# Patient Record
Sex: Male | Born: 1975
Health system: Southern US, Community
[De-identification: ages and names within clinical notes are randomized; demographics above are authoritative.]

## PROBLEM LIST (undated history)

## (undated) DIAGNOSIS — J45909 Unspecified asthma, uncomplicated: Secondary | ICD-10-CM

## (undated) DIAGNOSIS — N183 Chronic kidney disease, stage 3 unspecified: Secondary | ICD-10-CM

## (undated) DIAGNOSIS — D860 Sarcoidosis of lung: Secondary | ICD-10-CM

## (undated) DIAGNOSIS — D571 Sickle-cell disease without crisis: Secondary | ICD-10-CM

## (undated) DIAGNOSIS — I729 Aneurysm of unspecified site: Secondary | ICD-10-CM

## (undated) DIAGNOSIS — D869 Sarcoidosis, unspecified: Secondary | ICD-10-CM

## (undated) DIAGNOSIS — R011 Cardiac murmur, unspecified: Secondary | ICD-10-CM

## (undated) HISTORY — PX: LYMPH NODE BIOPSY: SHX201

## (undated) HISTORY — PX: NASAL SINUS SURGERY: SHX719

## (undated) HISTORY — DX: Sarcoidosis of lung: D86.0

## (undated) HISTORY — DX: Unspecified asthma, uncomplicated: J45.909

## (undated) HISTORY — DX: Chronic kidney disease, stage 3 unspecified: N18.30

## (undated) NOTE — *Deleted (*Deleted)
  Problem: Bowel/Gastric: Goal: Gut motility will be maintained Outcome: Progressing   Problem: Fluid Volume: Goal: Ability to maintain a balanced intake and output will improve Outcome: Progressing   

---

## 2006-07-08 ENCOUNTER — Emergency Department (HOSPITAL_COMMUNITY): Admission: AD | Admit: 2006-07-08 | Discharge: 2006-07-08 | Payer: Self-pay | Admitting: Family Medicine

## 2008-07-21 ENCOUNTER — Emergency Department (HOSPITAL_COMMUNITY): Admission: EM | Admit: 2008-07-21 | Discharge: 2008-07-21 | Payer: Self-pay | Admitting: Family Medicine

## 2010-10-25 ENCOUNTER — Observation Stay (HOSPITAL_COMMUNITY)
Admission: EM | Admit: 2010-10-25 | Discharge: 2010-10-26 | Disposition: A | Discharge status: Home / Self Care | Payer: Medicare Other | Attending: Emergency Medicine | Admitting: Emergency Medicine

## 2010-10-25 DIAGNOSIS — D5700 Hb-SS disease with crisis, unspecified: Principal | ICD-10-CM | POA: Insufficient documentation

## 2010-12-15 LAB — CBC
HCT: 21.1 % — ABNORMAL LOW (ref 39.0–52.0)
Platelets: 427 10*3/uL — ABNORMAL HIGH (ref 150–400)
RBC: 2.58 MIL/uL — ABNORMAL LOW (ref 4.22–5.81)
RDW: 21 % — ABNORMAL HIGH (ref 11.5–15.5)

## 2010-12-15 LAB — RETICULOCYTES
RBC.: 2.58 MIL/uL — ABNORMAL LOW (ref 4.22–5.81)
Retic Count, Absolute: 206.4 10*3/uL — ABNORMAL HIGH (ref 19.0–186.0)

## 2010-12-15 LAB — BASIC METABOLIC PANEL
Calcium: 8.8 mg/dL (ref 8.4–10.5)
Chloride: 105 mEq/L (ref 96–112)
Sodium: 138 mEq/L (ref 135–145)

## 2010-12-15 LAB — DIFFERENTIAL
Basophils Absolute: 0.1 10*3/uL (ref 0.0–0.1)
Lymphocytes Relative: 27 % (ref 12–46)
Lymphs Abs: 2.9 10*3/uL (ref 0.7–4.0)
Neutro Abs: 5.8 10*3/uL (ref 1.7–7.7)

## 2011-09-26 ENCOUNTER — Emergency Department (INDEPENDENT_AMBULATORY_CARE_PROVIDER_SITE_OTHER): Payer: Medicare Other

## 2011-09-26 ENCOUNTER — Emergency Department (INDEPENDENT_AMBULATORY_CARE_PROVIDER_SITE_OTHER)
Admission: EM | Admit: 2011-09-26 | Discharge: 2011-09-26 | Disposition: A | Discharge status: Home / Self Care | Payer: Medicare Other | Source: Home / Self Care | Attending: Family Medicine | Admitting: Family Medicine

## 2011-09-26 ENCOUNTER — Encounter: Payer: Self-pay | Admitting: *Deleted

## 2011-09-26 DIAGNOSIS — M79609 Pain in unspecified limb: Secondary | ICD-10-CM

## 2011-09-26 DIAGNOSIS — M79669 Pain in unspecified lower leg: Secondary | ICD-10-CM

## 2011-09-26 HISTORY — DX: Sickle-cell disease without crisis: D57.1

## 2011-09-26 MED ORDER — DICLOFENAC POTASSIUM 50 MG PO TABS: 50.0000 mg | ORAL_TABLET | Freq: Three times a day (TID) | ORAL | Status: AC

## 2011-09-26 NOTE — ED Provider Notes (Signed)
History     CSN: [id number]  Arrival date & time 09/26/11  1134   First MD Initiated Contact with Patient 09/26/11 1428      Chief Complaint  Patient presents with  . Leg Pain    (Consider location/radiation/quality/duration/timing/severity/associated sxs/prior treatment) Patient is a 35 y.o. male presenting with leg pain. The history is provided by the patient.  Leg Pain  The incident occurred more than 2 days ago. The incident occurred at home. There was no injury mechanism (pt has sickle cell diseae and cerebral aneurysm.). The pain is present in the right leg. The pain is moderate. The pain has been constant since onset. The symptoms are aggravated by nothing.    Past Medical History  Diagnosis Date  . Sickle cell anemia     History reviewed. No pertinent past surgical history.  History reviewed. No pertinent family history.  History  Substance Use Topics  . Smoking status: Not on file  . Smokeless tobacco: Not on file  . Alcohol Use:       Review of Systems  Constitutional: Negative.   Musculoskeletal: Positive for myalgias. Negative for joint swelling.  Skin: Negative for color change, pallor and rash.    Allergies  Review of patient's allergies indicates no known allergies.  Home Medications   Current Outpatient Rx  Name Route Sig Dispense Refill  . FOLIC ACID 1 MG PO TABS Oral Take 1 mg by mouth daily.      Marland Kitchen HYDROXYUREA 500 MG PO CAPS Oral Take 500 mg by mouth daily. May take with food to minimize GI side effects.     . OXYCODONE-ACETAMINOPHEN 5-325 MG PO TABS Oral Take 1 tablet by mouth every 4 (four) hours as needed.      Marland Kitchen DICLOFENAC POTASSIUM 50 MG PO TABS Oral Take 1 tablet (50 mg total) by mouth 3 (three) times daily. 30 tablet 1    BP 123/60  Pulse 59  Temp(Src) 98.1 F (36.7 C) (Oral)  Resp 20  SpO2 98%  Physical Exam  Nursing note and vitals reviewed. Constitutional: He is oriented to person, place, and time. He appears  well-developed and well-nourished.  HENT:  Head: Normocephalic.  Musculoskeletal: Normal range of motion. He exhibits tenderness.  Neurological: He is alert and oriented to person, place, and time.  Skin: Skin is warm and dry.  Psychiatric: He has a normal mood and affect.    ED Course  Procedures (including critical care time)  Labs Reviewed - No data to display Dg Tibia/fibula Right  09/26/2011  *RADIOLOGY REPORT*  Clinical Data: Pain, no known injury  RIGHT TIBIA AND FIBULA - 2 VIEW  Comparison: None.  Findings: Two views of the right tibia-fibula submitted.  No acute fracture or subluxation.  No periosteal reaction or bony erosion. No radiopaque foreign body.  IMPRESSION: No acute fracture or subluxation.  Original Report Authenticated By: Natasha Mead, M.D.     1. Lower leg pain       MDM  X-rays reviewed and report per radiologist.         Barkley Bruns, MD 09/26/11 [phone]

## 2011-09-26 NOTE — ED Notes (Signed)
Pt  Has  Sickle  Cell  He  Takes oxycodone    He  Reports  Pain r  Lower  Leg   X  4  Days  He  Is  Sitting  Comfortably on  Exam table  He  denys  Any  Injury

## 2013-10-11 DIAGNOSIS — R9431 Abnormal electrocardiogram [ECG] [EKG]: Secondary | ICD-10-CM | POA: Diagnosis not present

## 2013-11-01 DIAGNOSIS — D571 Sickle-cell disease without crisis: Secondary | ICD-10-CM | POA: Diagnosis not present

## 2013-11-01 DIAGNOSIS — I729 Aneurysm of unspecified site: Secondary | ICD-10-CM | POA: Diagnosis not present

## 2013-11-01 DIAGNOSIS — R17 Unspecified jaundice: Secondary | ICD-10-CM | POA: Diagnosis not present

## 2013-12-09 DIAGNOSIS — D5700 Hb-SS disease with crisis, unspecified: Secondary | ICD-10-CM | POA: Diagnosis not present

## 2013-12-09 DIAGNOSIS — D571 Sickle-cell disease without crisis: Secondary | ICD-10-CM | POA: Diagnosis not present

## 2014-02-25 DIAGNOSIS — Z79899 Other long term (current) drug therapy: Secondary | ICD-10-CM | POA: Diagnosis not present

## 2014-02-25 DIAGNOSIS — G43719 Chronic migraine without aura, intractable, without status migrainosus: Secondary | ICD-10-CM | POA: Diagnosis not present

## 2014-02-25 DIAGNOSIS — Z049 Encounter for examination and observation for unspecified reason: Secondary | ICD-10-CM | POA: Diagnosis not present

## 2014-03-04 DIAGNOSIS — L97909 Non-pressure chronic ulcer of unspecified part of unspecified lower leg with unspecified severity: Secondary | ICD-10-CM | POA: Diagnosis not present

## 2014-03-04 DIAGNOSIS — L678 Other hair color and hair shaft abnormalities: Secondary | ICD-10-CM | POA: Diagnosis not present

## 2014-03-04 DIAGNOSIS — D571 Sickle-cell disease without crisis: Secondary | ICD-10-CM | POA: Diagnosis not present

## 2014-03-04 DIAGNOSIS — L738 Other specified follicular disorders: Secondary | ICD-10-CM | POA: Diagnosis not present

## 2014-03-04 DIAGNOSIS — D5700 Hb-SS disease with crisis, unspecified: Secondary | ICD-10-CM | POA: Diagnosis not present

## 2014-03-04 DIAGNOSIS — G8929 Other chronic pain: Secondary | ICD-10-CM | POA: Diagnosis not present

## 2014-04-21 DIAGNOSIS — M549 Dorsalgia, unspecified: Secondary | ICD-10-CM | POA: Diagnosis not present

## 2014-04-21 DIAGNOSIS — R599 Enlarged lymph nodes, unspecified: Secondary | ICD-10-CM | POA: Diagnosis not present

## 2014-04-21 DIAGNOSIS — D571 Sickle-cell disease without crisis: Secondary | ICD-10-CM | POA: Diagnosis not present

## 2014-04-21 DIAGNOSIS — R944 Abnormal results of kidney function studies: Secondary | ICD-10-CM | POA: Diagnosis not present

## 2014-04-21 DIAGNOSIS — G8929 Other chronic pain: Secondary | ICD-10-CM | POA: Diagnosis not present

## 2014-04-21 DIAGNOSIS — R350 Frequency of micturition: Secondary | ICD-10-CM | POA: Diagnosis not present

## 2014-04-21 DIAGNOSIS — I517 Cardiomegaly: Secondary | ICD-10-CM | POA: Diagnosis not present

## 2014-04-21 DIAGNOSIS — E559 Vitamin D deficiency, unspecified: Secondary | ICD-10-CM | POA: Diagnosis not present

## 2014-04-21 DIAGNOSIS — J329 Chronic sinusitis, unspecified: Secondary | ICD-10-CM | POA: Diagnosis not present

## 2014-04-21 DIAGNOSIS — R51 Headache: Secondary | ICD-10-CM | POA: Diagnosis not present

## 2014-04-23 DIAGNOSIS — D571 Sickle-cell disease without crisis: Secondary | ICD-10-CM | POA: Diagnosis not present

## 2014-04-23 DIAGNOSIS — G8929 Other chronic pain: Secondary | ICD-10-CM | POA: Diagnosis not present

## 2014-04-23 DIAGNOSIS — L97909 Non-pressure chronic ulcer of unspecified part of unspecified lower leg with unspecified severity: Secondary | ICD-10-CM | POA: Diagnosis not present

## 2014-04-25 DIAGNOSIS — IMO0001 Reserved for inherently not codable concepts without codable children: Secondary | ICD-10-CM | POA: Diagnosis not present

## 2014-04-25 DIAGNOSIS — D571 Sickle-cell disease without crisis: Secondary | ICD-10-CM | POA: Diagnosis not present

## 2014-04-25 DIAGNOSIS — L97909 Non-pressure chronic ulcer of unspecified part of unspecified lower leg with unspecified severity: Secondary | ICD-10-CM | POA: Diagnosis not present

## 2014-04-25 DIAGNOSIS — D649 Anemia, unspecified: Secondary | ICD-10-CM | POA: Diagnosis not present

## 2014-04-25 DIAGNOSIS — G8929 Other chronic pain: Secondary | ICD-10-CM | POA: Diagnosis not present

## 2014-05-30 ENCOUNTER — Encounter (HOSPITAL_COMMUNITY): Payer: Self-pay | Admitting: Emergency Medicine

## 2014-05-30 ENCOUNTER — Emergency Department (HOSPITAL_COMMUNITY): Payer: Medicare Other

## 2014-05-30 ENCOUNTER — Inpatient Hospital Stay (HOSPITAL_COMMUNITY)
Admission: EM | Admit: 2014-05-30 | Discharge: 2014-06-01 | DRG: 812 | Disposition: A | Discharge status: Home / Self Care | Payer: Medicare Other | Attending: Internal Medicine | Admitting: Internal Medicine

## 2014-05-30 DIAGNOSIS — N179 Acute kidney failure, unspecified: Secondary | ICD-10-CM | POA: Diagnosis present

## 2014-05-30 DIAGNOSIS — R0602 Shortness of breath: Secondary | ICD-10-CM | POA: Diagnosis not present

## 2014-05-30 DIAGNOSIS — D5700 Hb-SS disease with crisis, unspecified: Secondary | ICD-10-CM

## 2014-05-30 DIAGNOSIS — D571 Sickle-cell disease without crisis: Secondary | ICD-10-CM | POA: Diagnosis not present

## 2014-05-30 DIAGNOSIS — G8929 Other chronic pain: Secondary | ICD-10-CM | POA: Diagnosis not present

## 2014-05-30 DIAGNOSIS — D589 Hereditary hemolytic anemia, unspecified: Secondary | ICD-10-CM

## 2014-05-30 DIAGNOSIS — R51 Headache: Secondary | ICD-10-CM | POA: Diagnosis not present

## 2014-05-30 DIAGNOSIS — N183 Chronic kidney disease, stage 3 unspecified: Secondary | ICD-10-CM | POA: Diagnosis not present

## 2014-05-30 DIAGNOSIS — D638 Anemia in other chronic diseases classified elsewhere: Secondary | ICD-10-CM

## 2014-05-30 DIAGNOSIS — Z79899 Other long term (current) drug therapy: Secondary | ICD-10-CM

## 2014-05-30 DIAGNOSIS — N189 Chronic kidney disease, unspecified: Secondary | ICD-10-CM | POA: Diagnosis present

## 2014-05-30 DIAGNOSIS — D649 Anemia, unspecified: Secondary | ICD-10-CM | POA: Diagnosis present

## 2014-05-30 LAB — COMPREHENSIVE METABOLIC PANEL WITH GFR
ALT: 40 U/L (ref 0–53)
AST: 100 U/L — ABNORMAL HIGH (ref 0–37)
Albumin: 3.5 g/dL (ref 3.5–5.2)
Alkaline Phosphatase: 146 U/L — ABNORMAL HIGH (ref 39–117)
Anion gap: 14 (ref 5–15)
BUN: 30 mg/dL — ABNORMAL HIGH (ref 6–23)
CO2: 20 meq/L (ref 19–32)
Calcium: 12.1 mg/dL — ABNORMAL HIGH (ref 8.4–10.5)
Chloride: 103 meq/L (ref 96–112)
Creatinine, Ser: 2.33 mg/dL — ABNORMAL HIGH (ref 0.50–1.35)
GFR calc Af Amer: 39 mL/min — ABNORMAL LOW (ref >90)
GFR calc non Af Amer: 34 mL/min — ABNORMAL LOW (ref >90)
Glucose, Bld: 97 mg/dL (ref 70–99)
Potassium: 3.9 meq/L (ref 3.7–5.3)
Sodium: 137 meq/L (ref 137–147)
Total Bilirubin: 3.1 mg/dL — ABNORMAL HIGH (ref 0.3–1.2)
Total Protein: 7.3 g/dL (ref 6.0–8.3)

## 2014-05-30 LAB — CBC
HCT: 14.5 % — ABNORMAL LOW (ref 39.0–52.0)
Hemoglobin: 4.8 g/dL — CL (ref 13.0–17.0)
MCH: 30.8 pg (ref 26.0–34.0)
MCHC: 33.1 g/dL (ref 30.0–36.0)
MCV: 92.9 fL (ref 78.0–100.0)
Platelets: 490 K/uL — ABNORMAL HIGH (ref 150–400)
RBC: 1.56 MIL/uL — ABNORMAL LOW (ref 4.22–5.81)
RDW: 22 % — ABNORMAL HIGH (ref 11.5–15.5)
WBC: 13.1 K/uL — ABNORMAL HIGH (ref 4.0–10.5)

## 2014-05-30 LAB — RETICULOCYTES
RBC.: 1.56 MIL/uL — ABNORMAL LOW (ref 4.22–5.81)
Retic Ct Pct: >23 % — ABNORMAL HIGH (ref 0.4–3.1)

## 2014-05-30 NOTE — ED Provider Notes (Signed)
CSN: [id number]     Arrival date & time 05/30/14  1808 History   First MD Initiated Contact with Patient 05/30/14 2133     Chief Complaint  Patient presents with  . Headache     (Consider location/radiation/quality/duration/timing/severity/associated sxs/prior Treatment) HPI Comments: He reports constant headache for the past year that has gotten worse in the last 3-4 months.  He reports having sickle cell disease which he is treated for at [facility]; No PCP.  He describes the headache as achiness on the top of his head.  The headache waxes and wanes in nature daily, but never fully resolves.  He takes medications daily for his sickle cell pain, as well as headaches: Percocet 6 daily; Excedrin,tylenol, Aleve. Additionaly he report being told he had a benign brain aneurysm in 2011. He denies any vision changes, weakness, facial weakness or speech difficulties. He reports this HA is similar to previous HAs but is getting worse.  Additionally, he endorses shortness of breath with walking which has been present for several months, as well as pain underneath his shoulders, and scapula.  He denies current pain crises.      Patient is a 38 y.o. male presenting with headaches.  Headache Pain location:  Generalized Quality: achy/sore. Radiates to:  Does not radiate Severity currently:  4/10 Severity at highest:  10/10 Timing:  Constant Progression:  Waxing and waning Chronicity:  Chronic Similar to prior headaches: yes   Context: activity, bright light and loud noise   Context: not caffeine   Relieved by:  Acetaminophen and NSAIDs (Percocet - 6 a day) Ineffective treatments:  NSAIDs and acetaminophen (excedrin) Associated symptoms: fatigue and weakness   Associated symptoms: no abdominal pain, no back pain, no blurred vision, no diarrhea, no dizziness, no nausea, no URI and no vomiting     Past Medical History  Diagnosis Date  . Sickle cell anemia    History reviewed. No pertinent past  surgical history. No family history on file. History  Substance Use Topics  . Smoking status: Never Smoker   . Smokeless tobacco: Not on file  . Alcohol Use: No    Review of Systems  Constitutional: Positive for fatigue.  Eyes: Negative for blurred vision.  Gastrointestinal: Negative for nausea, vomiting, abdominal pain and diarrhea.  Musculoskeletal: Negative for back pain.  Neurological: Positive for headaches. Negative for dizziness.    Allergies  Review of patient's allergies indicates no known allergies.  Home Medications   Prior to Admission medications   Medication Sig Start Date End Date Taking? Authorizing Provider  folic acid (FOLVITE) 1 MG tablet Take 1 mg by mouth daily.      Historical Provider, MD  hydroxyurea (HYDREA) 500 MG capsule Take 500 mg by mouth daily. May take with food to minimize GI side effects.     Historical Provider, MD  oxyCODONE-acetaminophen (PERCOCET) 5-325 MG per tablet Take 1 tablet by mouth every 4 (four) hours as needed.      Historical Provider, MD   BP 125/60  Pulse 88  Temp(Src) 98.2 F (36.8 C) (Oral)  Resp 16  Ht 6' (1.829 m)  Wt 136 lb (61.689 kg)  BMI 18.44 kg/m2  SpO2 92% Physical Exam  ED Course  Procedures (including critical care time) Labs Review Labs Reviewed  CBC - Abnormal; Notable for the following:    WBC 13.1 (*)    RBC 1.56 (*)    Hemoglobin 4.8 (*)    HCT 14.5 (*)    RDW 22.0 (*)  Platelets 490 (*)    All other components within normal limits  COMPREHENSIVE METABOLIC PANEL - Abnormal; Notable for the following:    BUN 30 (*)    Creatinine, Ser 2.33 (*)    Calcium 12.1 (*)    AST 100 (*)    Alkaline Phosphatase 146 (*)    Total Bilirubin 3.1 (*)    GFR calc non Af Amer 34 (*)    GFR calc Af Amer 39 (*)    All other components within normal limits  RETICULOCYTES - Abnormal; Notable for the following:    Retic Ct Pct >23.0 (*)    RBC. 1.56 (*)    All other components within normal limits  TYPE  AND SCREEN  PREPARE RBC (CROSSMATCH)    Imaging Review Dg Chest 2 View  05/31/2014   CLINICAL DATA:  Headaches for 3 months. Shortness of breath. Sickle cell anemia.  EXAM: CHEST  2 VIEW  COMPARISON:  09/03/2013  FINDINGS: Right paratracheal and bilateral hilar prominence suggesting mediastinal/hilar lymphadenopathy. Appearance is similar to previous study. Mild cardiac enlargement. No focal consolidation or airspace disease. No blunting of costophrenic angles. No pneumothorax.  IMPRESSION: Bilateral hilar and mediastinal lymphadenopathy is again demonstrated, similar to prior study. Cardiac enlargement. No focal consolidation.   Electronically Signed   By: [name] M.D.   On: 05/31/2014 00:01     EKG Interpretation None      MDM   Final diagnoses:  Hb-SS disease without crisis  Anemia in other chronic diseases classified elsewhere  CKD (chronic kidney disease), stage 3 (moderate)  Chronic intractable headache, unspecified headache type   Presented with chronic daily headache without neurological signs/symptoms as well as Hx of sickle cell anemia with subscapular pain and worsen SOB. CXR not concerning for Acute chest but demonstrating chronic hilar and mediastinal lymphadenopathy. Severe symptomatic anemia with Hgb 4.8 and high retic. Will admit for blood transfusion and further evaluation of anemia.     Jamal Collin, MD 05/31/14 [phone]

## 2014-05-30 NOTE — ED Notes (Signed)
Report received from [place], [place].  Pt in radiology.

## 2014-05-30 NOTE — ED Notes (Signed)
The pt  Is c/o a headache for 3 months.  He has been seen numerus times for the same and he is still having the headaches

## 2014-05-31 ENCOUNTER — Inpatient Hospital Stay (HOSPITAL_COMMUNITY): Payer: Medicare Other

## 2014-05-31 DIAGNOSIS — D571 Sickle-cell disease without crisis: Secondary | ICD-10-CM | POA: Diagnosis not present

## 2014-05-31 DIAGNOSIS — D649 Anemia, unspecified: Secondary | ICD-10-CM | POA: Diagnosis not present

## 2014-05-31 DIAGNOSIS — D599 Acquired hemolytic anemia, unspecified: Secondary | ICD-10-CM | POA: Diagnosis not present

## 2014-05-31 DIAGNOSIS — N189 Chronic kidney disease, unspecified: Secondary | ICD-10-CM | POA: Diagnosis present

## 2014-05-31 DIAGNOSIS — N183 Chronic kidney disease, stage 3 unspecified: Secondary | ICD-10-CM | POA: Diagnosis not present

## 2014-05-31 DIAGNOSIS — N179 Acute kidney failure, unspecified: Secondary | ICD-10-CM | POA: Diagnosis present

## 2014-05-31 DIAGNOSIS — R51 Headache: Secondary | ICD-10-CM | POA: Diagnosis not present

## 2014-05-31 DIAGNOSIS — Z79899 Other long term (current) drug therapy: Secondary | ICD-10-CM | POA: Diagnosis not present

## 2014-05-31 DIAGNOSIS — R0602 Shortness of breath: Secondary | ICD-10-CM | POA: Diagnosis not present

## 2014-05-31 DIAGNOSIS — R079 Chest pain, unspecified: Secondary | ICD-10-CM | POA: Diagnosis not present

## 2014-05-31 LAB — URINALYSIS, ROUTINE W REFLEX MICROSCOPIC
Bilirubin Urine: NEGATIVE
Glucose, UA: NEGATIVE mg/dL
KETONES UR: NEGATIVE mg/dL
NITRITE: NEGATIVE
PH: 6.5 (ref 5.0–8.0)
Protein, ur: NEGATIVE mg/dL
Specific Gravity, Urine: 1.009 (ref 1.005–1.030)
Urobilinogen, UA: 1 mg/dL (ref 0.0–1.0)

## 2014-05-31 LAB — CBC
HCT: 19.4 % — ABNORMAL LOW (ref 39.0–52.0)
HEMOGLOBIN: 6.9 g/dL — AB (ref 13.0–17.0)
MCH: 29.7 pg (ref 26.0–34.0)
MCHC: 35.6 g/dL (ref 30.0–36.0)
MCV: 83.6 fL (ref 78.0–100.0)
Platelets: 468 10*3/uL — ABNORMAL HIGH (ref 150–400)
RBC: 2.32 MIL/uL — AB (ref 4.22–5.81)
RDW: 18.8 % — ABNORMAL HIGH (ref 11.5–15.5)
WBC: 10.8 10*3/uL — AB (ref 4.0–10.5)

## 2014-05-31 LAB — BASIC METABOLIC PANEL
Anion gap: 13 (ref 5–15)
BUN: 25 mg/dL — ABNORMAL HIGH (ref 6–23)
CHLORIDE: 104 meq/L (ref 96–112)
CO2: 21 mEq/L (ref 19–32)
Calcium: 11.9 mg/dL — ABNORMAL HIGH (ref 8.4–10.5)
Creatinine, Ser: 2.25 mg/dL — ABNORMAL HIGH (ref 0.50–1.35)
GFR calc Af Amer: 41 mL/min — ABNORMAL LOW (ref >90)
GFR, EST NON AFRICAN AMERICAN: 35 mL/min — AB (ref >90)
GLUCOSE: 98 mg/dL (ref 70–99)
POTASSIUM: 4.1 meq/L (ref 3.7–5.3)
SODIUM: 138 meq/L (ref 137–147)

## 2014-05-31 LAB — URINE MICROSCOPIC-ADD ON

## 2014-05-31 LAB — PREPARE RBC (CROSSMATCH)

## 2014-05-31 MED ORDER — OXYCODONE-ACETAMINOPHEN 5-325 MG PO TABS
1.0000 | ORAL_TABLET | ORAL | Status: DC | PRN
  Administered 2014-05-31 (×3): 1 via ORAL
  Filled 2014-05-31 (×3): qty 1

## 2014-05-31 MED ORDER — SODIUM CHLORIDE 0.9 % IJ SOLN
3.0000 mL | Freq: Two times a day (BID) | INTRAMUSCULAR | Status: DC
  Administered 2014-05-31 (×2): 3 mL via INTRAVENOUS

## 2014-05-31 MED ORDER — SODIUM CHLORIDE 0.45 % IV SOLN
INTRAVENOUS | Status: AC
  Administered 2014-05-31 – 2014-06-01 (×2): via INTRAVENOUS

## 2014-05-31 MED ORDER — HYDROMORPHONE HCL PF 1 MG/ML IJ SOLN
0.5000 mg | INTRAMUSCULAR | Status: DC | PRN
  Administered 2014-05-31: 0.5 mg via INTRAVENOUS
  Filled 2014-05-31: qty 1

## 2014-05-31 MED ORDER — SODIUM CHLORIDE 0.9 % IV SOLN
Freq: Once | INTRAVENOUS | Status: AC
  Administered 2014-05-31: 01:00:00 via INTRAVENOUS

## 2014-05-31 MED ORDER — CETYLPYRIDINIUM CHLORIDE 0.05 % MT LIQD
7.0000 mL | Freq: Two times a day (BID) | OROMUCOSAL | Status: DC
  Administered 2014-05-31 (×2): 7 mL via OROMUCOSAL

## 2014-05-31 MED ORDER — HEPARIN SODIUM (PORCINE) 5000 UNIT/ML IJ SOLN
5000.0000 [IU] | Freq: Three times a day (TID) | INTRAMUSCULAR | Status: DC
  Administered 2014-05-31 – 2014-06-01 (×4): 5000 [IU] via SUBCUTANEOUS
  Filled 2014-05-31 (×7): qty 1

## 2014-05-31 NOTE — Plan of Care (Signed)
Problem: Phase I Progression Outcomes Goal: OOB as tolerated unless otherwise ordered Outcome: Completed/Met Date Met:  05/31/14 Pt oob to use urinal. Continue to monitor.

## 2014-05-31 NOTE — Progress Notes (Signed)
Pt ordered to transfer to Sickle Cell unit at [facility]. Bed received for 3W (1331). Report called to [place]. [facility] notified for transfer. Hortencia Conradi, RN

## 2014-05-31 NOTE — Care Management Note (Signed)
    Page 1 of 1   05/31/2014     11:48:58 AM CARE MANAGEMENT NOTE 05/31/2014  Patient:  TRUEMAN, WORLDS   Account Number:  [id number]  Date Initiated:  05/31/2014  Documentation initiated by:  [facility]  Subjective/Objective Assessment:   adm: HGB-SS disease     Action/Plan:   transfer to [facility]   Anticipated DC Date:     Anticipated DC Plan:           Choice offered to / List presented to:             Status of service:  Completed, signed off Medicare Important Message given?   (If response is "NO", the following Medicare IM given date fields will be blank) Date Medicare IM given:   Medicare IM given by:   Date Additional Medicare IM given:   Additional Medicare IM given by:    Discharge Disposition:    Per UR Regulation:    If discussed at Long Length of Stay Meetings, dates discussed:    Comments:  05/31/14 11:35 CM received call from RN as pt is traNSFERING TO [facility].  CM advised RN to call report to receiving RN.  and then to call [facility] to arrange transport.  CM gave RN Care demographic info.  No other CM needs were communicated.  Freddy Jaksch, BSN, CM [phone].

## 2014-05-31 NOTE — ED Provider Notes (Signed)
I saw and evaluated the patient, reviewed the resident's note and I agree with the findings and plan.   EKG Interpretation None        CRITICAL CARE Performed by: Pricilla Loveless T   Total critical care time: 30 minutes  Critical care time was exclusive of separately billable procedures and treating other patients.  Critical care was necessary to treat or prevent imminent or life-threatening deterioration.  Critical care was time spent personally by me on the following activities: development of treatment plan with patient and/or surrogate as well as nursing, discussions with consultants, evaluation of patient's response to treatment, examination of patient, obtaining history from patient or surrogate, ordering and performing treatments and interventions, ordering and review of laboratory studies, ordering and review of radiographic studies, pulse oximetry and re-evaluation of patient's condition.    Patient's headache has been daily, intermittent and for months. No acute onset or acute worsening concerning for [facility]. Normal exam, no neuro symptoms. Is fatigued, and hgb 4.8 here, will order transfusion and admit to hospitalist.  Audree Camel, MD 05/31/14 [phone]

## 2014-05-31 NOTE — Plan of Care (Signed)
Problem: Phase I Progression Outcomes Goal: Voiding-avoid urinary catheter unless indicated Outcome: Completed/Met Date Met:  05/31/14 Pt voided 925 cc clear yellow urine. No reported problems.

## 2014-05-31 NOTE — H&P (Addendum)
[facility] History and Physical  Fran Mcree RUE:[id number] DOB: [birth year] DOA: 05/30/2014  Referring physician: EDP PCP: No primary provider on file.   Chief Complaint: Headache   HPI: Shawn Adams is a 38 y.o. male with h/o HGB-SS disease, follows with hematology at [facility].  He presents to ED with chronic headache for past year that has worsened in last 3-4 months.  Headache is daily and does not improve with NSAIDS.  Over the last couple of weeks he has also had generalized weakness, malaise, fatigue.  No chest pain, no acute pain crisis he says with his sickle cell disease, just severe fatigue.  Work up in the ED is remarkable for HGB of 4.8 today and AKI with creatinine of 2.3.  Of note he just had a similar presentation with HGB of 5.5 and creatinine of 2.1 on 7/27 requiring transfusion.  He had been on monthly-transfusion protocol with heme at [facility] but this was stopped for unclear reasons.  Review of Systems: No BRBPR, no melena, no vomiting, no other source of bleeding.  Systems reviewed.  As above, otherwise negative  Past Medical History  Diagnosis Date  . Sickle cell anemia    History reviewed. No pertinent past surgical history. Social History:  reports that he has never smoked. He does not have any smokeless tobacco history on file. He reports that he does not drink alcohol. His drug history is not on file.  No Known Allergies  No family history on file.   Prior to Admission medications   Medication Sig Start Date End Date Taking? Authorizing Provider  Multiple Vitamins-Minerals (MULTIVITAMIN WITH MINERALS) tablet Take 1 tablet by mouth daily.   Yes Historical Provider, MD  oxyCODONE-acetaminophen (PERCOCET) 5-325 MG per tablet Take 1 tablet by mouth every 4 (four) hours as needed for moderate pain.    Yes Historical Provider, MD   Physical Exam: Filed Vitals:   05/31/14 0115  BP: 121/64  Pulse: 89  Temp:   Resp:     BP 121/64  Pulse 89  Temp(Src)  98.2 F (36.8 C) (Oral)  Resp 16  Ht 6' (1.829 m)  Wt 61.689 kg (136 lb)  BMI 18.44 kg/m2  SpO2 92%  General Appearance:    Alert, oriented, no distress, appears stated age  Head:    Normocephalic, atraumatic  Eyes:    PERRL, EOMI, sclera non-icteric        Nose:   Nares without drainage or epistaxis. Mucosa, turbinates normal  Throat:   Moist mucous membranes. Oropharynx without erythema or exudate.  Neck:   Supple. No carotid bruits.  No thyromegaly.  No lymphadenopathy.   Back:     No CVA tenderness, no spinal tenderness  Lungs:     Clear to auscultation bilaterally, without wheezes, rhonchi or rales  Chest wall:    No tenderness to palpitation  Heart:    Regular rate and rhythm without murmurs, gallops, rubs  Abdomen:     Soft, non-tender, nondistended, normal bowel sounds, no organomegaly  Genitalia:    deferred  Rectal:    deferred  Extremities:   No clubbing, cyanosis or edema.  Pulses:   2+ and symmetric all extremities  Skin:   Skin color, texture, turgor normal, no rashes or lesions  Lymph nodes:   Cervical, supraclavicular, and axillary nodes normal  Neurologic:   CNII-XII intact. Normal strength, sensation and reflexes      throughout    Labs on Admission:  Basic Metabolic Panel:  Recent Labs  Lab 05/30/14 2312  NA 137  K 3.9  CL 103  CO2 20  GLUCOSE 97  BUN 30*  CREATININE 2.33*  CALCIUM 12.1*   Liver Function Tests:  Recent Labs Lab 05/30/14 2312  AST 100*  ALT 40  ALKPHOS 146*  BILITOT 3.1*  PROT 7.3  ALBUMIN 3.5   No results found for this basename: LIPASE, AMYLASE,  in the last 168 hours No results found for this basename: AMMONIA,  in the last 168 hours CBC:  Recent Labs Lab 05/30/14 2312  WBC 13.1*  HGB 4.8*  HCT 14.5*  MCV 92.9  PLT 490*   Cardiac Enzymes: No results found for this basename: CKTOTAL, CKMB, CKMBINDEX, TROPONINI,  in the last 168 hours  BNP (last 3 results) No results found for this basename: PROBNP,  in the  last 8760 hours CBG: No results found for this basename: GLUCAP,  in the last 168 hours  Radiological Exams on Admission: Dg Chest 2 View  05/31/2014   CLINICAL DATA:  Headaches for 3 months. Shortness of breath. Sickle cell anemia.  EXAM: CHEST  2 VIEW  COMPARISON:  09/03/2013  FINDINGS: Right paratracheal and bilateral hilar prominence suggesting mediastinal/hilar lymphadenopathy. Appearance is similar to previous study. Mild cardiac enlargement. No focal consolidation or airspace disease. No blunting of costophrenic angles. No pneumothorax.  IMPRESSION: Bilateral hilar and mediastinal lymphadenopathy is again demonstrated, similar to prior study. Cardiac enlargement. No focal consolidation.   Electronically Signed   By: [name] M.D.   On: 05/31/2014 00:01    EKG: Independently reviewed.  Assessment/Plan Principal Problem:   Symptomatic anemia Active Problems:   Sickle cell anemia without crisis   AKI (acute kidney injury)   1. Symptomatic anemia from HGB-SS disease without acute pain crisis - Despite what appears to be hemolytic anemia from HGB-SS disease, patient is actually not in pain crisis and denies pain. 1. Transfuse 2 units PRBC 2. Recheck CBC in AM. 3. Tele monitor 4. Needs to follow up with Heme, suspect he may need to be back on monthly transfusion protocol. 5. Appears to be hyperhemolytic crisis as erythrocytes are elevated making aplastic crisis unlikely.  Splenic sequestration crisis also very unlikely at his age. 1. Will measure G6PD to make sure there isnt also a deficiency in this. 2. Could alternatively represent aplastic crisis during period of resolving reticulocytosis. 3. Could alternatively reflect a delayed hemolysis of transfused blood received at the end of July. 2. AKI - secondary to #1, recheck BMP in AM.    Code Status: Full Code  Family Communication: Family at bedside Disposition Plan: Admit to obs   Time spent: 70 min  GARDNER, JARED  M. [facility] Pager [phone]  If 7AM-7PM, please contact the day team taking care of the patient [email or web address] Password [email or web address] 05/31/2014, 1:37 AM

## 2014-05-31 NOTE — Plan of Care (Signed)
Problem: Phase I Progression Outcomes Goal: Hemodynamically stable Outcome: Completed/Met Date Met:  05/31/14 VSS. o2 sat 92% RA. Dyspnea controlled at rest.

## 2014-05-31 NOTE — Plan of Care (Signed)
Problem: Consults Goal: General Medical Patient Education See Patient Education Module for specific education.  Outcome: Completed/Met Date Met:  05/31/14 Educated about blood transfusion, close monitoring for potential transfusion reaction, and frequent vital signs.

## 2014-05-31 NOTE — Progress Notes (Signed)
Patient Demographics  Shawn Adams, is a 38 y.o. male, DOB - 11-18-1975, ZOX:[id number]  Admit date - 05/30/2014   Admitting Physician Hillary Bow, DO  Outpatient Primary MD for the patient is OSEI-BONSU,GEORGE, MD  LOS - 1   Chief Complaint  Patient presents with  . Headache       Shawn Adams is a 38 y.o. male with h/o HGB-SS disease, follows with hematology at [facility]. He presents to ED with chronic headache for past year that has worsened in last 3-4 months. Headache is daily and does not improve with NSAIDS. Over the last couple of weeks he has also had generalized weakness, malaise, fatigue. No chest pain, no acute pain crisis he says with his sickle cell disease, just severe fatigue. Was noticed to have hemoglobin dropped to 4.8 from 7.3 baseline, has worsening renal failure usually normal creatinine, but over the last few readings once compared even at [facility] since January seems to be elevated from baseline of 1 to 2.1.  Subjective:   Shawn Adams today has,  No abdominal pain - No Nausea,, No Cough - SOB. Complains of new onset chest pain and right-sided this morning, headache improved.  Assessment & Plan    Principal Problem:   Symptomatic anemia Active Problems:   Sickle cell anemia without crisis   AKI (acute kidney injury)  1-Symptomatic anemia from HGB-SS disease without acute pain crisis - Despite what appears to be hemolytic anemia from HGB-SS disease, patient is actually not in pain crisis and denies pain. -Being transfused 2 units PRBC, -Given he started to complain of chest pain will start him on oxygen to keep O2 sat 100%, will keep on incentive spirometry, denies any fever any chills, no indication for antibiotics yet, but will repeat his x-ray. -Patient will be transferred to [facility] long sickle cell service, discussed with Dr. Marthann Schiller 2- acute renal failure:  Will continue with aggressive IV fluid resuscitation, will keep on 0.45 NS, will recheck BMP in a.m..  Code Status: Full  Family Communication: patient and family at bedside  Disposition Plan: Home   Procedures  2 units of packed red blood cell transfusion September 5   Consults   None   Medications  Scheduled Meds: . antiseptic oral rinse  7 mL Mouth Rinse BID  . heparin  5,000 Units Subcutaneous 3 times per day  . sodium chloride  3 mL Intravenous Q12H   Continuous Infusions: . sodium chloride     PRN Meds:.HYDROmorphone (DILAUDID) injection, oxyCODONE-acetaminophen  DVT Prophylaxis   Heparin  Lab Results  Component Value Date   PLT 490* 05/30/2014    Antibiotics   None  Anti-infectives   None          Objective:   Filed Vitals:   05/31/14 0839 05/31/14 0904 05/31/14 0939 05/31/14 0941  BP: 111/57 118/57 117/56   Pulse: 72 78 76   Temp: 97.7 F (36.5 C) 97.8 F (36.6 C) 98 F (36.7 C)   TempSrc: Oral Oral Oral   Resp: Height:      Weight:      SpO2: 99% 95% 92% 98%    Wt Readings from Last 3 Encounters:  05/31/14 60.374 kg (133  lb 1.6 oz)     Intake/Output Summary (Last 24 hours) at 05/31/14 1023 Last data filed at 05/31/14 0939  Gross per 24 hour  Intake  462.5 ml  Output    925 ml  Net -462.5 ml     Physical Exam  Awake Alert, Oriented X 3, No new F.N deficits, Normal affect [place].AT,PERRAL, pale conjunctiva. Supple Neck,No JVD, No cervical lymphadenopathy appriciated.  Symmetrical Chest wall movement, Good air movement bilaterally, CTAB RRR,No Gallops,Rubs or new Murmurs, No Parasternal Heave +ve B.Sounds, Abd Soft, No tenderness, No rebound - guarding or rigidity. Mild hepatomegaly appreciated. No Cyanosis, Clubbing or edema, No new Rash or bruise     Data Review   Micro Results No results found for this or any previous visit (from the past 240 hour(s)).  Radiology Reports Dg  Chest 2 View  05/31/2014   CLINICAL DATA:  Headaches for 3 months. Shortness of breath. Sickle cell anemia.  EXAM: CHEST  2 VIEW  COMPARISON:  09/03/2013  FINDINGS: Right paratracheal and bilateral hilar prominence suggesting mediastinal/hilar lymphadenopathy. Appearance is similar to previous study. Mild cardiac enlargement. No focal consolidation or airspace disease. No blunting of costophrenic angles. No pneumothorax.  IMPRESSION: Bilateral hilar and mediastinal lymphadenopathy is again demonstrated, similar to prior study. Cardiac enlargement. No focal consolidation.   Electronically Signed   By: [name] M.D.   On: 05/31/2014 00:01    CBC  Recent Labs Lab 05/30/14 2312  WBC 13.1*  HGB 4.8*  HCT 14.5*  PLT 490*  MCV 92.9  MCH 30.8  MCHC 33.1  RDW 22.0*    Chemistries   Recent Labs Lab 05/30/14 2312  NA 137  K 3.9  CL 103  CO2 20  GLUCOSE 97  BUN 30*  CREATININE 2.33*  CALCIUM 12.1*  AST 100*  ALT 40  ALKPHOS 146*  BILITOT 3.1*   ------------------------------------------------------------------------------------------------------------------ estimated creatinine clearance is 37.1 ml/min (by C-G formula based on Cr of 2.33). ------------------------------------------------------------------------------------------------------------------ No results found for this basename: HGBA1C,  in the last 72 hours ------------------------------------------------------------------------------------------------------------------ No results found for this basename: CHOL, HDL, LDLCALC, TRIG, CHOLHDL, LDLDIRECT,  in the last 72 hours ------------------------------------------------------------------------------------------------------------------ No results found for this basename: TSH, T4TOTAL, FREET3, T3FREE, THYROIDAB,  in the last 72 hours ------------------------------------------------------------------------------------------------------------------  Recent Labs   05/30/14 2312  RETICCTPCT >23.0*    Coagulation profile No results found for this basename: INR, PROTIME,  in the last 168 hours  No results found for this basename: DDIMER,  in the last 72 hours  Cardiac Enzymes No results found for this basename: CK, CKMB, TROPONINI, MYOGLOBIN,  in the last 168 hours ------------------------------------------------------------------------------------------------------------------ No components found with this basename: POCBNP,      Time Spent in minutes   25 minutes   Rosabel Sermeno M.D on 05/31/2014 at 10:23 AM  Between 7am to 7pm - Pager - [phone]  After 7pm go to [email or web address] - password [email or web address]  And look for the night coverage person covering for me after hours  [facility] Office  [phone]   **Disclaimer: This note may have been dictated with voice recognition software. Similar sounding words can inadvertently be transcribed and this note may contain transcription errors which may not have been corrected upon publication of note.**

## 2014-05-31 NOTE — Plan of Care (Signed)
Problem: Phase I Progression Outcomes Goal: Pain controlled with appropriate interventions Outcome: Completed/Met Date Met:  05/31/14 Back pain reduced to 2/10 with 1 percocet during the night. Will continue to monitor.

## 2014-05-31 NOTE — Plan of Care (Signed)
Problem: Phase I Progression Outcomes Goal: Initial discharge plan identified Outcome: Completed/Met Date Met:  05/31/14 Pt to return home to community.

## 2014-06-01 DIAGNOSIS — N183 Chronic kidney disease, stage 3 unspecified: Secondary | ICD-10-CM

## 2014-06-01 DIAGNOSIS — D599 Acquired hemolytic anemia, unspecified: Secondary | ICD-10-CM

## 2014-06-01 DIAGNOSIS — N179 Acute kidney failure, unspecified: Secondary | ICD-10-CM

## 2014-06-01 DIAGNOSIS — D571 Sickle-cell disease without crisis: Principal | ICD-10-CM

## 2014-06-01 LAB — LACTATE DEHYDROGENASE: LDH: 463 U/L — ABNORMAL HIGH (ref 94–250)

## 2014-06-01 LAB — CBC
HEMATOCRIT: 18.9 % — AB (ref 39.0–52.0)
Hemoglobin: 6.8 g/dL — CL (ref 13.0–17.0)
MCH: 30 pg (ref 26.0–34.0)
MCHC: 36 g/dL (ref 30.0–36.0)
MCV: 83.3 fL (ref 78.0–100.0)
Platelets: 453 10*3/uL — ABNORMAL HIGH (ref 150–400)
RBC: 2.27 MIL/uL — ABNORMAL LOW (ref 4.22–5.81)
RDW: 19.2 % — ABNORMAL HIGH (ref 11.5–15.5)
WBC: 12.9 10*3/uL — ABNORMAL HIGH (ref 4.0–10.5)

## 2014-06-01 LAB — BASIC METABOLIC PANEL
Anion gap: 13 (ref 5–15)
BUN: 26 mg/dL — AB (ref 6–23)
CO2: 21 mEq/L (ref 19–32)
CREATININE: 2.34 mg/dL — AB (ref 0.50–1.35)
Calcium: 12.3 mg/dL — ABNORMAL HIGH (ref 8.4–10.5)
Chloride: 105 mEq/L (ref 96–112)
GFR calc Af Amer: 39 mL/min — ABNORMAL LOW (ref >90)
GFR calc non Af Amer: 34 mL/min — ABNORMAL LOW (ref >90)
Glucose, Bld: 93 mg/dL (ref 70–99)
Potassium: 4 mEq/L (ref 3.7–5.3)
Sodium: 139 mEq/L (ref 137–147)

## 2014-06-01 NOTE — Progress Notes (Signed)
Desaturation screen performed, standing RA O2 sat 100%, ambulatory room air sat 97%.

## 2014-06-01 NOTE — Discharge Summary (Signed)
Shawn Adams MRN: [id number] DOB/AGE: 1975/10/23 38 y.o.  Admit date: 05/30/2014 Discharge date: 06/01/2014  Primary Care Physician:  Jackie Plum, MD   Discharge Diagnoses:   Patient Active Problem List   Diagnosis Date Noted  . Sickle cell anemia without crisis 05/31/2014  . Symptomatic anemia 05/31/2014  . AKI (acute kidney injury) 05/31/2014    DISCHARGE MEDICATION:   Medication List         folic acid 1 MG tablet  Commonly known as:  FOLVITE  Take 1 mg by mouth daily. Added to Med Rec 9/6 per patient update, MD aware     multivitamin with minerals tablet  Take 1 tablet by mouth daily.     oxyCODONE-acetaminophen 5-325 MG per tablet  Commonly known as:  PERCOCET/ROXICET  Take 1 tablet by mouth every 4 (four) hours as needed for moderate pain.          Consults:     SIGNIFICANT DIAGNOSTIC STUDIES:  Dg Chest 2 View  05/31/2014   CLINICAL DATA:  Headaches for 3 months. Shortness of breath. Sickle cell anemia.  EXAM: CHEST  2 VIEW  COMPARISON:  09/03/2013  FINDINGS: Right paratracheal and bilateral hilar prominence suggesting mediastinal/hilar lymphadenopathy. Appearance is similar to previous study. Mild cardiac enlargement. No focal consolidation or airspace disease. No blunting of costophrenic angles. No pneumothorax.  IMPRESSION: Bilateral hilar and mediastinal lymphadenopathy is again demonstrated, similar to prior study. Cardiac enlargement. No focal consolidation.   Electronically Signed   By: [name] M.D.   On: 05/31/2014 00:01   Dg Chest Port 1 View  05/31/2014   CLINICAL DATA:  Right chest pain, shortness of breath, sickle cell  EXAM: PORTABLE CHEST - 1 VIEW  COMPARISON:  Chest radiograph dated 05/30/2014. CT chest dated 09/04/2013.  FINDINGS: Lungs are essentially clear. No focal consolidation. No pleural effusion or pneumothorax.  Cardiomegaly.  Mediastinal/hilar lymphadenopathy, unchanged.  IMPRESSION: Mediastinal/ hilar lymphadenopathy, unchanged.    Electronically Signed   By: Charline Bills M.D.   On: 05/31/2014 10:54     No results found for this or any previous visit (from the past 240 hour(s)).  BRIEF ADMITTING H & P: Pt with SCD ( unsure about SS vs [place]) who presented to the ED wit  H/S and was found to have a Hb of 4.8. He was recently discharged from [facility] where he sees his Hematologist on 04/25/2014. At that time his Hb was 6.8. Pt reports that he has been receiving transfusions at a rate of about 1 x month (not red cell pheresis) but has not been on Hydroxyurea. He states that he is compliant with Folic Acid.   He states that he noticed a jaundiced appearance about 4 days ago then started feeling fatigued and subsequently a headache occurred. The headache was persistent which prompted a visit to the ED.    Hospital Course: .  Present on Admission:  . Symptomatic anemia: Hemolytic Anemia Associated with Sickle Cell Disease: Pt was transfused 2 units PRBC and at the time of discharge his Hb was at baseline of 6.8. Pt;s headache resolved after transfusion and he had no further pain.   . Sickle cell anemia without crisis: Pt is not currently not on Hydrea and I had a long conversation with patient about the complications associated with hemolytic anemia in SCD and the importance of Hydrea use.   Marland Kitchen AKI (acute kidney injury) on CKD Stage III: A review of his records shows that 0n 03/04/2014,  he had normal renal function. However, since 04/21/2014 he has had decreased renal function with Cr at 2.1. Pt states that he has been taking aleve and some OTC vitamin. I have advised him to discontinue Aleve or any NSAIDS and the OTC vitamin. I have also asked him to request a referral from either his  PMD:Dr. Osei-Bonsu or his Hematologist: Dr. Jolyne Loa.  His renal function is stable but elevated from 2 months ago.His renal function needs to be closely followed and he should have his renal function evaluated in the next  week.  . 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 He reports that he has been noticing over the last 6 months that he has had problems with remembering things. He demonstrated normal 3 item recall. However, I have recommended that he follow up with his PMD to address this issue.  Disposition and Follow-up:  Pt stable at time of discharge. Pt to follow up with PMD in 3-5 days.     Discharge Instructions   Activity as tolerated - No restrictions    Complete by:  As directed      Diet general    Complete by:  As directed            DISCHARGE EXAM:  General: Alert, awake, oriented x3, in no acute distress.  Vital Signs: BP 113/59, HR 78, T 98.1 F (36.7 C), temperature source Oral, RR 16, height 6' (1.829 m), weight 130 lb 4.7 oz (59.1 kg), SpO2 97.00%. HEENT: [place]/AT PEERL, EOMI, mild icterus and muddy sclera.  Neck: Trachea midline, no masses, no thyromegal,y no JVD, no carotid bruit  OROPHARYNX: Moist, No exudate/ erythema/lesions.  Heart: Regular rate and rhythm, II/VI SEM murmur at base. No  rubs, gallops, PMI non-displaced, no heaves or thrills on palpation.  Lungs: Clear to auscultation, no wheezing or rhonchi noted. No increased vocal fremitus resonant to percussion  Abdomen: Soft, nontender, nondistended, positive bowel sounds, no masses no hepatosplenomegaly noted.  Neuro: No focal neurological deficits noted cranial nerves II through XII grossly intact. DTRs 2+ bilaterally upper and lower extremities. Strength normal in bilateral upper and lower extremities.  Musculoskeletal: No warm swelling or erythema around joints, no spinal tenderness noted.  Psychiatric: Patient alert and oriented x3, good insight and cognition, good recent to remote recall.  Lymph node survey: No cervical axillary or inguinal lymphadenopathy noted.    Recent Labs  05/31/14 1225 06/01/14 0434  NA 138 139  K 4.1 4.0  CL 104 105  CO2 21 21  GLUCOSE 98 93   BUN 25* 26*  CREATININE 2.25* 2.34*  CALCIUM 11.9* 12.3*    Recent Labs  05/30/14 2312  AST 100*  ALT 40  ALKPHOS 146*  BILITOT 3.1*  PROT 7.3  ALBUMIN 3.5   No results found for this basename: LIPASE, AMYLASE,  in the last 72 hours  Recent Labs  05/31/14 1225 06/01/14 0434  WBC 10.8* 12.9*  HGB 6.9* 6.8*  HCT 19.4* 18.9*  MCV 83.6 83.3  PLT 468* 453*   Total time for discharge including decision making and face to face time was greater than 30 minutes.  Signed: MATTHEWS,MICHELLE A. 06/01/2014, 11:33 AM

## 2014-06-01 NOTE — Progress Notes (Signed)
Patient discharged home, all discharge medications and instructions reviewed and questions answered.  Patient encouraged to call for wheelchair assistance when ride arrives.  

## 2014-06-02 LAB — TYPE AND SCREEN
ABO/RH(D): B POS
Antibody Screen: NEGATIVE
DAT, IGG: NEGATIVE
DONOR AG TYPE: NEGATIVE
Donor AG Type: NEGATIVE
UNIT DIVISION: 0
UNIT DIVISION: 0

## 2014-06-02 LAB — GLUCOSE 6 PHOSPHATE DEHYDROGENASE: G6PDH: 21 U/g{Hb} — AB (ref 7.0–20.5)

## 2014-06-20 ENCOUNTER — Encounter (HOSPITAL_COMMUNITY): Payer: Self-pay | Admitting: Emergency Medicine

## 2014-06-20 ENCOUNTER — Inpatient Hospital Stay (HOSPITAL_COMMUNITY)
Admission: EM | Admit: 2014-06-20 | Discharge: 2014-06-23 | DRG: 812 | Disposition: A | Discharge status: Home / Self Care | Payer: Medicare Other | Attending: Internal Medicine | Admitting: Internal Medicine

## 2014-06-20 ENCOUNTER — Emergency Department (HOSPITAL_COMMUNITY): Payer: Medicare Other

## 2014-06-20 DIAGNOSIS — R112 Nausea with vomiting, unspecified: Secondary | ICD-10-CM | POA: Diagnosis not present

## 2014-06-20 DIAGNOSIS — E860 Dehydration: Secondary | ICD-10-CM | POA: Diagnosis present

## 2014-06-20 DIAGNOSIS — D649 Anemia, unspecified: Secondary | ICD-10-CM | POA: Diagnosis not present

## 2014-06-20 DIAGNOSIS — Z23 Encounter for immunization: Secondary | ICD-10-CM | POA: Diagnosis not present

## 2014-06-20 DIAGNOSIS — D571 Sickle-cell disease without crisis: Secondary | ICD-10-CM | POA: Diagnosis present

## 2014-06-20 DIAGNOSIS — D5700 Hb-SS disease with crisis, unspecified: Principal | ICD-10-CM | POA: Diagnosis present

## 2014-06-20 DIAGNOSIS — Z79899 Other long term (current) drug therapy: Secondary | ICD-10-CM

## 2014-06-20 DIAGNOSIS — M545 Low back pain, unspecified: Secondary | ICD-10-CM | POA: Diagnosis not present

## 2014-06-20 DIAGNOSIS — R079 Chest pain, unspecified: Secondary | ICD-10-CM | POA: Diagnosis not present

## 2014-06-20 DIAGNOSIS — N183 Chronic kidney disease, stage 3 unspecified: Secondary | ICD-10-CM | POA: Diagnosis present

## 2014-06-20 DIAGNOSIS — R0602 Shortness of breath: Secondary | ICD-10-CM | POA: Diagnosis not present

## 2014-06-20 DIAGNOSIS — D72829 Elevated white blood cell count, unspecified: Secondary | ICD-10-CM | POA: Diagnosis present

## 2014-06-20 DIAGNOSIS — N182 Chronic kidney disease, stage 2 (mild): Secondary | ICD-10-CM | POA: Diagnosis present

## 2014-06-20 DIAGNOSIS — R599 Enlarged lymph nodes, unspecified: Secondary | ICD-10-CM | POA: Diagnosis not present

## 2014-06-20 DIAGNOSIS — R110 Nausea: Secondary | ICD-10-CM | POA: Diagnosis not present

## 2014-06-20 DIAGNOSIS — N179 Acute kidney failure, unspecified: Secondary | ICD-10-CM | POA: Diagnosis present

## 2014-06-20 HISTORY — DX: Aneurysm of unspecified site: I72.9

## 2014-06-20 LAB — ABO/RH: ABO/RH(D): B POS

## 2014-06-20 LAB — COMPREHENSIVE METABOLIC PANEL
ALT: 49 U/L (ref 0–53)
AST: 76 U/L — ABNORMAL HIGH (ref 0–37)
Albumin: 3.8 g/dL (ref 3.5–5.2)
Alkaline Phosphatase: 159 U/L — ABNORMAL HIGH (ref 39–117)
Anion gap: 14 (ref 5–15)
BUN: 27 mg/dL — ABNORMAL HIGH (ref 6–23)
CO2: 21 mEq/L (ref 19–32)
Calcium: 13.2 mg/dL (ref 8.4–10.5)
Chloride: 100 mEq/L (ref 96–112)
Creatinine, Ser: 2.71 mg/dL — ABNORMAL HIGH (ref 0.50–1.35)
GFR calc Af Amer: 33 mL/min — ABNORMAL LOW (ref >90)
GFR calc non Af Amer: 28 mL/min — ABNORMAL LOW (ref >90)
Glucose, Bld: 94 mg/dL (ref 70–99)
Potassium: 4 mEq/L (ref 3.7–5.3)
Sodium: 135 mEq/L — ABNORMAL LOW (ref 137–147)
Total Bilirubin: 3.3 mg/dL — ABNORMAL HIGH (ref 0.3–1.2)
Total Protein: 8.2 g/dL (ref 6.0–8.3)

## 2014-06-20 LAB — CBC WITH DIFFERENTIAL/PLATELET
Basophils Absolute: 0 10*3/uL (ref 0.0–0.1)
Basophils Relative: 0 % (ref 0–1)
Eosinophils Absolute: 0.5 10*3/uL (ref 0.0–0.7)
Eosinophils Relative: 3 % (ref 0–5)
HCT: 15.2 % — ABNORMAL LOW (ref 39.0–52.0)
Hemoglobin: 5.2 g/dL — CL (ref 13.0–17.0)
Lymphocytes Relative: 27 % (ref 12–46)
Lymphs Abs: 4.5 10*3/uL — ABNORMAL HIGH (ref 0.7–4.0)
MCH: 28.7 pg (ref 26.0–34.0)
MCHC: 34.2 g/dL (ref 30.0–36.0)
MCV: 84 fL (ref 78.0–100.0)
Monocytes Absolute: 1.5 10*3/uL — ABNORMAL HIGH (ref 0.1–1.0)
Monocytes Relative: 9 % (ref 3–12)
Neutro Abs: 10.1 10*3/uL — ABNORMAL HIGH (ref 1.7–7.7)
Neutrophils Relative %: 61 % (ref 43–77)
Platelets: 766 10*3/uL — ABNORMAL HIGH (ref 150–400)
RBC: 1.81 MIL/uL — ABNORMAL LOW (ref 4.22–5.81)
RDW: 19.5 % — ABNORMAL HIGH (ref 11.5–15.5)
WBC: 16.6 10*3/uL — ABNORMAL HIGH (ref 4.0–10.5)

## 2014-06-20 LAB — URINALYSIS, ROUTINE W REFLEX MICROSCOPIC
Bilirubin Urine: NEGATIVE
GLUCOSE, UA: NEGATIVE mg/dL
KETONES UR: NEGATIVE mg/dL
Nitrite: NEGATIVE
Protein, ur: 30 mg/dL — AB
Specific Gravity, Urine: 1.009 (ref 1.005–1.030)
Urobilinogen, UA: 1 mg/dL (ref 0.0–1.0)
pH: 6 (ref 5.0–8.0)

## 2014-06-20 LAB — URINE MICROSCOPIC-ADD ON

## 2014-06-20 LAB — PREPARE RBC (CROSSMATCH)

## 2014-06-20 LAB — RAPID URINE DRUG SCREEN, HOSP PERFORMED
AMPHETAMINES: NOT DETECTED
BARBITURATES: NOT DETECTED
Benzodiazepines: NOT DETECTED
COCAINE: NOT DETECTED
OPIATES: NOT DETECTED
TETRAHYDROCANNABINOL: NOT DETECTED

## 2014-06-20 LAB — LACTATE DEHYDROGENASE: LDH: 387 U/L — AB (ref 94–250)

## 2014-06-20 LAB — RETICULOCYTES
RBC.: 1.81 MIL/uL — ABNORMAL LOW (ref 4.22–5.81)
Retic Count, Absolute: 257 10*3/uL — ABNORMAL HIGH (ref 19.0–186.0)
Retic Ct Pct: 14.2 % — ABNORMAL HIGH (ref 0.4–3.1)

## 2014-06-20 LAB — FERRITIN: Ferritin: 1951 ng/mL — ABNORMAL HIGH (ref 22–322)

## 2014-06-20 LAB — ETHANOL: Alcohol, Ethyl (B): <11 mg/dL (ref 0–11)

## 2014-06-20 MED ORDER — DIPHENHYDRAMINE HCL 50 MG/ML IJ SOLN: 12.5000 mg | Freq: Four times a day (QID) | INTRAMUSCULAR | Status: DC | PRN

## 2014-06-20 MED ORDER — DIPHENHYDRAMINE HCL 12.5 MG/5ML PO ELIX
12.5000 mg | ORAL_SOLUTION | Freq: Four times a day (QID) | ORAL | Status: DC | PRN
Start: 2014-06-20 — End: 2014-06-21

## 2014-06-20 MED ORDER — INFLUENZA VAC SPLIT QUAD 0.5 ML IM SUSY
0.5000 mL | PREFILLED_SYRINGE | INTRAMUSCULAR | Status: AC
  Administered 2014-06-21: 0.5 mL via INTRAMUSCULAR
  Filled 2014-06-20 (×2): qty 0.5

## 2014-06-20 MED ORDER — SODIUM CHLORIDE 0.9 % IV SOLN
INTRAVENOUS | Status: DC
  Administered 2014-06-20 – 2014-06-21 (×4): via INTRAVENOUS

## 2014-06-20 MED ORDER — SODIUM CHLORIDE 0.9 % IJ SOLN: 9.0000 mL | INTRAMUSCULAR | Status: DC | PRN

## 2014-06-20 MED ORDER — HYDROMORPHONE HCL 1 MG/ML IJ SOLN
1.0000 mg | Freq: Once | INTRAMUSCULAR | Status: AC
  Administered 2014-06-20: 1 mg via INTRAVENOUS
  Filled 2014-06-20: qty 1

## 2014-06-20 MED ORDER — NALOXONE HCL 0.4 MG/ML IJ SOLN: 0.4000 mg | INTRAMUSCULAR | Status: DC | PRN

## 2014-06-20 MED ORDER — ONDANSETRON HCL 4 MG/2ML IJ SOLN
4.0000 mg | Freq: Once | INTRAMUSCULAR | Status: AC
  Administered 2014-06-20: 4 mg via INTRAVENOUS
  Filled 2014-06-20: qty 2

## 2014-06-20 MED ORDER — HYDROMORPHONE 0.3 MG/ML IV SOLN
INTRAVENOUS | Status: DC
  Administered 2014-06-20: 0.01 mg via INTRAVENOUS
  Administered 2014-06-20: 0.3 mg via INTRAVENOUS
  Administered 2014-06-21: 0.6 mg via INTRAVENOUS
  Administered 2014-06-21: 0.3 mg via INTRAVENOUS
  Administered 2014-06-21: 3 mg via INTRAVENOUS
  Administered 2014-06-21: 0.3 mg via INTRAVENOUS
  Administered 2014-06-22: 0.01 mg via INTRAVENOUS
  Administered 2014-06-22: 0.3 mg via INTRAVENOUS
  Administered 2014-06-22: 1.5 mg via INTRAVENOUS
  Filled 2014-06-20: qty 25

## 2014-06-20 MED ORDER — ONDANSETRON HCL 4 MG/2ML IJ SOLN
4.0000 mg | Freq: Four times a day (QID) | INTRAMUSCULAR | Status: DC | PRN
Start: 2014-06-20 — End: 2014-06-23

## 2014-06-20 MED ORDER — SODIUM CHLORIDE 0.9 % IV SOLN
10.0000 mL/h | Freq: Once | INTRAVENOUS | Status: AC
  Administered 2014-06-20: 10 mL/h via INTRAVENOUS

## 2014-06-20 NOTE — ED Notes (Signed)
Per patient symptoms started yesterday approximately between 5 and 6 pm. Patient states he was discharged earlier this month from [facility] for a sickle cell crisis. Pt states fever and pain and vomiting started yesterday, and he has been vomiting since approximately 9 pm last night. Pt states pain is 7/10.

## 2014-06-20 NOTE — Progress Notes (Signed)
  CARE MANAGEMENT ED NOTE 06/20/2014  Patient:  Shawn Adams, Shawn Adams   Account Number:  [id number]  Date Initiated:  06/20/2014  Documentation initiated by:  Radford Pax  Subjective/Objective Assessment:   Patient presents to Ed with sickle cell crisis     Subjective/Objective Assessment Detail:   Patient admitted 09/04 to 09/06 for sickle cell anemia/AKI     Action/Plan:   Action/Plan Detail:   Anticipated DC Date:       Status Recommendation to Physician:   Result of Recommendation:    Other ED Services  Consult Working Plan    DC Planning Services  Other  PCP issues    Choice offered to / List presented to:            Status of service:  Completed, signed off  ED Comments:   ED Comments Detail:  EDCM spoke to patient at bedside.  Patient confirms his pcp is Dr. Julio Sicks.  Patient reports he has not seen his pcp in between admissions.  Patient reports he has received a follow up discharge phone call.  Patient reports he has an appointment on October 5th to see a practioner Dr. Pamalee Leyden at [facility].  Patient also reports his Hematologist is Dr. Jolyne Loa at [facility] as well.  This information was placed in readmission focus note.  No further EDCM needs at this time.

## 2014-06-20 NOTE — ED Notes (Signed)
Per EMS crisis started yesterday approximately 5pm, N/V/D, patient ambulatory.

## 2014-06-20 NOTE — ED Notes (Signed)
Bed: WU98 Expected date: 06/20/14 Expected time:  Means of arrival: Ambulance Comments: EMS-Sickle Cell

## 2014-06-20 NOTE — H&P (Signed)
[facility] History and Physical  Shawn Adams RUE:[id number] DOB: 02-23-1976 DOA: 06/20/2014  Referring physician: ER physician PCP: Jackie Plum, MD   Chief Complaint: sickle cell pain, nausea, vomiting   HPI:  39 year old male with past medical history of sickle cell disease, chronic kidney disease, sickle cell anemia who presented to [facility] ED 06/20/2014 with ongoing nausea, vomiting and diarrhea started one day prior to this admission. Patient reports that area completely resolved and at this time he feels nausea but no vomiting. Patient also reports worsening generalized pain typical for sickle cell crisis. His pain is 7/10 in intensity and was not relieved with analgesia at home. Patient is taking Percocet at home for pain relief. No chest pain, shortness of breath or palpitations. No fevers. No cough. No lightheadedness or loss of consciousness. In ED, vitals are stable. Blood work revealed leukocytosis of 16.6, hemoglobin 5.2 and creatinine of 2.71. One unit of PRBC transfusion was ordered in ED. Patient was admitted for further management of sickle cell pain crisis and related anemia.  Assessment & Plan    Principal Problem:   Sickle cell anemia / Symptomatic anemia  Hemoglobin on admission 5.2. Transfusion with 1 U PRBC ordered by ED physician.  Check LDH, ferritin, haptoglobin. Retic count on this admission 14.2, manual 257.  CXR on this admission showed mild chronic interstitial markings likely sequela of sickle cell disease  Resume folic acid and multivitamin. Active Problems:   Sickle cell pain crisis  Resume home pain meds. Started dilaudid 0.5 mg IV every 4 hours PRN   CKD (chronic kidney disease) stage 3, GFR 30-59 ml/min  Creatinine on 05/30/2014 2.33 and on this admission 2.7  Acute elevation in creatinine probably reflective of dehydration secondary to nausea, vomiting and diarrhea.  We will continue IV fluids and recheck kidney function in a.m.  Nausea, vomiting, diarrhea  Possibly viral gastroenteritis. Patient reports diarrhea resolved. He still has nausea but no vomiting.  Continue IV fluids, antiemetics as needed.   Leukocytosis  No evidence of infection. Check UA and urine culture; CXR did not reveal acute cardiopulmonary findings.   Hypercalcemia  Possible from dehydration  Continue IV fluids  Recheck BMP in am  DVT prophylaxis:   SCD's bilaterally   Radiological Exams on Admission:  Dg Chest 2 View 06/20/2014  1. There is no evidence of CHF. 2. Bulky mediastinal and hilar lymphadenopathy is present and stable. 3. Mild chronically increased interstitial markings may be the sequelae of the patient's sickle cell disease. One cannot exclude underlying COPD.       Code Status: Full Family Communication: Plan of care discussed with the patient  Disposition Plan: Admit for further evaluation  Manson Passey, MD  [facility] Pager [phone]  Review of Systems:  Constitutional: Negative for fever, chills and malaise/fatigue. Negative for diaphoresis.  HENT: Negative for hearing loss, ear pain, nosebleeds, congestion, sore throat, neck pain, tinnitus and ear discharge.   Eyes: Negative for blurred vision, double vision, photophobia, pain, discharge and redness.  Respiratory: Negative for cough, hemoptysis, sputum production, shortness of breath, wheezing and stridor.   Cardiovascular: Negative for chest pain, palpitations, orthopnea, claudication and leg swelling.  Gastrointestinal: per HPI Genitourinary: Negative for dysuria, urgency, frequency, hematuria and flank pain.  Musculoskeletal: Negative for myalgias, back pain, joint pain and falls.  Skin: Negative for itching and rash.  Neurological: Negative for dizziness and weakness. Negative for tingling, tremors, sensory change, speech change, focal weakness, loss of consciousness and headaches.  Endo/Heme/Allergies: Negative for environmental  allergies and  polydipsia. Does not bruise/bleed easily.  Psychiatric/Behavioral: Negative for suicidal ideas. The patient is not nervous/anxious.      Past Medical History  Diagnosis Date  . Sickle cell anemia   . Aneurysm     Pt states happened in 2011, Brain    Past Surgical History  Procedure Laterality Date  . Lymph node biopsy     Social History:  reports that he has never smoked. He has never used smokeless tobacco. He reports that he drinks about .6 ounces of alcohol per week. He reports that he uses illicit drugs (Marijuana).  No Known Allergies  Family History: sickle cell in family    Prior to Admission medications   Medication Sig Start Date End Date Taking? Authorizing Provider  cholecalciferol (VITAMIN D) 1000 UNITS tablet Take 1,000 Units by mouth daily.   Yes Historical Provider, MD  folic acid (FOLVITE) 1 MG tablet Take 1 mg by mouth daily. Added to Med Rec 9/6 per patient update, MD aware   Yes Historical Provider, MD  Multiple Vitamins-Minerals (MULTIVITAMIN WITH MINERALS) tablet Take 1 tablet by mouth daily.   Yes Historical Provider, MD  oxyCODONE-acetaminophen (PERCOCET) 5-325 MG per tablet Take 1 tablet by mouth every 4 (four) hours as needed for moderate pain.    Yes Historical Provider, MD   Physical Exam: Filed Vitals:   06/20/14 1534 06/20/14 1706 06/20/14 1707 06/20/14 1810  BP: 122/70 137/72 137/72 134/71  Pulse: 83 86 81 81  Temp: 98 F (36.7 C)     TempSrc: Oral     Resp: Height: 6' (1.829 m)     Weight: 58.06 kg (128 lb)     SpO2: 100% 96% 96% 99%    Physical Exam  Constitutional: Appears well-developed and well-nourished. No distress.  HENT: Normocephalic. No tonsillar erythema or exudates Eyes: Conjunctivae and EOM are normal. PERRLA, no scleral icterus.  Neck: Normal ROM. Neck supple. No JVD. No tracheal deviation. No thyromegaly.  CVS: RRR, S1/S2 +, no murmurs, no gallops, no carotid bruit.  Pulmonary: Effort and breath sounds normal,  no stridor, rhonchi, wheezes, rales.  Abdominal: Soft. BS +,  no distension, tenderness, rebound or guarding.  Musculoskeletal: Normal range of motion. No edema and no tenderness.  Lymphadenopathy: No lymphadenopathy noted, cervical, inguinal. Neuro: Alert. Normal reflexes, muscle tone coordination. No focal neurologic deficits. Skin: Skin is warm and dry. No rash noted. Not diaphoretic. No erythema. No pallor.  Psychiatric: Normal mood and affect. Behavior, judgment, thought content normal.   Labs on Admission:  Basic Metabolic Panel:  Recent Labs Lab 06/20/14 1543  NA 135*  K 4.0  CL 100  CO2 21  GLUCOSE 94  BUN 27*  CREATININE 2.71*  CALCIUM 13.2*   Liver Function Tests:  Recent Labs Lab 06/20/14 1543  AST 76*  ALT 49  ALKPHOS 159*  BILITOT 3.3*  PROT 8.2  ALBUMIN 3.8   No results found for this basename: LIPASE, AMYLASE,  in the last 168 hours No results found for this basename: AMMONIA,  in the last 168 hours CBC:  Recent Labs Lab 06/20/14 1543  WBC 16.6*  NEUTROABS 10.1*  HGB 5.2*  HCT 15.2*  MCV 84.0  PLT 766*   Cardiac Enzymes: No results found for this basename: CKTOTAL, CKMB, CKMBINDEX, TROPONINI,  in the last 168 hours BNP: No components found with this basename: POCBNP,  CBG: No results found for this basename: GLUCAP,  in the last 168 hours  If 7PM-7AM, please contact night-coverage [email or web address] Password [facility] 06/20/2014, 6:40 PM

## 2014-06-21 DIAGNOSIS — D5700 Hb-SS disease with crisis, unspecified: Secondary | ICD-10-CM | POA: Diagnosis not present

## 2014-06-21 LAB — MAGNESIUM: Magnesium: 1.6 mg/dL (ref 1.5–2.5)

## 2014-06-21 LAB — COMPREHENSIVE METABOLIC PANEL
ALK PHOS: 139 U/L — AB (ref 39–117)
ALT: 43 U/L (ref 0–53)
ANION GAP: 12 (ref 5–15)
AST: 68 U/L — ABNORMAL HIGH (ref 0–37)
Albumin: 3.3 g/dL — ABNORMAL LOW (ref 3.5–5.2)
BUN: 24 mg/dL — ABNORMAL HIGH (ref 6–23)
CHLORIDE: 103 meq/L (ref 96–112)
CO2: 21 mEq/L (ref 19–32)
Calcium: 12.8 mg/dL — ABNORMAL HIGH (ref 8.4–10.5)
Creatinine, Ser: 2.61 mg/dL — ABNORMAL HIGH (ref 0.50–1.35)
GFR calc non Af Amer: 30 mL/min — ABNORMAL LOW (ref >90)
GFR, EST AFRICAN AMERICAN: 34 mL/min — AB (ref >90)
GLUCOSE: 89 mg/dL (ref 70–99)
POTASSIUM: 4.1 meq/L (ref 3.7–5.3)
Sodium: 136 mEq/L — ABNORMAL LOW (ref 137–147)
Total Bilirubin: 3.7 mg/dL — ABNORMAL HIGH (ref 0.3–1.2)
Total Protein: 7.2 g/dL (ref 6.0–8.3)

## 2014-06-21 LAB — CBC WITH DIFFERENTIAL/PLATELET
BASOS PCT: 0 % (ref 0–1)
Basophils Absolute: 0.1 10*3/uL (ref 0.0–0.1)
EOS ABS: 0.9 10*3/uL — AB (ref 0.0–0.7)
EOS PCT: 6 % — AB (ref 0–5)
HCT: 13 % — ABNORMAL LOW (ref 39.0–52.0)
Hemoglobin: 4.5 g/dL — CL (ref 13.0–17.0)
Lymphocytes Relative: 25 % (ref 12–46)
Lymphs Abs: 3.7 10*3/uL (ref 0.7–4.0)
MCH: 29 pg (ref 26.0–34.0)
MCHC: 34.6 g/dL (ref 30.0–36.0)
MCV: 83.9 fL (ref 78.0–100.0)
Monocytes Absolute: 1.4 10*3/uL — ABNORMAL HIGH (ref 0.1–1.0)
Monocytes Relative: 10 % (ref 3–12)
Neutro Abs: 8.5 10*3/uL — ABNORMAL HIGH (ref 1.7–7.7)
Neutrophils Relative %: 58 % (ref 43–77)
PLATELETS: 663 10*3/uL — AB (ref 150–400)
RBC: 1.55 MIL/uL — ABNORMAL LOW (ref 4.22–5.81)
RDW: 20.3 % — ABNORMAL HIGH (ref 11.5–15.5)
WBC: 14.6 10*3/uL — ABNORMAL HIGH (ref 4.0–10.5)

## 2014-06-21 LAB — RETICULOCYTES
RBC.: 1.55 MIL/uL — AB (ref 4.22–5.81)
RETIC CT PCT: 14.2 % — AB (ref 0.4–3.1)
Retic Count, Absolute: 220.1 10*3/uL — ABNORMAL HIGH (ref 19.0–186.0)

## 2014-06-21 LAB — HAPTOGLOBIN: Haptoglobin: <25 mg/dL — ABNORMAL LOW (ref 45–215)

## 2014-06-21 MED ORDER — SODIUM CHLORIDE 0.9 % IV SOLN
Freq: Once | INTRAVENOUS | Status: AC
  Administered 2014-06-21: 21:00:00 via INTRAVENOUS

## 2014-06-21 MED ORDER — OXYCODONE-ACETAMINOPHEN 5-325 MG PO TABS: 1.0000 | ORAL_TABLET | ORAL | Status: DC | PRN

## 2014-06-21 MED ORDER — DEXTROSE-NACL 5-0.45 % IV SOLN
INTRAVENOUS | Status: DC
  Administered 2014-06-21: 17:00:00 via INTRAVENOUS
  Administered 2014-06-22: 1000 mL via INTRAVENOUS

## 2014-06-21 MED ORDER — FOLIC ACID 1 MG PO TABS
1.0000 mg | ORAL_TABLET | Freq: Every day | ORAL | Status: DC
  Filled 2014-06-21: qty 1

## 2014-06-21 MED ORDER — SODIUM CHLORIDE 0.9 % IV SOLN: 250.0000 mL | INTRAVENOUS | Status: DC | PRN

## 2014-06-21 MED ORDER — SENNOSIDES-DOCUSATE SODIUM 8.6-50 MG PO TABS
1.0000 | ORAL_TABLET | Freq: Every day | ORAL | Status: DC
  Administered 2014-06-22 – 2014-06-23 (×2): 1 via ORAL
  Filled 2014-06-21 (×2): qty 1

## 2014-06-21 MED ORDER — SODIUM CHLORIDE 0.9 % IJ SOLN
3.0000 mL | Freq: Two times a day (BID) | INTRAMUSCULAR | Status: DC
  Administered 2014-06-22: 3 mL via INTRAVENOUS

## 2014-06-21 MED ORDER — VITAMIN D3 25 MCG (1000 UNIT) PO TABS
1000.0000 [IU] | ORAL_TABLET | Freq: Every day | ORAL | Status: DC
  Administered 2014-06-21 – 2014-06-23 (×3): 1000 [IU] via ORAL
  Filled 2014-06-21 (×3): qty 1

## 2014-06-21 MED ORDER — MULTI-VITAMIN/MINERALS PO TABS: 1.0000 | ORAL_TABLET | Freq: Every day | ORAL | Status: DC

## 2014-06-21 MED ORDER — ADULT MULTIVITAMIN W/MINERALS CH
1.0000 | ORAL_TABLET | Freq: Every day | ORAL | Status: DC
  Administered 2014-06-21 – 2014-06-23 (×3): 1 via ORAL
  Filled 2014-06-21 (×3): qty 1

## 2014-06-21 MED ORDER — DIPHENHYDRAMINE HCL 50 MG/ML IJ SOLN: 12.5000 mg | INTRAMUSCULAR | Status: DC | PRN

## 2014-06-21 MED ORDER — SODIUM CHLORIDE 0.9 % IJ SOLN: 3.0000 mL | INTRAMUSCULAR | Status: DC | PRN

## 2014-06-21 MED ORDER — FOLIC ACID 1 MG PO TABS
1.0000 mg | ORAL_TABLET | Freq: Every day | ORAL | Status: DC
  Administered 2014-06-21 – 2014-06-23 (×3): 1 mg via ORAL
  Filled 2014-06-21 (×3): qty 1

## 2014-06-21 MED ORDER — KETOROLAC TROMETHAMINE 15 MG/ML IJ SOLN
15.0000 mg | Freq: Four times a day (QID) | INTRAMUSCULAR | Status: DC
  Administered 2014-06-22 – 2014-06-23 (×4): 15 mg via INTRAVENOUS
  Filled 2014-06-21 (×10): qty 1

## 2014-06-21 MED ORDER — ALUM & MAG HYDROXIDE-SIMETH 200-200-20 MG/5ML PO SUSP
30.0000 mL | ORAL | Status: DC | PRN
  Administered 2014-06-21 – 2014-06-23 (×2): 30 mL via ORAL
  Filled 2014-06-21 (×2): qty 30

## 2014-06-21 MED ORDER — PROMETHAZINE HCL 25 MG PO TABS: 12.5000 mg | ORAL_TABLET | ORAL | Status: DC | PRN

## 2014-06-21 MED ORDER — PROMETHAZINE HCL 25 MG RE SUPP: 12.5000 mg | RECTAL | Status: DC | PRN

## 2014-06-21 MED ORDER — DIPHENHYDRAMINE HCL 25 MG PO CAPS: 25.0000 mg | ORAL_CAPSULE | ORAL | Status: DC | PRN

## 2014-06-21 NOTE — Progress Notes (Signed)
Patient ID: Shawn Adams, male   DOB: 08-Nov-1975, 38 y.o.   MRN: [id number] SICKLE CELL SERVICE PROGRESS NOTE  Shawn Adams JYN:[id number] DOB: 06-20-1976 DOA: 06/20/2014 PCP: Jackie Plum, MD   Presenting HPI: 38 year old male with past medical history of sickle cell disease, chronic kidney disease, sickle cell anemia who presented to [facility] ED 06/20/2014 with ongoing nausea, vomiting and diarrhea started one day prior to this admission. Patient reports that area completely resolved and at this time he feels nausea but no vomiting. Patient also reports worsening generalized pain typical for sickle cell crisis. His pain is 7/10 in intensity and was not relieved with analgesia at home. Patient is taking Percocet at home for pain relief. No chest pain, shortness of breath or palpitations. No fevers. No cough. No lightheadedness or loss of consciousness.  In ED, vitals are stable. Blood work revealed leukocytosis of 16.6, hemoglobin 5.2 and creatinine of 2.71. One unit of PRBC transfusion was ordered in ED. Patient was admitted for further management of sickle cell pain crisis and related anemia.   Consultants:  None  Procedures:  None  Antibiotics:  None  HPI/Subjective: Pt still states that he is in pain but it is better than when he first came to the hospital. He rates his pain as an 8/10 in his legs and chest. Yesterday he complained of abdominal pain but that has improved along with the nausea, no emesis. He reports being slightly short of breath. Denies cough, fever, or sputum production. His hgb was 4.5 this morning. 1 unit of blood has been ordered but not transfused as of yet. Pt states that he has received transfusions before and he is difficult to find blood for.  Objective: Filed Vitals:   06/21/14 0432 06/21/14 0522 06/21/14 0955 06/21/14 1730  BP:  117/65 119/68 116/65  Pulse:  88 85 102  Temp:  98.3 F (36.8 C) 98.2 F (36.8 C) 97.4 F (36.3 C)  TempSrc:  Oral Oral Oral   Resp: Height:      Weight:  132 lb (59.875 kg)    SpO2: 95% 96% 95% 99%   Weight change:   Intake/Output Summary (Last 24 hours) at 06/21/14 1859 Last data filed at 06/21/14 1850  Gross per 24 hour  Intake 2967.5 ml  Output   3000 ml  Net  -32.5 ml    General: Alert, awake, oriented x3, in no acute distress.  HEENT: [place]/AT PEERL, EOMI Neck: Trachea midline,  no masses, no thyromegal,y no JVD, no carotid bruit OROPHARYNX:  Moist, No exudate/ erythema/lesions.  Heart: Regular rate and rhythm, without murmurs, rubs, gallops, PMI non-displaced, no heaves or thrills on palpation.  Lungs: Clear to auscultation, no wheezing or rhonchi noted. No increased vocal fremitus resonant to percussion  Abdomen: Soft, nontender, nondistended, positive bowel sounds, no masses no hepatosplenomegaly noted..  Neuro: No focal neurological deficits noted cranial nerves II through XII grossly intact.  Strength 5 out of 5 in bilateral upper and lower extremities. Musculoskeletal: No warmth, swelling or erythema around joints, no spinal tenderness noted. Psychiatric: Patient alert and oriented x3, good insight and cognition, good recent to remote recall. Lymph node survey: No cervical axillary or inguinal lymphadenopathy noted.   Data Reviewed: Basic Metabolic Panel:  Recent Labs Lab 06/20/14 1543 06/21/14 0732  NA 135* 136*  K 4.0 4.1  CL 100 103  CO2 21 21  GLUCOSE 94 89  BUN 27* 24*  CREATININE 2.71* 2.61*  CALCIUM 13.2* 12.8*  MG  --  1.6   Liver Function Tests:  Recent Labs Lab 06/20/14 1543 06/21/14 0732  AST 76* 68*  ALT 49 43  ALKPHOS 159* 139*  BILITOT 3.3* 3.7*  PROT 8.2 7.2  ALBUMIN 3.8 3.3*   No results found for this basename: LIPASE, AMYLASE,  in the last 168 hours No results found for this basename: AMMONIA,  in the last 168 hours CBC:  Recent Labs Lab 06/20/14 1543 06/21/14 0732  WBC 16.6* 14.6*  NEUTROABS 10.1* 8.5*  HGB 5.2* 4.5*  HCT 15.2*  13.0*  MCV 84.0 83.9  PLT 766* 663*    Studies: Dg Chest 2 View  06/20/2014   CLINICAL DATA:  Sickle cell crisis with central chest pain shortness of breath and coughing as well as nausea and vomiting and diarrhea with onset since yesterday history of enlarged mediastinal lymph nodes and subsequent biopsy.  EXAM: CHEST  2 VIEW  COMPARISON:  Portable chest x-ray of May 31, 2014  FINDINGS: Bulky mediastinal and hilar lymph nodes persists. The cardiopericardial silhouette is normal in size. The pulmonary interstitial markings are mildly prominent though stable. There is no pleural effusion or pneumothorax. The observed bony thorax is unremarkable.  IMPRESSION: 1. There is no evidence of CHF. 2. Bulky mediastinal and hilar lymphadenopathy is present and stable. 3. Mild chronically increased interstitial markings may be the sequelae of the patient's sickle cell disease. One cannot exclude underlying COPD.   Electronically Signed   By: David  [place]   On: 06/20/2014 16:07   Dg Chest 2 View  05/31/2014   CLINICAL DATA:  Headaches for 3 months. Shortness of breath. Sickle cell anemia.  EXAM: CHEST  2 VIEW  COMPARISON:  09/03/2013  FINDINGS: Right paratracheal and bilateral hilar prominence suggesting mediastinal/hilar lymphadenopathy. Appearance is similar to previous study. Mild cardiac enlargement. No focal consolidation or airspace disease. No blunting of costophrenic angles. No pneumothorax.  IMPRESSION: Bilateral hilar and mediastinal lymphadenopathy is again demonstrated, similar to prior study. Cardiac enlargement. No focal consolidation.   Electronically Signed   By: [name] M.D.   On: 05/31/2014 00:01   Dg Chest Port 1 View  05/31/2014   CLINICAL DATA:  Right chest pain, shortness of breath, sickle cell  EXAM: PORTABLE CHEST - 1 VIEW  COMPARISON:  Chest radiograph dated 05/30/2014. CT chest dated 09/04/2013.  FINDINGS: Lungs are essentially clear. No focal consolidation. No pleural effusion  or pneumothorax.  Cardiomegaly.  Mediastinal/hilar lymphadenopathy, unchanged.  IMPRESSION: Mediastinal/ hilar lymphadenopathy, unchanged.   Electronically Signed   By: Charline Bills M.D.   On: 05/31/2014 10:54    Scheduled Meds: . sodium chloride   Intravenous Once  . cholecalciferol  1,000 Units Oral Daily  . folic acid  1 mg Oral Daily  . HYDROmorphone PCA 0.3 mg/mL   Intravenous 6 times per day  . ketorolac  15 mg Intravenous 4 times per day  . multivitamin with minerals  1 tablet Oral Daily  . sodium chloride  3 mL Intravenous Q12H   Continuous Infusions: . dextrose 5 % and 0.45% NaCl 75 mL/hr at 06/21/14 1650    Principal Problem:   Sickle cell anemia Active Problems:   Symptomatic anemia   CKD (chronic kidney disease) stage 3, GFR 30-59 ml/min   Leukocytosis   Sickle cell pain crisis   Hypercalcemia   Assessment/Plan: Principal Problem:   Sickle cell anemia Active Problems:   Symptomatic anemia   CKD (chronic kidney disease) stage 3, GFR 30-59 ml/min  Leukocytosis   Sickle cell pain crisis   Hypercalcemia  1)Sickle Cell Crisis: Pt's pain is slightly improved, currently at an 8/10. Will make no changes to Dilaudid PCA today. Start Toradol. Discontinue Percocet. Pt encouraged to push PCA.  2) Anemia: Hgb down to 4.5 this morning. 1 unit of PRBCs ordered and to be given. Will order another unit for a total of 2.  Will continue to monitor. Transfuse another unit again if Hgb less than when checked in the morning.   3)Sickle Cell Care: Continue Folic acid and hydroxyurea.   4)SOB: Likely due to anemia. His CXR is without acute changes, he is not hypoxic or febrile. Likelihood of PNA or ACS is low. Will continue to monitor. Repeat CXR if worsening.   5) FEN/GI : Regular Diet  IV fluids per protocol-D5/0.45%, continue at 75cc/hr.  Bowel regimen in place  Code Status: Full  DVT Prophylaxis: SCD while severely anemic Family Communication: none  Disposition Plan:  none   Serina Cowper  Pager [phone]. If 7PM-7AM, please contact night-coverage.  06/21/2014, 6:59 PM  LOS: 1 day   Serina Cowper

## 2014-06-22 LAB — BASIC METABOLIC PANEL
Anion gap: 11 (ref 5–15)
BUN: 22 mg/dL (ref 6–23)
CHLORIDE: 102 meq/L (ref 96–112)
CO2: 22 mEq/L (ref 19–32)
Calcium: 12.9 mg/dL — ABNORMAL HIGH (ref 8.4–10.5)
Creatinine, Ser: 2.52 mg/dL — ABNORMAL HIGH (ref 0.50–1.35)
GFR calc Af Amer: 36 mL/min — ABNORMAL LOW (ref >90)
GFR calc non Af Amer: 31 mL/min — ABNORMAL LOW (ref >90)
GLUCOSE: 92 mg/dL (ref 70–99)
Potassium: 3.9 mEq/L (ref 3.7–5.3)
SODIUM: 135 meq/L — AB (ref 137–147)

## 2014-06-22 LAB — RETICULOCYTES
RBC.: 1.91 MIL/uL — ABNORMAL LOW (ref 4.22–5.81)
RETIC CT PCT: 12.9 % — AB (ref 0.4–3.1)
Retic Count, Absolute: 246.4 10*3/uL — ABNORMAL HIGH (ref 19.0–186.0)

## 2014-06-22 LAB — CBC WITH DIFFERENTIAL/PLATELET
Basophils Absolute: 0 10*3/uL (ref 0.0–0.1)
Basophils Relative: 0 % (ref 0–1)
EOS ABS: 1.1 10*3/uL — AB (ref 0.0–0.7)
Eosinophils Relative: 8 % — ABNORMAL HIGH (ref 0–5)
HCT: 16.1 % — ABNORMAL LOW (ref 39.0–52.0)
HEMOGLOBIN: 5.5 g/dL — AB (ref 13.0–17.0)
Lymphocytes Relative: 34 % (ref 12–46)
Lymphs Abs: 4.5 10*3/uL — ABNORMAL HIGH (ref 0.7–4.0)
MCH: 28.8 pg (ref 26.0–34.0)
MCHC: 34.2 g/dL (ref 30.0–36.0)
MCV: 84.3 fL (ref 78.0–100.0)
MONO ABS: 1.5 10*3/uL — AB (ref 0.1–1.0)
Monocytes Relative: 11 % (ref 3–12)
NEUTROS PCT: 47 % (ref 43–77)
Neutro Abs: 6.2 10*3/uL (ref 1.7–7.7)
Platelets: 631 10*3/uL — ABNORMAL HIGH (ref 150–400)
RBC: 1.91 MIL/uL — ABNORMAL LOW (ref 4.22–5.81)
RDW: 18.5 % — ABNORMAL HIGH (ref 11.5–15.5)
WBC: 13.3 10*3/uL — ABNORMAL HIGH (ref 4.0–10.5)

## 2014-06-22 LAB — URINE CULTURE
Colony Count: NO GROWTH
Culture: NO GROWTH

## 2014-06-22 LAB — LACTATE DEHYDROGENASE: LDH: 354 U/L — AB (ref 94–250)

## 2014-06-22 LAB — PREPARE RBC (CROSSMATCH)

## 2014-06-22 LAB — HEMOGLOBIN AND HEMATOCRIT, BLOOD
HCT: 18.6 % — ABNORMAL LOW (ref 39.0–52.0)
Hemoglobin: 6.5 g/dL — CL (ref 13.0–17.0)

## 2014-06-22 MED ORDER — OXYCODONE-ACETAMINOPHEN 5-325 MG PO TABS
1.0000 | ORAL_TABLET | ORAL | Status: DC
  Administered 2014-06-22 – 2014-06-23 (×3): 1 via ORAL
  Filled 2014-06-22 (×3): qty 1

## 2014-06-22 MED ORDER — SODIUM CHLORIDE 0.9 % IV SOLN: Freq: Once | INTRAVENOUS | Status: DC

## 2014-06-22 MED ORDER — NALOXONE HCL 0.4 MG/ML IJ SOLN: 0.4000 mg | INTRAMUSCULAR | Status: DC | PRN

## 2014-06-22 MED ORDER — SODIUM CHLORIDE 0.9 % IJ SOLN
9.0000 mL | INTRAMUSCULAR | Status: DC | PRN
Start: 2014-06-22 — End: 2014-06-23

## 2014-06-22 MED ORDER — HYDROMORPHONE 2 MG/ML HIGH CONCENTRATION IV PCA SOLN
INTRAVENOUS | Status: DC
  Administered 2014-06-22: 0.9 mg via INTRAVENOUS

## 2014-06-22 MED ORDER — ENSURE COMPLETE PO LIQD
237.0000 mL | Freq: Two times a day (BID) | ORAL | Status: DC
  Administered 2014-06-22: 237 mL via ORAL

## 2014-06-22 MED ORDER — HYDROMORPHONE 0.3 MG/ML IV SOLN
INTRAVENOUS | Status: DC
  Administered 2014-06-22: 0.9 mg via INTRAVENOUS
  Administered 2014-06-22: 0.6 mg via INTRAVENOUS
  Administered 2014-06-23: 0.3 mg via INTRAVENOUS
  Administered 2014-06-23: 1.5 mg via INTRAVENOUS
  Filled 2014-06-22: qty 25

## 2014-06-22 NOTE — Progress Notes (Signed)
Patient ID: Shawn Adams, male   DOB: [birth year], 38 y.o.   MRN: [id number] SICKLE CELL SERVICE PROGRESS NOTE  Shawn Adams XBJ:[id number] DOB: 1975-11-20 DOA: 06/20/2014 PCP: Jackie Plum, MD   Presenting HPI: 38 year old male with past medical history of sickle cell disease, chronic kidney disease, sickle cell anemia who presented to [facility] ED 06/20/2014 with ongoing nausea, vomiting and diarrhea started one day prior to this admission. Patient reports that area completely resolved and at this time he feels nausea but no vomiting. Patient also reports worsening generalized pain typical for sickle cell crisis. His pain is 7/10 in intensity and was not relieved with analgesia at home. Patient is taking Percocet at home for pain relief. No chest pain, shortness of breath or palpitations. No fevers. No cough. No lightheadedness or loss of consciousness.  In ED, vitals are stable. Blood work revealed leukocytosis of 16.6, hemoglobin 5.2 and creatinine of 2.71. One unit of PRBC transfusion was ordered in ED. Patient was admitted for further management of sickle cell pain crisis and related anemia.   Consultants:  None  Procedures:  None  Antibiotics:  None  HPI/Subjective: Pt states that his pain is better and rates it at a 3/10, mostly in his legs. He received both PRBC transfusions but CBC was drawn in between.  Objective: Filed Vitals:   06/22/14 0730 06/22/14 0831 06/22/14 0950 06/22/14 1345  BP: 120/73  125/72   Pulse: 66  75   Temp: 97.9 F (36.6 C)  97.6 F (36.4 C)   TempSrc: Oral  Oral   Resp: Height:      Weight:      SpO2: 95% 96% 95% 92%   Weight change: 4 lb (1.814 kg)  Intake/Output Summary (Last 24 hours) at 06/22/14 1355 Last data filed at 06/22/14 0900  Gross per 24 hour  Intake 2137.5 ml  Output   1300 ml  Net  837.5 ml    General: Alert, awake, oriented x3, in no acute distress.  HEENT: [place]/AT PEERL, EOMI Neck: Trachea midline,  no masses,  no thyromegal,y no JVD, no carotid bruit OROPHARYNX:  Moist, No exudate/ erythema/lesions.  Heart: Regular rate and rhythm, without murmurs, rubs, gallops, PMI non-displaced, no heaves or thrills on palpation.  Lungs: Clear to auscultation, no wheezing or rhonchi noted. No increased vocal fremitus resonant to percussion  Abdomen: Soft, nontender, nondistended, positive bowel sounds, no masses no hepatosplenomegaly noted..  Neuro: No focal neurological deficits noted cranial nerves II through XII grossly intact.  Strength 5 out of 5 in bilateral upper and lower extremities. Musculoskeletal: No warmth, swelling or erythema around joints, no spinal tenderness noted. Psychiatric: Patient alert and oriented x3, good insight and cognition, good recent to remote recall.   Data Reviewed: Basic Metabolic Panel:  Recent Labs Lab 06/20/14 1543 06/21/14 0732 06/22/14 0547  NA 135* 136* 135*  K 4.0 4.1 3.9  CL 100 103 102  CO2 GLUCOSE 94 89 92  BUN 27* 24* 22  CREATININE 2.71* 2.61* 2.52*  CALCIUM 13.2* 12.8* 12.9*  MG  --  1.6  --    Liver Function Tests:  Recent Labs Lab 06/20/14 1543 06/21/14 0732  AST 76* 68*  ALT 49 43  ALKPHOS 159* 139*  BILITOT 3.3* 3.7*  PROT 8.2 7.2  ALBUMIN 3.8 3.3*   No results found for this basename: LIPASE, AMYLASE,  in the last 168 hours No results found for this basename: AMMONIA,  in the  last 168 hours CBC:  Recent Labs Lab 06/20/14 1543 06/21/14 0732 06/22/14 0547  WBC 16.6* 14.6* 13.3*  NEUTROABS 10.1* 8.5* 6.2  HGB 5.2* 4.5* 5.5*  HCT 15.2* 13.0* 16.1*  MCV 84.0 83.9 84.3  PLT 766* 663* 631*    Studies: Dg Chest 2 View  06/20/2014   CLINICAL DATA:  Sickle cell crisis with central chest pain shortness of breath and coughing as well as nausea and vomiting and diarrhea with onset since yesterday history of enlarged mediastinal lymph nodes and subsequent biopsy.  EXAM: CHEST  2 VIEW  COMPARISON:  Portable chest x-ray of  May 31, 2014  FINDINGS: Bulky mediastinal and hilar lymph nodes persists. The cardiopericardial silhouette is normal in size. The pulmonary interstitial markings are mildly prominent though stable. There is no pleural effusion or pneumothorax. The observed bony thorax is unremarkable.  IMPRESSION: 1. There is no evidence of CHF. 2. Bulky mediastinal and hilar lymphadenopathy is present and stable. 3. Mild chronically increased interstitial markings may be the sequelae of the patient's sickle cell disease. One cannot exclude underlying COPD.   Electronically Signed   By: David  [place]   On: 06/20/2014 16:07   Dg Chest 2 View  05/31/2014   CLINICAL DATA:  Headaches for 3 months. Shortness of breath. Sickle cell anemia.  EXAM: CHEST  2 VIEW  COMPARISON:  09/03/2013  FINDINGS: Right paratracheal and bilateral hilar prominence suggesting mediastinal/hilar lymphadenopathy. Appearance is similar to previous study. Mild cardiac enlargement. No focal consolidation or airspace disease. No blunting of costophrenic angles. No pneumothorax.  IMPRESSION: Bilateral hilar and mediastinal lymphadenopathy is again demonstrated, similar to prior study. Cardiac enlargement. No focal consolidation.   Electronically Signed   By: [name] M.D.   On: 05/31/2014 00:01   Dg Chest Port 1 View  05/31/2014   CLINICAL DATA:  Right chest pain, shortness of breath, sickle cell  EXAM: PORTABLE CHEST - 1 VIEW  COMPARISON:  Chest radiograph dated 05/30/2014. CT chest dated 09/04/2013.  FINDINGS: Lungs are essentially clear. No focal consolidation. No pleural effusion or pneumothorax.  Cardiomegaly.  Mediastinal/hilar lymphadenopathy, unchanged.  IMPRESSION: Mediastinal/ hilar lymphadenopathy, unchanged.   Electronically Signed   By: Charline Bills M.D.   On: 05/31/2014 10:54    Scheduled Meds: . sodium chloride   Intravenous Once  . cholecalciferol  1,000 Units Oral Daily  . feeding supplement (ENSURE COMPLETE)  237 mL Oral  BID BM  . folic acid  1 mg Oral Daily  . HYDROmorphone PCA 0.3 mg/mL   Intravenous 6 times per day  . ketorolac  15 mg Intravenous 4 times per day  . multivitamin with minerals  1 tablet Oral Daily  . senna-docusate  1 tablet Oral Daily  . sodium chloride  3 mL Intravenous Q12H   Continuous Infusions: . dextrose 5 % and 0.45% NaCl 1,000 mL (06/22/14 1033)    Principal Problem:   Sickle cell anemia Active Problems:   Symptomatic anemia   CKD (chronic kidney disease) stage 3, GFR 30-59 ml/min   Leukocytosis   Sickle cell pain crisis   Hypercalcemia   Assessment/Plan: Principal Problem:   Sickle cell anemia Active Problems:   Symptomatic anemia   CKD (chronic kidney disease) stage 3, GFR 30-59 ml/min   Leukocytosis   Sickle cell pain crisis   Hypercalcemia  1)Sickle Cell Crisis: Pt's pain has improved, currently at a 3/10. Minimal use of PCA. Will decrease Dilaudid PCA to 0.4 q . Continue Toradol. Pt encouraged to  push PCA, if in pain.  2) Anemia: Hgb up to 5.5 this morning after1 unit of PRBCs. 2nd unit of PRBC given late after CBC was drawn. Will order H/H now, and give another unit if Hgb <6.0.   Will continue to monitor.  3)Sickle Cell Care: Continue Folic acid and hydroxyurea.   4)SOB: Improved, likely due to anemia. His CXR is without acute changes, he is not hypoxic or febrile. Likelihood of PNA or ACS is low. Will continue to monitor. Repeat CXR if worsening.   5) FEN/GI : Regular Diet  IV fluids -discontinued maintenance, now KVO Bowel regimen in place  Code Status: Full  DVT Prophylaxis: SCD while severely anemic Family Communication: none  Disposition Plan: Once Hgb remains stable.   Serina Cowper  Pager [phone]. If 7PM-7AM, please contact night-coverage.  06/22/2014, 1:55 PM  LOS: 2 days   Serina Cowper

## 2014-06-22 NOTE — Progress Notes (Signed)
Family at bedside. 

## 2014-06-22 NOTE — Progress Notes (Signed)
INITIAL NUTRITION ASSESSMENT  DOCUMENTATION CODES Per approved criteria  -Underweight   INTERVENTION: 1.  General healthful diet; encourage intake of foods and beverages as able.  RD to follow and assess for nutritional adequacy.  2.  Supplements; Ensure Complete po BID, each supplement provides 350 kcal and 13 grams of protein   NUTRITION DIAGNOSIS: Unintended wt change related to increased metabolic demand as evidenced by pt with sickle cell crisis.   Monitor:  1.  Food/Beverage; pt meeting >/=90% estimated needs with tolerance. 2.  Wt/wt change; monitor trends  Reason for Assessment: MST  38 y.o. male  Admitting Dx: Sickle cell anemia  ASSESSMENT: Pt admitted with sickle cell anemia; in pain crisis.  Pt states that he recently gained up to 140 lbs which is "the most he has weighed in his life."  He believes he may have lost some of this weight due to pain crisis and his associated symptoms of nausea and vomiting.  Pt with questions about weight regain and maintenance with are answered.   Although pt's BMI does classify his as underweight, pt comfortable with nutrition status.  He does endorse taking his vitamins.  He was taking a protein supplement to help with weight gain which he hasn't taken in a while.  Discussed caloric value of protein shake and encouraged pt to resume.   Nutrition Focused Physical Exam:  Subcutaneous Fat:  Orbital Region: WNL Upper Arm Region: WNL Thoracic and Lumbar Region: WNL  Muscle:  Temple Region: mild wasting Clavicle Bone Region: WNL Clavicle and Acromion Bone Region: mild wasting Scapular Bone Region: WNL Dorsal Hand: WNL Patellar Region: not assessed, pt wearing jeans Anterior Thigh Region: not assessed; pt wearing jeans Posterior Calf Region: not assessed; pt wearing tight jeans  Edema: none present  Height: Ht Readings from Last 1 Encounters:  06/20/14 6' (1.829 m)    Weight: Wt Readings from Last 1 Encounters:  06/22/14  132 lb (59.875 kg)    Ideal Body Weight: 80.9 kg  % Ideal Body Weight: 73%  Wt Readings from Last 10 Encounters:  06/22/14 132 lb (59.875 kg)  06/01/14 130 lb 4.7 oz (59.1 kg)    Usual Body Weight: 130 lbs  % Usual Body Weight: 101%  BMI:  Body mass index is 17.9 kg/(m^2).  Estimated Nutritional Needs: Kcal: 2200-2400 Protein: 90-105g Fluid: ~2.0 L/day  Skin: intact  Diet Order: General  EDUCATION NEEDS: -Education needs addressed   Intake/Output Summary (Last 24 hours) at 06/22/14 1348 Last data filed at 06/22/14 0900  Gross per 24 hour  Intake 2137.5 ml  Output   1300 ml  Net  837.5 ml    Last BM: 9/24   Labs:   Recent Labs Lab 06/20/14 1543 06/21/14 0732 06/22/14 0547  NA 135* 136* 135*  K 4.0 4.1 3.9  CL 100 103 102  CO2 BUN 27* 24* 22  CREATININE 2.71* 2.61* 2.52*  CALCIUM 13.2* 12.8* 12.9*  MG  --  1.6  --   GLUCOSE 94 89 92    CBG (last 3)  No results found for this basename: GLUCAP,  in the last 72 hours  Scheduled Meds: . sodium chloride   Intravenous Once  . cholecalciferol  1,000 Units Oral Daily  . folic acid  1 mg Oral Daily  . HYDROmorphone PCA 0.3 mg/mL   Intravenous 6 times per day  . ketorolac  15 mg Intravenous 4 times per day  . multivitamin with minerals  1 tablet  Oral Daily  . senna-docusate  1 tablet Oral Daily  . sodium chloride  3 mL Intravenous Q12H    Continuous Infusions: . dextrose 5 % and 0.45% NaCl 1,000 mL (06/22/14 1033)    Past Medical History  Diagnosis Date  . Sickle cell anemia   . Aneurysm     Pt states happened in 2011, Brain     Past Surgical History  Procedure Laterality Date  . Lymph node biopsy      Loyce Dys, MS RD LDN Clinical Inpatient Dietitian Weekend/After hours pager: [phone]

## 2014-06-23 LAB — HEMOGLOBIN AND HEMATOCRIT, BLOOD
HCT: 18.2 % — ABNORMAL LOW (ref 39.0–52.0)
Hemoglobin: 6.3 g/dL — CL (ref 13.0–17.0)

## 2014-06-23 MED ORDER — SODIUM CHLORIDE 0.9 % IJ SOLN: 10.0000 mL | INTRAMUSCULAR | Status: DC | PRN

## 2014-06-23 MED ORDER — HEPARIN SOD (PORK) LOCK FLUSH 100 UNIT/ML IV SOLN
500.0000 [IU] | INTRAVENOUS | Status: DC | PRN
  Filled 2014-06-23: qty 5

## 2014-06-23 NOTE — Discharge Summary (Signed)
Physician Discharge Summary  Shawn Adams WUJ:[id number] DOB: 1975-10-06 DOA: 06/20/2014  PCP: Jackie Plum, MD  Admit date: 06/20/2014 Discharge date: 06/23/2014  Discharge Diagnoses:  Principal Problem:   Sickle cell anemia Active Problems:   Symptomatic anemia   CKD (chronic kidney disease) stage 3, GFR 30-59 ml/min   Leukocytosis   Sickle cell pain crisis   Hypercalcemia   Discharge Condition: stable  Disposition: Home Follow-up Information   Follow up with OSEI-BONSU,GEORGE, MD In 2 days. (Labs)    Specialty:  Internal Medicine   Contact information:   [phone] [place] [address] [phone]       Diet:Regular  Wt Readings from Last 3 Encounters:  06/23/14 132 lb (59.875 kg)  06/01/14 130 lb 4.7 oz (59.1 kg)    History of present illness:  38 year old male with past medical history of sickle cell disease, chronic kidney disease, sickle cell anemia who presented to [facility] ED 06/20/2014 with ongoing nausea, vomiting and diarrhea started one day prior to this admission. Patient reports that area completely resolved and at this time he feels nausea but no vomiting. Patient also reports worsening generalized pain typical for sickle cell crisis. His pain is 7/10 in intensity and was not relieved with analgesia at home. Patient is taking Percocet at home for pain relief. No chest pain, shortness of breath or palpitations. No fevers. No cough. No lightheadedness or loss of consciousness.  In ED, vitals are stable. Blood work revealed leukocytosis of 16.6, hemoglobin 5.2 and creatinine of 2.71. One unit of PRBC transfusion was ordered in ED. Patient was admitted for further management of sickle cell pain crisis and related anemia.   Hospital Course:  Sickle Cell Crisis: Patient presented with pain characteristic of acute vaso-occlusive crisis. Pt's pain was treated with bolus IV analgesics initially and was later transitioned with a Dilaudid PCA and  ketorolac. As his pain improved, Dilaudid PCA was titrated down and he was started on his home Percocet.  His pain was well controlled with his oral home regimen without much need for PCA. He had overall improvement of his pain and was physically functional upon discharge.  Anemia: Pt had a Hgb of 4.5 on admission. He required 2U of PRBC which brought his Hgb up to 6.5. He only complained of mild SOB, but vitals were stable without signs of hypoxia or ACS. His Hgb remained stable at 6.3 the morning of discharge. Pt was advised to have his labs checked in 2 days by PCP. Pt advised to go to ED if feeling dizzy, worsening SOB, Heart racing/palpitations, and/or bleeding  Discharge Exam:  Filed Vitals:   06/23/14 1001  BP: 125/71  Pulse: 64  Temp: 97.6 F (36.4 C)  Resp: 16   Filed Vitals:   06/23/14 0146 06/23/14 0424 06/23/14 0530 06/23/14 1001  BP: 142/58  128/68 125/71  Pulse: 60  65 64  Temp: 97.9 F (36.6 C)  98.4 F (36.9 C) 97.6 F (36.4 C)  TempSrc: Oral  Oral Oral  Resp: Height:      Weight:   132 lb (59.875 kg)   SpO2: 97% 100% 100% 98%   General: Alert, awake, oriented x3, in no acute distress.  HEENT: [place]/AT PEERL, EOMI Neck: Trachea midline,  no masses, no thyromegal,y no JVD, no carotid bruit OROPHARYNX:  Moist, No exudate/ erythema/lesions.  Heart: Regular rate and rhythm, without murmurs, rubs, gallops, PMI non-displaced, no heaves or thrills on palpation.  Lungs: Clear  to auscultation, no wheezing or rhonchi noted. No increased vocal fremitus resonant to percussion  Abdomen: Soft, nontender, nondistended, positive bowel sounds, no masses no hepatosplenomegaly noted..  Neuro: No focal neurological deficits noted cranial nerves II through XII grossly intact. Strength 5 out of 5 in bilateral upper and lower extremities. Nl gait. Musculoskeletal: No warm swelling or erythema around joints, no spinal tenderness noted. Psychiatric: Patient alert and oriented x3,  good insight and cognition, good recent to remote recall. Lymph node survey: No cervical axillary or inguinal lymphadenopathy noted.   Discharge Instructions   Pt advised to go to ED if feeling dizzy, worsening SOB, Heart racing/palpitations, and/or bleeding    Medication List         cholecalciferol 1000 UNITS tablet  Commonly known as:  VITAMIN D  Take 1,000 Units by mouth daily.     folic acid 1 MG tablet  Commonly known as:  FOLVITE  Take 1 mg by mouth daily. Added to Med Rec 9/6 per patient update, MD aware     multivitamin with minerals tablet  Take 1 tablet by mouth daily.     oxyCODONE-acetaminophen 5-325 MG per tablet  Commonly known as:  PERCOCET/ROXICET  Take 1 tablet by mouth every 4 (four) hours as needed for moderate pain.         The results of significant diagnostics from this hospitalization (including imaging, microbiology, ancillary and laboratory) are listed below for reference.    Significant Diagnostic Studies: Dg Chest 2 View  06/20/2014   CLINICAL DATA:  Sickle cell crisis with central chest pain shortness of breath and coughing as well as nausea and vomiting and diarrhea with onset since yesterday history of enlarged mediastinal lymph nodes and subsequent biopsy.  EXAM: CHEST  2 VIEW  COMPARISON:  Portable chest x-ray of May 31, 2014  FINDINGS: Bulky mediastinal and hilar lymph nodes persists. The cardiopericardial silhouette is normal in size. The pulmonary interstitial markings are mildly prominent though stable. There is no pleural effusion or pneumothorax. The observed bony thorax is unremarkable.  IMPRESSION: 1. There is no evidence of CHF. 2. Bulky mediastinal and hilar lymphadenopathy is present and stable. 3. Mild chronically increased interstitial markings may be the sequelae of the patient's sickle cell disease. One cannot exclude underlying COPD.   Electronically Signed   By: David  [place]   On: 06/20/2014 16:07   Dg Chest 2  View  05/31/2014   CLINICAL DATA:  Headaches for 3 months. Shortness of breath. Sickle cell anemia.  EXAM: CHEST  2 VIEW  COMPARISON:  09/03/2013  FINDINGS: Right paratracheal and bilateral hilar prominence suggesting mediastinal/hilar lymphadenopathy. Appearance is similar to previous study. Mild cardiac enlargement. No focal consolidation or airspace disease. No blunting of costophrenic angles. No pneumothorax.  IMPRESSION: Bilateral hilar and mediastinal lymphadenopathy is again demonstrated, similar to prior study. Cardiac enlargement. No focal consolidation.   Electronically Signed   By: [name] M.D.   On: 05/31/2014 00:01   Dg Chest Port 1 View  05/31/2014   CLINICAL DATA:  Right chest pain, shortness of breath, sickle cell  EXAM: PORTABLE CHEST - 1 VIEW  COMPARISON:  Chest radiograph dated 05/30/2014. CT chest dated 09/04/2013.  FINDINGS: Lungs are essentially clear. No focal consolidation. No pleural effusion or pneumothorax.  Cardiomegaly.  Mediastinal/hilar lymphadenopathy, unchanged.  IMPRESSION: Mediastinal/ hilar lymphadenopathy, unchanged.   Electronically Signed   By: Charline Bills M.D.   On: 05/31/2014 10:54    Microbiology: Recent Results (from the past  240 hour(s))  URINE CULTURE     Status: None   Collection Time    06/20/14  7:14 PM      Result Value Ref Range Status   Specimen Description URINE, CLEAN CATCH   Final   Special Requests NONE   Final   Culture  Setup Time     Final   Value: 06/21/2014 00:28     Performed at [facility] Count     Final   Value: NO GROWTH     Performed at [facility]   Culture     Final   Value: NO GROWTH     Performed at [facility]   Report Status 06/22/2014 FINAL   Final     Labs: Basic Metabolic Panel:  Recent Labs Lab 06/20/14 1543 06/21/14 0732 06/22/14 0547  NA 135* 136* 135*  K 4.0 4.1 3.9  CL 100 103 102  CO2 GLUCOSE 94 89 92  BUN 27* 24* 22  CREATININE 2.71*  2.61* 2.52*  CALCIUM 13.2* 12.8* 12.9*  MG  --  1.6  --    Liver Function Tests:  Recent Labs Lab 06/20/14 1543 06/21/14 0732  AST 76* 68*  ALT 49 43  ALKPHOS 159* 139*  BILITOT 3.3* 3.7*  PROT 8.2 7.2  ALBUMIN 3.8 3.3*   No results found for this basename: LIPASE, AMYLASE,  in the last 168 hours No results found for this basename: AMMONIA,  in the last 168 hours CBC:  Recent Labs Lab 06/20/14 1543 06/21/14 0732 06/22/14 0547 06/22/14 1438 06/23/14 0430  WBC 16.6* 14.6* 13.3*  --   --   NEUTROABS 10.1* 8.5* 6.2  --   --   HGB 5.2* 4.5* 5.5* 6.5* 6.3*  HCT 15.2* 13.0* 16.1* 18.6* 18.2*  MCV 84.0 83.9 84.3  --   --   PLT 766* 663* 631*  --   --      Recent Labs Lab 06/20/14 1842  FERRITIN 1951*    Time coordinating discharge: 30 min.  SignedSerina Cowper  06/23/2014, 12:01 PM

## 2014-06-24 LAB — TYPE AND SCREEN
ABO/RH(D): B POS
Antibody Screen: NEGATIVE
Donor AG Type: NEGATIVE
Donor AG Type: NEGATIVE
Donor AG Type: NEGATIVE
Donor AG Type: NEGATIVE
Unit division: 0
Unit division: 0
Unit division: 0
Unit division: 0

## 2014-06-26 NOTE — ED Provider Notes (Addendum)
CSN: [id number]     Arrival date & time 06/20/14  1521 History   First MD Initiated Contact with Patient 06/20/14 1525     Chief Complaint  Patient presents with  . Sickle Cell Pain Crisis  . Emesis  . Diarrhea     (Consider location/radiation/quality/duration/timing/severity/associated sxs/prior Treatment) HPI  38 year old male with past medical history of sickle cell disease, chronic kidney disease, sickle cell anemia who presented to EDwith ongoing nausea, vomiting and diarrhea started one day prior to this admission. Patient reports that area completely resolved and at this time he feels nausea but no vomiting. Patient also reports worsening generalized pain typical for sickle cell crisis. His pain is 7/10 in intensity and was not relieved with analgesia at home. No fevers or chills. Some shortness of breath which is exacerbated with activity. No dizziness or lightheadedness. Reports compliance with his medications.   Past Medical History  Diagnosis Date  . Sickle cell anemia   . Aneurysm     Pt states happened in 2011, Brain    Past Surgical History  Procedure Laterality Date  . Lymph node biopsy     History reviewed. No pertinent family history. History  Substance Use Topics  . Smoking status: Never Smoker   . Smokeless tobacco: Never Used  . Alcohol Use: 0.6 oz/week    1 Cans of beer per week     Comment: occasionally    Review of Systems  All systems reviewed and negative, other than as noted in HPI.   Allergies  Review of patient's allergies indicates no known allergies.  Home Medications   Prior to Admission medications   Medication Sig Start Date End Date Taking? Authorizing Provider  cholecalciferol (VITAMIN D) 1000 UNITS tablet Take 1,000 Units by mouth daily.   Yes Historical Provider, MD  folic acid (FOLVITE) 1 MG tablet Take 1 mg by mouth daily. Added to Med Rec 9/6 per patient update, MD aware   Yes Historical Provider, MD  Multiple  Vitamins-Minerals (MULTIVITAMIN WITH MINERALS) tablet Take 1 tablet by mouth daily.   Yes Historical Provider, MD  oxyCODONE-acetaminophen (PERCOCET) 5-325 MG per tablet Take 1 tablet by mouth every 4 (four) hours as needed for moderate pain.    Yes Historical Provider, MD   BP 125/71  Pulse 64  Temp(Src) 97.6 F (36.4 C) (Oral)  Resp 16  Ht 6' (1.829 m)  Wt 132 lb (59.875 kg)  BMI 17.90 kg/m2  SpO2 98% Physical Exam  Nursing note and vitals reviewed. Constitutional: He appears well-developed and well-nourished. No distress.  HENT:  Head: Normocephalic and atraumatic.  Eyes: Conjunctivae are normal. Right eye exhibits no discharge. Left eye exhibits no discharge.  Neck: Neck supple.  Cardiovascular: Normal rate, regular rhythm and normal heart sounds.  Exam reveals no gallop and no friction rub.   No murmur heard. Pulmonary/Chest: Effort normal and breath sounds normal. No respiratory distress.  Abdominal: Soft. He exhibits no distension. There is no tenderness.  Musculoskeletal: He exhibits no edema and no tenderness.  Lower extremities symmetric as compared to each other. No calf tenderness. Negative Homan's. No palpable cords.   Neurological: He is alert.  Skin: Skin is warm and dry.  Psychiatric: He has a normal mood and affect. His behavior is normal. Thought content normal.    ED Course  Procedures (including critical care time)  CRITICAL CARE Performed by: Raeford Razor  Total critical care time: 30 minutes  Critical care time was exclusive of separately billable procedures  and treating other patients. Critical care was necessary to treat or prevent imminent or life-threatening deterioration. Critical care was time spent personally by me on the following activities: development of treatment plan with patient and/or surrogate as well as nursing, discussions with consultants, evaluation of patient's response to treatment, examination of patient, obtaining history from  patient or surrogate, ordering and performing treatments and interventions, ordering and review of laboratory studies, ordering and review of radiographic studies, pulse oximetry and re-evaluation of patient's condition. car Labs Review Labs Reviewed  COMPREHENSIVE METABOLIC PANEL - Abnormal; Notable for the following:    Sodium 135 (*)    BUN 27 (*)    Creatinine, Ser 2.71 (*)    Calcium 13.2 (*)    AST 76 (*)    Alkaline Phosphatase 159 (*)    Total Bilirubin 3.3 (*)    GFR calc non Af Amer 28 (*)    GFR calc Af Amer 33 (*)    All other components within normal limits  CBC WITH DIFFERENTIAL - Abnormal; Notable for the following:    WBC 16.6 (*)    RBC 1.81 (*)    Hemoglobin 5.2 (*)    HCT 15.2 (*)    RDW 19.5 (*)    Platelets 766 (*)    Neutro Abs 10.1 (*)    Lymphs Abs 4.5 (*)    Monocytes Absolute 1.5 (*)    All other components within normal limits  RETICULOCYTES - Abnormal; Notable for the following:    Retic Ct Pct 14.2 (*)    RBC. 1.81 (*)    Retic Count, Manual 257.0 (*)    All other components within normal limits  LACTATE DEHYDROGENASE - Abnormal; Notable for the following:    LDH 387 (*)    All other components within normal limits  FERRITIN - Abnormal; Notable for the following:    Ferritin 1951 (*)    All other components within normal limits  HAPTOGLOBIN - Abnormal; Notable for the following:    Haptoglobin <25 (*)    All other components within normal limits  URINALYSIS, ROUTINE W REFLEX MICROSCOPIC - Abnormal; Notable for the following:    Hgb urine dipstick TRACE (*)    Protein, ur 30 (*)    Leukocytes, UA MODERATE (*)    All other components within normal limits  URINE MICROSCOPIC-ADD ON - Abnormal; Notable for the following:    Bacteria, UA FEW (*)    Crystals CA OXALATE CRYSTALS (*)    All other components within normal limits  COMPREHENSIVE METABOLIC PANEL - Abnormal; Notable for the following:    Sodium 136 (*)    BUN 24 (*)    Creatinine, Ser  2.61 (*)    Calcium 12.8 (*)    Albumin 3.3 (*)    AST 68 (*)    Alkaline Phosphatase 139 (*)    Total Bilirubin 3.7 (*)    GFR calc non Af Amer 30 (*)    GFR calc Af Amer 34 (*)    All other components within normal limits  CBC WITH DIFFERENTIAL - Abnormal; Notable for the following:    WBC 14.6 (*)    RBC 1.55 (*)    Hemoglobin 4.5 (*)    HCT 13.0 (*)    RDW 20.3 (*)    Platelets 663 (*)    Neutro Abs 8.5 (*)    Monocytes Absolute 1.4 (*)    Eosinophils Relative 6 (*)    Eosinophils Absolute 0.9 (*)  All other components within normal limits  RETICULOCYTES - Abnormal; Notable for the following:    Retic Ct Pct 14.2 (*)    RBC. 1.55 (*)    Retic Count, Manual 220.1 (*)    All other components within normal limits  CBC WITH DIFFERENTIAL - Abnormal; Notable for the following:    WBC 13.3 (*)    RBC 1.91 (*)    Hemoglobin 5.5 (*)    HCT 16.1 (*)    RDW 18.5 (*)    Platelets 631 (*)    Eosinophils Relative 8 (*)    Lymphs Abs 4.5 (*)    Monocytes Absolute 1.5 (*)    Eosinophils Absolute 1.1 (*)    All other components within normal limits  RETICULOCYTES - Abnormal; Notable for the following:    Retic Ct Pct 12.9 (*)    RBC. 1.91 (*)    Retic Count, Manual 246.4 (*)    All other components within normal limits  BASIC METABOLIC PANEL - Abnormal; Notable for the following:    Sodium 135 (*)    Creatinine, Ser 2.52 (*)    Calcium 12.9 (*)    GFR calc non Af Amer 31 (*)    GFR calc Af Amer 36 (*)    All other components within normal limits  LACTATE DEHYDROGENASE - Abnormal; Notable for the following:    LDH 354 (*)    All other components within normal limits  HEMOGLOBIN AND HEMATOCRIT, BLOOD - Abnormal; Notable for the following:    Hemoglobin 6.5 (*)    HCT 18.6 (*)    All other components within normal limits  HEMOGLOBIN AND HEMATOCRIT, BLOOD - Abnormal; Notable for the following:    Hemoglobin 6.3 (*)    HCT 18.2 (*)    All other components within normal  limits  URINE CULTURE  ETHANOL  URINE RAPID DRUG SCREEN (HOSP PERFORMED)  MAGNESIUM  TYPE AND SCREEN  PREPARE RBC (CROSSMATCH)  ABO/RH  PREPARE RBC (CROSSMATCH)    Imaging Review No results found.   EKG Interpretation   Date/Time:  Friday June 20 2014 15:31:54 EDT Ventricular Rate:  83 PR Interval:  185 QRS Duration: 96 QT Interval:  338 QTC Calculation: 397 R Axis:   32 Text Interpretation:  Sinus rhythm Lateral infarct, acute (LAD) ST  elevation, consider inferior injury ED PHYSICIAN INTERPRETATION AVAILABLE  IN [facility] Confirmed by TEST, Record ([id number]) on 06/22/2014 8:42:48  AM      MDM   Final diagnoses:  Symptomatic anemia  Sickle cell anemia with pain  Hypercalcemia    38 year old male symptomatic anemia. Will transfuse. Admission.    Raeford Razor, MD 06/26/14 1342  Raeford Razor, MD 07/10/14 [phone]

## 2014-06-30 DIAGNOSIS — D5780 Other sickle-cell disorders without crisis: Secondary | ICD-10-CM | POA: Diagnosis not present

## 2014-06-30 DIAGNOSIS — D571 Sickle-cell disease without crisis: Secondary | ICD-10-CM | POA: Diagnosis not present

## 2014-07-03 DIAGNOSIS — D571 Sickle-cell disease without crisis: Secondary | ICD-10-CM | POA: Diagnosis not present

## 2014-07-07 DIAGNOSIS — D571 Sickle-cell disease without crisis: Secondary | ICD-10-CM | POA: Diagnosis not present

## 2014-07-07 DIAGNOSIS — G8929 Other chronic pain: Secondary | ICD-10-CM | POA: Diagnosis not present

## 2014-07-07 DIAGNOSIS — D5780 Other sickle-cell disorders without crisis: Secondary | ICD-10-CM | POA: Diagnosis not present

## 2014-07-31 DIAGNOSIS — J4540 Moderate persistent asthma, uncomplicated: Secondary | ICD-10-CM | POA: Diagnosis not present

## 2014-07-31 DIAGNOSIS — D71 Functional disorders of polymorphonuclear neutrophils: Secondary | ICD-10-CM | POA: Diagnosis not present

## 2014-07-31 DIAGNOSIS — J988 Other specified respiratory disorders: Secondary | ICD-10-CM | POA: Diagnosis not present

## 2014-07-31 DIAGNOSIS — Z862 Personal history of diseases of the blood and blood-forming organs and certain disorders involving the immune mechanism: Secondary | ICD-10-CM | POA: Diagnosis not present

## 2014-07-31 DIAGNOSIS — Z87891 Personal history of nicotine dependence: Secondary | ICD-10-CM | POA: Diagnosis not present

## 2014-08-27 DIAGNOSIS — K219 Gastro-esophageal reflux disease without esophagitis: Secondary | ICD-10-CM | POA: Diagnosis not present

## 2014-08-27 DIAGNOSIS — R918 Other nonspecific abnormal finding of lung field: Secondary | ICD-10-CM | POA: Diagnosis not present

## 2014-08-27 DIAGNOSIS — N289 Disorder of kidney and ureter, unspecified: Secondary | ICD-10-CM | POA: Diagnosis not present

## 2014-08-27 DIAGNOSIS — Z79899 Other long term (current) drug therapy: Secondary | ICD-10-CM | POA: Diagnosis not present

## 2014-08-27 DIAGNOSIS — R599 Enlarged lymph nodes, unspecified: Secondary | ICD-10-CM | POA: Diagnosis not present

## 2014-08-27 DIAGNOSIS — Z87891 Personal history of nicotine dependence: Secondary | ICD-10-CM | POA: Diagnosis not present

## 2014-08-27 DIAGNOSIS — N183 Chronic kidney disease, stage 3 (moderate): Secondary | ICD-10-CM | POA: Diagnosis not present

## 2014-08-27 DIAGNOSIS — D571 Sickle-cell disease without crisis: Secondary | ICD-10-CM | POA: Diagnosis not present

## 2014-09-16 ENCOUNTER — Institutional Professional Consult (permissible substitution): Payer: PRIVATE HEALTH INSURANCE | Admitting: Pulmonary Disease

## 2014-10-09 DIAGNOSIS — D571 Sickle-cell disease without crisis: Secondary | ICD-10-CM | POA: Diagnosis not present

## 2014-10-09 DIAGNOSIS — F329 Major depressive disorder, single episode, unspecified: Secondary | ICD-10-CM | POA: Diagnosis not present

## 2014-10-09 DIAGNOSIS — E559 Vitamin D deficiency, unspecified: Secondary | ICD-10-CM | POA: Diagnosis not present

## 2014-10-09 DIAGNOSIS — G8929 Other chronic pain: Secondary | ICD-10-CM | POA: Diagnosis not present

## 2014-11-20 DIAGNOSIS — D869 Sarcoidosis, unspecified: Secondary | ICD-10-CM | POA: Diagnosis not present

## 2014-11-20 DIAGNOSIS — D571 Sickle-cell disease without crisis: Secondary | ICD-10-CM | POA: Diagnosis not present

## 2014-11-20 DIAGNOSIS — D631 Anemia in chronic kidney disease: Secondary | ICD-10-CM | POA: Diagnosis not present

## 2014-11-20 DIAGNOSIS — R809 Proteinuria, unspecified: Secondary | ICD-10-CM | POA: Diagnosis not present

## 2014-11-20 DIAGNOSIS — N183 Chronic kidney disease, stage 3 (moderate): Secondary | ICD-10-CM | POA: Diagnosis not present

## 2014-11-20 DIAGNOSIS — Z7952 Long term (current) use of systemic steroids: Secondary | ICD-10-CM | POA: Diagnosis not present

## 2014-12-01 ENCOUNTER — Emergency Department (HOSPITAL_COMMUNITY): Payer: Medicare Other

## 2014-12-01 ENCOUNTER — Emergency Department (HOSPITAL_COMMUNITY)
Admission: EM | Admit: 2014-12-01 | Discharge: 2014-12-01 | Disposition: A | Discharge status: Home / Self Care | Payer: Medicare Other | Attending: Emergency Medicine | Admitting: Emergency Medicine

## 2014-12-01 ENCOUNTER — Encounter (HOSPITAL_COMMUNITY): Payer: Self-pay | Admitting: Emergency Medicine

## 2014-12-01 DIAGNOSIS — Z8679 Personal history of other diseases of the circulatory system: Secondary | ICD-10-CM | POA: Insufficient documentation

## 2014-12-01 DIAGNOSIS — J42 Unspecified chronic bronchitis: Secondary | ICD-10-CM | POA: Insufficient documentation

## 2014-12-01 DIAGNOSIS — Z862 Personal history of diseases of the blood and blood-forming organs and certain disorders involving the immune mechanism: Secondary | ICD-10-CM | POA: Diagnosis not present

## 2014-12-01 DIAGNOSIS — R05 Cough: Secondary | ICD-10-CM | POA: Diagnosis not present

## 2014-12-01 DIAGNOSIS — Z79899 Other long term (current) drug therapy: Secondary | ICD-10-CM | POA: Insufficient documentation

## 2014-12-01 MED ORDER — PREDNISONE 20 MG PO TABS: ORAL_TABLET | ORAL | Status: DC

## 2014-12-01 MED ORDER — BENZONATATE 100 MG PO CAPS: 100.0000 mg | ORAL_CAPSULE | Freq: Three times a day (TID) | ORAL | Status: DC | PRN

## 2014-12-01 MED ORDER — GUAIFENESIN 100 MG/5ML PO LIQD: 100.0000 mg | ORAL | Status: DC | PRN

## 2014-12-01 NOTE — ED Provider Notes (Signed)
CSN: [id number]     Arrival date & time 12/01/14  0957 History   First MD Initiated Contact with Patient 12/01/14 1036     Chief Complaint  Patient presents with  . URI     (Consider location/radiation/quality/duration/timing/severity/associated sxs/prior Treatment) HPI   39 year old male presents for evaluation of cough and congestion. Patient admits he has history of asthma, using his inhaler every 4 hours on a regular basis. For the past 2 months he has had persistent cough productive with yellow phlegm, also endorsed increased shortness of breath sometime with an elevation in some time with laying down. Shortness of breath improves with using inhaler. He also endorsed wheezing, and sneezing. Complaining of nasal congestion, and swollen lymph nodes. Report tactile fever which has resolved. States that he has been seen by his doctor for this complaint but no specific treatment was given. He felt symptoms can worsen within this past week. He denies having sore throat, severe headache, neck stiffness, chest pain, hemoptysis, abdominal cramping, rash. No prior complication with asthma requiring intubation and ICU stay.  Past Medical History  Diagnosis Date  . Sickle cell anemia   . Aneurysm     Pt states happened in 2011, Brain    Past Surgical History  Procedure Laterality Date  . Lymph node biopsy     No family history on file. History  Substance Use Topics  . Smoking status: Never Smoker   . Smokeless tobacco: Never Used  . Alcohol Use: 0.6 oz/week    1 Cans of beer per week     Comment: occasionally    Review of Systems  All other systems reviewed and are negative.     Allergies  Review of patient's allergies indicates no known allergies.  Home Medications   Prior to Admission medications   Medication Sig Start Date End Date Taking? Authorizing Provider  cholecalciferol (VITAMIN D) 1000 UNITS tablet Take 1,000 Units by mouth daily.    Historical Provider, MD  folic  acid (FOLVITE) 1 MG tablet Take 1 mg by mouth daily. Added to Med Rec 9/6 per patient update, MD aware    Historical Provider, MD  Multiple Vitamins-Minerals (MULTIVITAMIN WITH MINERALS) tablet Take 1 tablet by mouth daily.    Historical Provider, MD  oxyCODONE-acetaminophen (PERCOCET) 5-325 MG per tablet Take 1 tablet by mouth every 4 (four) hours as needed for moderate pain.     Historical Provider, MD   BP 131/70 mmHg  Pulse 78  Temp(Src) 97.7 F (36.5 C) (Oral)  Resp 18  SpO2 95% Physical Exam  Constitutional: He is oriented to person, place, and time. He appears well-developed and well-nourished. No distress.  HENT:  Head: Atraumatic.  Right Ear: External ear normal.  Left Ear: External ear normal.  Mouth/Throat: Oropharynx is clear and moist.  Eyes: Conjunctivae are normal.  Neck: Normal range of motion. Neck supple.  No nuchal rigidity  Cardiovascular: Normal rate, regular rhythm and intact distal pulses.   Pulmonary/Chest: Effort normal and breath sounds normal. No respiratory distress. He has no wheezes. He has no rales. He exhibits no tenderness.  Abdominal: Soft. There is no tenderness.  Lymphadenopathy:    He has no cervical adenopathy.  Neurological: He is alert and oriented to person, place, and time.  Skin: No rash noted.  Psychiatric: He has a normal mood and affect.    ED Course  Procedures (including critical care time)  Patient with URI symptoms, with an underlying asthma history. He is afebrile, vital signs  stable, no hypoxia, in no acute respiratory distress and no wheezing on exam. No obvious signs of infection noted.  11:01 AM Chest x-ray shows evidence of chronic bronchitis in the setting of sickle cell disease. Will prescribe steroid, cough medication, and decongestant. Patient has inhaler at home to use as needed. Return precautions discussed. Patient to follow-up with PCP for further care.  Labs Review Labs Reviewed - No data to display  Imaging  Review Dg Chest 2 View  12/01/2014   CLINICAL DATA:  Sickle cell disease. Cough and congestion. Short of breath.  EXAM: CHEST  2 VIEW  COMPARISON:  Chest radiograph 06/20/2014, CT thorax 09/04/2013  FINDINGS: There is bulky bilateral hilar and paratracheal lymphadenopathy not changed from comparison radiograph and well demonstrated on CT of 09/04/2013.  There is mild bronchitic markings centrally. No focal infiltrate. No pleural fluid.  IMPRESSION: 1. No evidence of acute pneumonia. 2. Chronic bronchitic in pulmonary markings of sickle cell disease. 3. Bulky bilateral hilar and mediastinal lymphadenopathy not changed over multiple comparison exams.   Electronically Signed   By: Genevive BiStewart  Edmunds M.D.   On: 12/01/2014 10:52     EKG Interpretation None      MDM   Final diagnoses:  Chronic bronchitis, unspecified chronic bronchitis type    BP 131/70 mmHg  Pulse 78  Temp(Src) 97.7 F (36.5 C) (Oral)  Resp 18  SpO2 95%  I have reviewed nursing notes and vital signs. I personally reviewed the imaging tests through PACS system  I reviewed available ER/hospitalization records thought the EMR     Fayrene HelperBowie Maresha Anastos, PA-C 12/01/14 1102  Doug SouSam Jacubowitz, MD 12/01/14 1743

## 2014-12-01 NOTE — Discharge Instructions (Signed)
Chronic Asthmatic Bronchitis °Chronic asthmatic bronchitis is a complication of persistent asthma. After a period of time with asthma, some people develop airflow obstruction that is present all the time, even when not having an asthma attack. There is also persistent inflammation of the airways, and the bronchial tubes produce more mucus. Chronic asthmatic bronchitis usually is a permanent problem with the lungs. °CAUSES  °Chronic asthmatic bronchitis happens most often in people who have asthma and also smoke cigarettes. Occasionally, it can happen to a person with long-standing or severe asthma even if the person is not a smoker. °SIGNS AND SYMPTOMS  °Chronic asthmatic bronchitis usually causes symptoms of both asthma and chronic bronchitis, including:  °· Coughing. °· Increased sputum production. °· Wheezing and shortness of breath. °· Chest discomfort. °· Recurring infections. °DIAGNOSIS  °Your health care provider will take a medical history and perform a physical exam. Chronic asthmatic bronchitis is suspected when a person with asthma has abnormal results on breathing tests (pulmonary function tests) even when breathing symptoms are at their best. Other tests, such as a chest X-ray, may be performed to rule out other conditions.  °TREATMENT  °Treatment involves controlling symptoms with medicine and lifestyle changes. °· Your health care provider may prescribe asthma medicines, including inhaler and nebulizer medicines. °· Infection can be treated with medicine to kill germs (antibiotics). Serious infections may require hospitalization. These can include: °¨ Pneumonia. °¨ Sinus infections. °¨ Acute bronchitis.   °· Preventing infection and hospitalization is very important. Get an influenza vaccination every year as directed by your health care provider. Ask your health care provider whether you need a pneumonia vaccine. °· Ask your health care provider whether you would benefit from a pulmonary  rehabilitation program. °HOME CARE INSTRUCTIONS °· Take medicines only as directed by your health care provider. °· If you are a cigarette smoker, the most important thing that you can do is quit. Talk to your health care provider for help with quitting smoking. °· Avoid pollen, dust, animal dander, molds, smoke, and other things that cause attacks. °· Regular exercise is very important to help you feel better. Discuss possible exercise routines with your health care provider. °· If animal dander is the cause of asthma, you may not be able to keep pets. °· It is important that you: °¨ Become educated about your medical condition. °¨ Participate in maintaining wellness. °¨ Seek medical care as directed. Delay in seeking medical care could cause permanent injury and may be a risk to your life. °SEEK MEDICAL CARE IF: °· You have wheezing and shortness of breath even if taking medicine to prevent attacks. °· You have muscle aches, chest pain, or thickening of sputum. °· Your sputum changes from clear or white to yellow, green, gray, or bloody. °SEEK IMMEDIATE MEDICAL CARE IF: °· Your usual medicines do not stop your wheezing. °· You have increased coughing or shortness of breath or both. °· You have increased difficulty breathing. °· You have any problems from the medicine you are taking, such as a rash, itching, swelling, or trouble breathing. °MAKE SURE YOU:  °· Understand these instructions. °· Will watch your condition. °· Will get help right away if you are not doing well or get worse. °Document Released: 06/30/2006 Document Revised: 01/27/2014 Document Reviewed: 10/21/2013 °ExitCare® Patient Information ©2015 [employer]. This information is not intended to replace advice given to you by your health care provider. Make sure you discuss any questions you have with your health care provider. ° °Emergency Department Resource   Guide °1) Find a Doctor and Pay Out of Pocket °Although you won't have to find out who is  covered by your insurance plan, it is a good idea to ask around and get recommendations. You will then need to call the office and see if the doctor you have chosen will accept you as a new patient and what types of options they offer for patients who are self-pay. Some doctors offer discounts or will set up payment plans for their patients who do not have insurance, but you will need to ask so you aren't surprised when you get to your appointment. ° °2) Contact Your Local Health Department °Not all health departments have doctors that can see patients for sick visits, but many do, so it is worth a call to see if yours does. If you don't know where your local health department is, you can check in your phone book. The CDC also has a tool to help you locate your state's health department, and many state websites also have listings of all of their local health departments. ° °3) Find a Walk-in Clinic °If your illness is not likely to be very severe or complicated, you may want to try a walk in clinic. These are popping up all over the country in pharmacies, drugstores, and shopping centers. They're usually staffed by nurse practitioners or physician assistants that have been trained to treat common illnesses and complaints. They're usually fairly quick and inexpensive. However, if you have serious medical issues or chronic medical problems, these are probably not your best option. ° °No Primary Care Doctor: °- Call [facility] at  832-8000 - they can help you locate a primary care doctor that  accepts your insurance, provides certain services, etc. °- Physician Referral Service- 1-800-533-3463 ° °Chronic Pain Problems: °Organization         Address  Phone   Notes  °[facility]  (336) 297-2271 Patients need to be referred by their primary care doctor.  ° °Medication Assistance: °Organization         Address  Phone   Notes  °[place] [facility] [address] °[place], [place] [address] (336) 641-8030 --Must be a resident of [place] °-- Must have NO insurance coverage whatsoever (no Medicaid/ Medicare, etc.) °-- The pt. MUST have a primary care doctor that directs their care regularly and follows them in the community °  °[facility]  (866) 331-1348   °[employer]  (888) 892-1162   ° °Agencies that provide inexpensive medical care: °Organization         Address  Phone   Notes  °[facility]  (336) 832-8035   °[facility]    (336) 832-7272   °[facility] [address] °[place], [place] [address] (336) 832-4777   °[facility] [address], °[place] (336) 271-4999   °[facility]    (336) 373-0678   °[facility]    (336) 272-1050   °[facility] ° [address], [place] Phone:  (336) 832-4444, Fax:  (336) 832-4440 Hours of Operation:  9 am - 6 pm, M-F.  Also accepts Medicaid/Medicare and self-pay.  °[facility] ° [address],  Phone: (336) 832-3150, Fax: (336) 832-3151. Hours of Operation:  8:30 am - 5:30 pm, M-F.  Also accepts Medicaid and self-pay.  °[facility] [place] [address],   [place] Phone: (336) 878-6027   °[facility] [address], [place], [place] (336)723-1848, Ext. 123 Mondays & Thursdays: 7-9 AM.  First 15 patients are seen on a first come, first serve basis. °  ° °Medicaid-accepting [place] Providers: ° °Organization         Address  Phone   Notes  °[facility] [address], [place] (336) 641-2100 Also accepts self-pay patients.  °[facility] [address], [place] ° (336) 856-9996   °[facility] [address], [place] (336) 288-8857   °[facility] [address] [place] [address], [place] (336) 299-7000   °Veita Bland [address], [place]  ° (336) 373-1557 Only accepts [place] Access Medicaid patients after they have their name applied to their card.  ° °Self-Pay (no insurance) in [place]: ° °Organization         Address  Phone   Notes  °Sickle Cell Patients, [facility] [address], [place] (336) 832-1970   °[facility] [address], [place] (336) 832-4400   °[facility] ° [address], [place] (336) 992-4800   °[facility]/Dr. Osei-Bonsu ° [address] [place] [address], [place] or [address], [place] (336) 841-8500 Phone number for both [place] and [place] locations is the same.  °[facility] [address], [place] (336) 299-0000   °[facility] [address] [place] [address], [place] or [address] (336) 852-7530 °(336) 878-2260   °[facility] [address], [place] (336) 350-1642, phone; (336) 294-5005, fax Sees patients 1st and 3rd Saturday of every month.  Must not qualify for public or private insurance (i.e. Medicaid, Medicare, [place] Health Choice, Veterans' Benefits) • Household income should be no more than 200% of the poverty level •The clinic cannot treat you if you are pregnant or think you are pregnant • Sexually transmitted diseases are not treated at the clinic.  ° ° °Dental Care: °Organization         Address  Phone  Notes  °[facility] [facility] [address], [place] (336) 641-6152 Accepts children up to age 21 who are enrolled in Medicaid or [place] Health Choice; pregnant women with a Medicaid card; and children who have applied for Medicaid or [place] Health Choice, but were declined, whose parents can pay a reduced fee at time of service.  °[facility] [place]  [address], [place] (336) 641-7733 Accepts children up to age 21 who are enrolled  in Medicaid or [place] Health Choice; pregnant women with a Medicaid card; and children who have applied for Medicaid or [place] Health Choice, but were declined, whose parents can pay a reduced fee at time of service.  °[facility] ° [address], [place] (336) 641-4533 Patients are seen by appointment only. Walk-ins are not accepted. [facility] will see patients 18 years of age and older. °Monday - Tuesday (8am-5pm) °Most Wednesdays (8:30-5pm) °$30 per visit, cash only  °[facility] ° [address], [place] (336) 641-4533 Patients are seen by appointment only. Walk-ins are not accepted. [facility] will see patients 18 years of age and older. °One Wednesday Evening (  Monthly: Volunteer Based).  $30 per visit, cash only  °[facility]  (919) 537-3737 for adults; Children under age 4, call Graduate Pediatric Dentistry at ([phone]) [phone]. Children aged 4-14, please call ([phone]) [phone] to request a pediatric application. ° Dental services are provided in all areas of dental care including fillings, crowns and bridges, complete and partial dentures, implants, gum treatment, root canals, and extractions. Preventive care is also provided. Treatment is provided to both adults and children. °Patients are selected via a lottery and there is often a waiting list. °  °[facility] [address], °[place] ° (336) 763-8833 [email or web address] °  °[facility] [address], [place], [place] (336)723-1848, Ext. 123 Second and Fourth Thursday of each month, opens at 6:30 AM; Clinic ends at 9 AM.  Patients are seen on a first-come first-served basis, and a limited number are seen during each clinic.  ° °[facility] ° [address], [place], [place] (336) 723-7904   Eligibility Requirements °You must have lived in [place], [place], or [place] counties for at least the last three months. °  You cannot be  eligible for state or federal sponsored healthcare insurance, including Veterans Administration, Medicaid, or Medicare. °  You generally cannot be eligible for healthcare insurance through your employer.  °  How to apply: °Eligibility screenings are held every Tuesday and Wednesday afternoon from 1:00 pm until 4:00 pm. You do not need an appointment for the interview!  °[facility] [address], [place], [place] 336-631-2330   °[facility]  336-342-8273   °[facility]  336-703-3100   °[facility]  336-570-6415   ° °Behavioral Health Resources in the Community: °Intensive Outpatient Programs °Organization         Address  Phone  Notes  °[facility] [address], [place], [place] 336-878-6098   °[facility] [address], [place], [place] 336-832-9800   °[facility] [address], [place], [place] ° 336-882-2125   °[facility] [address],  °[place], [place] 1-800-853-5163 or 336-641-4981   °Substance Abuse Resources °Organization         Address  Phone  Notes  °[facility]  336-882-2125   °[facility]  336-784-9470   °[facility]  336-285-9073   °[facility]  336-845-3988   °Residential & Outpatient Substance Abuse Program  1-800-659-3381   °Psychological Services °Organization         Address  Phone  Notes  ° Health  336- 832-9600   °[facility]  336- 378-7881   °[facility] [address], [place] 1-800-853-5163 or 336-641-4981   ° °Mobile Crisis Teams °Organization         Address  Phone  Notes  °[facility], Mobile Crisis Care Unit  1-877-626-1772   °Assertive °[facility] ° [address] [place], [place] 336-834-9664   °Sharon DeEsch [address] °[place] 336-554-5454   ° °Self-Help/Support Groups °Organization          Address  Phone             Notes  °[facility] - variety of support groups  336- 373-1402 Call for more information  °Narcotics Anonymous (NA), [facility] [address], °[place]  2 meetings at this location  ° °  Residential Treatment Programs °Organization         Address  Phone  Notes  °[facility] [address],    °[place]  1-866-801-8205   °[facility] ° [address], [place], [place] 704-293-8524   °[facility] [address], [place] 336-845-3988 Admissions: 8am-3pm M-F  °[facility] [address]    °[place], [place] 336-841-1104   °[facility] [address], [place], [place] 336-379-7146   °[facility] [address]  °[place], [place] 336-285-9073   °[facility] Intensive Outpatient [address], [place], [place] 336-852-3033   °[facility] [address]  °[place], [place] 1-877-615-2722 or 336-784-9470   °[facility] [address] [place], [place] 336-227-7417 Accepts Medicaid  °[facility] [address]  °[place] 1-800-659-3381 Substance Abuse/Addiction Treatment  ° °[place] Behavioral Health Resources °Organization         Address  Phone  Notes  °[facility]  (888) 581-9988   °Julie Brannon, PhD [address] [place], [place]   (336) 349-5553 or (336) 951-0000   °[facility]   [address] °[place], [place] (336) 349-4454   °[facility] [address], [place], [place] (336) 342-8316 Insurance/Medicaid/sponsorship through [facility]  °[facility] [address]                                    [place], [place] (336) 342-8316 Therapy/tele-psych/case  °[facility] [address]  ° [place], [place] (336) 349-2233    °Dr. Arfeen  (336) 349-4544   °[facility] [place]  [employer]  [place] [facility] 1) [address], [place] °2) [address], [place] °3)  [address], [place] (336) 349-3220 °(336) 342-7768 ° °(336) 342-8140   °[place] [facility] (336) 342-1394 or (336) 342-3537 (After Hours)    ° ° °

## 2014-12-01 NOTE — ED Notes (Addendum)
Per pt, congestion, cough on and off for 2 weeks-chest congestion

## 2014-12-07 ENCOUNTER — Encounter (HOSPITAL_COMMUNITY): Payer: Self-pay | Admitting: Emergency Medicine

## 2014-12-07 ENCOUNTER — Emergency Department (HOSPITAL_COMMUNITY): Payer: Medicare Other

## 2014-12-07 ENCOUNTER — Inpatient Hospital Stay (HOSPITAL_COMMUNITY)
Admission: EM | Admit: 2014-12-07 | Discharge: 2014-12-09 | DRG: 812 | Disposition: A | Discharge status: Home / Self Care | Payer: Medicare Other | Attending: Internal Medicine | Admitting: Internal Medicine

## 2014-12-07 ENCOUNTER — Emergency Department (HOSPITAL_COMMUNITY)
Admission: EM | Admit: 2014-12-07 | Discharge: 2014-12-07 | Disposition: A | Discharge status: Home / Self Care | Payer: Medicare Other | Source: Home / Self Care

## 2014-12-07 DIAGNOSIS — D638 Anemia in other chronic diseases classified elsewhere: Secondary | ICD-10-CM | POA: Diagnosis present

## 2014-12-07 DIAGNOSIS — D571 Sickle-cell disease without crisis: Secondary | ICD-10-CM | POA: Diagnosis not present

## 2014-12-07 DIAGNOSIS — N183 Chronic kidney disease, stage 3 (moderate): Secondary | ICD-10-CM

## 2014-12-07 DIAGNOSIS — M545 Low back pain, unspecified: Secondary | ICD-10-CM

## 2014-12-07 DIAGNOSIS — R0602 Shortness of breath: Secondary | ICD-10-CM | POA: Diagnosis not present

## 2014-12-07 DIAGNOSIS — Z79899 Other long term (current) drug therapy: Secondary | ICD-10-CM | POA: Diagnosis not present

## 2014-12-07 DIAGNOSIS — D5700 Hb-SS disease with crisis, unspecified: Principal | ICD-10-CM

## 2014-12-07 DIAGNOSIS — J208 Acute bronchitis due to other specified organisms: Secondary | ICD-10-CM | POA: Diagnosis not present

## 2014-12-07 DIAGNOSIS — D869 Sarcoidosis, unspecified: Secondary | ICD-10-CM | POA: Diagnosis present

## 2014-12-07 DIAGNOSIS — N182 Chronic kidney disease, stage 2 (mild): Secondary | ICD-10-CM | POA: Diagnosis present

## 2014-12-07 DIAGNOSIS — Z7952 Long term (current) use of systemic steroids: Secondary | ICD-10-CM | POA: Diagnosis not present

## 2014-12-07 DIAGNOSIS — N179 Acute kidney failure, unspecified: Secondary | ICD-10-CM | POA: Diagnosis present

## 2014-12-07 LAB — CBC WITH DIFFERENTIAL/PLATELET
BASOS ABS: 0 10*3/uL (ref 0.0–0.1)
Band Neutrophils: 0 % (ref 0–10)
Basophils Relative: 0 % (ref 0–1)
Blasts: 0 %
EOS ABS: 0 10*3/uL (ref 0.0–0.7)
Eosinophils Relative: 0 % (ref 0–5)
HEMATOCRIT: 15.7 % — AB (ref 39.0–52.0)
HEMOGLOBIN: 5.8 g/dL — AB (ref 13.0–17.0)
LYMPHS ABS: 2.1 10*3/uL (ref 0.7–4.0)
Lymphocytes Relative: 17 % (ref 12–46)
MCH: 38.4 pg — ABNORMAL HIGH (ref 26.0–34.0)
MCHC: 36.9 g/dL — ABNORMAL HIGH (ref 30.0–36.0)
MCV: 104 fL — AB (ref 78.0–100.0)
MONOS PCT: 4 % (ref 3–12)
Metamyelocytes Relative: 0 %
Monocytes Absolute: 0.5 10*3/uL (ref 0.1–1.0)
Myelocytes: 0 %
Neutro Abs: 9.7 10*3/uL — ABNORMAL HIGH (ref 1.7–7.7)
Neutrophils Relative %: 79 % — ABNORMAL HIGH (ref 43–77)
PROMYELOCYTES ABS: 0 %
Platelets: 278 10*3/uL (ref 150–400)
RBC: 1.51 MIL/uL — ABNORMAL LOW (ref 4.22–5.81)
RDW: 22.6 % — ABNORMAL HIGH (ref 11.5–15.5)
WBC: 12.3 10*3/uL — ABNORMAL HIGH (ref 4.0–10.5)
nRBC: 0 /100 WBC

## 2014-12-07 LAB — RETICULOCYTES
RBC.: 1.51 MIL/uL — AB (ref 4.22–5.81)
RETIC COUNT ABSOLUTE: 279.4 10*3/uL — AB (ref 19.0–186.0)
Retic Ct Pct: 18.5 % — ABNORMAL HIGH (ref 0.4–3.1)

## 2014-12-07 LAB — BASIC METABOLIC PANEL
ANION GAP: 9 (ref 5–15)
BUN: 27 mg/dL — ABNORMAL HIGH (ref 6–23)
CALCIUM: 8.8 mg/dL (ref 8.4–10.5)
CO2: 17 mmol/L — AB (ref 19–32)
Chloride: 110 mmol/L (ref 96–112)
Creatinine, Ser: 1.68 mg/dL — ABNORMAL HIGH (ref 0.50–1.35)
GFR calc Af Amer: 58 mL/min — ABNORMAL LOW (ref >90)
GFR calc non Af Amer: 50 mL/min — ABNORMAL LOW (ref >90)
GLUCOSE: 99 mg/dL (ref 70–99)
Potassium: 3.8 mmol/L (ref 3.5–5.1)
SODIUM: 136 mmol/L (ref 135–145)

## 2014-12-07 LAB — PREPARE RBC (CROSSMATCH)

## 2014-12-07 MED ORDER — HYDROMORPHONE HCL 1 MG/ML IJ SOLN
1.0000 mg | Freq: Once | INTRAMUSCULAR | Status: AC
Start: 2014-12-07 — End: 2014-12-07
  Administered 2014-12-07: 1 mg via INTRAVENOUS
  Filled 2014-12-07: qty 1

## 2014-12-07 MED ORDER — HYDROMORPHONE HCL 1 MG/ML IJ SOLN
1.0000 mg | Freq: Once | INTRAMUSCULAR | Status: AC
  Administered 2014-12-07: 1 mg via INTRAVENOUS
  Filled 2014-12-07: qty 1

## 2014-12-07 MED ORDER — SODIUM CHLORIDE 0.9 % IJ SOLN
3.0000 mL | Freq: Two times a day (BID) | INTRAMUSCULAR | Status: DC
  Administered 2014-12-08: 3 mL via INTRAVENOUS

## 2014-12-07 MED ORDER — SODIUM CHLORIDE 0.9 % IJ SOLN: 9.0000 mL | INTRAMUSCULAR | Status: DC | PRN

## 2014-12-07 MED ORDER — HYDROMORPHONE 0.3 MG/ML IV SOLN
INTRAVENOUS | Status: DC
  Administered 2014-12-07: 17:00:00 via INTRAVENOUS
  Administered 2014-12-08: 0.9 mg via INTRAVENOUS
  Administered 2014-12-09: 0.3 mg via INTRAVENOUS
  Administered 2014-12-09: 0 mg via INTRAVENOUS
  Administered 2014-12-09: 0.6 mg via INTRAVENOUS
  Filled 2014-12-07: qty 25

## 2014-12-07 MED ORDER — PREDNISONE 20 MG PO TABS
40.0000 mg | ORAL_TABLET | Freq: Every day | ORAL | Status: DC
  Administered 2014-12-07 – 2014-12-09 (×3): 40 mg via ORAL
  Filled 2014-12-07 (×4): qty 2

## 2014-12-07 MED ORDER — SODIUM CHLORIDE 0.9 % IV SOLN
INTRAVENOUS | Status: DC
  Administered 2014-12-07: 18:00:00 via INTRAVENOUS

## 2014-12-07 MED ORDER — OXYCODONE-ACETAMINOPHEN 5-325 MG PO TABS
2.0000 | ORAL_TABLET | Freq: Once | ORAL | Status: AC
  Administered 2014-12-07: 2 via ORAL
  Filled 2014-12-07: qty 2

## 2014-12-07 MED ORDER — SODIUM CHLORIDE 0.9 % IV SOLN: Freq: Once | INTRAVENOUS | Status: DC

## 2014-12-07 MED ORDER — VITAMIN D 1000 UNITS PO TABS
1000.0000 [IU] | ORAL_TABLET | Freq: Every day | ORAL | Status: DC
  Administered 2014-12-07 – 2014-12-09 (×3): 1000 [IU] via ORAL
  Filled 2014-12-07 (×3): qty 1

## 2014-12-07 MED ORDER — ONDANSETRON HCL 4 MG PO TABS: 4.0000 mg | ORAL_TABLET | Freq: Four times a day (QID) | ORAL | Status: DC | PRN

## 2014-12-07 MED ORDER — DIPHENHYDRAMINE HCL 50 MG/ML IJ SOLN: 12.5000 mg | Freq: Four times a day (QID) | INTRAMUSCULAR | Status: DC | PRN

## 2014-12-07 MED ORDER — ACETAMINOPHEN 325 MG PO TABS: 650.0000 mg | ORAL_TABLET | Freq: Four times a day (QID) | ORAL | Status: DC | PRN

## 2014-12-07 MED ORDER — ONDANSETRON HCL 4 MG/2ML IJ SOLN: 4.0000 mg | Freq: Four times a day (QID) | INTRAMUSCULAR | Status: DC | PRN

## 2014-12-07 MED ORDER — DIPHENHYDRAMINE HCL 12.5 MG/5ML PO ELIX
12.5000 mg | ORAL_SOLUTION | Freq: Four times a day (QID) | ORAL | Status: DC | PRN
  Filled 2014-12-07: qty 5

## 2014-12-07 MED ORDER — BENZONATATE 100 MG PO CAPS: 100.0000 mg | ORAL_CAPSULE | Freq: Three times a day (TID) | ORAL | Status: DC | PRN

## 2014-12-07 MED ORDER — ADULT MULTIVITAMIN W/MINERALS CH
1.0000 | ORAL_TABLET | Freq: Every day | ORAL | Status: DC
  Administered 2014-12-08 – 2014-12-09 (×2): 1 via ORAL
  Filled 2014-12-07 (×2): qty 1

## 2014-12-07 MED ORDER — SODIUM CHLORIDE 0.9 % IV BOLUS (SEPSIS)
1000.0000 mL | Freq: Once | INTRAVENOUS | Status: DC
Start: 2014-12-07 — End: 2014-12-09

## 2014-12-07 MED ORDER — OXYCODONE-ACETAMINOPHEN 5-325 MG PO TABS
2.0000 | ORAL_TABLET | Freq: Once | ORAL | Status: AC
  Administered 2014-12-07: 2 via ORAL

## 2014-12-07 MED ORDER — GUAIFENESIN 100 MG/5ML PO SOLN: 100.0000 mg | ORAL | Status: DC | PRN

## 2014-12-07 MED ORDER — HYDROMORPHONE HCL 1 MG/ML IJ SOLN: 1.0000 mg | Freq: Once | INTRAMUSCULAR | Status: DC

## 2014-12-07 MED ORDER — ACETAMINOPHEN 650 MG RE SUPP: 650.0000 mg | Freq: Four times a day (QID) | RECTAL | Status: DC | PRN

## 2014-12-07 MED ORDER — NALOXONE HCL 0.4 MG/ML IJ SOLN: 0.4000 mg | INTRAMUSCULAR | Status: DC | PRN

## 2014-12-07 MED ORDER — PNEUMOCOCCAL VAC POLYVALENT 25 MCG/0.5ML IJ INJ
0.5000 mL | INJECTION | INTRAMUSCULAR | Status: DC
  Filled 2014-12-07: qty 0.5

## 2014-12-07 MED ORDER — FOLIC ACID 1 MG PO TABS
1.0000 mg | ORAL_TABLET | Freq: Every day | ORAL | Status: DC
  Administered 2014-12-07 – 2014-12-09 (×3): 1 mg via ORAL
  Filled 2014-12-07 (×3): qty 1

## 2014-12-07 MED ORDER — INFLUENZA VAC SPLIT QUAD 0.5 ML IM SUSY
0.5000 mL | PREFILLED_SYRINGE | INTRAMUSCULAR | Status: DC
  Filled 2014-12-07: qty 0.5

## 2014-12-07 NOTE — ED Notes (Addendum)
Pt states he does not want to wait and will go to another hospital. Pt advised by staff to stay but still decided to leave.

## 2014-12-07 NOTE — Progress Notes (Signed)
Admission note:  Arrival Method:Stretcher from ED Mental Orientation:A&OX4 Telemetry: 6E01 CCMD notified Assessment: See doc flowsheet Skin: Dry, intact IV: R forearm; PCA connected & IV fluids running Pain: Denies Tubes: N/A Safety Measures: Bed in lowest position; call light within reach Admission Screening: To be completed 6700 Orientation: Patient has been oriented to the unit, staff and to the room.

## 2014-12-07 NOTE — ED Notes (Signed)
Admitting physician at bedside

## 2014-12-07 NOTE — ED Notes (Signed)
Awaiting blood from blood bank- not ready yet.

## 2014-12-07 NOTE — ED Notes (Signed)
Pt off unit with xray 

## 2014-12-07 NOTE — ED Notes (Signed)
Patient states he was woken up from sleep with a new Sickle Cell pain crisis in his back and the pain travels throughout his body, primarily down to his legs and feet. He took 4 tablets of  200mg  advil at 0030 and then took another 800 mg Prescription strength advil 0130, then took another 800 mg advil 30 minutes later, and one more 0400. Then went to [facility] and came here instead of waiting 4 to 5 hours. 3200 Mg Advil total in the past 6 hours.

## 2014-12-07 NOTE — H&P (Addendum)
[facility] History and Physical  Van Seymore NFA:[id number] DOB: [birth year] DOA: 12/07/2014  Referring physician: ED PCP:  Follows with Dr Harriette Bouillon at [facility]  Chief Complaint:  Sickle cell pain crisis   HPI:  39 year old male with history of CK D stage III-IV, sickle cell anemia with pain crisis about 3-4 episodes per year, last hospitalization in September 2015 presented to the ED with acute onset of low back pain and bilateral leg pains since last night. Patient reports taking occasional ibuprofen on normal days and some narcotics on as-needed basis.he took pain medications without much relief. Reports that symptoms are similar to his sickle cell pain crisis. He was seen in the ED showed his back for URI symptoms and given a course of prednisone taper. He reports the symptoms to have slightly improved but still has some chest congestion and cough with whitish phlegm. Patient reports some headache but denies dizziness, fever, chills, nausea , vomiting, chest pain, palpitations, SOB, abdominal pain, bowel or urinary symptoms. Denies change in weight or appetite.  Course in the ED Patient's vitals were stable. O2 sat dropped to 89% on room air. Blood will done showed a septal 0.3, RBC 1.51, and given a 5.8, hematocrit of 15.7 and platelets of 278. Chemistry showed sodium 136, K of 3.8, chloride 110, CO2 17, BUN of 27 creatinine 1.68. Patient was given IV Dilaudid 1 mg X3 followed by Percocet 4 tablets, 1 L IV normal saline, IV Benadryl following which his pain improved to only 9/10. hospitalist  admission requested for adequate pain control. Patient hemoglobin was 5.8 with baseline of around 7. On call sickle cell attending was called by ED physician who recommended transfusion with 1 unit PRBC.  Review of Systems:  Constitutional: Denies fever, chills, diaphoresis, appetite change and fatigue.  HEENT: congestion, sore throat, rhinorrhea,, Denies  eye pain, redness, hearing loss, ear  pain,  sneezing, mouth sores, trouble swallowing, neck pain, neck stiffness   Respiratory: cough,  Denies SOB, DOE,, chest tightness,  and wheezing.   Cardiovascular: Denies chest pain, palpitations and leg swelling.  Gastrointestinal: Denies nausea, vomiting, abdominal pain, diarrhea, constipation, blood in stool and abdominal distention.  Genitourinary: Denies dysuria,  hematuria, flank pain and difficulty urinating.  Endocrine: Denies: hot or cold intolerance, Musculoskeletal: Back pain, bilateral leg pains Denies myalgias, joint pain or swelling  Skin: Denies pallor, rash and wound.  Neurological: Reports headache, Denies dizziness, seizures, syncope, weakness, light-headedness, numbness  Hematological: Denies adenopathy.  Psychiatric/Behavioral: Denies confusion    Past Medical History  Diagnosis Date  . Sickle cell anemia   . Aneurysm     Pt states happened in 2011, Brain    Past Surgical History  Procedure Laterality Date  . Lymph node biopsy     Social History:  reports that he has never smoked. He has never used smokeless tobacco. He reports that he does not drink alcohol or use illicit drugs.  No Known Allergies  No family history on file.  Prior to Admission medications   Medication Sig Start Date End Date Taking? Authorizing Provider  benzonatate (TESSALON) 100 MG capsule Take 1 capsule (100 mg total) by mouth 3 (three) times daily as needed for cough. 12/01/14  Yes Fayrene Helper, PA-C  cholecalciferol (VITAMIN D) 1000 UNITS tablet Take 1,000 Units by mouth daily.   Yes Historical Provider, MD  folic acid (FOLVITE) 1 MG tablet Take 1 mg by mouth daily. Added to Med Rec 9/6 per patient update, MD aware   Yes Historical  Provider, MD  guaiFENesin (ROBITUSSIN) 100 MG/5ML liquid Take 5-10 mLs (100-200 mg total) by mouth every 4 (four) hours as needed for cough. 12/01/14  Yes Fayrene Helper, PA-C  Multiple Vitamins-Minerals (MULTIVITAMIN WITH MINERALS) tablet Take 1 tablet by mouth  daily.   Yes Historical Provider, MD  oxyCODONE-acetaminophen (PERCOCET) 5-325 MG per tablet Take 1 tablet by mouth every 4 (four) hours as needed for moderate pain.    Yes Historical Provider, MD  predniSONE (DELTASONE) 20 MG tablet 3 tabs po day one, then 2 tabs daily x 4 days 12/01/14  Yes Fayrene Helper, PA-C     Physical Exam:  Filed Vitals:   12/07/14 1400 12/07/14 1430 12/07/14 1432 12/07/14 1520  BP: 108/57 118/71 118/71 106/50  Pulse: 50 52 67 57  Temp:      TempSrc:      Resp: Height:      Weight:      SpO2: 91% 96% 97% 97%    Constitutional: Vital signs reviewed.  Middle aged male lying in bed in no acute distress HEENT: Pallor present, icteric, moist oral mucosa, no cervical lymphadenopathy Cardiovascular: RRR, S1 normal, S2 normal, no MRG Chest: CTAB, no wheezes, rales, or rhonchi Abdominal: Soft. Non-tender, non-distended, bowel sounds are normal,  Ext: Low back tenderness to pressure, tenderness on pressure to bilateral leg  Neurological: Alert and oriented, nonfocal  Labs on Admission:  Basic Metabolic Panel:  Recent Labs Lab 12/07/14 0728  NA 136  K 3.8  CL 110  CO2 17*  GLUCOSE 99  BUN 27*  CREATININE 1.68*  CALCIUM 8.8   Liver Function Tests: No results for input(s): AST, ALT, ALKPHOS, BILITOT, PROT, ALBUMIN in the last 168 hours. No results for input(s): LIPASE, AMYLASE in the last 168 hours. No results for input(s): AMMONIA in the last 168 hours. CBC:  Recent Labs Lab 12/07/14 0728  WBC 12.3*  NEUTROABS 9.7*  HGB 5.8*  HCT 15.7*  MCV 104.0*  PLT 278   Cardiac Enzymes: No results for input(s): CKTOTAL, CKMB, CKMBINDEX, TROPONINI in the last 168 hours. BNP: Invalid input(s): POCBNP CBG: No results for input(s): GLUCAP in the last 168 hours.  Radiological Exams on Admission: Dg Chest 2 View  12/07/2014   CLINICAL DATA:  Sickle cell pain.  Shortness of breath  EXAM: CHEST  2 VIEW  COMPARISON:  12/01/2014  FINDINGS: Moderate  cardiac enlargement noted. Increase prominence of pulmonary arteries suggesting PA hypertension. Chronic interstitial coarsening is noted bilaterally. No superimposed interstitial edema, airspace thick opacification or plural effusion. Osseous changes of sickle cell disease noted.  IMPRESSION: 1. No acute findings. 2. Cardiac enlargement and chronic interstitial coarsening.   Electronically Signed   By: Signa Kell M.D.   On: 12/07/2014 08:06   Dg Lumbar Spine Complete  12/07/2014   CLINICAL DATA:  Woke up from sleep with sickle cell pain.  EXAM: LUMBAR SPINE - COMPLETE 4+ VIEW  COMPARISON:  None.  FINDINGS: There is no evidence of lumbar spine fracture. Alignment is normal. Intervertebral disc spaces are maintained.  IMPRESSION: Negative.   Electronically Signed   By: Signa Kell M.D.   On: 12/07/2014 08:07      Assessment/Plan Principal Problem:   Sickle cell anemia with crisis Admit to telemetry. Likely triggered by recent viral bronchitis. Pain not controlled with IV Dilaudid and Percocet given in the ED. Will place him on Dilaudid PCA with titration as pain improves. -Continue folic acid and multivitamin. -1 unit PRBC  ordered as per sickle cell team recommendation. -Sickle cell team will resume care in the morning.  Active Problems:   CKD (chronic kidney disease) stage 3, GFR 30-59 ml/min Renal function appears improved than his baseline. Continue multivitamin and cholecalciferol. Follow up as outpt   Viral bronchitis Will continue oral prednisone 40 mg daily. Continue antitussives.    Diet: Regular  DVT prophylaxis: SCD   Code Status: Full code Family Communication: None at bedside Disposition Plan: Admit to inpatient telemetry  Eddie NorthDHUNGEL, Karin Pinedo [facility] Pager [phone]  Total time spent on admission :50 minutes  If 7PM-7AM, please contact night-coverage [email or web address] Password [email or web address] 12/07/2014, 3:37 PM

## 2014-12-07 NOTE — ED Notes (Signed)
Pt reports sickle cell pain starting around midnight. C/o pain in lower back.

## 2014-12-07 NOTE — ED Provider Notes (Signed)
CSN: [id number]     Arrival date & time 12/07/14  0612 History   First MD Initiated Contact with Patient 12/07/14 [phone]     Chief Complaint  Patient presents with  . Sickle Cell Pain Crisis     (Consider location/radiation/quality/duration/timing/severity/associated sxs/prior Treatment) HPI  Shawn Adams is a 39 y.o. male with PMH of sickle cell presenting with sickle cell pain crisis. Patient stated he woke up this morning and had pain in his back at times travels throughout his body including his legs and feet. He states the back pain is typical for him in the leg pain is new. He denies any new injury. He is taken ibuprofen without significant relief. He normally takes Percocet for his sickle cell pain but he ran out. He denies any chest pain, shortness of breath, fevers. He denies any numbness, tingling or weakness. Pt states he gets his percocet refill next week.  Hematologist: Trinna Balloon  Past Medical History  Diagnosis Date  . Sickle cell anemia   . Aneurysm     Pt states happened in 2011, Brain    Past Surgical History  Procedure Laterality Date  . Lymph node biopsy     No family history on file. History  Substance Use Topics  . Smoking status: Never Smoker   . Smokeless tobacco: Never Used  . Alcohol Use: No     Comment: occasionally    Review of Systems 10 Systems reviewed and are negative for acute change except as noted in the HPI.    Allergies  Review of patient's allergies indicates no known allergies.  Home Medications   Prior to Admission medications   Medication Sig Start Date End Date Taking? Authorizing Provider  benzonatate (TESSALON) 100 MG capsule Take 1 capsule (100 mg total) by mouth 3 (three) times daily as needed for cough. 12/01/14  Yes Fayrene Helper, PA-C  cholecalciferol (VITAMIN D) 1000 UNITS tablet Take 1,000 Units by mouth daily.   Yes Historical Provider, MD  folic acid (FOLVITE) 1 MG tablet Take 1 mg by mouth daily. Added to Med Rec 9/6  per patient update, MD aware   Yes Historical Provider, MD  guaiFENesin (ROBITUSSIN) 100 MG/5ML liquid Take 5-10 mLs (100-200 mg total) by mouth every 4 (four) hours as needed for cough. 12/01/14  Yes Fayrene Helper, PA-C  Multiple Vitamins-Minerals (MULTIVITAMIN WITH MINERALS) tablet Take 1 tablet by mouth daily.   Yes Historical Provider, MD  oxyCODONE-acetaminophen (PERCOCET) 5-325 MG per tablet Take 1 tablet by mouth every 4 (four) hours as needed for moderate pain.    Yes Historical Provider, MD  predniSONE (DELTASONE) 20 MG tablet 3 tabs po day one, then 2 tabs daily x 4 days 12/01/14  Yes Fayrene Helper, PA-C   BP 116/55 mmHg  Pulse 59  Temp(Src) 98 F (36.7 C) (Oral)  Resp 14  Ht 6' (1.829 m)  Wt 132 lb 11.5 oz (60.2 kg)  BMI 18.00 kg/m2  SpO2 98% Physical Exam  Constitutional: He appears well-developed and well-nourished. No distress.  HENT:  Head: Normocephalic and atraumatic.  Eyes: Conjunctivae are normal. Right eye exhibits no discharge. Left eye exhibits no discharge.  Cardiovascular: Normal rate, regular rhythm and normal heart sounds.   Pulmonary/Chest: Effort normal and breath sounds normal. No respiratory distress. He has no wheezes.  Abdominal: Soft. Bowel sounds are normal. He exhibits no distension. There is no tenderness.  Musculoskeletal:  Mild midline back tenderness. No step off or crepitus. Right and Left sided lower back  tenderness. No CVA tenderness. No TTP of upper and lower extremities, FROM.  Neurological: He is alert. Coordination normal.  Equal muscle tone. 5/5 strength in lower extremities. DTR equal and intact. Negative straight leg test. Normal gait.  Skin: Skin is warm and dry. He is not diaphoretic.  Nursing note and vitals reviewed.   ED Course  Procedures (including critical care time) Labs Review Labs Reviewed  CBC WITH DIFFERENTIAL/PLATELET - Abnormal; Notable for the following:    WBC 12.3 (*)    RBC 1.51 (*)    Hemoglobin 5.8 (*)    HCT 15.7 (*)     MCV 104.0 (*)    MCH 38.4 (*)    MCHC 36.9 (*)    RDW 22.6 (*)    Neutrophils Relative % 79 (*)    Neutro Abs 9.7 (*)    All other components within normal limits  BASIC METABOLIC PANEL - Abnormal; Notable for the following:    CO2 17 (*)    BUN 27 (*)    Creatinine, Ser 1.68 (*)    GFR calc non Af Amer 50 (*)    GFR calc Af Amer 58 (*)    All other components within normal limits  RETICULOCYTES - Abnormal; Notable for the following:    Retic Ct Pct 18.5 (*)    RBC. 1.51 (*)    Retic Count, Manual 279.4 (*)    All other components within normal limits  CBC WITH DIFFERENTIAL/PLATELET - Abnormal; Notable for the following:    RBC 1.65 (*)    Hemoglobin 6.0 (*)    HCT 16.2 (*)    MCH 36.4 (*)    MCHC 37.0 (*)    RDW 21.3 (*)    Neutrophils Relative % 93 (*)    Neutro Abs 9.8 (*)    Lymphocytes Relative 6 (*)    Lymphs Abs 0.6 (*)    Monocytes Relative 1 (*)    All other components within normal limits  TYPE AND SCREEN  PREPARE RBC (CROSSMATCH)    Imaging Review Dg Chest 2 View  12/07/2014   CLINICAL DATA:  Sickle cell pain.  Shortness of breath  EXAM: CHEST  2 VIEW  COMPARISON:  12/01/2014  FINDINGS: Moderate cardiac enlargement noted. Increase prominence of pulmonary arteries suggesting PA hypertension. Chronic interstitial coarsening is noted bilaterally. No superimposed interstitial edema, airspace thick opacification or plural effusion. Osseous changes of sickle cell disease noted.  IMPRESSION: 1. No acute findings. 2. Cardiac enlargement and chronic interstitial coarsening.   Electronically Signed   By: Signa Kell M.D.   On: 12/07/2014 08:06   Dg Lumbar Spine Complete  12/07/2014   CLINICAL DATA:  Woke up from sleep with sickle cell pain.  EXAM: LUMBAR SPINE - COMPLETE 4+ VIEW  COMPARISON:  None.  FINDINGS: There is no evidence of lumbar spine fracture. Alignment is normal. Intervertebral disc spaces are maintained.  IMPRESSION: Negative.   Electronically Signed    By: Signa Kell M.D.   On: 12/07/2014 08:07     EKG Interpretation None      MDM   Final diagnoses:  Low back pain  Sickle cell pain crisis   Pt with sickle cell presenting with typical sickle cell pain and has run out of home pain medications. No chest pain, SOB or fever. CXR and lumbar spine without acute findings. Pt given home percocet and 1L NS bolus without significant improvement of his pain. Dilaudid ordered.  Pt's HGB 5.8. Pt baseline 5-6 however  it is concerningly low. Reticulocyte count 18. Pt pain resolves with dilaudid but continues to return. Consult to hemotology then sickle cell medicine for recommendations for possible blood transfusion. Spoke with Dr. Nathanial MillmanAgboola who recommended transfusing one unit and pain control with home dose of pain medications and reassess. Type and screen pending.  Pt still without significant improvement of his pain. Transfusion ordered. Consult to hospitalist for admission. Spoke with Dr. Gonzella Lexhungel who agrees to evaluate the patient with plan for admission.  Discussed all results and patient verbalizes understanding and agrees with plan.    Oswaldo ConroyVictoria Joyell Emami, PA-C 12/08/14 1335  Derwood KaplanAnkit Nanavati, MD 12/11/14 1244

## 2014-12-08 DIAGNOSIS — D5700 Hb-SS disease with crisis, unspecified: Principal | ICD-10-CM

## 2014-12-08 LAB — CBC WITH DIFFERENTIAL/PLATELET
Basophils Absolute: 0 10*3/uL (ref 0.0–0.1)
Basophils Relative: 0 % (ref 0–1)
Eosinophils Absolute: 0 10*3/uL (ref 0.0–0.7)
Eosinophils Relative: 0 % (ref 0–5)
HEMATOCRIT: 16.2 % — AB (ref 39.0–52.0)
Hemoglobin: 6 g/dL — CL (ref 13.0–17.0)
Lymphocytes Relative: 6 % — ABNORMAL LOW (ref 12–46)
Lymphs Abs: 0.6 10*3/uL — ABNORMAL LOW (ref 0.7–4.0)
MCH: 36.4 pg — ABNORMAL HIGH (ref 26.0–34.0)
MCHC: 37 g/dL — ABNORMAL HIGH (ref 30.0–36.0)
MCV: 98.2 fL (ref 78.0–100.0)
Monocytes Absolute: 0.1 10*3/uL (ref 0.1–1.0)
Monocytes Relative: 1 % — ABNORMAL LOW (ref 3–12)
NEUTROS PCT: 93 % — AB (ref 43–77)
Neutro Abs: 9.8 10*3/uL — ABNORMAL HIGH (ref 1.7–7.7)
PLATELETS: 262 10*3/uL (ref 150–400)
RBC: 1.65 MIL/uL — ABNORMAL LOW (ref 4.22–5.81)
RDW: 21.3 % — ABNORMAL HIGH (ref 11.5–15.5)
WBC: 10.5 10*3/uL (ref 4.0–10.5)

## 2014-12-08 LAB — RETICULOCYTES
RBC.: 1.77 MIL/uL — ABNORMAL LOW (ref 4.22–5.81)
RETIC COUNT ABSOLUTE: 346.9 10*3/uL — AB (ref 19.0–186.0)
Retic Ct Pct: 19.6 % — ABNORMAL HIGH (ref 0.4–3.1)

## 2014-12-08 LAB — TYPE AND SCREEN
ABO/RH(D): B POS
Antibody Screen: NEGATIVE
DONOR AG TYPE: NEGATIVE
Unit division: 0

## 2014-12-08 LAB — COMPREHENSIVE METABOLIC PANEL
ALBUMIN: 3.6 g/dL (ref 3.5–5.2)
ALT: 23 U/L (ref 0–53)
ANION GAP: 4 — AB (ref 5–15)
AST: 32 U/L (ref 0–37)
Alkaline Phosphatase: 77 U/L (ref 39–117)
BUN: 22 mg/dL (ref 6–23)
CALCIUM: 8.5 mg/dL (ref 8.4–10.5)
CO2: 18 mmol/L — ABNORMAL LOW (ref 19–32)
CREATININE: 1.2 mg/dL (ref 0.50–1.35)
Chloride: 116 mmol/L — ABNORMAL HIGH (ref 96–112)
GFR calc Af Amer: 87 mL/min — ABNORMAL LOW (ref >90)
GFR calc non Af Amer: 75 mL/min — ABNORMAL LOW (ref >90)
Glucose, Bld: 123 mg/dL — ABNORMAL HIGH (ref 70–99)
Potassium: 4.3 mmol/L (ref 3.5–5.1)
SODIUM: 138 mmol/L (ref 135–145)
Total Bilirubin: 2.3 mg/dL — ABNORMAL HIGH (ref 0.3–1.2)
Total Protein: 6.5 g/dL (ref 6.0–8.3)

## 2014-12-08 LAB — LACTATE DEHYDROGENASE: LDH: 444 U/L — ABNORMAL HIGH (ref 94–250)

## 2014-12-08 MED ORDER — OXYCODONE-ACETAMINOPHEN 5-325 MG PO TABS: 1.0000 | ORAL_TABLET | ORAL | Status: DC | PRN

## 2014-12-08 MED ORDER — DEXTROSE-NACL 5-0.45 % IV SOLN
INTRAVENOUS | Status: DC
  Administered 2014-12-08 – 2014-12-09 (×2): via INTRAVENOUS

## 2014-12-08 MED ORDER — HYDROXYUREA 500 MG PO CAPS
1000.0000 mg | ORAL_CAPSULE | Freq: Every day | ORAL | Status: DC
  Administered 2014-12-08 – 2014-12-09 (×2): 1000 mg via ORAL
  Filled 2014-12-08 (×2): qty 2

## 2014-12-08 NOTE — Progress Notes (Signed)
Paged Dr. Gonzella Lexhungel, pt states he would like to go home.

## 2014-12-08 NOTE — Progress Notes (Signed)
[facility] along with dilaudid PCA in use.

## 2014-12-08 NOTE — Progress Notes (Signed)
Patient ID: Shawn Adams, male   DOB: [birth year], 39 y.o.   MRN: [id number] SICKLE CELL SERVICE Accept Note/PROGRESS NOTE  Shawn Adams LKG:[id number] DOB: [birth year] DOA: 12/07/2014 PCP: Jackie Plum, MD   Presenting HPI: 39 year old male with history of CK D stage III-IV, sickle cell anemia with pain crisis about 3-4 episodes per year, last hospitalization in September 2015 presented to the ED with acute onset of low back pain and bilateral leg pains since last night. Patient reports taking occasional ibuprofen on normal days and some narcotics on as-needed basis.he took pain medications without much relief. Reports that symptoms are similar to his sickle cell pain crisis. He was seen in the ED showed his back for URI symptoms and given a course of prednisone taper. He reports the symptoms to have slightly improved but still has some chest congestion and cough with whitish phlegm. Patient reports some headache but denies dizziness, fever, chills, nausea , vomiting, chest pain, palpitations, SOB, abdominal pain, bowel or urinary symptoms. Denies change in weight or appetite. Consultants:  None  Procedures:  None  Antibiotics:  None  HPI/Subjective: Assumed Care of pt at 5pm on 12/08/14. Transferring physician made aware of delayed transfer at 2pm.  Pt reports feeling better than when he presented. He stated his pain was initially everywhere but now mostly in back. He is using very little of his Dilaudid PCA, which he finds helpful. Pt requested to be sent home earlier today.  He had a PCP, Dr. Julio Sicks but has not followed up with him. He gets most of his care from Dr. Trinna Balloon. Pt states that he ran out of his Percocet but has called for new ones to be sent to his home.   He is eating and drinking. Had a bowel movement yesterday. He states his baseline Hgb is usually slightly under 6.   Objective: Filed Vitals:   12/08/14 0405 12/08/14 0500 12/08/14 0914 12/08/14 1717   BP:  116/65 116/55 125/62  Pulse:  59 59 58  Temp:  97.8 F (36.6 C) 98 F (36.7 C) 98.4 F (36.9 C)  TempSrc:  Oral Oral Oral  Resp: Height:      Weight:    133 lb 2.5 oz (60.4 kg)  SpO2: 95% 93% 98% 96%   Weight change: -7 lb 4.5 oz (-3.304 kg)  Intake/Output Summary (Last 24 hours) at 12/08/14 1727 Last data filed at 12/08/14 1343  Gross per 24 hour  Intake   2200 ml  Output   2050 ml  Net    150 ml    General: Alert, awake, oriented x3, in no acute distress.  HEENT: /AT PEERL, EOMI Neck: Trachea midline,  no masses, no thyromegaly no JVD, no carotid bruit OROPHARYNX:  Moist, No exudate/ erythema/lesions.  Heart: Regular rate and rhythm, without murmurs, rubs, gallops Lungs: Clear to auscultation, no wheezing or rhonchi noted.  Abdomen: Soft, nontender, concave but nondistended, positive bowel sounds, no masses no hepatosplenomegaly noted..  Neuro: No focal neurological deficits noted cranial nerves II through XII grossly intact.  Strength 5 out of 5 in bilateral upper and lower extremities. Musculoskeletal: No warmth, swelling or erythema around joints, no spinal tenderness noted. Psychiatric: Patient alert and oriented x3, good insight and cognition, good recent to remote recall.   Data Reviewed: Basic Metabolic Panel:  Recent Labs Lab 12/07/14 0728  NA 136  K 3.8  CL 110  CO2 17*  GLUCOSE 99  BUN 27*  CREATININE 1.68*  CALCIUM 8.8   Liver Function Tests: No results for input(s): AST, ALT, ALKPHOS, BILITOT, PROT, ALBUMIN in the last 168 hours. No results for input(s): LIPASE, AMYLASE in the last 168 hours. No results for input(s): AMMONIA in the last 168 hours. CBC:  Recent Labs Lab 12/07/14 0728 12/08/14 0601  WBC 12.3* 10.5  NEUTROABS 9.7* 9.8*  HGB 5.8* 6.0*  HCT 15.7* 16.2*  MCV 104.0* 98.2  PLT 278 262    Studies: Dg Chest 2 View  12/07/2014   CLINICAL DATA:  Sickle cell pain.  Shortness of breath  EXAM: CHEST  2 VIEW   COMPARISON:  12/01/2014  FINDINGS: Moderate cardiac enlargement noted. Increase prominence of pulmonary arteries suggesting PA hypertension. Chronic interstitial coarsening is noted bilaterally. No superimposed interstitial edema, airspace thick opacification or plural effusion. Osseous changes of sickle cell disease noted.  IMPRESSION: 1. No acute findings. 2. Cardiac enlargement and chronic interstitial coarsening.   Electronically Signed   By: Signa Kell M.D.   On: 12/07/2014 08:06   Dg Chest 2 View  12/01/2014   CLINICAL DATA:  Sickle cell disease. Cough and congestion. Short of breath.  EXAM: CHEST  2 VIEW  COMPARISON:  Chest radiograph 06/20/2014, CT thorax 09/04/2013  FINDINGS: There is bulky bilateral hilar and paratracheal lymphadenopathy not changed from comparison radiograph and well demonstrated on CT of 09/04/2013.  There is mild bronchitic markings centrally. No focal infiltrate. No pleural fluid.  IMPRESSION: 1. No evidence of acute pneumonia. 2. Chronic bronchitic in pulmonary markings of sickle cell disease. 3. Bulky bilateral hilar and mediastinal lymphadenopathy not changed over multiple comparison exams.   Electronically Signed   By: Genevive Bi M.D.   On: 12/01/2014 10:52   Dg Lumbar Spine Complete  12/07/2014   CLINICAL DATA:  Woke up from sleep with sickle cell pain.  EXAM: LUMBAR SPINE - COMPLETE 4+ VIEW  COMPARISON:  None.  FINDINGS: There is no evidence of lumbar spine fracture. Alignment is normal. Intervertebral disc spaces are maintained.  IMPRESSION: Negative.   Electronically Signed   By: Signa Kell M.D.   On: 12/07/2014 08:07      Scheduled Meds: . sodium chloride   Intravenous Once  . cholecalciferol  1,000 Units Oral Daily  . folic acid  1 mg Oral Daily  . HYDROmorphone PCA 0.3 mg/mL   Intravenous 6 times per day  . Influenza vac split quadrivalent PF  0.5 mL Intramuscular Tomorrow-1000  . multivitamin with minerals  1 tablet Oral Daily  . pneumococcal  23 valent vaccine  0.5 mL Intramuscular Tomorrow-1000  . predniSONE  40 mg Oral Q breakfast  . sodium chloride  1,000 mL Intravenous Once  . sodium chloride  3 mL Intravenous Q12H   Continuous Infusions: . sodium chloride 100 mL/hr at 12/07/14 1739    Principal Problem:   Sickle cell anemia with crisis Active Problems:   Sickle cell anemia   CKD (chronic kidney disease) stage 3, GFR 30-59 ml/min   Sickle cell pain crisis   Assessment/Plan: Principal Problem:   Sickle cell anemia with crisis Active Problems:   Sickle cell anemia   CKD (chronic kidney disease) stage 3, GFR 30-59 ml/min   Sickle cell pain crisis  1)Sickle Cell Crisis: Hgb SS with VOC. Pt's pain has improved, currently at a 3/10. Used 1.4mg  since transfer. Overall minimal use of PCA. Will continue PCA at current settings and start his home dose of Percocet  q4h PRN.  Pt encouraged to push PCA  only for breakthrough pain.  2) Anemia: He has anemia of chronic disease. Hgb up to 6.0 this morning after 1 unit of PRBC. Pt's last baseline Hgb was 6.1 on 11/20/14. Repeat this evening.   3)Sickle Cell Care: Continue Folic acid and restart hydroxyurea 1000mg  daily.   4)CKD stage III: Multifactorial. [facility] Has increased Creatinine up to 1.68 from his baseline of 1.5. Will continue hydration and repeat this evening. Hold NSAIDs for now. On prednisone due to sarcoidosis effect on kidney-will continue.  5) FEN/GI : Regular Diet  Switch IV fluids-to D5/0.45%  Bowel regimen in place  Code Status: Full  DVT Prophylaxis: SCD  Family Communication: none  Disposition Plan: If Hgb remains stable and creatinine is at baseline, he should be discharged in the morning.   Serina CowperAGBOOLA, Verlia Kaney  Pager [phone]. If 7PM-7AM, please contact night-coverage.  12/08/2014, 5:27 PM  LOS: 1 day   Serina CowperAGBOOLA, Juvenal Umar

## 2014-12-08 NOTE — Progress Notes (Signed)
UR Completed.  [phone]  

## 2014-12-08 NOTE — Progress Notes (Signed)
Dr. Gonz[facility] Lexhungel [facility]

## 2014-12-08 NOTE — Progress Notes (Signed)
Called report to [facility] [place] [facility], bed [phone]

## 2014-12-09 DIAGNOSIS — D869 Sarcoidosis, unspecified: Secondary | ICD-10-CM

## 2014-12-09 LAB — CBC WITH DIFFERENTIAL/PLATELET
BASOS ABS: 0 10*3/uL (ref 0.0–0.1)
BASOS PCT: 0 % (ref 0–1)
EOS ABS: 0 10*3/uL (ref 0.0–0.7)
Eosinophils Relative: 0 % (ref 0–5)
HCT: 17.8 % — ABNORMAL LOW (ref 39.0–52.0)
Hemoglobin: 6.6 g/dL — CL (ref 13.0–17.0)
LYMPHS PCT: 20 % (ref 12–46)
Lymphs Abs: 1.3 10*3/uL (ref 0.7–4.0)
MCH: 37.7 pg — ABNORMAL HIGH (ref 26.0–34.0)
MCHC: 37.1 g/dL — AB (ref 30.0–36.0)
MCV: 100.6 fL — AB (ref 78.0–100.0)
Monocytes Absolute: 0.6 10*3/uL (ref 0.1–1.0)
Monocytes Relative: 9 % (ref 3–12)
NEUTROS PCT: 71 % (ref 43–77)
Neutro Abs: 4.8 10*3/uL (ref 1.7–7.7)
PLATELETS: 269 10*3/uL (ref 150–400)
RBC: 1.77 MIL/uL — ABNORMAL LOW (ref 4.22–5.81)
RDW: 22.8 % — AB (ref 11.5–15.5)
WBC: 6.7 10*3/uL (ref 4.0–10.5)

## 2014-12-09 NOTE — Discharge Summary (Signed)
Lauri Till MRN: [id number] DOB/AGE: [birth year] 39 y.o.  Admit date: 12/07/2014 Discharge date: 12/09/2014  Primary Care Physician:  Jackie Plum, MD   Discharge Diagnoses:   Patient Active Problem List   Diagnosis Date Noted  . Sickle cell anemia with crisis 12/07/2014  . Sickle cell anemia 06/20/2014  . CKD (chronic kidney disease) stage 3, GFR 30-59 ml/min 06/20/2014  . Leukocytosis 06/20/2014  . Sickle cell pain crisis 06/20/2014  . Hypercalcemia 06/20/2014  . Symptomatic anemia 05/31/2014    DISCHARGE MEDICATION:   Medication List    STOP taking these medications        cholecalciferol 1000 UNITS tablet  Commonly known as:  VITAMIN D      TAKE these medications        benzonatate 100 MG capsule  Commonly known as:  TESSALON  Take 1 capsule (100 mg total) by mouth 3 (three) times daily as needed for cough.     famotidine 20 MG tablet  Commonly known as:  PEPCID  Take 20 mg by mouth 2 (two) times daily.     folic acid 1 MG tablet  Commonly known as:  FOLVITE  Take 1 mg by mouth daily. Added to Med Rec 9/6 per patient update, MD aware     guaiFENesin 100 MG/5ML liquid  Commonly known as:  ROBITUSSIN  Take 5-10 mLs (100-200 mg total) by mouth every 4 (four) hours as needed for cough.     hydroxyurea 500 MG capsule  Commonly known as:  HYDREA  Take 1,000 mg by mouth daily. May take with food to minimize GI side effects.     multivitamin with minerals tablet  Take 1 tablet by mouth daily.     oxyCODONE-acetaminophen 5-325 MG per tablet  Commonly known as:  PERCOCET/ROXICET  Take 1 tablet by mouth every 4 (four) hours as needed for moderate pain.     predniSONE 10 MG tablet  Commonly known as:  DELTASONE  Take 10 mg by mouth daily with breakfast.          Consults:     SIGNIFICANT DIAGNOSTIC STUDIES:  Dg Chest 2 View  12/07/2014   CLINICAL DATA:  Sickle cell pain.  Shortness of breath  EXAM: CHEST  2 VIEW  COMPARISON:  12/01/2014  FINDINGS:  Moderate cardiac enlargement noted. Increase prominence of pulmonary arteries suggesting PA hypertension. Chronic interstitial coarsening is noted bilaterally. No superimposed interstitial edema, airspace thick opacification or plural effusion. Osseous changes of sickle cell disease noted.  IMPRESSION: 1. No acute findings. 2. Cardiac enlargement and chronic interstitial coarsening.   Electronically Signed   By: Signa Kell M.D.   On: 12/07/2014 08:06   Dg Chest 2 View  12/01/2014   CLINICAL DATA:  Sickle cell disease. Cough and congestion. Short of breath.  EXAM: CHEST  2 VIEW  COMPARISON:  Chest radiograph 06/20/2014, CT thorax 09/04/2013  FINDINGS: There is bulky bilateral hilar and paratracheal lymphadenopathy not changed from comparison radiograph and well demonstrated on CT of 09/04/2013.  There is mild bronchitic markings centrally. No focal infiltrate. No pleural fluid.  IMPRESSION: 1. No evidence of acute pneumonia. 2. Chronic bronchitic in pulmonary markings of sickle cell disease. 3. Bulky bilateral hilar and mediastinal lymphadenopathy not changed over multiple comparison exams.   Electronically Signed   By: Genevive Bi M.D.   On: 12/01/2014 10:52   Dg Lumbar Spine Complete  12/07/2014   CLINICAL DATA:  Woke up from sleep with sickle cell pain.  EXAM: LUMBAR  SPINE - COMPLETE 4+ VIEW  COMPARISON:  None.  FINDINGS: There is no evidence of lumbar spine fracture. Alignment is normal. Intervertebral disc spaces are maintained.  IMPRESSION: Negative.   Electronically Signed   By: Signa Kell M.D.   On: 12/07/2014 08:07     No results found for this or any previous visit (from the past 240 hour(s)).  BRIEF ADMITTING H & P: 39 year old male with history of CK D stage III-IV, sickle cell anemia with pain crisis about 3-4 episodes per year, last hospitalization in September 2015 presented to the ED with acute onset of low back pain and bilateral leg pains since last night. Patient reports  taking occasional ibuprofen on normal days and some narcotics on as-needed basis.he took pain medications without much relief. Reports that symptoms are similar to his sickle cell pain crisis. He was seen in the ED showed his back for URI symptoms and given a course of prednisone taper. He reports the symptoms to have slightly improved but still has some chest congestion and cough with whitish phlegm. Patient reports some headache but denies dizziness, fever, chills, nausea , vomiting, chest pain, palpitations, SOB, abdominal pain, bowel or urinary symptoms. Denies change in weight or appetite.   Hospital Course:  Present on Admission:  . Sickle cell anemia with crisis: Kyley is a very pleasant gentleman who is followed at [facility] for his SCD as well as sarcoid disease. He was treated with IV Toradol and IV Dilaudid via PCA  As well as IVF. He was transitioned to oral analgesics as pain improved and at the time of discharged used < 5 mg of dilaudid in 12 hours. He rates his pain as 2/10. He is being discharged home on the usual percocet. He is to follow up with his Hematologist at [facility] in 1 week. He is currently on Hydrea and is to continue on 1000 mg daily as well as Folic Acid 1 mg. At the tim eof discharge his Hb was 6.6 which is at his baseline.  . CKD (chronic kidney disease) stage 3, GFR 30-59 ml/min: renal function was at baseline throughout hospital course. . Sarcoid Disease: HE is chronically on Prednisone 10 mg daily. He apparently had an increase in prednisone stemming from an ED visit. However there is no indication for increased doses at this time and I have resumed his chronic dose of 10 mg.    Disposition and Follow-up:  Pt is discharged home in good condition. He is to follow up with his Hematologist, Nephrologist and Pulmonologist as scheduled. He no longer follows with Dr. Julio Sicks and is in the process of trying to find a new PMD. He has the Ph# to our office as can follow  up with [place] if he chooses.    DISCHARGE EXAM:  General: Alert, awake, oriented x3, in no acute distress.  Vital Signs: BP 98/56, HR 67, T 98.2 F (36.8 C), temperature source Oral, RR 14, height 6' (1.829 m), weight 133 lb 2.5 oz (60.4 kg), SpO2 98 %. HEENT: [place]/AT PEERL, EOMI, mild icterus. Neck: Trachea midline, no masses, no thyromegal,y no JVD, no carotid bruit OROPHARYNX: Moist, No exudate/ erythema/lesions.  Heart: Regular rate and rhythm, without murmurs, rubs, gallops, PMI non-displaced.  Lungs: Clear to auscultation, no wheezing or rhonchi noted.  Abdomen: Soft, nontender, nondistended, positive bowel sounds, no masses no hepatosplenomegaly noted.  Neuro: No focal neurological deficits noted cranial nerves II through XII grossly intact. Strength at baseline in bilateral upper and lower  extremities. Musculoskeletal: No warm swelling or erythema around joints, no spinal tenderness noted. Psychiatric: Patient alert and oriented x3, good insight and cognition, good recent to remote recall. Lymph node survey: No cervical axillary or inguinal lymphadenopathy noted.    Recent Labs  12/07/14 0728 12/08/14 1900  NA 136 138  K 3.8 4.3  CL 110 116*  CO2 17* 18*  GLUCOSE 99 123*  BUN 27* 22  CREATININE 1.68* 1.20  CALCIUM 8.8 8.5    Recent Labs  12/08/14 1900  AST 32  ALT 23  ALKPHOS 77  BILITOT 2.3*  PROT 6.5  ALBUMIN 3.6   No results for input(s): LIPASE, AMYLASE in the last 72 hours.  Recent Labs  12/08/14 0601 12/08/14 1900  WBC 10.5 6.7  NEUTROABS 9.8* 4.8  HGB 6.0* 6.6*  HCT 16.2* 17.8*  MCV 98.2 100.6*  PLT 262 269   Total time spent including face to face and decision making was greater than 30 minutes.  Signed: MATTHEWS,MICHELLE A. 12/09/2014, 11:23 AM

## 2014-12-09 NOTE — Progress Notes (Signed)
Wasted 7 mg of hydromorphone PCA in the sink.  Witnessed by Debroah LoopKatie Dunkelberger, RN.

## 2014-12-23 ENCOUNTER — Telehealth: Payer: Self-pay | Admitting: Internal Medicine

## 2014-12-23 NOTE — Telephone Encounter (Signed)
Left message for patient to call to schedule appointment to establish care.  °

## 2015-01-29 ENCOUNTER — Ambulatory Visit (INDEPENDENT_AMBULATORY_CARE_PROVIDER_SITE_OTHER): Payer: Medicare Other | Admitting: Internal Medicine

## 2015-01-29 VITALS — BP 127/65 | HR 74 | Temp 98.3°F | Resp 16 | Ht 72.0 in | Wt 137.0 lb

## 2015-01-29 DIAGNOSIS — Z87898 Personal history of other specified conditions: Secondary | ICD-10-CM | POA: Diagnosis not present

## 2015-01-29 DIAGNOSIS — Z9189 Other specified personal risk factors, not elsewhere classified: Principal | ICD-10-CM

## 2015-01-29 DIAGNOSIS — Z0000 Encounter for general adult medical examination without abnormal findings: Secondary | ICD-10-CM | POA: Diagnosis not present

## 2015-01-29 DIAGNOSIS — D571 Sickle-cell disease without crisis: Secondary | ICD-10-CM

## 2015-01-29 DIAGNOSIS — E559 Vitamin D deficiency, unspecified: Secondary | ICD-10-CM | POA: Diagnosis not present

## 2015-01-29 DIAGNOSIS — M810 Age-related osteoporosis without current pathological fracture: Secondary | ICD-10-CM

## 2015-01-29 MED ORDER — VITAMIN D 50 MCG (2000 UT) PO CAPS: 2000.0000 [IU] | ORAL_CAPSULE | Freq: Every day | ORAL | Status: DC

## 2015-03-08 ENCOUNTER — Encounter (HOSPITAL_COMMUNITY): Payer: Self-pay

## 2015-03-08 ENCOUNTER — Inpatient Hospital Stay (HOSPITAL_COMMUNITY)
Admission: EM | Admit: 2015-03-08 | Discharge: 2015-03-09 | DRG: 812 | Disposition: A | Discharge status: Home / Self Care | Payer: Medicare Other | Attending: Internal Medicine | Admitting: Internal Medicine

## 2015-03-08 ENCOUNTER — Emergency Department (HOSPITAL_COMMUNITY): Payer: Medicare Other

## 2015-03-08 DIAGNOSIS — Z79899 Other long term (current) drug therapy: Secondary | ICD-10-CM | POA: Diagnosis not present

## 2015-03-08 DIAGNOSIS — D869 Sarcoidosis, unspecified: Secondary | ICD-10-CM | POA: Diagnosis present

## 2015-03-08 DIAGNOSIS — N183 Chronic kidney disease, stage 3 (moderate): Secondary | ICD-10-CM | POA: Diagnosis present

## 2015-03-08 DIAGNOSIS — Z7952 Long term (current) use of systemic steroids: Secondary | ICD-10-CM

## 2015-03-08 DIAGNOSIS — N179 Acute kidney failure, unspecified: Secondary | ICD-10-CM | POA: Diagnosis present

## 2015-03-08 DIAGNOSIS — M546 Pain in thoracic spine: Secondary | ICD-10-CM | POA: Diagnosis not present

## 2015-03-08 DIAGNOSIS — N182 Chronic kidney disease, stage 2 (mild): Secondary | ICD-10-CM | POA: Diagnosis present

## 2015-03-08 DIAGNOSIS — M545 Low back pain: Secondary | ICD-10-CM | POA: Diagnosis not present

## 2015-03-08 DIAGNOSIS — M549 Dorsalgia, unspecified: Secondary | ICD-10-CM

## 2015-03-08 DIAGNOSIS — Z87891 Personal history of nicotine dependence: Secondary | ICD-10-CM | POA: Diagnosis not present

## 2015-03-08 DIAGNOSIS — D5700 Hb-SS disease with crisis, unspecified: Secondary | ICD-10-CM | POA: Diagnosis not present

## 2015-03-08 DIAGNOSIS — D649 Anemia, unspecified: Secondary | ICD-10-CM

## 2015-03-08 DIAGNOSIS — R0602 Shortness of breath: Secondary | ICD-10-CM | POA: Diagnosis not present

## 2015-03-08 HISTORY — DX: Cardiac murmur, unspecified: R01.1

## 2015-03-08 LAB — CBC WITH DIFFERENTIAL/PLATELET
BASOS PCT: 1 % (ref 0–1)
Basophils Absolute: 0.1 10*3/uL (ref 0.0–0.1)
EOS ABS: 0.5 10*3/uL (ref 0.0–0.7)
Eosinophils Relative: 4 % (ref 0–5)
HCT: 13.6 % — ABNORMAL LOW (ref 39.0–52.0)
Hemoglobin: 5.4 g/dL — CL (ref 13.0–17.0)
Lymphocytes Relative: 28 % (ref 12–46)
Lymphs Abs: 3.2 10*3/uL (ref 0.7–4.0)
MCH: 33.8 pg (ref 26.0–34.0)
MCHC: 36.9 g/dL — AB (ref 30.0–36.0)
MCV: 85 fL (ref 78.0–100.0)
Monocytes Absolute: 0.9 10*3/uL (ref 0.1–1.0)
Monocytes Relative: 8 % (ref 3–12)
Neutro Abs: 6.7 10*3/uL (ref 1.7–7.7)
Neutrophils Relative %: 59 % (ref 43–77)
PLATELETS: 352 10*3/uL (ref 150–400)
RBC: 1.6 MIL/uL — AB (ref 4.22–5.81)
RDW: 22.6 % — AB (ref 11.5–15.5)
WBC: 11.4 10*3/uL — ABNORMAL HIGH (ref 4.0–10.5)

## 2015-03-08 LAB — COMPREHENSIVE METABOLIC PANEL
ALK PHOS: 117 U/L (ref 38–126)
ALT: 35 U/L (ref 17–63)
ANION GAP: 8 (ref 5–15)
AST: 80 U/L — ABNORMAL HIGH (ref 15–41)
Albumin: 3.9 g/dL (ref 3.5–5.0)
BUN: 22 mg/dL — ABNORMAL HIGH (ref 6–20)
CALCIUM: 8.6 mg/dL — AB (ref 8.9–10.3)
CO2: 18 mmol/L — ABNORMAL LOW (ref 22–32)
Chloride: 109 mmol/L (ref 101–111)
Creatinine, Ser: 1.52 mg/dL — ABNORMAL HIGH (ref 0.61–1.24)
GFR calc Af Amer: >60 mL/min (ref >60)
GFR, EST NON AFRICAN AMERICAN: 57 mL/min — AB (ref >60)
GLUCOSE: 97 mg/dL (ref 65–99)
Potassium: 3.6 mmol/L (ref 3.5–5.1)
SODIUM: 135 mmol/L (ref 135–145)
Total Bilirubin: 3.1 mg/dL — ABNORMAL HIGH (ref 0.3–1.2)
Total Protein: 7.1 g/dL (ref 6.5–8.1)

## 2015-03-08 LAB — RETICULOCYTES
RBC.: 1.6 MIL/uL — AB (ref 4.22–5.81)
Retic Ct Pct: >23 % — ABNORMAL HIGH (ref 0.4–3.1)

## 2015-03-08 MED ORDER — SODIUM CHLORIDE 0.9 % IV SOLN: Freq: Once | INTRAVENOUS | Status: AC

## 2015-03-08 MED ORDER — HYDROMORPHONE HCL 2 MG/ML IJ SOLN: 2.0000 mg | Freq: Once | INTRAMUSCULAR | Status: DC

## 2015-03-08 MED ORDER — HYDROMORPHONE HCL 1 MG/ML IJ SOLN
1.0000 mg | Freq: Once | INTRAMUSCULAR | Status: AC
  Administered 2015-03-08: 1 mg via INTRAVENOUS
  Filled 2015-03-08: qty 1

## 2015-03-08 MED ORDER — DIPHENHYDRAMINE HCL 25 MG PO CAPS
25.0000 mg | ORAL_CAPSULE | Freq: Once | ORAL | Status: AC
  Administered 2015-03-08: 25 mg via ORAL
  Filled 2015-03-08: qty 1

## 2015-03-08 MED ORDER — SODIUM CHLORIDE 0.9 % IV BOLUS (SEPSIS)
1000.0000 mL | Freq: Once | INTRAVENOUS | Status: AC
  Administered 2015-03-08: 1000 mL via INTRAVENOUS

## 2015-03-08 NOTE — ED Notes (Signed)
Pt complains of middle and lower back pain since yesterday

## 2015-03-08 NOTE — ED Provider Notes (Signed)
CSN: [id number]     Arrival date & time 03/08/15  2008 History   First MD Initiated Contact with Patient 03/08/15 2037     Chief Complaint  Patient presents with  . Sickle Cell Pain Crisis     (Consider location/radiation/quality/duration/timing/severity/associated sxs/prior Treatment) HPI Comments: The pt is a SS patient - he gets his care at [facility] - he has also recently been dx with sarcoid and has been taking chronic prednisone - he has had some lower back pain which has been intermittent today is sometimes in the lower, sometimes the middle and occasionally the upper back - had tylenol today with some improvement but at work it came back several times - continued with pain meds but it has not gone away - he is chronically SOB but has no fever or cough and no CP.  No swelling in the legs and no pain in the 4 extremities or the face.  No dyrusia, diarrhea, swellign, rashes, coughing, ha, sore throat or other c/o.  Patient is a 39 y.o. male presenting with sickle cell pain. The history is provided by the patient.  Sickle Cell Pain Crisis   Past Medical History  Diagnosis Date  . Sickle cell anemia   . Aneurysm     Pt states happened in 2011, Brain    Past Surgical History  Procedure Laterality Date  . Lymph node biopsy     History reviewed. No pertinent family history. History  Substance Use Topics  . Smoking status: Never Smoker   . Smokeless tobacco: Never Used  . Alcohol Use: No     Comment: occasionally    Review of Systems  All other systems reviewed and are negative.     Allergies  Review of patient's allergies indicates no known allergies.  Home Medications   Prior to Admission medications   Medication Sig Start Date End Date Taking? Authorizing Provider  famotidine (PEPCID) 20 MG tablet Take 20 mg by mouth daily.    Yes Historical Provider, MD  folic acid (FOLVITE) 1 MG tablet Take 1 mg by mouth daily.    Yes Historical Provider, MD  hydroxyurea (HYDREA) 500  MG capsule Take 1,000 mg by mouth daily. May take with food to minimize GI side effects.   Yes Historical Provider, MD  oxyCODONE-acetaminophen (PERCOCET) 5-325 MG per tablet Take 1 tablet by mouth every 4 (four) hours as needed for moderate pain.    Yes Historical Provider, MD  predniSONE (DELTASONE) 10 MG tablet Take 10 mg by mouth daily with breakfast.   Yes Historical Provider, MD  benzonatate (TESSALON) 100 MG capsule Take 1 capsule (100 mg total) by mouth 3 (three) times daily as needed for cough. Patient not taking: Reported on 01/29/2015 12/01/14   Fayrene Helper, PA-C  Cholecalciferol (VITAMIN D) 2000 UNITS CAPS Take 1 capsule (2,000 Units total) by mouth daily. Patient not taking: Reported on 03/08/2015 01/29/15   Altha Harm, MD  guaiFENesin (ROBITUSSIN) 100 MG/5ML liquid Take 5-10 mLs (100-200 mg total) by mouth every 4 (four) hours as needed for cough. Patient not taking: Reported on 01/29/2015 12/01/14   Fayrene Helper, PA-C   BP 133/78 mmHg  Pulse 86  Temp(Src) 97.9 F (36.6 C) (Oral)  Resp 18  SpO2 96% Physical Exam  Constitutional: He appears well-developed and well-nourished. No distress.  HENT:  Head: Normocephalic and atraumatic.  Mouth/Throat: Oropharynx is clear and moist. No oropharyngeal exudate.  Eyes: Conjunctivae and EOM are normal. Pupils are equal, round, and reactive  to light. Right eye exhibits no discharge. Left eye exhibits no discharge. No scleral icterus.  Neck: Normal range of motion. Neck supple. No JVD present. No thyromegaly present.  Cardiovascular: Normal rate, regular rhythm, normal heart sounds and intact distal pulses.  Exam reveals no gallop and no friction rub.   No murmur heard. Pulmonary/Chest: Effort normal and breath sounds normal. No respiratory distress. He has no wheezes. He has no rales.  Abdominal: Soft. Bowel sounds are normal. He exhibits no distension and no mass. There is no tenderness.  Musculoskeletal: Normal range of motion. He exhibits  tenderness ( mild ttp in the lower and mid back - no oupper back pain. ). He exhibits no edema.  Lymphadenopathy:    He has no cervical adenopathy.  Neurological: He is alert. Coordination normal.  Normal speech, coordination, and gait.  Skin: Skin is warm and dry. No rash noted. No erythema.  Psychiatric: He has a normal mood and affect. His behavior is normal.  Nursing note and vitals reviewed.   ED Course  Procedures (including critical care time) Labs Review Labs Reviewed  CBC WITH DIFFERENTIAL/PLATELET - Abnormal; Notable for the following:    RBC 1.60 (*)    Hemoglobin 5.4 (*)    HCT 13.6 (*)    RDW 22.6 (*)    All other components within normal limits  COMPREHENSIVE METABOLIC PANEL - Abnormal; Notable for the following:    CO2 18 (*)    BUN 22 (*)    Creatinine, Ser 1.52 (*)    Calcium 8.6 (*)    AST 80 (*)    Total Bilirubin 3.1 (*)    GFR calc non Af Amer 57 (*)    All other components within normal limits  RETICULOCYTES - Abnormal; Notable for the following:    Retic Ct Pct >23.0 (*)    RBC. 1.60 (*)    All other components within normal limits  PREPARE RBC (CROSSMATCH)    Imaging Review Dg Chest 2 View  03/08/2015   CLINICAL DATA:  Acute onset upper back pain. Sickle cell crisis. Shortness of breath. Initial encounter.  EXAM: CHEST  2 VIEW  COMPARISON:  Chest radiograph performed 12/07/2014, and CT of the chest performed 09/04/2013  FINDINGS: The lungs are well-aerated. Mild vascular congestion is seen. There is no evidence of focal opacification, pleural effusion or pneumothorax.  Known extensive bilateral hilar lymphadenopathy is again characterized. The mediastinum is otherwise grossly unremarkable. No acute osseous abnormalities are seen.  IMPRESSION: 1. Mild vascular congestion noted; lungs remain grossly clear. 2. Known extensive bilateral hilar lymphadenopathy again characterized. Would correlate as to whether this has previously been biopsied for diagnosis. As  noted on CT of the chest from 2014, this can be seen with sarcoidosis or other inflammatory condition. Malignancy is considered unlikely, given relative stability since 2014.   Electronically Signed   By: Roanna Raider M.D.   On: 03/08/2015 21:30     MDM   Final diagnoses:  Back pain  Severe anemia  Sickle cell crisis    Overall the patient has normal vital signs except for oxygenation, he states that he is feeling more short of breath recently, he has a reproducible back pain, it is unclear whether there is a distinct coordination between the low oxygen in the back pain so x-ray has been ordered, labs ordered, supple metal oxygen given, other vital signs are normal.  Pt with 6/10 pain after dialaudid, VS have normalized - he has severe anemia, apparently  his baseline is around 6.5, today is is 5.4.  Retic count is very high.   He has had fluids, pain meds, likely needs admission for transfusion, will consult with hospitalist.  D/w Dr. Joneen Roach of the hospitalist service for admission, she will come to see the pt in the ED.  I have personally seen, viewed and interpreted the images of the x-ray, I find her to be no focal infiltrates, he does have mild cardiomegaly and some perihilar infiltrates which is likely the lymph nodes from his sarcoidosis.  Meds given in ED:  Medications  0.9 %  sodium chloride infusion (not administered)  sodium chloride 0.9 % bolus 1,000 mL (0 mLs Intravenous Stopped 03/08/15 2256)  HYDROmorphone (DILAUDID) injection 1 mg (1 mg Intravenous Given 03/08/15 2122)  diphenhydrAMINE (BENADRYL) capsule 25 mg (25 mg Oral Given 03/08/15 2130)  HYDROmorphone (DILAUDID) injection 1 mg (1 mg Intravenous Given 03/08/15 2252)    New Prescriptions   No medications on file      Eber Hong, MD 03/08/15 2306

## 2015-03-08 NOTE — H&P (Signed)
PCP:   Odis Hollingshead, MD   Chief Complaint:  Pain   HPI: This is a 39 y/o male with h/o sickle cell who last night developed pain in his back. The pain moves occasionally to the legs. This is pain typical for his crisis. He states the pain at its worse was 8/10. His pain has been increasing he came to the ER. The patient uses percocet at home for pain control. He states he take 2 tablets three times daily. In the ER his Hgb was 5.4. He states this is typical he used to be on a protocol where he was transfused monthly, this has been discontinued for a time. There is no evidence of infection currently. The ER has requested admission. History provided by patient.   Review of Systems:  The patient denies anorexia, fever, weight loss,, vision loss, decreased hearing, hoarseness, chest pain, syncope, dyspnea on exertion, peripheral edema, balance deficits, hemoptysis, abdominal pain, melena, hematochezia, severe indigestion/heartburn, hematuria, incontinence, genital sores, muscle weakness, suspicious skin lesions, transient blindness, difficulty walking, depression, unusual weight change, abnormal bleeding, enlarged lymph nodes, angioedema, and breast masses.  Past Medical History: Past Medical History  Diagnosis Date  . Sickle cell anemia   . Aneurysm     Pt states happened in 2011, Brain   . Heart murmur    Past Surgical History  Procedure Laterality Date  . Lymph node biopsy      Medications: Prior to Admission medications   Medication Sig Start Date End Date Taking? Authorizing Provider  famotidine (PEPCID) 20 MG tablet Take 20 mg by mouth daily.    Yes Historical Provider, MD  folic acid (FOLVITE) 1 MG tablet Take 1 mg by mouth daily.    Yes Historical Provider, MD  hydroxyurea (HYDREA) 500 MG capsule Take 1,000 mg by mouth daily. May take with food to minimize GI side effects.   Yes Historical Provider, MD  oxyCODONE-acetaminophen (PERCOCET) 5-325 MG per tablet Take 1 tablet by  mouth every 4 (four) hours as needed for moderate pain.    Yes Historical Provider, MD  predniSONE (DELTASONE) 10 MG tablet Take 10 mg by mouth daily with breakfast.   Yes Historical Provider, MD  benzonatate (TESSALON) 100 MG capsule Take 1 capsule (100 mg total) by mouth 3 (three) times daily as needed for cough. Patient not taking: Reported on 01/29/2015 12/01/14   Fayrene Helper, PA-C  Cholecalciferol (VITAMIN D) 2000 UNITS CAPS Take 1 capsule (2,000 Units total) by mouth daily. Patient not taking: Reported on 03/08/2015 01/29/15   Altha Harm, MD  guaiFENesin (ROBITUSSIN) 100 MG/5ML liquid Take 5-10 mLs (100-200 mg total) by mouth every 4 (four) hours as needed for cough. Patient not taking: Reported on 01/29/2015 12/01/14   Fayrene Helper, PA-C    Allergies:  No Known Allergies  Social History:  reports that he has quit smoking. He has never used smokeless tobacco. He reports that he does not drink alcohol or use illicit drugs.  Family History: Family History  Problem Relation Age of Onset  . Diabetes Mother     Physical Exam: Filed Vitals:   03/08/15 2027 03/08/15 2126 03/08/15 2251 03/08/15 2318  BP: 133/75 128/76 133/78   Pulse: 85 84 86   Temp: 97.9 F (36.6 C)     TempSrc: Oral     Resp: 20 18 18    Height:    6\' 1"  (1.854 m)  Weight:    61.236 kg (135 lb)  SpO2: 88% 96% 96%  General:  Alert and oriented times three, well developed and nourished,patient in pain Eyes: PERRLA, pink conjunctiva, no scleral icterus ENT: Moist oral mucosa, neck supple, no thyromegaly Lungs: clear to ascultation, no wheeze, no crackles, no use of accessory muscles Cardiovascular: regular rate and rhythm, no regurgitation, no gallops, no murmurs. No carotid bruits, no JVD Abdomen: soft, positive BS, non-tender, non-distended, no organomegaly, not an acute abdomen GU: not examined Neuro: CN II - XII grossly intact, sensation intact Musculoskeletal: strength 5/5 all extremities, no clubbing,  cyanosis or edema Skin: no rash, no subcutaneous crepitation, no decubitus Psych: appropriate patient   Labs on Admission:   Recent Labs  03/08/15 2107  NA 135  K 3.6  CL 109  CO2 18*  GLUCOSE 97  BUN 22*  CREATININE 1.52*  CALCIUM 8.6*    Recent Labs  03/08/15 2107  AST 80*  ALT 35  ALKPHOS 117  BILITOT 3.1*  PROT 7.1  ALBUMIN 3.9   No results for input(s): LIPASE, AMYLASE in the last 72 hours.  Recent Labs  03/08/15 2107  WBC 11.4*  NEUTROABS 6.7  HGB 5.4*  HCT 13.6*  MCV 85.0  PLT 352   No results for input(s): CKTOTAL, CKMB, CKMBINDEX, TROPONINI in the last 72 hours. Invalid input(s): POCBNP No results for input(s): DDIMER in the last 72 hours. No results for input(s): HGBA1C in the last 72 hours. No results for input(s): CHOL, HDL, LDLCALC, TRIG, CHOLHDL, LDLDIRECT in the last 72 hours. No results for input(s): TSH, T4TOTAL, T3FREE, THYROIDAB in the last 72 hours.  Invalid input(s): FREET3  Recent Labs  03/08/15 2107  RETICCTPCT >23.0*    Micro Results: No results found for this or any previous visit (from the past 240 hour(s)).   Radiological Exams on Admission: Dg Chest 2 View  03/08/2015   CLINICAL DATA:  Acute onset upper back pain. Sickle cell crisis. Shortness of breath. Initial encounter.  EXAM: CHEST  2 VIEW  COMPARISON:  Chest radiograph performed 12/07/2014, and CT of the chest performed 09/04/2013  FINDINGS: The lungs are well-aerated. Mild vascular congestion is seen. There is no evidence of focal opacification, pleural effusion or pneumothorax.  Known extensive bilateral hilar lymphadenopathy is again characterized. The mediastinum is otherwise grossly unremarkable. No acute osseous abnormalities are seen.  IMPRESSION: 1. Mild vascular congestion noted; lungs remain grossly clear. 2. Known extensive bilateral hilar lymphadenopathy again characterized. Would correlate as to whether this has previously been biopsied for diagnosis. As  noted on CT of the chest from 2014, this can be seen with sarcoidosis or other inflammatory condition. Malignancy is considered unlikely, given relative stability since 2014.   Electronically Signed   By: Roanna Raider M.D.   On: 03/08/2015 21:30    Assessment/Plan Present on Admission:  . Sickle cell crisis Sickle cell anemia -admit to medsurg -IVF hydration -IV dilaudid 05,g IV every 3hrs as needed -transfuse 1 unit PRBC already ordered by ER physician -check LDH and ferritin. Retic count >23 -oxygen as needed -hydroxyurea continued . CKD (chronic kidney disease) stage 3, GFR 30-59 ml/min -  Angelique Chevalier 03/08/2015, 11:31 PM

## 2015-03-09 DIAGNOSIS — D5700 Hb-SS disease with crisis, unspecified: Principal | ICD-10-CM

## 2015-03-09 LAB — CBC
HCT: 17.6 % — ABNORMAL LOW (ref 39.0–52.0)
Hemoglobin: 6.5 g/dL — CL (ref 13.0–17.0)
MCH: 31.6 pg (ref 26.0–34.0)
MCHC: 36.9 g/dL — AB (ref 30.0–36.0)
MCV: 85.4 fL (ref 78.0–100.0)
Platelets: 293 10*3/uL (ref 150–400)
RBC: 2.06 MIL/uL — ABNORMAL LOW (ref 4.22–5.81)
RDW: 18.9 % — AB (ref 11.5–15.5)
WBC: 9.1 10*3/uL (ref 4.0–10.5)

## 2015-03-09 LAB — COMPREHENSIVE METABOLIC PANEL
ALT: 40 U/L (ref 17–63)
ANION GAP: 4 — AB (ref 5–15)
AST: 90 U/L — ABNORMAL HIGH (ref 15–41)
Albumin: 3.5 g/dL (ref 3.5–5.0)
Alkaline Phosphatase: 104 U/L (ref 38–126)
BUN: 18 mg/dL (ref 6–20)
CO2: 17 mmol/L — ABNORMAL LOW (ref 22–32)
Calcium: 8.3 mg/dL — ABNORMAL LOW (ref 8.9–10.3)
Chloride: 113 mmol/L — ABNORMAL HIGH (ref 101–111)
Creatinine, Ser: 1.29 mg/dL — ABNORMAL HIGH (ref 0.61–1.24)
GFR calc non Af Amer: >60 mL/min (ref >60)
GLUCOSE: 90 mg/dL (ref 65–99)
Potassium: 4.2 mmol/L (ref 3.5–5.1)
Sodium: 134 mmol/L — ABNORMAL LOW (ref 135–145)
TOTAL PROTEIN: 6.3 g/dL — AB (ref 6.5–8.1)
Total Bilirubin: 2.9 mg/dL — ABNORMAL HIGH (ref 0.3–1.2)

## 2015-03-09 LAB — PREPARE RBC (CROSSMATCH)

## 2015-03-09 MED ORDER — FOLIC ACID 1 MG PO TABS
1.0000 mg | ORAL_TABLET | Freq: Every day | ORAL | Status: DC
  Administered 2015-03-09: 1 mg via ORAL
  Filled 2015-03-09: qty 1

## 2015-03-09 MED ORDER — PREDNISONE 10 MG PO TABS
10.0000 mg | ORAL_TABLET | Freq: Every day | ORAL | Status: DC
  Administered 2015-03-09: 10 mg via ORAL
  Filled 2015-03-09 (×2): qty 1

## 2015-03-09 MED ORDER — HYDROMORPHONE HCL 1 MG/ML IJ SOLN
0.5000 mg | INTRAMUSCULAR | Status: DC | PRN
  Administered 2015-03-09 (×5): 0.5 mg via INTRAVENOUS
  Filled 2015-03-09 (×5): qty 1

## 2015-03-09 MED ORDER — ACETAMINOPHEN 650 MG RE SUPP: 650.0000 mg | Freq: Four times a day (QID) | RECTAL | Status: DC | PRN

## 2015-03-09 MED ORDER — FAMOTIDINE 20 MG PO TABS
20.0000 mg | ORAL_TABLET | Freq: Every day | ORAL | Status: DC
  Administered 2015-03-09: 20 mg via ORAL
  Filled 2015-03-09: qty 1

## 2015-03-09 MED ORDER — HEPARIN SODIUM (PORCINE) 5000 UNIT/ML IJ SOLN
5000.0000 [IU] | Freq: Three times a day (TID) | INTRAMUSCULAR | Status: DC
  Administered 2015-03-09: 5000 [IU] via SUBCUTANEOUS
  Filled 2015-03-09 (×5): qty 1

## 2015-03-09 MED ORDER — ACETAMINOPHEN 325 MG PO TABS: 650.0000 mg | ORAL_TABLET | Freq: Four times a day (QID) | ORAL | Status: DC | PRN

## 2015-03-09 MED ORDER — DIPHENHYDRAMINE HCL 25 MG PO CAPS: 25.0000 mg | ORAL_CAPSULE | Freq: Four times a day (QID) | ORAL | Status: DC | PRN

## 2015-03-09 MED ORDER — HYDROXYUREA 500 MG PO CAPS: 1000.0000 mg | ORAL_CAPSULE | Freq: Every day | ORAL | Status: DC

## 2015-03-09 MED ORDER — OXYCODONE-ACETAMINOPHEN 5-325 MG PO TABS
1.0000 | ORAL_TABLET | ORAL | Status: DC | PRN
Start: 2015-03-09 — End: 2015-03-09
  Administered 2015-03-09: 1 via ORAL
  Filled 2015-03-09: qty 1

## 2015-03-09 NOTE — Discharge Summary (Signed)
Physician Discharge Summary  Patient ID: Shawn Adams MRN: [id number] DOB/AGE: Jul 27, 1976 38 y.o.  Admit date: 03/08/2015 Discharge date: 03/09/2015  Admission Diagnoses:  Discharge Diagnoses:  Active Problems:   CKD (chronic kidney disease) stage 3, GFR 30-59 ml/min   Sickle cell crisis   Discharged Condition: good  Hospital Course: A 39 year old gentleman with known sickle cell disease who was admitted with sickle cell crisis. Patient was initially [facility] with pain at 7 out of 10. His hemoglobin was 5.4 so he was transfuse 1 unit of packed red blood cells and transferred over to [place] [facility]. At this point his pain level is down to 2 out of 10 and is feeling better after transfusion. He denied any shortness of breath or cough. We will therefore discharge him home to follow-up at the clinic sometime this week for blood work. If he still remains stable then we will not transfuse any further. Otherwise he has chronic kidney disease stage III which present as his blood transfusion for the most part rather than sickle cell disease.  Consults: None  Significant Diagnostic Studies: labs: CBC and CMP checked. Hemoglobin was 5.4 and was transfused 1 unit PRBC. Hb 6.5 at DC.  Treatments: IV hydration and analgesia: Dilaudid  Discharge Exam: Blood pressure 123/67, pulse 69, temperature 98.3 F (36.8 C), temperature source Oral, resp. rate 18, height 6' (1.829 m), weight 61 kg (134 lb 7.7 oz), SpO2 99 %. General appearance: alert, cooperative and no distress Head: Normocephalic, without obvious abnormality, atraumatic Eyes: conjunctivae/corneas clear. PERRL, EOM's intact. Fundi benign. Neck: no adenopathy, no carotid bruit, no JVD, supple, symmetrical, trachea midline and thyroid not enlarged, symmetric, no tenderness/mass/nodules Back: symmetric, no curvature. ROM normal. No CVA tenderness. Resp: clear to auscultation bilaterally Chest wall: no tenderness Cardio: regular rate and rhythm,  S1, S2 normal, no murmur, click, rub or gallop GI: soft, non-tender; bowel sounds normal; no masses,  no organomegaly Extremities: extremities normal, atraumatic, no cyanosis or edema Pulses: 2+ and symmetric  Disposition: 01-Home or Self Care     Medication List    TAKE these medications        benzonatate 100 MG capsule  Commonly known as:  TESSALON  Take 1 capsule (100 mg total) by mouth 3 (three) times daily as needed for cough.     famotidine 20 MG tablet  Commonly known as:  PEPCID  Take 20 mg by mouth daily.     folic acid 1 MG tablet  Commonly known as:  FOLVITE  Take 1 mg by mouth daily.     guaiFENesin 100 MG/5ML liquid  Commonly known as:  ROBITUSSIN  Take 5-10 mLs (100-200 mg total) by mouth every 4 (four) hours as needed for cough.     hydroxyurea 500 MG capsule  Commonly known as:  HYDREA  Take 1,000 mg by mouth daily. May take with food to minimize GI side effects.     oxyCODONE-acetaminophen 5-325 MG per tablet  Commonly known as:  PERCOCET/ROXICET  Take 1 tablet by mouth every 4 (four) hours as needed for moderate pain.     predniSONE 10 MG tablet  Commonly known as:  DELTASONE  Take 10 mg by mouth daily with breakfast.     Vitamin D 2000 UNITS Caps  Take 1 capsule (2,000 Units total) by mouth daily.         SignedLonia Blood 03/09/2015, 4:37 PM  Time spent 33 minutes

## 2015-03-09 NOTE — Progress Notes (Addendum)
Discharge instructions explained to pt. Discharged to home. Refused wheelchair at discharged.

## 2015-03-09 NOTE — Plan of Care (Signed)
Problem: Discharge Progression Outcomes Goal: Discharge plan in place and appropriate Outcome: Completed/Met Date Met:  03/09/15 Discharge to home

## 2015-03-09 NOTE — Progress Notes (Signed)
MEDICATION RELATED CONSULT NOTE - INITIAL   Pharmacy Consult for hydroxyurea Indication: Sickle cell anemia  No Known Allergies  Patient Measurements: Height: 6' (182.9 cm) (pt told me his hight was 6 even) Weight: 134 lb 7.7 oz (61 kg) IBW/kg (Calculated) : 77.6 Adjusted Body Weight:   Vital Signs: Temp: 97.8 F (36.6 C) (06/13 0017) Temp Source: Oral (06/13 0017) BP: 134/79 mmHg (06/13 0017) Pulse Rate: 74 (06/13 0017) Intake/Output from previous day: 06/12 0701 - 06/13 0700 In: 1000 [I.V.:1000] Out: -  Intake/Output from this shift: Total I/O In: 1000 [I.V.:1000] Out: -   Labs:  Recent Labs  03/08/15 2107  WBC 11.4*  HGB 5.4*  HCT 13.6*  PLT 352  CREATININE 1.52*  ALBUMIN 3.9  PROT 7.1  AST 80*  ALT 35  ALKPHOS 117  BILITOT 3.1*   Estimated Creatinine Clearance: 56.9 mL/min (by C-G formula based on Cr of 1.52).   Microbiology: No results found for this or any previous visit (from the past 720 hour(s)).  Medical History: Past Medical History  Diagnosis Date  . Sickle cell anemia   . Aneurysm     Pt states happened in 2011, Brain   . Heart murmur     Medications:  Prescriptions prior to admission  Medication Sig Dispense Refill Last Dose  . famotidine (PEPCID) 20 MG tablet Take 20 mg by mouth daily.    03/07/2015 at Unknown time  . folic acid (FOLVITE) 1 MG tablet Take 1 mg by mouth daily.    03/07/2015 at Unknown time  . hydroxyurea (HYDREA) 500 MG capsule Take 1,000 mg by mouth daily. May take with food to minimize GI side effects.   03/07/2015 at Unknown time  . oxyCODONE-acetaminophen (PERCOCET) 5-325 MG per tablet Take 1 tablet by mouth every 4 (four) hours as needed for moderate pain.    03/07/2015 at Unknown time  . predniSONE (DELTASONE) 10 MG tablet Take 10 mg by mouth daily with breakfast.   03/07/2015 at Unknown time  . benzonatate (TESSALON) 100 MG capsule Take 1 capsule (100 mg total) by mouth 3 (three) times daily as needed for cough.  (Patient not taking: Reported on 01/29/2015) 21 capsule 0 Not Taking  . Cholecalciferol (VITAMIN D) 2000 UNITS CAPS Take 1 capsule (2,000 Units total) by mouth daily. (Patient not taking: Reported on 03/08/2015) 30 capsule 11 Not Taking at Unknown time  . guaiFENesin (ROBITUSSIN) 100 MG/5ML liquid Take 5-10 mLs (100-200 mg total) by mouth every 4 (four) hours as needed for cough. (Patient not taking: Reported on 01/29/2015) 60 mL 0 Not Taking   Scheduled:  . famotidine  20 mg Oral Daily  . folic acid  1 mg Oral Daily  . heparin  5,000 Units Subcutaneous 3 times per day  . hydroxyurea  1,000 mg Oral Daily  . predniSONE  10 mg Oral Q breakfast   Hydroxyurea (Hydrea) hold criteria:  ANC < 2  Pltc < 80K in sickle-cell patients; < 100K in other patients  Hgb <= 6 in sickle-cell patients; < 8 in other patients  Reticulocytes < 80K when Hgb < 9    Assessment: Patient's Hgb is too low at this time for hydroxyurea.  Goal of Therapy:  Safe usage of hydroxyurea  Plan:  Will d/c order for hydroxyurea at this time  Follow up with labs  Darlina Guys, Jacquenette Shone Crowford 03/09/2015,1:11 AM

## 2015-03-10 LAB — TYPE AND SCREEN
ABO/RH(D): B POS
ANTIBODY SCREEN: NEGATIVE
DONOR AG TYPE: NEGATIVE
Donor AG Type: NEGATIVE
UNIT DIVISION: 0
Unit division: 0

## 2015-03-23 NOTE — Progress Notes (Signed)
Patient ID: Shawn Adams Speece, male   DOB: 08-20-1976, 39 y.o.   MRN: [id number]   Shawn Adams Shawn Adams, is a 39 y.o. male  [id number]  [id number]  DOB - 08-20-1976  CC:  Chief Complaint  Patient presents with  . Establish Care    pain in neck times 2 weeks        HPI: Shawn Adams Shawn Adams is a 39 y.o. male here today to establish medical care. 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 He also has a h/o pulmonary Sarcoid and is under [facility]. Currently he is on Prednisone 10 mg daily. He also has a h/o CKD which per review of records is felt to be multifactorial owing to Hb SS and Sarcoid. [facility] by Dr. Jean RosenthalPirkle.  A previous ECHO from 04/21/2014 showed mild L-V dilation with normal LVF, moderate LA dilation and mild RA dilation. He has normal RVF and size.   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 He has had headaches which per patient have been attributed to sinusitis. However he does not have a headache.  Pt does not know his baseline Hb/Hct, WBC or reticulocyte count. His Hydrea [facility]. He has a pending appointment wit them.   Today he has no c/o and he denies SOB or DOE.  Patient has No headache, No chest pain, No abdominal pain - No Nausea, No new weakness tingling or numbness, No Cough - SOB.  No Known Allergies Past Medical History  Diagnosis Date  . Sickle cell anemia   . Aneurysm     Pt states happened in 2011, Brain   . Heart murmur    Current Outpatient Prescriptions on File Prior to Visit  Medication Sig Dispense Refill  . famotidine (PEPCID) 20 MG tablet Take 20 mg by mouth daily.     . folic acid (FOLVITE) 1 MG tablet Take 1 mg by mouth daily.     . hydroxyurea (HYDREA) 500 MG capsule Take 1,000 mg by mouth daily. May take with food to minimize GI side effects.    Marland Kitchen. oxyCODONE-acetaminophen  (PERCOCET) 5-325 MG per tablet Take 1 tablet by mouth every 4 (four) hours as needed for moderate pain.     . predniSONE (DELTASONE) 10 MG tablet Take 10 mg by mouth daily with breakfast.    . benzonatate (TESSALON) 100 MG capsule Take 1 capsule (100 mg total) by mouth 3 (three) times daily as needed for cough. (Patient not taking: Reported on 01/29/2015) 21 capsule 0  . guaiFENesin (ROBITUSSIN) 100 MG/5ML liquid Take 5-10 mLs (100-200 mg total) by mouth every 4 (four) hours as needed for cough. (Patient not taking: Reported on 01/29/2015) 60 mL 0   No current facility-administered medications on file prior to visit.   Family History  Problem Relation Age of Onset  . Diabetes Mother    History   Social History  . Marital Status: Single    Spouse Name: N/A  . Number of Children: N/A  . Years of Education: N/A   Occupational History  . Not on file.   Social History Main Topics  . Smoking status: Former Games developermoker  . Smokeless tobacco: Never Used  . Alcohol Use: No     Comment: occasionally  . Drug Use: No     Comment: Pt states uses 2 times a  day  . Sexual Activity: Yes    Birth Control/ Protection: None   Other Topics Concern  . Not on file   Social History Narrative    Review of Systems: Constitutional: Negative for fever, chills, diaphoresis, activity change, appetite change and fatigue. HENT: Negative for ear pain, nosebleeds, congestion, facial swelling, rhinorrhea, neck pain, neck stiffness and ear discharge.  Eyes: Negative for pain, discharge, redness, itching and visual disturbance. Respiratory: Negative for cough, choking, chest tightness, shortness of breath, wheezing and stridor.  Cardiovascular: Negative for chest pain, palpitations and leg swelling. Gastrointestinal: Negative for abdominal distention. Genitourinary: Negative for dysuria, urgency, frequency, hematuria, flank pain, decreased urine volume, difficulty urinating and dyspareunia.  Musculoskeletal: Negative  for back pain, joint swelling, arthralgia and gait problem. Neurological: Negative for dizziness, tremors, seizures, syncope, facial asymmetry, speech difficulty, weakness, light-headedness, numbness and headaches.  Hematological: Negative for adenopathy. Does not bruise/bleed easily. Psychiatric/Behavioral: Negative for hallucinations, behavioral problems, confusion, dysphoric mood, decreased concentration and agitation.     Objective:   Filed Vitals:   01/29/15 1154  BP: 127/65  Pulse: 74  Temp: 98.3 F (36.8 C)  Resp: 16    Physical Exam: Constitutional: Patient appears well-developed and well-nourished. No distress. HENT: Normocephalic, atraumatic, External right and left ear normal. Oropharynx is clear and moist.  Eyes: Conjunctivae and EOM are normal. PERRLA, no scleral icterus. Neck: Normal ROM. Neck supple. No JVD. No tracheal deviation. No thyromegaly. CVS: RRR, S1/S2 +, no murmurs, no gallops, no carotid bruit.  Pulmonary: Effort and breath sounds normal, no stridor, rhonchi, wheezes, rales.  Abdominal: Soft. BS +, no distension, tenderness, rebound or guarding.  Musculoskeletal: Normal range of motion. No edema and no tenderness.  Lymphadenopathy: No lymphadenopathy noted, cervical, inguinal or axillary Neuro: Alert. Normal reflexes, muscle tone coordination. No cranial nerve deficit. Skin: Skin is warm and dry. No rash noted. Not diaphoretic. No erythema. No pallor. Psychiatric: Normal mood and affect. Behavior, judgment, thought content normal.   Lab Results  Component Value Date   WBC 9.1 03/09/2015   HGB 6.5* 03/09/2015   HCT 17.6* 03/09/2015   MCV 85.4 03/09/2015   PLT 293 03/09/2015   Lab Results  Component Value Date   CREATININE 1.29* 03/09/2015   BUN 18 03/09/2015   NA 134* 03/09/2015   K 4.2 03/09/2015   CL 113* 03/09/2015   CO2 17* 03/09/2015    No results found for: HGBA1C Lipid Panel  No results found for: CHOL, TRIG, HDL, CHOLHDL, VLDL,  LDLCALC     Assessment and plan:   1. At risk of fracture due to osteoporosis - Pt has been prescribed Vitamin D but is currently not taking it per his preference. I have explained risk and benefit particularly in the context of CKD and HB SS. H will consider it.  2. Preventative health care Pt UTD on immunizations including TDaP.  3. Hb-SS disease without crisis - Followed at [facility]  4. Vitamin D deficiency - Cholecalciferol (VITAMIN D) 2000 UNITS CAPS; Take 1 capsule (2,000 Units total) by mouth daily. (Patient not taking: Reported on 03/08/2015)  Dispense: 30 capsule; Refill: 11   Return in about 6 months (around 08/01/2015).  The patient was given clear instructions to go to ER or return to medical center if symptoms don't improve, worsen or new problems develop. The patient verbalized understanding. The patient was told to call to get lab results if they haven't heard anything in the next week.     This note has  been created with Education officer, environmental. Any transcriptional errors are unintentional.    Rylynne Schicker A., MD [facility] [place], [place] [phone]   03/23/2015, 2:41 PM

## 2015-04-06 ENCOUNTER — Telehealth: Payer: Self-pay | Admitting: Family Medicine

## 2015-04-06 DIAGNOSIS — D571 Sickle-cell disease without crisis: Secondary | ICD-10-CM | POA: Diagnosis not present

## 2015-04-06 NOTE — Telephone Encounter (Signed)
Received medical records request from [employer]. Forwarded request to [employer]. ° °

## 2015-07-06 ENCOUNTER — Emergency Department (HOSPITAL_COMMUNITY)
Admission: EM | Admit: 2015-07-06 | Discharge: 2015-07-06 | Disposition: A | Discharge status: Home / Self Care | Payer: Medicare Other | Attending: Emergency Medicine | Admitting: Emergency Medicine

## 2015-07-06 ENCOUNTER — Telehealth (HOSPITAL_COMMUNITY): Payer: Self-pay

## 2015-07-06 ENCOUNTER — Encounter (HOSPITAL_COMMUNITY): Payer: Self-pay | Admitting: Emergency Medicine

## 2015-07-06 ENCOUNTER — Emergency Department (HOSPITAL_COMMUNITY): Payer: Medicare Other

## 2015-07-06 DIAGNOSIS — M25571 Pain in right ankle and joints of right foot: Secondary | ICD-10-CM | POA: Diagnosis not present

## 2015-07-06 DIAGNOSIS — M25572 Pain in left ankle and joints of left foot: Secondary | ICD-10-CM | POA: Diagnosis not present

## 2015-07-06 DIAGNOSIS — Z87891 Personal history of nicotine dependence: Secondary | ICD-10-CM | POA: Diagnosis not present

## 2015-07-06 DIAGNOSIS — R011 Cardiac murmur, unspecified: Secondary | ICD-10-CM | POA: Insufficient documentation

## 2015-07-06 DIAGNOSIS — M79605 Pain in left leg: Secondary | ICD-10-CM | POA: Insufficient documentation

## 2015-07-06 DIAGNOSIS — Z79899 Other long term (current) drug therapy: Secondary | ICD-10-CM | POA: Diagnosis not present

## 2015-07-06 DIAGNOSIS — R0602 Shortness of breath: Secondary | ICD-10-CM | POA: Diagnosis not present

## 2015-07-06 DIAGNOSIS — Z7952 Long term (current) use of systemic steroids: Secondary | ICD-10-CM | POA: Insufficient documentation

## 2015-07-06 DIAGNOSIS — Z8679 Personal history of other diseases of the circulatory system: Secondary | ICD-10-CM | POA: Diagnosis not present

## 2015-07-06 MED ORDER — IBUPROFEN 800 MG PO TABS: 800.0000 mg | ORAL_TABLET | Freq: Three times a day (TID) | ORAL | Status: DC

## 2015-07-06 MED ORDER — ACETAMINOPHEN 325 MG PO TABS: 650.0000 mg | ORAL_TABLET | Freq: Once | ORAL | Status: DC

## 2015-07-06 MED ORDER — IBUPROFEN 800 MG PO TABS
800.0000 mg | ORAL_TABLET | Freq: Once | ORAL | Status: AC
  Administered 2015-07-06: 800 mg via ORAL
  Filled 2015-07-06: qty 1

## 2015-07-06 NOTE — Telephone Encounter (Signed)
After speaking with NP Hart Rochester, returned patient phone call. Patient instructed to report to the ED. Patient verbalizes understanding.

## 2015-07-06 NOTE — Discharge Instructions (Signed)
1. Medications: ibuprofen, usual home medications 2. Treatment: rest, drink plenty of fluids  3. Follow Up: please followup with your primary doctor within the next week for discussion of your diagnoses and further evaluation after today's visit; please return to the ER for severe pain or swelling, numbness, color change, new or worsening symptoms   Musculoskeletal Pain Musculoskeletal pain is muscle and boney aches and pains. These pains can occur in any part of the body. Your caregiver may treat you without knowing the cause of the pain. They may treat you if blood or urine tests, X-rays, and other tests were normal.  CAUSES There is often not a definite cause or reason for these pains. These pains may be caused by a type of germ (virus). The discomfort may also come from overuse. Overuse includes working out too hard when your body is not fit. Boney aches also come from weather changes. Bone is sensitive to atmospheric pressure changes. HOME CARE INSTRUCTIONS   Ask when your test results will be ready. Make sure you get your test results.  Only take over-the-counter or prescription medicines for pain, discomfort, or fever as directed by your caregiver. If you were given medications for your condition, do not drive, operate machinery or power tools, or sign legal documents for 24 hours. Do not drink alcohol. Do not take sleeping pills or other medications that may interfere with treatment.  Continue all activities unless the activities cause more pain. When the pain lessens, slowly resume normal activities. Gradually increase the intensity and duration of the activities or exercise.  During periods of severe pain, bed rest may be helpful. Lay or sit in any position that is comfortable.  Putting ice on the injured area.  Put ice in a bag.  Place a towel between your skin and the bag.  Leave the ice on for 15 to 20 minutes, 3 to 4 times a day.  Follow up with your caregiver for continued  problems and no reason can be found for the pain. If the pain becomes worse or does not go away, it may be necessary to repeat tests or do additional testing. Your caregiver may need to look further for a possible cause. SEEK IMMEDIATE MEDICAL CARE IF:  You have pain that is getting worse and is not relieved by medications.  You develop chest pain that is associated with shortness or breath, sweating, feeling sick to your stomach (nauseous), or throw up (vomit).  Your pain becomes localized to the abdomen.  You develop any new symptoms that seem different or that concern you. MAKE SURE YOU:   Understand these instructions.  Will watch your condition.  Will get help right away if you are not doing well or get worse.   This information is not intended to replace advice given to you by your health care provider. Make sure you discuss any questions you have with your health care provider.   Document Released: 09/12/2005 Document Revised: 12/05/2011 Document Reviewed: 05/17/2013 Elsevier Interactive Patient Education 2016 [employer].   Emergency Department Resource Guide 1) Find a Doctor and Pay Out of Pocket Although you won't have to find out who is covered by your insurance plan, it is a good idea to ask around and get recommendations. You will then need to call the office and see if the doctor you have chosen will accept you as a new patient and what types of options they offer for patients who are self-pay. Some doctors offer discounts or will  set up payment plans for their patients who do not have insurance, but you will need to ask so you aren't surprised when you get to your appointment.  2) Contact Your Local Health Department Not all health departments have doctors that can see patients for sick visits, but many do, so it is worth a call to see if yours does. If you don't know where your local health department is, you can check in your phone book. The CDC also has a tool to help  you locate your state's health department, and many state websites also have listings of all of their local health departments.  3) Find a Walk-in Clinic If your illness is not likely to be very severe or complicated, you may want to try a walk in clinic. These are popping up all over the country in pharmacies, drugstores, and shopping centers. They're usually staffed by nurse practitioners or physician assistants that have been trained to treat common illnesses and complaints. They're usually fairly quick and inexpensive. However, if you have serious medical issues or chronic medical problems, these are probably not your best option.  No Primary Care Doctor: - Call Health Connect at  [phone] - they can help you locate a primary care doctor that  accepts your insurance, provides certain services, etc. - Physician Referral Service- [phone]  Chronic Pain Problems: Organization         Address  Phone   Notes  [facility]  [phone] Patients need to be referred by their primary care doctor.   Medication Assistance: Organization         Address  Phone   Notes  [facility] Medication [facility] [address] [place], [place] [address] [phone] --Must be a resident of [facility] -- Must have NO insurance coverage whatsoever (no Medicaid/ Medicare, etc.) -- The pt. MUST have a primary care doctor that directs their care regularly and follows them in the community   MedAssist  [phone]   [employer]  [phone]    Agencies that provide inexpensive medical care: Organization         Address  Phone   Notes  [facility]  [phone]   [facility]    [phone]   [facility] [address] [place], [place] [address] [phone]   [facility] [address] [place], [place] [phone]   Planned Parenthood    [phone]   [facility]    [phone]   [facility]  [address], [place] Phone:  [phone], Fax:  [phone] Hours of Operation:  9 am - 6 pm, M-F.  Also accepts Medicaid/Medicare and self-pay.  [facility]  [address], [place] Phone: [phone], Fax: [phone]. Hours of Operation:  8:30 am - 5:30 pm, M-F.  Also accepts Medicaid and self-pay.  [facility] [place] [address], [place] Phone: [phone]   [facility] [address] Natasha Bence [place], [place] [phone], Ext. [phone] Mondays & Thursdays: 7-9 AM.  First 15 patients are seen on a first come, first serve basis.    Medicaid-accepting [facility] Providers:  Organization         Address  Phone   Notes  [facility] [address], [place] [phone]  Also accepts self-pay patients.  [facility] [address] Laurell Josephs [place], [place]  [phone]   [facility] [address], [place] [phone]   [facility] [address] [place] [phone]   Renaye Rakers [address], [place]   [phone] Only accepts [place] patients after they have their name applied to their card.   Self-Pay (no insurance) in [facility]:  Organization         Address  Phone   Notes  Sickle Cell Patients, [facility] [address] [place], [place] [phone]   [facility] [address] [place], [place] [phone]   [facility] [place]  [address], [place] [phone]   [facility]/Dr. Osei-Bonsu  [address] [place] or [address], [place] [phone] Phone number for both [place] and [employer] locations is the same.  [facility] [address]  [place] [phone]   [facility] [address] [place] or [address] [phone]   [facility] [address] [place] [phone], phone; [phone], fax Sees patients 1st and 3rd Saturday of every month.  Must not qualify for public or private insurance (i.e. Medicaid, Medicare, [place] Health Choice, Veterans' Benefits)  Household income should be no more than 200% of the poverty level The clinic cannot treat you if you are pregnant or think you are pregnant  Sexually transmitted diseases are not treated at the clinic.    Dental Care: Organization         Address  Phone  Notes  [facility] Department of [facility] [address] [place], [place] [phone] Accepts children up to age 20 who are enrolled in [place] or [place] Health Choice; pregnant women with a Medicaid card; and children who have applied for Medicaid or [place] Health Choice, but were declined, whose parents can pay a reduced fee at time of service.  [facility]  [address], [place] [phone] Accepts children up to age 68 who are enrolled in [place] or [place] Health Choice; pregnant women with a Medicaid card; and children who have applied for Medicaid or [place] Health Choice, but were declined, whose parents can pay a reduced fee at time of service.  [facility]  [address] [place], [place] [phone] Patients are seen by appointment only. Walk-ins are not accepted. [facility] will see patients 78 years of age and older. Monday - Tuesday (8am-5pm) Most Wednesdays (8:30-5pm) $30 per visit, cash only  [facility] Adult Dental Access PRO[facility]  [address], [facility] [phone] Patients are seen by appointment only. Walk-ins are not accepted. [facility] will see patients 78 years of age and older. One Wednesday Evening  (Monthly: Volunteer Based).  $30 per visit, cash only  [employer]  [phone] for adults; Children under age 69, call Graduate Pediatric Dentistry at [phone]. Children aged 31-14, please call [phone] to request a pediatric application.  Dental services are provided in all areas of dental care including fillings, crowns and bridges, complete and partial dentures, implants, gum treatment, root canals, and extractions. Preventive care is also provided. Treatment  is provided to both adults and children. Patients are selected via a lottery and there is often a waiting list.   [facility] [address], [place]  [phone] [email or web address]   [facility] [address], [place], [place] [phone], Ext. [phone] Second and Fourth Thursday of each month, opens at 6:30 AM; Clinic ends at 9 AM.  Patients are seen on a first-come first-served basis, and a limited number are seen during each clinic.   [facility]  [address], [place], [place] [phone]   Eligibility Requirements You must have lived in [place], [place], or [place] counties for at least the last three months.   You cannot be eligible for state or federal sponsored [place] Cityhealthcare insurance, including CIGNAVeterans Administration, [place]icaid, or [employer]icare.   You generally cannot be eligible for healthcare insurance through your employer.    How to apply: Eligibility screenings are held every Tuesday and Wednesday afternoon from 1:00 pm until 4:00 pm. You do not need an appointment for the interview!  [facility] [address], [place], [place] [phone]   [facility]  [phone]   [facility]  [phone]   [facility] [place] [facility]  [phone]    Behavioral Health Resources in the Community: Intensive Outpatient Programs Organization         Address  Phone  Notes  [facility]  [facility] Services [address], [place], [place] [phone]   [facility] [address], [place], [place] [phone]   [facility] [address], [place], [place]  [phone]   [facility] [address],  [place], [place] [phone] or [phone]   Substance Abuse Resources Organization         Address  Phone  Notes  [facility]  [phone]   [facility]  [phone]   [facility]  [phone]   [facility]  [phone]   Residential & Outpatient Substance Abuse Program  [phone]   Psychological Services Organization         Address  Phone  Notes  [facility]  [phone]- [phone]   [facility]  [phone]- [phone]   [facility] [address], [place] [phone] or [phone]    Mobile Crisis Teams Organization         Address  Phone  Notes  [facility], Mobile Crisis Care Unit  [phone]   [facility]  [address] [place], [place] [phone]   Doristine LocksSharon DeEsch [address] [place] [phone]    Self-Help/Support Groups Organization         Address  Phone             Notes  [facility] [place] - variety of support groups  336- [phone] Call for more information  Narcotics Anonymous (NA), [facility] [address], [place]  2 meetings at this location   Statisticianesidential Treatment Programs Organization         Address  Phone  Notes  [facility] [address],    [place]  [phone]   [facility]  [address], [place] [phone], [place], [place] [phone]   [facility] [address], [place] [phone] Admissions: 8am-3pm M-F  [facility] [address]    [place], [place]  [phone]   [facility] [address] Star[place] Manns  [place], [place] [phone]   [facility] [address],  [place], [place] [phone]   [facility] - Intensive Outpatient [address] Laurell Josephs [address], [place], [place] [phone]   [facility] ([facility] [address] [place].,  [place], [place] [phone] or [phone]   [facility] ([facility]) [address] [place], [place] [phone] Accepts Medicaid  [facility] [address]  [place] [phone] Substance Abuse/Addiction Treatment   [facility] Organization         Address  Phone  Notes  [facility]  [phone]   Angie Fava, PhD [address] Ervin Knack [place], [place]   [phone] or [phone]   [facility] Behavioral   [address] [place], [place] [phone]   [facility] [address] [place], [place] [phone] Insurance/Medicaid/sponsorship through [facility] and Families [address]                                    [place], [place] [phone] Therapy/tele-psych/case  [facility] [address], [place] [phone]    Dr. Lolly Mustache  [phone]   [facility] [facility]  [employer] [facility] 1) [address], [place] 2) [address] [place] 3)  [address], [place] [phone]  [phone]   [facility] Child Abuse Hotline [phone] or [phone] (After Hours)

## 2015-07-06 NOTE — ED Notes (Addendum)
Per pt, states left leg pain without injury-no swelling-does not think this is a SCC

## 2015-07-06 NOTE — ED Provider Notes (Signed)
CSN: [id number]     Arrival date & time 07/06/15  1251 History  By signing my name below, I, Shawn Adams, attest that this documentation has been prepared under the direction and in the presence of [name]. Electronically Signed: Sonum Adams, Neurosurgeon. 07/06/2015. 3:11 PM.    Chief Complaint  Patient presents with  . Leg Pain    The history is provided by the patient. No language interpreter was used.     HPI Comments: Shawn Adams is a 39 y.o. male with a PMH of sickle cell anemia who presents to the Emergency Department complaining of 2 days of intermittent anterior left lower leg and ankle pain. He denies recent injury or trauma to the affected area. He denies exacerbating factors. He has tried tylenol with symptom relief. He has a history of sickle cell anemia, but states this is not similar to his sickle cell crisis pain. He denies recent immobilization or travel, recent surgery, history of malignancy, tobacco use. He denies history of PE/DVT. He denies fever, chills, CP, HA, lightheadedness, dizziness, abdominal pain, nausea, diarrhea, constipation, numbness, paresthesia lower extremity edema. He reports intermittent shortness of breath, which he attributes to asthma.  Past Medical History  Diagnosis Date  . Sickle cell anemia (HCC)   . Aneurysm (HCC)     Pt states happened in 2011, Brain   . Heart murmur    Past Surgical History  Procedure Laterality Date  . Lymph node biopsy     Family History  Problem Relation Age of Onset  . Diabetes Mother    Social History  Substance Use Topics  . Smoking status: Former Games developer  . Smokeless tobacco: Never Used  . Alcohol Use: No     Comment: occasionally    Review of Systems  Constitutional: Negative for fever and chills.  Respiratory: Positive for shortness of breath.   Cardiovascular: Negative for chest pain and leg swelling.  Gastrointestinal: Negative for nausea, vomiting, abdominal pain, diarrhea and  constipation.  Musculoskeletal: Positive for myalgias and arthralgias (left leg and ankle). Negative for joint swelling.  Skin: Negative for color change.  Neurological: Negative for dizziness, light-headedness and headaches.      Allergies  Review of patient's allergies indicates no known allergies.  Home Medications   Prior to Admission medications   Medication Sig Start Date End Date Taking? Authorizing Provider  benzonatate (TESSALON) 100 MG capsule Take 1 capsule (100 mg total) by mouth 3 (three) times daily as needed for cough. Patient not taking: Reported on 01/29/2015 12/01/14   Fayrene Helper, PA-C  Cholecalciferol (VITAMIN D) 2000 UNITS CAPS Take 1 capsule (2,000 Units total) by mouth daily. Patient not taking: Reported on 03/08/2015 01/29/15   Altha Harm, MD  famotidine (PEPCID) 20 MG tablet Take 20 mg by mouth daily.     Historical Provider, MD  folic acid (FOLVITE) 1 MG tablet Take 1 mg by mouth daily.     Historical Provider, MD  guaiFENesin (ROBITUSSIN) 100 MG/5ML liquid Take 5-10 mLs (100-200 mg total) by mouth every 4 (four) hours as needed for cough. Patient not taking: Reported on 01/29/2015 12/01/14   Fayrene Helper, PA-C  hydroxyurea (HYDREA) 500 MG capsule Take 1,000 mg by mouth daily. May take with food to minimize GI side effects.    Historical Provider, MD  oxyCODONE-acetaminophen (PERCOCET) 5-325 MG per tablet Take 1 tablet by mouth every 4 (four) hours as needed for moderate pain.     Historical Provider, MD  predniSONE (DELTASONE)  10 MG tablet Take 10 mg by mouth daily with breakfast.    Historical Provider, MD    BP 139/78 mmHg  Pulse 87  Temp(Src) 98.1 F (36.7 C) (Oral)  Resp 16  SpO2 100% Physical Exam  Constitutional: He is oriented to person, place, and time. He appears well-developed and well-nourished. No distress.  HENT:  Head: Normocephalic and atraumatic.  Right Ear: External ear normal.  Left Ear: External ear normal.  Nose: Nose normal.   Mouth/Throat: Uvula is midline, oropharynx is clear and moist and mucous membranes are normal.  Eyes: Conjunctivae, EOM and lids are normal. Pupils are equal, round, and reactive to light. Right eye exhibits no discharge. Left eye exhibits no discharge. No scleral icterus.  Neck: Normal range of motion. Neck supple.  Cardiovascular: Normal rate, regular rhythm, normal heart sounds, intact distal pulses and normal pulses.   Pulmonary/Chest: Effort normal and breath sounds normal. No respiratory distress. He has no wheezes. He has no rales.  Abdominal: Soft. Normal appearance. He exhibits no distension and no mass. There is no tenderness. There is no rigidity, no rebound and no guarding.  Musculoskeletal: Normal range of motion. He exhibits tenderness. He exhibits no edema.  Mild tenderness to palpation over anterior aspect of left lateral shin. Mild tenderness to palpation over medial and lateral aspects of ankle bilaterally. No edema or erythema. No palpable cords, varicosities, or masses. Full range of motion of left lower extremity. Strength and sensation intact. Distal pulses intact.  Neurological: He is alert and oriented to person, place, and time. He has normal strength. No sensory deficit.  Skin: Skin is warm, dry and intact. No rash noted. He is not diaphoretic. No erythema. No pallor.  Psychiatric: He has a normal mood and affect. His speech is normal and behavior is normal. Judgment and thought content normal.  Nursing note and vitals reviewed.   ED Course  Procedures (including critical care time)  DIAGNOSTIC STUDIES: Oxygen Saturation is 100% on RA, normal by my interpretation.    COORDINATION OF CARE: 3:20 PM Discussed treatment plan with pt at bedside and pt agreed to plan.   Labs Review Labs Reviewed - No data to display  Imaging Review Dg Ankle Complete Left  07/06/2015   CLINICAL DATA:  Pain.  Sickle cell disease.  No recent trauma  EXAM: LEFT ANKLE COMPLETE - 3+ VIEW   COMPARISON:  None.  FINDINGS: Frontal, oblique, and lateral views were obtained. There is no demonstrable fracture or effusion. The ankle mortise appears intact. No erosive change. No bony destruction. No appreciable joint space narrowing.  IMPRESSION: No fracture or bony destruction. The ankle mortise appears intact. No joint effusion.   Electronically Signed   By: Bretta Bang III M.D.   On: 07/06/2015 15:56   I have personally reviewed and evaluated these images as part of my medical decision-making.   EKG Interpretation None      MDM   Final diagnoses:  Left leg pain    63 male presents with anterior left lower extremity and ankle pain, which he states started 2 ago. He denies recent immobilization or travel, recent surgery, history of malignancy, tobacco use, history of PE/DVT, fever, chills, CP, HA, lightheadedness, dizziness, abdominal pain, nausea, diarrhea, constipation, numbness, paresthesia lower extremity edema, recent injury. He reports intermittent shortness of breath, which he attributes to asthma.  Patient is afebrile. Vitals signs stable. O2 sat 99-100% on RA. Heart RRR. Lungs clear to auscultation bilaterally. Abdomen soft, non-tender, non-distended. No  lower extremity edema. Mild TTP of anterior aspect of left lateral shin. Mild tenderness to palpation over medial and lateral aspects of ankle bilaterally. No palpable cords, varicosities, or masses. Full range of motion of left lower extremity. Strength and sensation intact. Distal pulses intact.  Will obtain imaging of left ankle given bony tenderness.  Imaging negative for fracture, bony destruction, effusion.  Doubt DVT, wells criteria negative. Symptoms most likely muscular. Will treat with ibuprofen. Patient to follow-up with PCP. Return precautions discussed.  I personally performed the services described in this documentation, which was scribed in my presence. The recorded information has been reviewed and is  accurate.  BP 139/78 mmHg  Pulse 87  Temp(Src) 98.1 F (36.7 C) (Oral)  Resp 16  SpO2 100%   Mady Gemmaall, PA-C 07/06/15 2319  Arby Barrette, MD 07/09/15 1408

## 2015-07-06 NOTE — Telephone Encounter (Signed)
Patient called complaining of pain all over, rates 7/10. Last pain medication tylenol 500 mg taken at 0900. Also complaining of SOB while lying down. Denies fever, chest pain, nausea vomiting and abdominal pain. Would like to come to the day hospital for treatment. Told patient I would page provider and call back. Patient verbalizes understanding.

## 2015-07-08 ENCOUNTER — Encounter: Payer: Self-pay | Admitting: Family Medicine

## 2015-07-08 ENCOUNTER — Ambulatory Visit (INDEPENDENT_AMBULATORY_CARE_PROVIDER_SITE_OTHER): Payer: Medicare Other | Admitting: Family Medicine

## 2015-07-08 ENCOUNTER — Telehealth (HOSPITAL_COMMUNITY): Payer: Self-pay

## 2015-07-08 ENCOUNTER — Non-Acute Institutional Stay (HOSPITAL_COMMUNITY)
Admission: AD | Admit: 2015-07-08 | Discharge: 2015-07-08 | Disposition: A | Discharge status: Home / Self Care | Payer: Medicare Other | Attending: Internal Medicine | Admitting: Internal Medicine

## 2015-07-08 VITALS — BP 131/76 | HR 68 | Temp 98.3°F | Resp 16 | Ht 72.0 in | Wt 137.0 lb

## 2015-07-08 DIAGNOSIS — Z79891 Long term (current) use of opiate analgesic: Secondary | ICD-10-CM | POA: Insufficient documentation

## 2015-07-08 DIAGNOSIS — D5700 Hb-SS disease with crisis, unspecified: Secondary | ICD-10-CM | POA: Diagnosis not present

## 2015-07-08 DIAGNOSIS — Z79899 Other long term (current) drug therapy: Secondary | ICD-10-CM | POA: Insufficient documentation

## 2015-07-08 DIAGNOSIS — Z87891 Personal history of nicotine dependence: Secondary | ICD-10-CM | POA: Insufficient documentation

## 2015-07-08 DIAGNOSIS — M25572 Pain in left ankle and joints of left foot: Secondary | ICD-10-CM | POA: Insufficient documentation

## 2015-07-08 DIAGNOSIS — Z7952 Long term (current) use of systemic steroids: Secondary | ICD-10-CM | POA: Diagnosis not present

## 2015-07-08 DIAGNOSIS — Z791 Long term (current) use of non-steroidal anti-inflammatories (NSAID): Secondary | ICD-10-CM | POA: Diagnosis not present

## 2015-07-08 DIAGNOSIS — E559 Vitamin D deficiency, unspecified: Secondary | ICD-10-CM

## 2015-07-08 DIAGNOSIS — Z23 Encounter for immunization: Secondary | ICD-10-CM

## 2015-07-08 LAB — COMPREHENSIVE METABOLIC PANEL
ALBUMIN: 4 g/dL (ref 3.5–5.0)
ALT: 25 U/L (ref 17–63)
ANION GAP: 5 (ref 5–15)
AST: 39 U/L (ref 15–41)
Alkaline Phosphatase: 77 U/L (ref 38–126)
BUN: 18 mg/dL (ref 6–20)
CO2: 23 mmol/L (ref 22–32)
Calcium: 8.8 mg/dL — ABNORMAL LOW (ref 8.9–10.3)
Chloride: 110 mmol/L (ref 101–111)
Creatinine, Ser: 1.24 mg/dL (ref 0.61–1.24)
GFR calc Af Amer: >60 mL/min (ref >60)
GFR calc non Af Amer: >60 mL/min (ref >60)
Glucose, Bld: 105 mg/dL — ABNORMAL HIGH (ref 65–99)
POTASSIUM: 3.6 mmol/L (ref 3.5–5.1)
Sodium: 138 mmol/L (ref 135–145)
Total Bilirubin: 1.8 mg/dL — ABNORMAL HIGH (ref 0.3–1.2)
Total Protein: 7.3 g/dL (ref 6.5–8.1)

## 2015-07-08 LAB — POCT URINALYSIS DIP (DEVICE)
BILIRUBIN URINE: NEGATIVE
GLUCOSE, UA: NEGATIVE mg/dL
Hgb urine dipstick: NEGATIVE
Ketones, ur: NEGATIVE mg/dL
Leukocytes, UA: NEGATIVE
NITRITE: NEGATIVE
PH: 6 (ref 5.0–8.0)
Protein, ur: NEGATIVE mg/dL
Specific Gravity, Urine: 1.015 (ref 1.005–1.030)
Urobilinogen, UA: 0.2 mg/dL (ref 0.0–1.0)

## 2015-07-08 LAB — CBC WITH DIFFERENTIAL/PLATELET
Basophils Absolute: 0.1 10*3/uL (ref 0.0–0.1)
Basophils Relative: 1 %
EOS PCT: 2 %
Eosinophils Absolute: 0.1 10*3/uL (ref 0.0–0.7)
HEMATOCRIT: 18.2 % — AB (ref 39.0–52.0)
HEMOGLOBIN: 6.7 g/dL — AB (ref 13.0–17.0)
LYMPHS PCT: 43 %
Lymphs Abs: 2.4 10*3/uL (ref 0.7–4.0)
MCH: 38.5 pg — ABNORMAL HIGH (ref 26.0–34.0)
MCHC: 36.8 g/dL — ABNORMAL HIGH (ref 30.0–36.0)
MCV: 104.6 fL — ABNORMAL HIGH (ref 78.0–100.0)
MONOS PCT: 8 %
Monocytes Absolute: 0.4 10*3/uL (ref 0.1–1.0)
NEUTROS ABS: 2.6 10*3/uL (ref 1.7–7.7)
Neutrophils Relative %: 46 %
Platelets: 324 10*3/uL (ref 150–400)
RBC: 1.74 MIL/uL — ABNORMAL LOW (ref 4.22–5.81)
RDW: 21.3 % — AB (ref 11.5–15.5)
WBC: 5.6 10*3/uL (ref 4.0–10.5)
nRBC: 6 /100 WBC — ABNORMAL HIGH

## 2015-07-08 LAB — LACTATE DEHYDROGENASE: LDH: 378 U/L — ABNORMAL HIGH (ref 98–192)

## 2015-07-08 LAB — RETICULOCYTES
RBC.: 1.74 MIL/uL — AB (ref 4.22–5.81)
RETIC COUNT ABSOLUTE: 241.9 10*3/uL — AB (ref 19.0–186.0)
Retic Ct Pct: 13.9 % — ABNORMAL HIGH (ref 0.4–3.1)

## 2015-07-08 MED ORDER — ONDANSETRON HCL 4 MG/2ML IJ SOLN
INTRAMUSCULAR | Status: AC
  Filled 2015-07-08: qty 2

## 2015-07-08 MED ORDER — DEXTROSE-NACL 5-0.45 % IV SOLN
INTRAVENOUS | Status: DC
  Administered 2015-07-08: 15:00:00 via INTRAVENOUS

## 2015-07-08 MED ORDER — HYDROMORPHONE HCL 2 MG/ML IJ SOLN
2.0000 mg | Freq: Once | INTRAMUSCULAR | Status: AC
  Administered 2015-07-08: 2 mg via INTRAVENOUS
  Filled 2015-07-08: qty 1

## 2015-07-08 MED ORDER — OXYCODONE-ACETAMINOPHEN 5-325 MG PO TABS
1.0000 | ORAL_TABLET | Freq: Once | ORAL | Status: AC
  Administered 2015-07-08: 1 via ORAL
  Filled 2015-07-08: qty 1

## 2015-07-08 MED ORDER — HYDROMORPHONE HCL 2 MG/ML IJ SOLN
1.0000 mg | Freq: Once | INTRAMUSCULAR | Status: AC
  Administered 2015-07-08: 1 mg via INTRAVENOUS
  Filled 2015-07-08: qty 1

## 2015-07-08 MED ORDER — ONDANSETRON HCL 4 MG/2ML IJ SOLN
4.0000 mg | Freq: Once | INTRAMUSCULAR | Status: AC
  Administered 2015-07-08: 4 mg via INTRAVENOUS

## 2015-07-08 MED ORDER — ONDANSETRON 4 MG PO TBDP
4.0000 mg | ORAL_TABLET | Freq: Once | ORAL | Status: DC
  Filled 2015-07-08: qty 1

## 2015-07-08 NOTE — Discharge Summary (Signed)
[facility] Discharge Summary   Patient ID: Shawn Adams MRN: [id number] DOB/AGE: Nov 12, 1975 39 y.o.  Admit date: 07/08/2015 Discharge date: 07/08/2015  Primary Care Physician:  Shawn Plum, MD  Admission Diagnoses:  Active Problems:   Sickle cell anemia with crisis [facility])   Discharge Diagnoses:   Sickle cell anemia  Discharge Medications:    Medication List    ASK your doctor about these medications        famotidine 20 MG tablet  Commonly known as:  PEPCID  Take 20 mg by mouth daily.     folic acid 1 MG tablet  Commonly known as:  FOLVITE  Take 1 mg by mouth daily.     guaiFENesin 100 MG/5ML liquid  Commonly known as:  ROBITUSSIN  Take 5-10 mLs (100-200 mg total) by mouth every 4 (four) hours as needed for cough.     hydroxyurea 500 MG capsule  Commonly known as:  HYDREA  Take 1,000 mg by mouth daily. May take with food to minimize GI side effects.     ibuprofen 800 MG tablet  Commonly known as:  ADVIL,MOTRIN  Take 1 tablet (800 mg total) by mouth 3 (three) times daily.     oxyCODONE-acetaminophen 5-325 MG tablet  Commonly known as:  PERCOCET/ROXICET  Take 1 tablet by mouth every 4 (four) hours as needed for moderate pain.     predniSONE 10 MG tablet  Commonly known as:  DELTASONE  Take 10 mg by mouth daily with breakfast.     Vitamin D 2000 UNITS Caps  Take 1 capsule (2,000 Units total) by mouth daily.         Consults:  None  Significant Diagnostic Studies:  Dg Ankle Complete Left  07/06/2015  CLINICAL DATA:  Pain.  Sickle cell disease.  No recent trauma EXAM: LEFT ANKLE COMPLETE - 3+ VIEW COMPARISON:  None. FINDINGS: Frontal, oblique, and lateral views were obtained. There is no demonstrable fracture or effusion. The ankle mortise appears intact. No erosive change. No bony destruction. No appreciable joint space narrowing. IMPRESSION: No fracture or bony destruction. The ankle mortise appears intact. No joint effusion.  Electronically Signed   By: Shawn Adams Adams.D.   On: 07/06/2015 15:56     [facility] Course:  [name] was transitioned from clinic sick[facility] cell day hospital for extended observation. Patient was started on IV fluids for cellular re-hydration. Reviewed labs, consistent with baseline. Gave 3 mg of Dilaudid per IV push. Patient maintains that pain intensity decreased from 8/10 to 4/10. He states that he can manage at home on current medication regimen. Patient encouraged to hydrate and take medications consistently.  Patient is to follow up in office as discussed.  Encouraged patient to reschedule appointment with hematology.    Physical Exam at Discharge:  BP 125/58 mmHg  Pulse 72  Temp(Src) 97.8 F (36.6 C) (Oral)  Resp 16  SpO2 96%  General Appearance:    Alert, cooperative, no distress, appears stated age  Head:    Normocephalic, without obvious abnormality, atraumatic  Eyes:    PERRL, conjunctiva/corneas clear, EOM's intact, fundi    benign, both eyes       Neck:   Supple, symmetrical, trachea midline, no adenopathy;       thyroid:  No enlargement/tenderness/nodules; no carotid   bruit or JVD  Lungs:     Clear to auscultation bilaterally, respirations unlabored  Chest wall:    No tenderness or deformity  Heart:  Regular rate and rhythm, S1 and S2 normal, no murmur, rub   or gallop  Extremities:   Mild tenderness to left ankle, Extremities normal, atraumatic, no cyanosis or edema  Pulses:   2+ and symmetric all extremities  Skin:   Skin color, texture, turgor normal, no rashes or lesions  Lymph nodes:   Cervical, supraclavicular, and axillary nodes normal  Neurologic:   CNII-XII intact. Normal strength, sensation and reflexes      throughout       Disposition at Discharge: 01-Home or Self Care  Discharge Orders:   Condition at Discharge:   Stable  Time spent on Discharge:  15 minutes  Signed: Gordan Grell Adams 07/08/2015, 4:58 PM

## 2015-07-08 NOTE — Discharge Instructions (Signed)
Sickle Cell Anemia, Adult Sickle cell anemia is a condition in which red blood cells have an abnormal "sickle" shape. This abnormal shape shortens the cells' life span, which results in a lower than normal concentration of red blood cells in the blood. The sickle shape also causes the cells to clump together and block free blood flow through the blood vessels. As a result, the tissues and organs of the body do not receive enough oxygen. Sickle cell anemia causes organ damage and pain and increases the risk of infection. CAUSES  Sickle cell anemia is a genetic disorder. Those who receive two copies of the gene have the condition, and those who receive one copy have the trait. RISK FACTORS The sickle cell gene is most common in people whose families originated in [place]. Other areas of the globe where sickle cell trait occurs include the Mediterranean, [place] and [place], the [place], and the [place].  SIGNS AND SYMPTOMS  Pain, especially in the extremities, back, chest, or abdomen (common). The pain may start suddenly or may develop following an illness, especially if there is dehydration. Pain can also occur due to overexertion or exposure to extreme temperature changes.  Frequent severe bacterial infections, especially certain types of pneumonia and meningitis.  Pain and swelling in the hands and feet.  Decreased activity.   Loss of appetite.   Change in behavior.  Headaches.  Seizures.  Shortness of breath or difficulty breathing.  Vision changes.  Skin ulcers. Those with the trait may not have symptoms or they may have mild symptoms.  DIAGNOSIS  Sickle cell anemia is diagnosed with blood tests that demonstrate the genetic trait. It is often diagnosed during the newborn period, due to mandatory testing nationwide. A variety of blood tests, X-rays, CT scans, MRI scans, ultrasounds, and lung function tests may also be done to monitor the condition. TREATMENT  Sickle  cell anemia may be treated with:  Medicines. You may be given pain medicines, antibiotic medicines (to treat and prevent infections) or medicines to increase the production of certain types of hemoglobin.  Fluids.  Oxygen.  Blood transfusions. HOME CARE INSTRUCTIONS   Drink enough fluid to keep your urine clear or pale yellow. Increase your fluid intake in hot weather and during exercise.  Do not smoke. Smoking lowers oxygen levels in the blood.   Only take over-the-counter or prescription medicines for pain, fever, or discomfort as directed by your health care provider.  Take antibiotics as directed by your health care provider. Make sure you finish them it even if you start to feel better.   Take supplements as directed by your health care provider.   Consider wearing a medical alert bracelet. This tells anyone caring for you in an emergency of your condition.   When traveling, keep your medical information, health care provider's names, and the medicines you take with you at all times.   If you develop a fever, do not take medicines to reduce the fever right away. This could cover up a problem that is developing. Notify your health care provider.  Keep all follow-up appointments with your health care provider. Sickle cell anemia requires regular medical care. SEEK MEDICAL CARE IF: You have a fever. SEEK IMMEDIATE MEDICAL CARE IF:   You feel dizzy or faint.   You have new abdominal pain, especially on the left side near the stomach area.   You develop a persistent, often uncomfortable and painful penile erection (priapism). If this is not treated immediately it   will lead to impotence.   You have numbness your arms or legs or you have a hard time moving them.   You have a hard time with speech.   You have a fever or persistent symptoms for more than 2-3 days.   You have a fever and your symptoms suddenly get worse.   You have signs or symptoms of infection.  These include:   Chills.   Abnormal tiredness (lethargy).   Irritability.   Poor eating.   Vomiting.   You develop pain that is not helped with medicine.   You develop shortness of breath.  You have pain in your chest.   You are coughing up pus-like or bloody sputum.   You develop a stiff neck.  Your feet or hands swell or have pain.  Your abdomen appears bloated.  You develop joint pain. MAKE SURE YOU:  Understand these instructions.   This information is not intended to replace advice given to you by your health care provider. Make sure you discuss any questions you have with your health care provider.   Document Released: 12/21/2005 Document Revised: 10/03/2014 Document Reviewed: 04/24/2013 Elsevier Interactive Patient Education 2016 [employer] Inc[employer]  

## 2015-07-08 NOTE — Telephone Encounter (Signed)
After speaking further with the patient it was decided that he was actually trying to make an appointment at the clinic rather than at the day hospital. Call transferred to secretary to make appropriate appointment.

## 2015-07-08 NOTE — Progress Notes (Signed)
Subjective:    Patient ID: Shawn DollarLuke Adams, male    DOB: [birth year], 39 y.o.   MRN: [id number]   HPI [name], a 39 year old male with a history of sickle cell anemia, HbSS presents for increased pain to left ankle. He states that he awakened on Saturday, October 8th, with left ankle pain. He was evaluated in the emergency department on 07/06/2015. A left ankle xray was done, which was unremarkable. He states that left ankle pain is different from typical pain related to sickle cell anemia He states that he last had Percocet 5-325 mg this am with minimal relief. He states that current pain intensity is 8/10 described as constant and aching. Patient denies fatigue, chest pains, nausea, vomiting, and diarrhea.  Immunization History  Administered Date(s) Administered  . Influenza,inj,Quad PF,36+ Mos 06/21/2014   Past Medical History  Diagnosis Date  . Sickle cell anemia (HCC)   . Aneurysm (HCC)     Pt states happened in 2011, Brain   . Heart murmur    Social History   Social History  . Marital Status: Single    Spouse Name: N/A  . Number of Children: N/A  . Years of Education: N/A   Occupational History  . Not on file.   Social History Main Topics  . Smoking status: Former Games developermoker  . Smokeless tobacco: Never Used  . Alcohol Use: No     Comment: occasionally  . Drug Use: No     Comment: Pt states uses 2 times a day  . Sexual Activity: Yes    Birth Control/ Protection: None   Other Topics Concern  . Not on file   Social History Narrative   Review of Systems  Constitutional: Negative.  Negative for fatigue.  HENT: Negative.   Eyes: Negative.   Respiratory: Negative.   Cardiovascular: Negative.   Gastrointestinal: Negative.   Endocrine: Negative.   Genitourinary: Negative.   Musculoskeletal: Positive for myalgias (left ankle pain).  Skin: Negative.   Allergic/Immunologic: Negative.   Neurological: Negative.  Negative for dizziness, light-headedness and  headaches.  Hematological: Negative.   Psychiatric/Behavioral: Negative.       Objective:   Physical Exam  Constitutional: He is oriented to person, place, and time.  HENT:  Head: Normocephalic and atraumatic.  Right Ear: External ear normal.  Nose: Nose normal.  Mouth/Throat: Oropharynx is clear and moist.  Eyes: Conjunctivae and EOM are normal. Pupils are equal, round, and reactive to light.  Neck: Normal range of motion. Neck supple.  Cardiovascular: Normal rate, regular rhythm, normal heart sounds and intact distal pulses.   Pulmonary/Chest: Effort normal and breath sounds normal.  Abdominal: Soft. Bowel sounds are normal.  Musculoskeletal: Normal range of motion.       Left ankle: He exhibits normal range of motion, no swelling and normal pulse. Tenderness.       Left foot: There is normal range of motion, no tenderness, no swelling and normal capillary refill.  Neurological: He is alert and oriented to person, place, and time. He has normal reflexes.  Skin: Skin is warm and dry.  Psychiatric: He has a normal mood and affect. His behavior is normal. Judgment and thought content normal.      BP 131/76 mmHg  Pulse 68  Temp(Src) 98.3 F (36.8 C) (Oral)  Resp 16  Ht 6' (1.829 m)  Wt 137 lb (62.143 kg)  BMI 18.58 kg/m2 Assessment & Plan:  1. Sickle cell pain crisis [facility](HCC) Patient will transition  to the day infusion center.   Patient will be admitted to the day infusion center for extended observation  Start IV D5.45 for cellular rehydration at 125/hr  Will not start anti-inflammatory, patient is on daily prednisone and has take it today.  Will give dilaudid IV   Patient will be re-evaluated for pain intensity in the context of function and relationship to baseline as care progresses.  If no significant pain relief, will transfer patient to inpatient services for a higher level of care.   Will check CMP, LDH and CBC w/differential  Sickle cell disease - Continue  Hydrea 1000 mg daily. Patient is followed by Hematology at [facility], [place], [place]. We discussed the need for good hydration, monitoring of hydration status, avoidance of heat, cold, stress, and infection triggers. We discussed the risks and benefits of Hydrea, including bone marrow suppression, the possibility of GI upset, skin ulcers, hair thinning, and teratogenicity. The patient was reminded of the need to seek medical attention of any symptoms of bleeding, anemia, or infection. Continue folic acid 1 mg daily to prevent aplastic bone marrow crises. Checked urine for protein, none present.    Pulmonary evaluation - Patient denies severe recurrent wheezes, shortness of breath with exercise, or persistent cough. Patient is followed by pulmonologist at [facility]  Cardiac - Routine screening for pulmonary hypertension is not recommended.  Eye - High risk of proliferative retinopathy. Annual eye exam with retinal exam recommended to patient.  Immunization status - Reviewed [facility], patient will receive influenza and TDap vaccinations today.   Acute and chronic painful episodes - Patient has been receiving pain medications from hematology. Reviewed the [facility] Substance reporting system, no inconsistencies noted. 8. Vitamin D deficiency - Drisdol 50,000 units weekly, we encouraged her to take it.   - POCT urinalysis dipstick  2. Left ankle pain  Reviewed left ankle x-ray, unremarkable.   3. Need for immunization against influenza  - Flu Vaccine QUAD 36+ mos IM (Fluarix)  4. Need for Tdap vaccination  - Tdap vaccine greater than or equal to 7yo IM  5. Vitamin D deficiency  - Vitamin D, 25-hydroxy   The patient was given clear instructions to go to ER or return to medical center if symptoms do not improve, worsen or new problems develop. The patient verbalized understanding. Will notify patient with laboratory results. Massie Maroon, FNP

## 2015-07-08 NOTE — H&P (Signed)
[facility] History and Physical   Date: 07/08/2015  Patient name: Shawn Adams Medical record number: [id number] Date of birth: 26-Jul-1976 Age: 39 y.o. Gender: male PCP: Jackie PlumSEI-BONSU,GEORGE, MD  Attending physician: Quentin Angstlugbemiga E [name], MD  Chief Complaint: Left ankle pain History of Present Illness: Mr. Shawn DollarLuke Jarboe, a 39 year old male with a history of sickle cell anemia, HbSS presents for increased pain to left ankle. Patient was transitioned from internal medicine clinic. He states that he awakened on Saturday, October 8th, with left ankle pain. He was evaluated in the emergency department on 07/06/2015. A left ankle xray was done, which was unremarkable. He states that left ankle pain is different from typical pain related to sickle cell anemia He states that he last had Percocet 5-325 mg this am with minimal relief. He states that current pain intensity is 8/10 described as constant and aching. Patient denies fatigue, chest pains, nausea, vomiting, and diarrhea.   Meds: Prescriptions prior to admission  Medication Sig Dispense Refill Last Dose  . Cholecalciferol (VITAMIN D) 2000 UNITS CAPS Take 1 capsule (2,000 Units total) by mouth daily. 30 capsule 11 Taking  . famotidine (PEPCID) 20 MG tablet Take 20 mg by mouth daily.    Taking  . folic acid (FOLVITE) 1 MG tablet Take 1 mg by mouth daily.    Taking  . guaiFENesin (ROBITUSSIN) 100 MG/5ML liquid Take 5-10 mLs (100-200 mg total) by mouth every 4 (four) hours as needed for cough. (Patient not taking: Reported on 01/29/2015) 60 mL 0 Not Taking  . hydroxyurea (HYDREA) 500 MG capsule Take 1,000 mg by mouth daily. May take with food to minimize GI side effects.   Taking  . ibuprofen (ADVIL,MOTRIN) 800 MG tablet Take 1 tablet (800 mg total) by mouth 3 (three) times daily. 21 tablet 0 Taking  . oxyCODONE-acetaminophen (PERCOCET) 5-325 MG per tablet Take 1 tablet by mouth every 4 (four) hours as needed for moderate pain.     Taking  . predniSONE (DELTASONE) 10 MG tablet Take 10 mg by mouth daily with breakfast.   Taking    Allergies: Review of patient's allergies indicates no known allergies. Past Medical History  Diagnosis Date  . Sickle cell anemia (HCC)   . Aneurysm (HCC)     Pt states happened in 2011, Brain   . Heart murmur    Past Surgical History  Procedure Laterality Date  . Lymph node biopsy     Family History  Problem Relation Age of Onset  . Diabetes Mother    Social History   Social History  . Marital Status: Single    Spouse Name: N/A  . Number of Children: N/A  . Years of Education: N/A   Occupational History  . Not on file.   Social History Main Topics  . Smoking status: Former Games developermoker  . Smokeless tobacco: Never Used  . Alcohol Use: No     Comment: occasionally  . Drug Use: No     Comment: Pt states uses 2 times a day  . Sexual Activity: Yes    Birth Control/ Protection: None   Other Topics Concern  . Not on file   Social History Narrative    Review of Systems: Constitutional: negative Eyes: negative for icterus Ears, nose, mouth, throat, and face: negative Respiratory: negative for cough and dyspnea on exertion Cardiovascular: negative for chest pain, dyspnea, fatigue, near-syncope and syncope Gastrointestinal: negative for abdominal pain Genitourinary:negative Integument/breast: negative Hematologic/lymphatic: negative Musculoskeletal:positive for myalgias Neurological:  negative Behavioral/Psych: negative Endocrine: negative Allergic/Immunologic: negative  Physical Exam: There were no vitals taken for this visit.   General Appearance:    Alert, cooperative, mild distress, appears stated age  Head:    Normocephalic, without obvious abnormality, atraumatic  Eyes:    PERRL, conjunctiva/corneas clear, EOM's intact, fundi    benign, both eyes       Ears:    Normal TM's and external ear canals, both ears  Nose:   Nares normal, septum midline, mucosa  normal, no drainage    or sinus tenderness  Throat:   Lips, mucosa, and tongue normal; teeth and gums normal  Neck:   Supple, symmetrical, trachea midline, no adenopathy;       thyroid:  No enlargement/tenderness/nodules; no carotid   bruit or JVD  Back:     Symmetric, no curvature, ROM normal, no CVA tenderness  Lungs:     Clear to auscultation bilaterally, respirations unlabored  Chest wall:    No tenderness or deformity  Heart:    Regular rate and rhythm, S1 and S2 normal, no murmur, rub   or gallop  Abdomen:     Soft, non-tender, bowel sounds active all four quadrants,    no masses, no organomegaly  Extremities:   Left lateral ankle tender to palpation. Normal range of motion. Extremities normal, atraumatic, no cyanosis or edema  Pulses:   2+ and symmetric all extremities  Skin:   Skin color, texture, turgor normal, no rashes or lesions  Lymph nodes:   Cervical, supraclavicular, and axillary nodes normal  Neurologic:   CNII-XII intact. Normal strength, sensation and reflexes      throughout    Lab results: Results for orders placed or performed during the hospital encounter of 07/08/15 (from the past 24 hour(s))  POCT urinalysis dip (device)     Status: None   Collection Time: 07/08/15  2:12 PM  Result Value Ref Range   Glucose, UA NEGATIVE NEGATIVE mg/dL   Bilirubin Urine NEGATIVE NEGATIVE   Ketones, ur NEGATIVE NEGATIVE mg/dL   Specific Gravity, Urine 1.015 1.005 - 1.030   Hgb urine dipstick NEGATIVE NEGATIVE   pH 6.0 5.0 - 8.0   Protein, ur NEGATIVE NEGATIVE mg/dL   Urobilinogen, UA 0.2 0.0 - 1.0 mg/dL   Nitrite NEGATIVE NEGATIVE   Leukocytes, UA NEGATIVE NEGATIVE    Imaging results:  Dg Ankle Complete Left  07/06/2015  CLINICAL DATA:  Pain.  Sickle cell disease.  No recent trauma EXAM: LEFT ANKLE COMPLETE - 3+ VIEW COMPARISON:  None. FINDINGS: Frontal, oblique, and lateral views were obtained. There is no demonstrable fracture or effusion. The ankle mortise appears  intact. No erosive change. No bony destruction. No appreciable joint space narrowing. IMPRESSION: No fracture or bony destruction. The ankle mortise appears intact. No joint effusion. Electronically Signed   By: Bretta Bang III M.D.   On: 07/06/2015 15:56     Assessment & Plan:  Patient will be admitted to the day infusion center for extended observation  Start IV D5.45 for cellular rehydration at 125/hr  Will not start Toradol for inflammation. Patient is on daily prednisone per pulmonologist  Will give Dilaudid 1 mg per IV push  Will Start Dilaudid PCA High Concentration per weight based protocol if pain intensity is greater than 7/10  Patient will be re-evaluated for pain intensity in the context of function and relationship to baseline as care progresses.  If no significant pain relief, will transfer patient to inpatient services for a  higher level of care.   Will check CMP, reticulocytes,  LDH and CBC w/differential  Nole Robey M 07/08/2015, 2:15 PM

## 2015-07-08 NOTE — Telephone Encounter (Signed)
Patient called complaining of pain in his left left rates pain 8/10. Last pain medication tylenol 650mg  taken at 0500. Patient denies fever, chest pain, SOB, nausea, vomiting, diarrhea, or abdominal pain. He would like to come to the day hospital for treatment. Told him I would notify provider and call him back

## 2015-07-08 NOTE — Progress Notes (Signed)
Pt received from the [facility] for extended observation for pain. Pt was given IV pushes of Dilaudid and Zofran. Pt had an IV infusion at a rate of 125/hr. Pt's pain has gone from 8 to 3. Now discharging to home. Pt reminded to drink plenty of fluid and take his pain med as needed. He has an appt on Nov 3rd at 1430. Pt voiced understanding.

## 2015-07-08 NOTE — Progress Notes (Addendum)
CRITICAL VALUE ALERT  Critical value received:  hgb 6.7   Date of notification:  07/08/2015  Time of notification:  1613  Critical value read back:Yes.    Nurse who received alert:  G.Laikynn Pollio,RN  MD notified (1st page):  C. Hart RochesterHollis, NP  Time of first page:  1614  MD notified (2nd page):  Time of second page:  Responding MD:  C.Hollis, NP  Time MD responded:  [phone]

## 2015-07-09 LAB — VITAMIN D 25 HYDROXY (VIT D DEFICIENCY, FRACTURES): Vit D, 25-Hydroxy: 31 ng/mL (ref 30–100)

## 2015-07-30 ENCOUNTER — Other Ambulatory Visit: Payer: Self-pay | Admitting: Family Medicine

## 2015-07-30 ENCOUNTER — Ambulatory Visit (INDEPENDENT_AMBULATORY_CARE_PROVIDER_SITE_OTHER): Payer: Medicare Other | Admitting: Family Medicine

## 2015-07-30 ENCOUNTER — Encounter: Payer: Self-pay | Admitting: Family Medicine

## 2015-07-30 ENCOUNTER — Ambulatory Visit (HOSPITAL_COMMUNITY)
Admission: RE | Admit: 2015-07-30 | Discharge: 2015-07-30 | Disposition: A | Discharge status: Home / Self Care | Payer: Medicare Other | Source: Ambulatory Visit | Attending: Family Medicine | Admitting: Family Medicine

## 2015-07-30 VITALS — BP 121/63 | HR 87 | Temp 98.8°F | Resp 16 | Ht 72.0 in | Wt 134.0 lb

## 2015-07-30 DIAGNOSIS — R5383 Other fatigue: Secondary | ICD-10-CM | POA: Diagnosis not present

## 2015-07-30 DIAGNOSIS — M25552 Pain in left hip: Secondary | ICD-10-CM

## 2015-07-30 DIAGNOSIS — D571 Sickle-cell disease without crisis: Secondary | ICD-10-CM

## 2015-07-30 DIAGNOSIS — Z202 Contact with and (suspected) exposure to infections with a predominantly sexual mode of transmission: Secondary | ICD-10-CM

## 2015-07-30 DIAGNOSIS — Z113 Encounter for screening for infections with a predominantly sexual mode of transmission: Secondary | ICD-10-CM

## 2015-07-30 LAB — POCT URINALYSIS DIP (DEVICE)
BILIRUBIN URINE: NEGATIVE
Glucose, UA: NEGATIVE mg/dL
Ketones, ur: NEGATIVE mg/dL
LEUKOCYTES UA: NEGATIVE
NITRITE: NEGATIVE
Protein, ur: 30 mg/dL — AB
SPECIFIC GRAVITY, URINE: 1.015 (ref 1.005–1.030)
Urobilinogen, UA: 1 mg/dL (ref 0.0–1.0)
pH: 6.5 (ref 5.0–8.0)

## 2015-07-30 NOTE — Patient Instructions (Addendum)
Follow up with hematologist as scheduled.  Will notify patient with laboratory results.   Sickle Cell Anemia, Adult Sickle cell anemia is a condition in which red blood cells have an abnormal "sickle" shape. This abnormal shape shortens the cells' life span, which results in a lower than normal concentration of red blood cells in the blood. The sickle shape also causes the cells to clump together and block free blood flow through the blood vessels. As a result, the tissues and organs of the body do not receive enough oxygen. Sickle cell anemia causes organ damage and pain and increases the risk of infection. CAUSES  Sickle cell anemia is a genetic disorder. Those who receive two copies of the gene have the condition, and those who receive one copy have the trait. RISK FACTORS The sickle cell gene is most common in people whose families originated in [place]. Other areas of the globe where sickle cell trait occurs include the [place], [place], the [place], and the [place].  SIGNS AND SYMPTOMS  Pain, especially in the extremities, back, chest, or abdomen (common). The pain may start suddenly or may develop following an illness, especially if there is dehydration. Pain can also occur due to overexertion or exposure to extreme temperature changes.  Frequent severe bacterial infections, especially certain types of pneumonia and meningitis.  Pain and swelling in the hands and feet.  Decreased activity.   Loss of appetite.   Change in behavior.  Headaches.  Seizures.  Shortness of breath or difficulty breathing.  Vision changes.  Skin ulcers. Those with the trait may not have symptoms or they may have mild symptoms.  DIAGNOSIS  Sickle cell anemia is diagnosed with blood tests that demonstrate the genetic trait. It is often diagnosed during the newborn period, due to mandatory testing nationwide. A variety of blood tests, X-rays, CT scans, MRI scans,  ultrasounds, and lung function tests may also be done to monitor the condition. TREATMENT  Sickle cell anemia may be treated with:  Medicines. You may be given pain medicines, antibiotic medicines (to treat and prevent infections) or medicines to increase the production of certain types of hemoglobin.  Fluids.  Oxygen.  Blood transfusions. HOME CARE INSTRUCTIONS   Drink enough fluid to keep your urine clear or pale yellow. Increase your fluid intake in hot weather and during exercise.  Do not smoke. Smoking lowers oxygen levels in the blood.   Only take over-the-counter or prescription medicines for pain, fever, or discomfort as directed by your health care provider.  Take antibiotics as directed by your health care provider. Make sure you finish them it even if you start to feel better.   Take supplements as directed by your health care provider.   Consider wearing a medical alert bracelet. This tells anyone caring for you in an emergency of your condition.   When traveling, keep your medical information, health care provider's names, and the medicines you take with you at all times.   If you develop a fever, do not take medicines to reduce the fever right away. This could cover up a problem that is developing. Notify your health care provider.  Keep all follow-up appointments with your health care provider. Sickle cell anemia requires regular medical care. SEEK MEDICAL CARE IF: You have a fever. SEEK IMMEDIATE MEDICAL CARE IF:   You feel dizzy or faint.   You have new abdominal pain, especially on the left side near the stomach area.   You develop a  persistent, often uncomfortable and painful penile erection (priapism). If this is not treated immediately it will lead to impotence.   You have numbness your arms or legs or you have a hard time moving them.   You have a hard time with speech.   You have a fever or persistent symptoms for more than 2-3 days.    You have a fever and your symptoms suddenly get worse.   You have signs or symptoms of infection. These include:   Chills.   Abnormal tiredness (lethargy).   Irritability.   Poor eating.   Vomiting.   You develop pain that is not helped with medicine.   You develop shortness of breath.  You have pain in your chest.   You are coughing up pus-like or bloody sputum.   You develop a stiff neck.  Your feet or hands swell or have pain.  Your abdomen appears bloated.  You develop joint pain. MAKE SURE YOU:  Understand these instructions.   This information is not intended to replace advice given to you by your health care provider. Make sure you discuss any questions you have with your health care provider.   Document Released: 12/21/2005 Document Revised: 10/03/2014 Document Reviewed: 04/24/2013 Elsevier Interactive Patient Education 2016 Else[employer] Inc[employer] Hip Pain Your hip is the joint between your upper legs and your lower pelvis. The bones, cartilage, tendons, and muscles of your hip joint perform a lot of work each day supporting your body weight and allowing you to move around. Hip pain can range from a minor ache to severe pain in one or both of your hips. Pain may be felt on the inside of the hip joint near the groin, or the outside near the buttocks and upper thigh. You may have swelling or stiffness as well.  HOME CARE INSTRUCTIONS   Take medicines only as directed by your health care provider.  Apply ice to the injured area:  Put ice in a plastic bag.  Place a towel between your skin and the bag.  Leave the ice on for 15-20 minutes at a time, 3-4 times a day.  Keep your leg raised (elevated) when possible to lessen swelling.  Avoid activities that cause pain.  Follow specific exercises as directed by your health care provider.  Sleep with a pillow between your legs on your most comfortable side.  Record how often you have hip pain, the  location of the pain, and what it feels like. SEEK MEDICAL CARE IF:   You are unable to put weight on your leg.  Your hip is red or swollen or very tender to touch.  Your pain or swelling continues or worsens after 1 week.  You have increasing difficulty walking.  You have a fever. SEEK IMMEDIATE MEDICAL CARE IF:   You have fallen.  You have a sudden increase in pain and swelling in your hip. MAKE SURE YOU:   Understand these instructions.  Will watch your condition.  Will get help right away if you are not doing well or get worse.   This information is not intended to replace advice given to you by your health care provider. Make sure you discuss any questions you have with your health care provider.   Document Released: 03/02/2010 Document Revised: 10/03/2014 Document Reviewed: 05/09/2013 Elsevier Interactive Patient Education [employer]

## 2015-07-30 NOTE — Progress Notes (Signed)
Subjective:    Patient ID: Shawn Adams, male    DOB: 1976/01/27, 39 y.o.   MRN: [id number]   HPI [name], a 39 year old male with a history of sickle cell anemia, HbSS presents for a 1 month follow up.  Patient states that he has been having left hip pain since 2013. Pain is worsened when transitioning from sitting to standing and with increased activity.  He states that left hip pain is different from typical pain related to sickle cell anemia.  He states that he last had Percocet 5-325 mg on yesterday evening with moderate relief. He states that current pain intensity is 4/10 described as constant and aching. Patient denies fatigue, chest pains, nausea, vomiting, and diarrhea. He states that he is taking all medications consistently and hydrating.   Past Medical History  Diagnosis Date  . Sickle cell anemia (HCC)   . Aneurysm (HCC)     Pt states happened in 2011, Brain   . Heart murmur    Social History   Social History  . Marital Status: Single    Spouse Name: N/A  . Number of Children: N/A  . Years of Education: N/A   Occupational History  . Not on file.   Social History Main Topics  . Smoking status: Former Games developermoker  . Smokeless tobacco: Never Used  . Alcohol Use: No     Comment: occasionally  . Drug Use: No     Comment: Pt states uses 2 times a day  . Sexual Activity: Yes    Birth Control/ Protection: None   Other Topics Concern  . Not on file   Social History Narrative   Immunization History  Administered Date(s) Administered  . Influenza,inj,Quad PF,36+ Mos 06/21/2014, 07/08/2015  . Tdap 07/08/2015   Review of Systems  Constitutional: Positive for fatigue. Negative for fever.  HENT: Negative.   Eyes: Negative.   Respiratory: Negative.   Cardiovascular: Negative.   Gastrointestinal: Negative.  Negative for nausea and vomiting.  Endocrine: Negative.   Genitourinary: Negative.  Negative for hematuria, genital sores, penile pain and testicular pain.   Musculoskeletal: Positive for myalgias (left hip pain).  Skin: Negative.   Allergic/Immunologic: Negative.   Neurological: Negative.  Negative for dizziness, tremors, weakness, light-headedness and headaches.  Hematological: Negative.   Psychiatric/Behavioral: Negative.  Negative for suicidal ideas and sleep disturbance.      Objective:   Physical Exam  Constitutional: He is oriented to person, place, and time.  HENT:  Head: Normocephalic and atraumatic.  Right Ear: External ear normal.  Nose: Nose normal.  Mouth/Throat: Oropharynx is clear and moist.  Eyes: Conjunctivae and EOM are normal. Pupils are equal, round, and reactive to light.  Neck: Normal range of motion. Neck supple.  Cardiovascular: Normal rate, regular rhythm, normal heart sounds and intact distal pulses.   Pulmonary/Chest: Effort normal and breath sounds normal.  Abdominal: Soft. Bowel sounds are normal.  Musculoskeletal:       Left hip: He exhibits decreased range of motion and decreased strength. He exhibits no tenderness, no swelling and no deformity.  Neurological: He is alert and oriented to person, place, and time. He has normal reflexes.  Skin: Skin is warm and dry.  Psychiatric: He has a normal mood and affect. His behavior is normal. Judgment and thought content normal.      BP 121/63 mmHg  Pulse 87  Temp(Src) 98.8 F (37.1 C) (Oral)  Resp 16  Ht 6' (1.829 m)  Wt 134  lb (60.782 kg)  BMI 18.17 kg/m2  SpO2 97% Assessment & Plan:  1. Hb-SS disease without crisis [facility]) Patient is consistently taking medications. He is followed by hematologist at [facility]. He reports that he is taking hydroxyurea and folic acid consistently.   - POCT urinalysis dipstick - CBC with Differential  2. Left hip pain Patient sustained a fall, with minor injury several years ago. He states that he has intermittent pain to hip that is unrelieved by prolonged steroid use and regular. Will send for left  hip xray to rule out avascular necrosis.   3. Screen for STD (sexually transmitted disease) Patient reports that he has been having unprotected sexual intercourse. Will screen for STDs. Recommend that he utilize barrier protection with sexual intercourse.   - GC/Chlamydia Probe Amp - RPR  4. Other fatigue - CBC with Differential - HIV antibody (with reflex)  The patient was given clear instructions to go to ER or return to medical center if symptoms do not improve, worsen or new problems develop. The patient verbalized understanding. Will notify patient with laboratory results. Massie Maroon, FNP

## 2015-07-31 ENCOUNTER — Telehealth: Payer: Self-pay

## 2015-07-31 LAB — CBC WITH DIFFERENTIAL/PLATELET
BASOS PCT: 1 % (ref 0–1)
Basophils Absolute: 0.1 10*3/uL (ref 0.0–0.1)
Eosinophils Absolute: 0.2 10*3/uL (ref 0.0–0.7)
Eosinophils Relative: 2 % (ref 0–5)
HEMATOCRIT: 20.9 % — AB (ref 39.0–52.0)
HEMOGLOBIN: 7 g/dL — AB (ref 13.0–17.0)
LYMPHS ABS: 2.7 10*3/uL (ref 0.7–4.0)
Lymphocytes Relative: 36 % (ref 12–46)
MCH: 34.7 pg — ABNORMAL HIGH (ref 26.0–34.0)
MCHC: 33.5 g/dL (ref 30.0–36.0)
MCV: 103.5 fL — AB (ref 78.0–100.0)
MONO ABS: 0.7 10*3/uL (ref 0.1–1.0)
MONOS PCT: 9 % (ref 3–12)
MPV: 9.5 fL (ref 8.6–12.4)
NEUTROS ABS: 4 10*3/uL (ref 1.7–7.7)
Neutrophils Relative %: 52 % (ref 43–77)
Platelets: 420 10*3/uL — ABNORMAL HIGH (ref 150–400)
RBC: 2.02 MIL/uL — ABNORMAL LOW (ref 4.22–5.81)
RDW: 20.4 % — ABNORMAL HIGH (ref 11.5–15.5)
WBC: 7.6 10*3/uL (ref 4.0–10.5)

## 2015-07-31 LAB — RPR

## 2015-07-31 LAB — HIV ANTIBODY (ROUTINE TESTING W REFLEX): HIV: NONREACTIVE

## 2015-07-31 NOTE — Telephone Encounter (Signed)
-----   Message from Massie MaroonLachina M Hollis, [place] sent at 07/31/2015  8:08 AM EDT ----- Please inform patient that hemoglobin is consistent with baseline. All other labs are negative. Remind Mr. Carolin SicksMearite to use barrier protection with sexual intercourse.   Thanks  ----- Message -----    From: Lab in [facility]    Sent: 07/31/2015   2:18 AM      To: Massie MaroonLachina M Hollis, FNP

## 2015-07-31 NOTE — Telephone Encounter (Signed)
Spoke with patient and advised of consistent hemoglobin result and all other labs negative. Reminded patient to use protection with sexual intercourse. Thanks!

## 2015-08-01 LAB — GC/CHLAMYDIA PROBE AMP
CT PROBE, AMP APTIMA: NEGATIVE
GC PROBE AMP APTIMA: NEGATIVE

## 2015-08-03 DIAGNOSIS — M79605 Pain in left leg: Secondary | ICD-10-CM | POA: Diagnosis not present

## 2015-08-03 DIAGNOSIS — D571 Sickle-cell disease without crisis: Secondary | ICD-10-CM | POA: Diagnosis not present

## 2015-08-03 DIAGNOSIS — M791 Myalgia: Secondary | ICD-10-CM | POA: Diagnosis not present

## 2015-08-03 DIAGNOSIS — M25559 Pain in unspecified hip: Secondary | ICD-10-CM | POA: Diagnosis not present

## 2015-08-03 DIAGNOSIS — Z79891 Long term (current) use of opiate analgesic: Secondary | ICD-10-CM | POA: Diagnosis not present

## 2015-08-03 DIAGNOSIS — Z7952 Long term (current) use of systemic steroids: Secondary | ICD-10-CM | POA: Diagnosis not present

## 2015-08-03 DIAGNOSIS — Z79899 Other long term (current) drug therapy: Secondary | ICD-10-CM | POA: Diagnosis not present

## 2015-08-03 DIAGNOSIS — G8929 Other chronic pain: Secondary | ICD-10-CM | POA: Diagnosis not present

## 2015-08-06 DIAGNOSIS — I82541 Chronic embolism and thrombosis of right tibial vein: Secondary | ICD-10-CM | POA: Diagnosis not present

## 2015-08-18 ENCOUNTER — Telehealth: Payer: Self-pay

## 2015-08-18 NOTE — Telephone Encounter (Signed)
Medical record release was faxed in today 08/18/2015 and has been given to health port to release records. Thanks!

## 2015-10-01 ENCOUNTER — Encounter (HOSPITAL_COMMUNITY): Payer: Self-pay | Admitting: Emergency Medicine

## 2015-10-01 ENCOUNTER — Emergency Department (HOSPITAL_COMMUNITY)
Admission: EM | Admit: 2015-10-01 | Discharge: 2015-10-01 | Disposition: A | Discharge status: Home / Self Care | Payer: Medicare Other | Attending: Emergency Medicine | Admitting: Emergency Medicine

## 2015-10-01 DIAGNOSIS — Z79899 Other long term (current) drug therapy: Secondary | ICD-10-CM | POA: Insufficient documentation

## 2015-10-01 DIAGNOSIS — D5700 Hb-SS disease with crisis, unspecified: Secondary | ICD-10-CM | POA: Diagnosis present

## 2015-10-01 DIAGNOSIS — Z87891 Personal history of nicotine dependence: Secondary | ICD-10-CM | POA: Diagnosis not present

## 2015-10-01 DIAGNOSIS — Z7952 Long term (current) use of systemic steroids: Secondary | ICD-10-CM | POA: Diagnosis not present

## 2015-10-01 DIAGNOSIS — R011 Cardiac murmur, unspecified: Secondary | ICD-10-CM | POA: Insufficient documentation

## 2015-10-01 DIAGNOSIS — Z8679 Personal history of other diseases of the circulatory system: Secondary | ICD-10-CM | POA: Diagnosis not present

## 2015-10-01 LAB — COMPREHENSIVE METABOLIC PANEL
ALK PHOS: 95 U/L (ref 38–126)
ALT: 26 U/L (ref 17–63)
ANION GAP: 9 (ref 5–15)
AST: 55 U/L — ABNORMAL HIGH (ref 15–41)
Albumin: 4.2 g/dL (ref 3.5–5.0)
BILIRUBIN TOTAL: 3.1 mg/dL — AB (ref 0.3–1.2)
BUN: 16 mg/dL (ref 6–20)
CALCIUM: 9.3 mg/dL (ref 8.9–10.3)
CO2: 23 mmol/L (ref 22–32)
Chloride: 105 mmol/L (ref 101–111)
Creatinine, Ser: 1.55 mg/dL — ABNORMAL HIGH (ref 0.61–1.24)
GFR, EST NON AFRICAN AMERICAN: 55 mL/min — AB (ref >60)
Glucose, Bld: 94 mg/dL (ref 65–99)
Potassium: 4.6 mmol/L (ref 3.5–5.1)
SODIUM: 137 mmol/L (ref 135–145)
TOTAL PROTEIN: 7.3 g/dL (ref 6.5–8.1)

## 2015-10-01 LAB — RETICULOCYTES: RBC.: 1.71 MIL/uL — AB (ref 4.22–5.81)

## 2015-10-01 LAB — PROTIME-INR
INR: 1.09 (ref 0.00–1.49)
PROTHROMBIN TIME: 14.3 s (ref 11.6–15.2)

## 2015-10-01 MED ORDER — SODIUM CHLORIDE 0.45 % IV SOLN
INTRAVENOUS | Status: DC
  Administered 2015-10-01 (×2): via INTRAVENOUS

## 2015-10-01 MED ORDER — HYDROMORPHONE HCL 2 MG/ML IJ SOLN
0.0250 mg/kg | INTRAMUSCULAR | Status: AC
  Administered 2015-10-01: 1.5 mg via INTRAVENOUS
  Filled 2015-10-01: qty 1

## 2015-10-01 MED ORDER — HYDROMORPHONE HCL 2 MG/ML IJ SOLN
0.0250 mg/kg | INTRAMUSCULAR | Status: AC
  Filled 2015-10-01: qty 1

## 2015-10-01 MED ORDER — ONDANSETRON HCL 4 MG/2ML IJ SOLN
4.0000 mg | INTRAMUSCULAR | Status: DC | PRN
Start: 2015-10-01 — End: 2015-10-01
  Filled 2015-10-01: qty 2

## 2015-10-01 MED ORDER — DIPHENHYDRAMINE HCL 25 MG PO CAPS: 25.0000 mg | ORAL_CAPSULE | ORAL | Status: DC | PRN

## 2015-10-01 NOTE — ED Provider Notes (Signed)
CSN: [id number]     Arrival date & time 10/01/15  0808 History   First MD Initiated Contact with Patient 10/01/15 [phone]     Chief Complaint  Patient presents with  . Sickle Cell Pain Crisis   HPI Patient presents to the emergency room for evaluation of sickle cell pain. Patient feels he is having a crisis. He is having pain in his back and extremities. Symptoms started about 3 hours ago. Pain is very similar to prior episodes. He was at work today so he did not have any his medications with him. The sickle cell center and was told to come to the emergency room. Any trouble with chest pain or shortness of breath. No fevers. No abdominal pain. No other complaints. Past Medical History  Diagnosis Date  . Sickle cell anemia (HCC)   . Aneurysm (HCC)     Pt states happened in 2011, Brain   . Heart murmur    Past Surgical History  Procedure Laterality Date  . Lymph node biopsy     Family History  Problem Relation Age of Onset  . Diabetes Mother    Social History  Substance Use Topics  . Smoking status: Former Games developermoker  . Smokeless tobacco: Never Used  . Alcohol Use: No     Comment: occasionally    Review of Systems  All other systems reviewed and are negative.     Allergies  Review of patient's allergies indicates no known allergies.  Home Medications   Prior to Admission medications   Medication Sig Start Date End Date Taking? Authorizing Provider  Cholecalciferol (VITAMIN D) 2000 UNITS CAPS Take 1 capsule (2,000 Units total) by mouth daily. 01/29/15   Altha HarmMichelle A Matthews, MD  famotidine (PEPCID) 20 MG tablet Take 20 mg by mouth daily.     Historical Provider, MD  folic acid (FOLVITE) 1 MG tablet Take 1 mg by mouth daily.     Historical Provider, MD  hydroxyurea (HYDREA) 500 MG capsule Take 1,000 mg by mouth daily. May take with food to minimize GI side effects.    Historical Provider, MD  ibuprofen (ADVIL,MOTRIN) 800 MG tablet Take 1 tablet (800 mg total) by mouth 3 (three) times  daily. Patient not taking: Reported on 07/30/2015 07/06/15   Mady GemmaElizabeth C Westfall, PA-C  oxyCODONE-acetaminophen (PERCOCET) 5-325 MG per tablet Take 1 tablet by mouth every 4 (four) hours as needed for moderate pain.     Historical Provider, MD  predniSONE (DELTASONE) 10 MG tablet Take 10 mg by mouth daily with breakfast.    Historical Provider, MD   BP 137/71 mmHg  Pulse 65  Temp(Src) 97.6 F (36.4 C) (Oral)  Resp 20  Ht 6' (1.829 m)  Wt 61.236 kg  BMI 18.31 kg/m2  SpO2 94% Physical Exam  Constitutional: He appears well-developed and well-nourished. No distress.  HENT:  Head: Normocephalic and atraumatic.  Right Ear: External ear normal.  Left Ear: External ear normal.  Eyes: Conjunctivae are normal. Right eye exhibits no discharge. Left eye exhibits no discharge. No scleral icterus.  Neck: Neck supple. No tracheal deviation present.  Cardiovascular: Normal rate, regular rhythm and intact distal pulses.   Pulmonary/Chest: Effort normal and breath sounds normal. No stridor. No respiratory distress. He has no wheezes. He has no rales.  Abdominal: Soft. Bowel sounds are normal. He exhibits no distension. There is no tenderness. There is no rebound and no guarding.  Musculoskeletal: He exhibits no edema or tenderness.  Neurological: He is alert. He has  normal strength. No cranial nerve deficit (no facial droop, extraocular movements intact, no slurred speech) or sensory deficit. He exhibits normal muscle tone. He displays no seizure activity. Coordination normal.  Skin: Skin is warm and dry. No rash noted.  Psychiatric: He has a normal mood and affect.  Nursing note and vitals reviewed.   ED Course  Procedures (including critical care time) Labs Review Labs Reviewed  COMPREHENSIVE METABOLIC PANEL - Abnormal; Notable for the following:    Creatinine, Ser 1.55 (*)    AST 55 (*)    Total Bilirubin 3.1 (*)    GFR calc non Af Amer 55 (*)    All other components within normal limits   CBC WITH DIFFERENTIAL/PLATELET - Abnormal; Notable for the following:    RBC 1.77 (*)    Hemoglobin 6.0 (*)    HCT 16.2 (*)    MCHC 37.0 (*)    RDW 24.9 (*)    All other components within normal limits  RETICULOCYTES - Abnormal; Notable for the following:    Retic Ct Pct >23.0 (*)    RBC. 1.71 (*)    All other components within normal limits  PROTIME-INR  TYPE AND SCREEN   Medications  0.45 % sodium chloride infusion ( Intravenous New Bag/Given 10/01/15 1006)  diphenhydrAMINE (BENADRYL) capsule 25-50 mg (not administered)  ondansetron (ZOFRAN) injection 4 mg (not administered)  HYDROmorphone (DILAUDID) injection 1.5 mg (1.5 mg Intravenous Given 10/01/15 1005)    Or  HYDROmorphone (DILAUDID) injection 1.5 mg ( Subcutaneous See Alternative 10/01/15 1005)     MDM   Final diagnoses:  Hb-SS disease with crisis [facility])    Patient presents to the emergency room with a sickle cell crisis. Patient was given 1 dose of pain medications and his symptoms have improved significantly. He is feeling much better now. Patient is comfortable and would like to go home.  Since hemoglobin is down to 6.0. Patient states at baseline he is anemic. Usually he does not get transfusions until his hemoglobin is less than 5 or 5.5.  Discussed outpatient follow up, warning signs and precautions.    Linwood Dibbles, MD 10/01/15 [phone]

## 2015-10-01 NOTE — ED Notes (Signed)
Per pt states back pain which started 3 hours ago-states home meds help but didn't take today

## 2015-10-01 NOTE — Discharge Instructions (Signed)
Sickle Cell Anemia, Adult Sickle cell anemia is a condition in which red blood cells have an abnormal "sickle" shape. This abnormal shape shortens the cells' life span, which results in a lower than normal concentration of red blood cells in the blood. The sickle shape also causes the cells to clump together and block free blood flow through the blood vessels. As a result, the tissues and organs of the body do not receive enough oxygen. Sickle cell anemia causes organ damage and pain and increases the risk of infection. CAUSES  Sickle cell anemia is a genetic disorder. Those who receive two copies of the gene have the condition, and those who receive one copy have the trait. RISK FACTORS The sickle cell gene is most common in people whose families originated in [place]. Other areas of the globe where sickle cell trait occurs include the Mediterranean, [place] and [place], the [place], and the [place].  SIGNS AND SYMPTOMS  Pain, especially in the extremities, back, chest, or abdomen (common). The pain may start suddenly or may develop following an illness, especially if there is dehydration. Pain can also occur due to overexertion or exposure to extreme temperature changes.  Frequent severe bacterial infections, especially certain types of pneumonia and meningitis.  Pain and swelling in the hands and feet.  Decreased activity.   Loss of appetite.   Change in behavior.  Headaches.  Seizures.  Shortness of breath or difficulty breathing.  Vision changes.  Skin ulcers. Those with the trait may not have symptoms or they may have mild symptoms.  DIAGNOSIS  Sickle cell anemia is diagnosed with blood tests that demonstrate the genetic trait. It is often diagnosed during the newborn period, due to mandatory testing nationwide. A variety of blood tests, X-rays, CT scans, MRI scans, ultrasounds, and lung function tests may also be done to monitor the condition. TREATMENT  Sickle  cell anemia may be treated with:  Medicines. You may be given pain medicines, antibiotic medicines (to treat and prevent infections) or medicines to increase the production of certain types of hemoglobin.  Fluids.  Oxygen.  Blood transfusions. HOME CARE INSTRUCTIONS   Drink enough fluid to keep your urine clear or pale yellow. Increase your fluid intake in hot weather and during exercise.  Do not smoke. Smoking lowers oxygen levels in the blood.   Only take over-the-counter or prescription medicines for pain, fever, or discomfort as directed by your health care provider.  Take antibiotics as directed by your health care provider. Make sure you finish them it even if you start to feel better.   Take supplements as directed by your health care provider.   Consider wearing a medical alert bracelet. This tells anyone caring for you in an emergency of your condition.   When traveling, keep your medical information, health care provider's names, and the medicines you take with you at all times.   If you develop a fever, do not take medicines to reduce the fever right away. This could cover up a problem that is developing. Notify your health care provider.  Keep all follow-up appointments with your health care provider. Sickle cell anemia requires regular medical care. SEEK MEDICAL CARE IF: You have a fever. SEEK IMMEDIATE MEDICAL CARE IF:   You feel dizzy or faint.   You have new abdominal pain, especially on the left side near the stomach area.   You develop a persistent, often uncomfortable and painful penile erection (priapism). If this is not treated immediately it   will lead to impotence.   You have numbness your arms or legs or you have a hard time moving them.   You have a hard time with speech.   You have a fever or persistent symptoms for more than 2-3 days.   You have a fever and your symptoms suddenly get worse.   You have signs or symptoms of infection.  These include:   Chills.   Abnormal tiredness (lethargy).   Irritability.   Poor eating.   Vomiting.   You develop pain that is not helped with medicine.   You develop shortness of breath.  You have pain in your chest.   You are coughing up pus-like or bloody sputum.   You develop a stiff neck.  Your feet or hands swell or have pain.  Your abdomen appears bloated.  You develop joint pain. MAKE SURE YOU:  Understand these instructions.   This information is not intended to replace advice given to you by your health care provider. Make sure you discuss any questions you have with your health care provider.   Document Released: 12/21/2005 Document Revised: 10/03/2014 Document Reviewed: 04/24/2013 Elsevier Interactive Patient Education 2016 [employer] Inc[employer]  

## 2015-10-02 LAB — CBC WITH DIFFERENTIAL/PLATELET
BASOS ABS: 0.1 10*3/uL (ref 0.0–0.1)
Basophils Relative: 1 %
EOS ABS: 0.4 10*3/uL (ref 0.0–0.7)
Eosinophils Relative: 4 %
HCT: 16.2 % — ABNORMAL LOW (ref 39.0–52.0)
HEMOGLOBIN: 6 g/dL — AB (ref 13.0–17.0)
LYMPHS PCT: 31 %
Lymphs Abs: 2.9 10*3/uL (ref 0.7–4.0)
MCH: 33.9 pg (ref 26.0–34.0)
MCHC: 37 g/dL — ABNORMAL HIGH (ref 30.0–36.0)
MCV: 91.5 fL (ref 78.0–100.0)
MONO ABS: 0.8 10*3/uL (ref 0.1–1.0)
Monocytes Relative: 9 %
NEUTROS PCT: 55 %
Neutro Abs: 5.1 10*3/uL (ref 1.7–7.7)
PLATELETS: 400 10*3/uL (ref 150–400)
RBC: 1.77 MIL/uL — AB (ref 4.22–5.81)
RDW: 24.9 % — ABNORMAL HIGH (ref 11.5–15.5)
WBC: 9.3 10*3/uL (ref 4.0–10.5)

## 2015-10-05 LAB — TYPE AND SCREEN
ABO/RH(D): B POS
ANTIBODY SCREEN: NEGATIVE
DAT, IgG: NEGATIVE
Unit division: 0
Unit division: 0

## 2015-10-13 ENCOUNTER — Ambulatory Visit (INDEPENDENT_AMBULATORY_CARE_PROVIDER_SITE_OTHER): Payer: Medicare Other | Admitting: Family Medicine

## 2015-10-13 VITALS — BP 122/76 | HR 77 | Temp 97.5°F | Resp 16 | Ht 72.0 in | Wt 136.0 lb

## 2015-10-13 DIAGNOSIS — N183 Chronic kidney disease, stage 3 unspecified: Secondary | ICD-10-CM

## 2015-10-13 DIAGNOSIS — R519 Headache, unspecified: Secondary | ICD-10-CM

## 2015-10-13 DIAGNOSIS — R51 Headache: Secondary | ICD-10-CM | POA: Diagnosis not present

## 2015-10-13 DIAGNOSIS — Z8679 Personal history of other diseases of the circulatory system: Secondary | ICD-10-CM | POA: Diagnosis not present

## 2015-10-13 DIAGNOSIS — D571 Sickle-cell disease without crisis: Secondary | ICD-10-CM

## 2015-10-13 LAB — POCT URINALYSIS DIP (DEVICE)
Bilirubin Urine: NEGATIVE
GLUCOSE, UA: NEGATIVE mg/dL
KETONES UR: NEGATIVE mg/dL
Nitrite: NEGATIVE
Protein, ur: 30 mg/dL — AB
SPECIFIC GRAVITY, URINE: 1.01 (ref 1.005–1.030)
Urobilinogen, UA: 0.2 mg/dL (ref 0.0–1.0)
pH: 6 (ref 5.0–8.0)

## 2015-10-13 LAB — CBC WITH DIFFERENTIAL/PLATELET
BASOS ABS: 0.1 10*3/uL (ref 0.0–0.1)
BASOS PCT: 1 % (ref 0–1)
EOS ABS: 0.3 10*3/uL (ref 0.0–0.7)
Eosinophils Relative: 3 % (ref 0–5)
HCT: 17.3 % — ABNORMAL LOW (ref 39.0–52.0)
HEMOGLOBIN: 6.1 g/dL — AB (ref 13.0–17.0)
LYMPHS ABS: 3.1 10*3/uL (ref 0.7–4.0)
Lymphocytes Relative: 30 % (ref 12–46)
MCH: 33 pg (ref 26.0–34.0)
MCHC: 35.3 g/dL (ref 30.0–36.0)
MCV: 93.5 fL (ref 78.0–100.0)
MONOS PCT: 9 % (ref 3–12)
MPV: 9.6 fL (ref 8.6–12.4)
Monocytes Absolute: 0.9 10*3/uL (ref 0.1–1.0)
NEUTROS ABS: 5.9 10*3/uL (ref 1.7–7.7)
NEUTROS PCT: 57 % (ref 43–77)
PLATELETS: 457 10*3/uL — AB (ref 150–400)
RBC: 1.85 MIL/uL — ABNORMAL LOW (ref 4.22–5.81)
RDW: 22.4 % — ABNORMAL HIGH (ref 11.5–15.5)
WBC: 10.4 10*3/uL (ref 4.0–10.5)

## 2015-10-13 NOTE — Patient Instructions (Addendum)
Will follow up by phone with laboratory results.  3 month follow up with Dr. Hyman Hopes for sickle cell Sent referral to neurologist for history of aneurysm Sent referral to opthalmologist for yearly evaluation to r/o sickle cell retinopathy Recommend that patient follow-up with nephrologist, Dr. Sharla Kidney for CKD.

## 2015-10-13 NOTE — Progress Notes (Signed)
Subjective:    Patient ID: Shawn Adams, male    DOB: 18-Jul-1976, 40 y.o.   MRN: [id number]   HPI [name], a 40 year old male with a history of sickle cell anemia, HbSS and stage III CKD presents for a 3 month follow up. Patient is currently complaining of an occipital headache. Patient has a history of an aneurysm in 2011 and cluster headaches. Current pain intensity is 3/10, he states that he took Tylenol 500 mg for headache pain on yesterday with moderate relief. Patient describes the headaches and intermittent. He states that he is concerned due to history of aneurysm. He states that he has not followed with neurology at [facility] in several years.  The headache is not related to a particular time of day.   The headaches are usually poorly described and are occipital in location.  The patient rates his most severe headaches a 6 on a scale from 1 to 10. Recently, the headaches are decreasing in severity, but increasing in frequency.  The patient denies depression, dizziness, loss of balance, muscle weakness, numbness of extremities, vision problems and vomiting in the early morning.   Past Medical History  Diagnosis Date  . Sickle cell anemia (HCC)   . Aneurysm (HCC)     Pt states happened in 2011, Brain   . Heart murmur    Social History   Social History  . Marital Status: Single    Spouse Name: N/A  . Number of Children: N/A  . Years of Education: N/A   Occupational History  . Not on file.   Social History Main Topics  . Smoking status: Former Games developer  . Smokeless tobacco: Never Used  . Alcohol Use: No     Comment: occasionally  . Drug Use: No     Comment: Pt states uses 2 times a day  . Sexual Activity: Yes    Birth Control/ Protection: None   Other Topics Concern  . Not on file   Social History Narrative   Immunization History  Administered Date(s) Administered  . Influenza,inj,Quad PF,36+ Mos 06/21/2014, 07/08/2015  . Tdap  07/08/2015   Review of Systems  Constitutional: Negative for fever, fatigue and unexpected weight change.  HENT: Negative for sinus pressure.   Eyes: Negative.  Negative for visual disturbance.  Respiratory: Negative.  Negative for chest tightness and shortness of breath.   Cardiovascular: Negative.   Gastrointestinal: Negative.  Negative for nausea and vomiting.  Endocrine: Negative.  Negative for polydipsia, polyphagia and polyuria.  Genitourinary: Negative.  Negative for hematuria, genital sores, penile pain and testicular pain.  Musculoskeletal: Positive for myalgias (low back pain).  Skin: Negative.  Negative for rash and wound.  Allergic/Immunologic: Negative.   Neurological: Positive for headaches. Negative for dizziness, tremors and weakness.  Hematological: Negative.   Psychiatric/Behavioral: Negative.  Negative for suicidal ideas and sleep disturbance.      Objective:   Physical Exam  Constitutional: He is oriented to person, place, and time.  HENT:  Head: Normocephalic and atraumatic.  Right Ear: External ear normal.  Nose: Nose normal.  Mouth/Throat: Oropharynx is clear and moist.  Eyes: Conjunctivae and EOM are normal. Pupils are equal, round, and reactive to light.  Neck: Normal range of motion. Neck supple.  Cardiovascular: Normal rate, regular rhythm, normal heart sounds and intact distal pulses.   Pulmonary/Chest: Effort normal and breath sounds normal.  Abdominal: Soft. Bowel sounds are normal.  Neurological: He is alert and oriented  to person, place, and time. He has normal strength and normal reflexes. No cranial nerve deficit or sensory deficit. Coordination and gait normal.  Skin: Skin is warm and dry.  Psychiatric: He has a normal mood and affect. His behavior is normal. Judgment and thought content normal.      BP 122/76 mmHg  Pulse 77  Temp(Src) 97.5 F (36.4 C) (Oral)  Resp 16  Ht 6' (1.829 m)  Wt 136 lb (61.689 kg)  BMI 18.44 kg/m2  SpO2  96% Assessment & Plan:    1. Hb-SS disease without crisis [facility]) Patient is under the care of Newman Pies, hematology. Will continue hydroxyurea as prescribed. We discussed the need for good hydration, monitoring of hydration status, avoidance of heat, cold, stress, and infection triggers. We discussed the risks and benefits of Hydrea, including bone marrow suppression, the possibility of GI upset, skin ulcers, hair thinning, and teratogenicity. The patient was reminded of the need to seek medical attention of any symptoms of bleeding, anemia, or infection. Continue folic acid 1 mg daily to prevent aplastic bone marrow crises.   Pulmonary evaluation - Patient denies severe recurrent wheezes, shortness of breath with exercise, or persistent cough. If these symptoms develop, pulmonary function tests with spirometry will be ordered, and if abnormal, plan on referral to Pulmonology for further evaluation.  Cardiac - Routine screening for pulmonary hypertension is not recommended.  Eye - High risk of proliferative retinopathy. Annual eye exam with retinal exam recommended to patient. Will send referral to opthalmology  Renal: Proteinuria present, Stage III CKG, recommend scheduling follow-up with Dr. Pamalee Leyden.  Immunization status - Up to date with vaccinations  - POCT urinalysis dipstick - COMPLETE METABOLIC PANEL WITH GFR - CBC with Differential - Ambulatory referral to Ophthalmology  2. History of aneurysm Patient is concerned of a periodic occipital headache. He has a history of an aneurysm in 2011. Patient was under the care of Dr. Weston Settle, neurology at [facility]. He maintains that he has not seen Dr. Weston Settle since 2013. He is requesting a referral to a local neurologist due to transportation constraints.  - Ambulatory referral to Neurology  3. Recurrent occipital headache Patient experienced moderate relief with Tylenol 500 mg, recommend that he continue every 6 hours as  needed. Will send a referral to neurology due to hx of aneurysm and SCA - Ambulatory referral to Neurology  4. CKD (chronic kidney disease) stage 3, GFR 30-59 ml/min Patient is followed by Dr. Jean Rosenthal, nephrologist, [facility]. Reviewed previous notes. Recommend that patient follow up with nephrology.  - POCT urinalysis dip (device)    The patient was given clear instructions to go to ER or return to medical center if symptoms do not improve, worsen or new problems develop. The patient verbalized understanding. Will notify patient with laboratory results. Massie Maroon, FNP

## 2015-10-14 LAB — COMPLETE METABOLIC PANEL WITH GFR
ALK PHOS: 83 U/L (ref 40–115)
ALT: 21 U/L (ref 9–46)
AST: 51 U/L — ABNORMAL HIGH (ref 10–40)
Albumin: 3.9 g/dL (ref 3.6–5.1)
BILIRUBIN TOTAL: 3.3 mg/dL — AB (ref 0.2–1.2)
BUN: 13 mg/dL (ref 7–25)
CO2: 20 mmol/L (ref 20–31)
Calcium: 9 mg/dL (ref 8.6–10.3)
Chloride: 109 mmol/L (ref 98–110)
Creat: 1.35 mg/dL (ref 0.60–1.35)
GFR, EST AFRICAN AMERICAN: 76 mL/min (ref >=60)
GFR, EST NON AFRICAN AMERICAN: 66 mL/min (ref >=60)
GLUCOSE: 79 mg/dL (ref 65–99)
POTASSIUM: 4.6 mmol/L (ref 3.5–5.3)
SODIUM: 139 mmol/L (ref 135–146)
TOTAL PROTEIN: 6.7 g/dL (ref 6.1–8.1)

## 2015-10-15 ENCOUNTER — Encounter: Payer: Self-pay | Admitting: Family Medicine

## 2015-10-15 DIAGNOSIS — R519 Headache, unspecified: Secondary | ICD-10-CM | POA: Insufficient documentation

## 2015-10-15 DIAGNOSIS — R51 Headache: Secondary | ICD-10-CM

## 2015-11-06 ENCOUNTER — Ambulatory Visit: Payer: Medicare Other | Admitting: Neurology

## 2015-12-07 ENCOUNTER — Emergency Department (HOSPITAL_COMMUNITY)
Admission: EM | Admit: 2015-12-07 | Discharge: 2015-12-07 | Disposition: A | Discharge status: Home / Self Care | Payer: Medicare Other | Source: Home / Self Care

## 2015-12-07 ENCOUNTER — Encounter (HOSPITAL_COMMUNITY): Payer: Self-pay | Admitting: Emergency Medicine

## 2015-12-07 ENCOUNTER — Inpatient Hospital Stay (HOSPITAL_COMMUNITY)
Admission: EM | Admit: 2015-12-07 | Discharge: 2015-12-10 | DRG: 812 | Disposition: A | Discharge status: Home / Self Care | Payer: Medicare Other | Attending: Internal Medicine | Admitting: Internal Medicine

## 2015-12-07 DIAGNOSIS — Z7952 Long term (current) use of systemic steroids: Secondary | ICD-10-CM

## 2015-12-07 DIAGNOSIS — K5909 Other constipation: Secondary | ICD-10-CM | POA: Diagnosis not present

## 2015-12-07 DIAGNOSIS — N183 Chronic kidney disease, stage 3 (moderate): Secondary | ICD-10-CM | POA: Diagnosis present

## 2015-12-07 DIAGNOSIS — K5900 Constipation, unspecified: Secondary | ICD-10-CM | POA: Diagnosis present

## 2015-12-07 DIAGNOSIS — D869 Sarcoidosis, unspecified: Secondary | ICD-10-CM | POA: Diagnosis present

## 2015-12-07 DIAGNOSIS — D571 Sickle-cell disease without crisis: Secondary | ICD-10-CM | POA: Diagnosis present

## 2015-12-07 DIAGNOSIS — D599 Acquired hemolytic anemia, unspecified: Secondary | ICD-10-CM | POA: Diagnosis present

## 2015-12-07 DIAGNOSIS — D5700 Hb-SS disease with crisis, unspecified: Secondary | ICD-10-CM

## 2015-12-07 DIAGNOSIS — D638 Anemia in other chronic diseases classified elsewhere: Secondary | ICD-10-CM | POA: Diagnosis present

## 2015-12-07 DIAGNOSIS — D649 Anemia, unspecified: Secondary | ICD-10-CM | POA: Diagnosis not present

## 2015-12-07 DIAGNOSIS — R0902 Hypoxemia: Secondary | ICD-10-CM | POA: Diagnosis present

## 2015-12-07 LAB — CBC WITH DIFFERENTIAL/PLATELET
BAND NEUTROPHILS: 0 %
BASOS PCT: 0 %
Basophils Absolute: 0 10*3/uL (ref 0.0–0.1)
Blasts: 0 %
Eosinophils Absolute: 0.3 10*3/uL (ref 0.0–0.7)
Eosinophils Relative: 2 %
HCT: 14.5 % — ABNORMAL LOW (ref 39.0–52.0)
Hemoglobin: 5.3 g/dL — CL (ref 13.0–17.0)
LYMPHS ABS: 5 10*3/uL — AB (ref 0.7–4.0)
LYMPHS PCT: 36 %
MCH: 31 pg (ref 26.0–34.0)
MCHC: 36.6 g/dL — AB (ref 30.0–36.0)
MCV: 84.8 fL (ref 78.0–100.0)
MONO ABS: 1 10*3/uL (ref 0.1–1.0)
MONOS PCT: 7 %
Metamyelocytes Relative: 0 %
Myelocytes: 3 %
NEUTROS ABS: 7.5 10*3/uL (ref 1.7–7.7)
NEUTROS PCT: 52 %
NRBC: 0 /100{WBCs}
OTHER: 0 %
PLATELETS: 343 10*3/uL (ref 150–400)
PROMYELOCYTES ABS: 0 %
RBC: 1.71 MIL/uL — ABNORMAL LOW (ref 4.22–5.81)
RDW: 24.8 % — ABNORMAL HIGH (ref 11.5–15.5)
WBC: 13.8 10*3/uL — ABNORMAL HIGH (ref 4.0–10.5)

## 2015-12-07 LAB — COMPREHENSIVE METABOLIC PANEL
ALBUMIN: 3.7 g/dL (ref 3.5–5.0)
ALT: 39 U/L (ref 17–63)
ANION GAP: 9 (ref 5–15)
AST: 82 U/L — ABNORMAL HIGH (ref 15–41)
Alkaline Phosphatase: 129 U/L — ABNORMAL HIGH (ref 38–126)
BUN: 15 mg/dL (ref 6–20)
CHLORIDE: 113 mmol/L — AB (ref 101–111)
CO2: 18 mmol/L — AB (ref 22–32)
Calcium: 9.4 mg/dL (ref 8.9–10.3)
Creatinine, Ser: 1.41 mg/dL — ABNORMAL HIGH (ref 0.61–1.24)
GFR calc non Af Amer: >60 mL/min (ref >60)
GLUCOSE: 108 mg/dL — AB (ref 65–99)
POTASSIUM: 4.3 mmol/L (ref 3.5–5.1)
SODIUM: 140 mmol/L (ref 135–145)
Total Bilirubin: 3.5 mg/dL — ABNORMAL HIGH (ref 0.3–1.2)
Total Protein: 7.2 g/dL (ref 6.5–8.1)

## 2015-12-07 LAB — RETICULOCYTES
RBC.: 1.77 MIL/uL — ABNORMAL LOW (ref 4.22–5.81)
Retic Count, Absolute: 396.5 10*3/uL — ABNORMAL HIGH (ref 19.0–186.0)
Retic Ct Pct: 22.4 % — ABNORMAL HIGH (ref 0.4–3.1)

## 2015-12-07 LAB — PREPARE RBC (CROSSMATCH)

## 2015-12-07 LAB — LACTATE DEHYDROGENASE: LDH: 708 U/L — ABNORMAL HIGH (ref 98–192)

## 2015-12-07 MED ORDER — DIPHENHYDRAMINE HCL 25 MG PO CAPS: 25.0000 mg | ORAL_CAPSULE | Freq: Four times a day (QID) | ORAL | Status: DC | PRN

## 2015-12-07 MED ORDER — PREDNISONE 20 MG PO TABS
20.0000 mg | ORAL_TABLET | Freq: Every day | ORAL | Status: DC
  Administered 2015-12-08 – 2015-12-10 (×3): 20 mg via ORAL
  Filled 2015-12-07 (×6): qty 1

## 2015-12-07 MED ORDER — SODIUM CHLORIDE 0.9% FLUSH: 9.0000 mL | INTRAVENOUS | Status: DC | PRN

## 2015-12-07 MED ORDER — DIPHENHYDRAMINE HCL 25 MG PO CAPS: 25.0000 mg | ORAL_CAPSULE | ORAL | Status: DC | PRN

## 2015-12-07 MED ORDER — FOLIC ACID 1 MG PO TABS
1.0000 mg | ORAL_TABLET | Freq: Every day | ORAL | Status: DC
  Administered 2015-12-07 – 2015-12-10 (×4): 1 mg via ORAL
  Filled 2015-12-07 (×4): qty 1

## 2015-12-07 MED ORDER — SODIUM CHLORIDE 0.45 % IV SOLN
INTRAVENOUS | Status: DC
  Administered 2015-12-07 – 2015-12-10 (×5): via INTRAVENOUS

## 2015-12-07 MED ORDER — HYDROMORPHONE HCL 2 MG/ML IJ SOLN: 0.0313 mg/kg | INTRAMUSCULAR | Status: AC

## 2015-12-07 MED ORDER — NALOXONE HCL 0.4 MG/ML IJ SOLN: 0.4000 mg | INTRAMUSCULAR | Status: DC | PRN

## 2015-12-07 MED ORDER — DIPHENHYDRAMINE HCL 50 MG/ML IJ SOLN
25.0000 mg | Freq: Once | INTRAMUSCULAR | Status: AC
  Administered 2015-12-07: 25 mg via INTRAVENOUS
  Filled 2015-12-07: qty 1

## 2015-12-07 MED ORDER — SODIUM CHLORIDE 0.9 % IV SOLN
Freq: Once | INTRAVENOUS | Status: AC
  Administered 2015-12-07: 16:00:00 via INTRAVENOUS

## 2015-12-07 MED ORDER — VITAMIN D3 25 MCG (1000 UNIT) PO TABS
2000.0000 [IU] | ORAL_TABLET | Freq: Every day | ORAL | Status: DC
  Administered 2015-12-07 – 2015-12-10 (×4): 2000 [IU] via ORAL
  Filled 2015-12-07 (×4): qty 2

## 2015-12-07 MED ORDER — HYDROMORPHONE HCL 2 MG/ML IJ SOLN
0.0313 mg/kg | INTRAMUSCULAR | Status: AC
  Administered 2015-12-07: 2 mg via INTRAVENOUS
  Filled 2015-12-07: qty 1

## 2015-12-07 MED ORDER — HYDROMORPHONE 1 MG/ML IV SOLN
INTRAVENOUS | Status: DC
  Administered 2015-12-07: 18:00:00 via INTRAVENOUS
  Administered 2015-12-08 (×2): 0.8 mg via INTRAVENOUS
  Administered 2015-12-08 – 2015-12-09 (×2): 1 mg via INTRAVENOUS
  Administered 2015-12-09: 2.4 mg via INTRAVENOUS
  Administered 2015-12-09: 0.4 mg via INTRAVENOUS
  Administered 2015-12-09: 0.8 mg via INTRAVENOUS
  Administered 2015-12-09: 1.2 mg via INTRAVENOUS
  Administered 2015-12-10: 0.4 mg via INTRAVENOUS
  Administered 2015-12-10: 1.6 mg via INTRAVENOUS
  Filled 2015-12-07: qty 25

## 2015-12-07 MED ORDER — ENOXAPARIN SODIUM 40 MG/0.4ML ~~LOC~~ SOLN
40.0000 mg | SUBCUTANEOUS | Status: DC
  Administered 2015-12-09 – 2015-12-10 (×2): 40 mg via SUBCUTANEOUS
  Filled 2015-12-07 (×4): qty 0.4

## 2015-12-07 MED ORDER — FAMOTIDINE 20 MG PO TABS
20.0000 mg | ORAL_TABLET | Freq: Every day | ORAL | Status: DC
  Administered 2015-12-07 – 2015-12-10 (×4): 20 mg via ORAL
  Filled 2015-12-07 (×4): qty 1

## 2015-12-07 MED ORDER — POLYETHYLENE GLYCOL 3350 17 G PO PACK
17.0000 g | PACK | Freq: Every day | ORAL | Status: DC | PRN
  Administered 2015-12-09 – 2015-12-10 (×2): 17 g via ORAL
  Filled 2015-12-07 (×2): qty 1

## 2015-12-07 MED ORDER — ONDANSETRON HCL 4 MG/2ML IJ SOLN
4.0000 mg | INTRAMUSCULAR | Status: DC | PRN
  Administered 2015-12-07: 4 mg via INTRAVENOUS
  Filled 2015-12-07: qty 2

## 2015-12-07 MED ORDER — SODIUM CHLORIDE 0.45 % IV SOLN
INTRAVENOUS | Status: DC
  Administered 2015-12-07: 150 mL/h via INTRAVENOUS

## 2015-12-07 MED ORDER — HYDROMORPHONE HCL 2 MG/ML IJ SOLN
0.0250 mg/kg | INTRAMUSCULAR | Status: AC
  Administered 2015-12-07: 1.6 mg via INTRAVENOUS
  Filled 2015-12-07: qty 1

## 2015-12-07 MED ORDER — SENNOSIDES-DOCUSATE SODIUM 8.6-50 MG PO TABS
1.0000 | ORAL_TABLET | Freq: Two times a day (BID) | ORAL | Status: DC
  Administered 2015-12-08 – 2015-12-10 (×3): 1 via ORAL
  Filled 2015-12-07 (×6): qty 1

## 2015-12-07 MED ORDER — KETOROLAC TROMETHAMINE 30 MG/ML IJ SOLN
15.0000 mg | Freq: Four times a day (QID) | INTRAMUSCULAR | Status: DC
  Administered 2015-12-07 – 2015-12-10 (×13): 15 mg via INTRAVENOUS
  Filled 2015-12-07 (×21): qty 1

## 2015-12-07 MED ORDER — HYDROMORPHONE HCL 2 MG/ML IJ SOLN: 0.0250 mg/kg | INTRAMUSCULAR | Status: AC

## 2015-12-07 MED ORDER — ONDANSETRON HCL 4 MG/2ML IJ SOLN: 4.0000 mg | Freq: Four times a day (QID) | INTRAMUSCULAR | Status: DC | PRN

## 2015-12-07 MED ORDER — VITAMIN D 50 MCG (2000 UT) PO CAPS: 2000.0000 [IU] | ORAL_CAPSULE | Freq: Every day | ORAL | Status: DC

## 2015-12-07 NOTE — ED Notes (Signed)
[facility]

## 2015-12-07 NOTE — H&P (Signed)
Hospital Admission Note Date: 12/07/2015  Patient name: Shawn Adams Medical record number: [id number] Date of birth: 1975/12/09 Age: 40 y.o. Gender: male PCP: Massie MaroonHollis,[name], FNP  Attending physician: Altha HarmMichelle A Raphael Fitzpatrick, MD  Chief Complaint:Pain in back and legs x several days  History of Present Illness:This is an opiate tolerant patient with Hb SS who presents to ED with c/o pain in legs, back and chest wall which is occurring in a migratory pattern. He reports that pain is typical of sickle cell crisis. He denies any associated symptoms and states that his oral analgesics were ineffective in controlling pain . He reports pain at an intensity of 8/10 and sharp in nature. There is no radiation of pain and he is unable to identify any palliative or provocative features.   He denies fever, chills, headaches, blurring of vision, focal neurological deficits, vomiting or diarrhea.   Presently he has received Dilaudid and his sister who is present sates that he gets "loopy" with IV pain medications. However patient is able to carry on a lucid conversation.   Scheduled Meds:  Continuous Infusions: . sodium chloride 150 mL/hr (12/07/15 1307)  . sodium chloride     PRN Meds:.diphenhydrAMINE, ondansetron Allergies: Review of patient's allergies indicates no known allergies. Past Medical History  Diagnosis Date  . Sickle cell anemia (HCC)   . Aneurysm (HCC)     Pt states happened in 2011, Brain   . Heart murmur    Past Surgical History  Procedure Laterality Date  . Lymph node biopsy     Family History  Problem Relation Age of Onset  . Diabetes Mother    Social History   Social History  . Marital Status: Single    Spouse Name: N/A  . Number of Children: N/A  . Years of Education: N/A   Occupational History  . Not on file.   Social History Main Topics  . Smoking status: Former Games developermoker  . Smokeless tobacco: Never Used  . Alcohol Use: No     Comment: occasionally  . Drug  Use: No     Comment: Pt states uses 2 times a day  . Sexual Activity: Yes    Birth Control/ Protection: None   Other Topics Concern  . Not on file   Social History Narrative   Review of Systems: A comprehensive review of systems was negative except as noted in HPI.   Physical Exam: No intake or output data in the 24 hours ending 12/07/15 1459 General: Alert, Intermittently somnolent but arousable oriented x3, in no acute distress.  HEENT: Pace/AT PEERL, EOMI, anicteric Neck: Trachea midline,  no masses, no thyromegal,y no JVD, no carotid bruit OROPHARYNX:  Moist, No exudate/ erythema/lesions.  Heart: Regular rate and rhythm, without murmurs, rubs, gallops, PMI non-displaced, no heaves or thrills on palpation.  Lungs: Clear to auscultation, no wheezing or rhonchi noted. No increased vocal fremitus resonant to percussion  Abdomen: Soft, nontender, nondistended, positive bowel sounds, no masses no hepatosplenomegaly noted.  Neuro: No focal neurological deficits noted cranial nerves II through XII grossly intact.  Strength at functional baseline in bilateral upper and lower extremities. Musculoskeletal: No warmth swelling or erythema around joints, no spinal tenderness noted. Psychiatric: Patient alert and oriented x3, somewhat loopy secondary to pain medication.   Lab results:  Recent Labs  12/07/15 0954  NA 140  K 4.3  CL 113*  CO2 18*  GLUCOSE 108*  BUN 15  CREATININE 1.41*  CALCIUM 9.4    Recent Labs  12/07/15 0954  AST 82*  ALT 39  ALKPHOS 129*  BILITOT 3.5*  PROT 7.2  ALBUMIN 3.7   No results for input(s): LIPASE, AMYLASE in the last 72 hours.  Recent Labs  12/07/15 0954  WBC 13.8*  NEUTROABS 7.5  HGB 5.3*  HCT 14.5*  MCV 84.8  PLT 343   No results for input(s): CKTOTAL, CKMB, CKMBINDEX, TROPONINI in the last 72 hours. Invalid input(s): POCBNP No results for input(s): DDIMER in the last 72 hours. No results for input(s): HGBA1C in the last 72  hours. No results for input(s): CHOL, HDL, LDLCALC, TRIG, CHOLHDL, LDLDIRECT in the last 72 hours. No results for input(s): TSH, T4TOTAL, T3FREE, THYROIDAB in the last 72 hours.  Invalid input(s): FREET3  Recent Labs  12/07/15 1317  RETICCTPCT 22.4*   Imaging results:  No results found.   Assessment and Plan: 1. Hb SS with crisis: I will start the patient on a PCA however the low-dose PCA bolus of 0.4 mg. Start IV Toradol and IV fluids. I will reassess pain control in the morning. 2. Symptomatic anemia: Patient has a baseline hemoglobin of 7. Currently his hemoglobin is 5.3 and he'll be transfused one unit of blood. He's currently hypoxic with oxygen saturation in the 80s on room air. We will look for improvement in her hypoxia after blood is been transfused. 3. Hypoxemia: A review of patient's records from [facility] from November 2015 shows a diagnosis of moderate persistent asthma not an exacerbation and granulomatous disease likely sarcoid. Currently he has no wheezing or no acute respiratory symptoms. I do believe that the hypoxemia secondary to his low hemoglobin. If he has no resolution of the hypoxemia after transfusion will pursue further diagnostic studies. 4. Anemia of Chronic Disease: Baseline hemoglobin of 7. Continue to monitor 5. H/O Sarcoidosis: Patient is currently on prednisone 20 mg. Although hypoxic he has no cough or secretions to indicate a decompensation of his sarcoidosis. I will continue him on prednisone 20 mg 6. Chronic kidney disease stage III: A review of records from [facility] nephrology shows that the patient has a diagnosis of chronic kidney disease stage III improve with treatment of sarcoid. However here today he's noted to have a GFR greater than 60. We'll continue to monitor renal function. 7. DVT prophylaxis: Lovenox  Time spent 1 hour  Raphael Fitzpatrick A. 12/07/2015, 2:59 PM

## 2015-12-07 NOTE — ED Provider Notes (Signed)
CSN: [id number]     Arrival date & time 12/07/15  1050 History   First MD Initiated Contact with Patient 12/07/15 1238     No chief complaint on file.    (Consider location/radiation/quality/duration/timing/severity/associated sxs/prior Treatment) HPI Comments: Patient here complaining of sickle cell crisis which began 8:30 this morning. He has noticed some dyspnea without chest pain or fever or cough or congestion. Denies any abdominal or chest discomfort. Pain is characterized as dull and located in his back and legs similar to his prior sickle cell crisis. He has follow-up at the sickle cell clinic. Has been compliant with his home meds. Denies any headache or neurological deficits.  The history is provided by the patient.    Past Medical History  Diagnosis Date  . Sickle cell anemia (HCC)   . Aneurysm (HCC)     Pt states happened in 2011, Brain   . Heart murmur    Past Surgical History  Procedure Laterality Date  . Lymph node biopsy     Family History  Problem Relation Age of Onset  . Diabetes Mother    Social History  Substance Use Topics  . Smoking status: Former Games developermoker  . Smokeless tobacco: Never Used  . Alcohol Use: No     Comment: occasionally    Review of Systems  All other systems reviewed and are negative.     Allergies  Review of patient's allergies indicates no known allergies.  Home Medications   Prior to Admission medications   Medication Sig Start Date End Date Taking? Authorizing Provider  acetaminophen (TYLENOL) 500 MG tablet Take 1,000 mg by mouth every 8 (eight) hours as needed for mild pain, moderate pain, fever or headache.   Yes Historical Provider, MD  Cholecalciferol (VITAMIN D) 2000 UNITS CAPS Take 1 capsule (2,000 Units total) by mouth daily. 01/29/15  Yes Altha HarmMichelle A Matthews, MD  famotidine (PEPCID) 20 MG tablet Take 20 mg by mouth daily.    Yes Historical Provider, MD  folic acid (FOLVITE) 1 MG tablet Take 1 mg by mouth daily.    Yes  Historical Provider, MD  hydroxyurea (HYDREA) 500 MG capsule Take 1,000 mg by mouth daily. May take with food to minimize GI side effects.   Yes Historical Provider, MD  oxyCODONE-acetaminophen (PERCOCET) 10-325 MG tablet Take 1-2 tablets by mouth every 6 (six) hours as needed for pain.   Yes Historical Provider, MD  predniSONE (DELTASONE) 20 MG tablet Take 20 mg by mouth daily with breakfast.   Yes Historical Provider, MD   BP 120/77 mmHg  Pulse 72  Temp(Src) 97.6 F (36.4 C) (Oral)  Resp 18  SpO2 94% Physical Exam  Constitutional: He is oriented to person, place, and time. He appears well-developed and well-nourished.  Non-toxic appearance. No distress.  HENT:  Head: Normocephalic and atraumatic.  Eyes: Conjunctivae, EOM and lids are normal. Pupils are equal, round, and reactive to light.  Neck: Normal range of motion. Neck supple. No tracheal deviation present. No thyroid mass present.  Cardiovascular: Normal rate, regular rhythm and normal heart sounds.  Exam reveals no gallop.   No murmur heard. Pulmonary/Chest: Effort normal and breath sounds normal. No stridor. No respiratory distress. He has no decreased breath sounds. He has no wheezes. He has no rhonchi. He has no rales.  Abdominal: Soft. Normal appearance and bowel sounds are normal. He exhibits no distension. There is no tenderness. There is no rebound and no CVA tenderness.  Musculoskeletal: Normal range of motion. He exhibits  no edema or tenderness.  Neurological: He is alert and oriented to person, place, and time. He has normal strength. No cranial nerve deficit or sensory deficit. GCS eye subscore is 4. GCS verbal subscore is 5. GCS motor subscore is 6.  Skin: Skin is warm and dry. No abrasion and no rash noted.  Psychiatric: He has a normal mood and affect. His speech is normal and behavior is normal.  Nursing note and vitals reviewed.   ED Course  Procedures (including critical care time) Labs Review Labs Reviewed   RETICULOCYTES  TYPE AND SCREEN    Imaging Review No results found. I have personally reviewed and evaluated these images and lab results as part of my medical decision-making.   EKG Interpretation None      MDM   Final diagnoses:  None    Patient ordered IV fluids and opiate medications. Hemoglobin noted. Will likely order PRBC transfusion and admit to medicine    Lorre Nick, MD 12/07/15 1318

## 2015-12-07 NOTE — ED Notes (Addendum)
RATE INCREASED TO 150ML/HR FOR PRBC

## 2015-12-07 NOTE — ED Notes (Signed)
Bed: [facility] Expected date:  Expected time:  Means of arrival:  Comments: 

## 2015-12-07 NOTE — ED Notes (Signed)
[facility] after being triaged due to wait. C/o back pain and bilateral leg pain. No pain meds taken before arrival. Patient ambulatory to triage room with steady gait. Eating [employer] upon this writer's entry into room. In NAD.

## 2015-12-07 NOTE — ED Notes (Signed)
Pt c/o back and B/L leg pain related to his sickle cell onset "just now." Pt has not taken any medication for the pain.

## 2015-12-08 DIAGNOSIS — D638 Anemia in other chronic diseases classified elsewhere: Secondary | ICD-10-CM

## 2015-12-08 LAB — CBC WITH DIFFERENTIAL/PLATELET
BAND NEUTROPHILS: 0 %
BASOS ABS: 0 10*3/uL (ref 0.0–0.1)
Basophils Relative: 0 %
Blasts: 0 %
EOS ABS: 0.6 10*3/uL (ref 0.0–0.7)
EOS PCT: 8 %
HCT: 14.4 % — ABNORMAL LOW (ref 39.0–52.0)
Hemoglobin: 5.4 g/dL — CL (ref 13.0–17.0)
LYMPHS ABS: 2.7 10*3/uL (ref 0.7–4.0)
Lymphocytes Relative: 33 %
MCH: 32.9 pg (ref 26.0–34.0)
MCHC: 37.5 g/dL — ABNORMAL HIGH (ref 30.0–36.0)
MCV: 87.8 fL (ref 78.0–100.0)
METAMYELOCYTES PCT: 0 %
MONO ABS: 0.4 10*3/uL (ref 0.1–1.0)
MONOS PCT: 5 %
MYELOCYTES: 3 %
NEUTROS ABS: 4.4 10*3/uL (ref 1.7–7.7)
Neutrophils Relative %: 51 %
Other: 0 %
PLATELETS: 292 10*3/uL (ref 150–400)
Promyelocytes Absolute: 0 %
RBC: 1.64 MIL/uL — AB (ref 4.22–5.81)
RDW: 24.4 % — AB (ref 11.5–15.5)
WBC: 8.1 10*3/uL (ref 4.0–10.5)
nRBC: 19 /100 WBC — ABNORMAL HIGH

## 2015-12-08 LAB — RETICULOCYTES
RBC.: 2.2 MIL/uL — AB (ref 4.22–5.81)
RETIC COUNT ABSOLUTE: 442.2 10*3/uL — AB (ref 19.0–186.0)
RETIC CT PCT: 20.1 % — AB (ref 0.4–3.1)

## 2015-12-08 LAB — PREPARE RBC (CROSSMATCH)

## 2015-12-08 MED ORDER — OXYCODONE HCL 5 MG PO TABS
5.0000 mg | ORAL_TABLET | Freq: Once | ORAL | Status: AC
  Administered 2015-12-08: 5 mg via ORAL
  Filled 2015-12-08: qty 1

## 2015-12-08 MED ORDER — SODIUM CHLORIDE 0.9 % IV SOLN: Freq: Once | INTRAVENOUS | Status: DC

## 2015-12-08 MED ORDER — OXYCODONE-ACETAMINOPHEN 5-325 MG PO TABS
1.0000 | ORAL_TABLET | Freq: Once | ORAL | Status: AC
  Administered 2015-12-08: 1 via ORAL
  Filled 2015-12-08: qty 1

## 2015-12-08 MED ORDER — HYDROMORPHONE HCL 1 MG/ML IJ SOLN
1.0000 mg | INTRAMUSCULAR | Status: DC | PRN
  Administered 2015-12-08: 1 mg via SUBCUTANEOUS
  Filled 2015-12-08: qty 1

## 2015-12-08 NOTE — Progress Notes (Signed)
SICKLE CELL SERVICE PROGRESS NOTE  Shawn Adams [id number] DOB: Apr 21, 1976 DOA: 12/07/2015 PCP: Massie Maroon, FNP  Assessment/Plan: Active Problems:   Sickle cell anemia (HCC)  1. Hb SS with crisis: Pt reports pain at 7/10. He has used almost no medication on the PCA. I have educated patient on the use of the PCA. No changes to medication for today. 2. Leukocytosis: Resolved. 3. Anemia of chronic disease: S/P transfusion 1 unit RBC's. Post-transfusion Hb 7.0.    Code Status: Full Code Family Communication: N/A Disposition Plan: Not yet ready for discharge  [name] A.  Pager [phone]. If 7PM-7AM, please contact night-coverage.  12/08/2015, 3:19 PM  LOS: 1 day   Interim History: Pt states that he feels better today. Rates intensity of pain at 7/10  Consultants:  None  Procedures:  None  Antibiotics:  None   Objective: Filed Vitals:   12/08/15 1016 12/08/15 1138 12/08/15 1444 12/08/15 1500  BP: 122/68  109/73   Pulse: 62  61   Temp: 97.5 F (36.4 C)  97.5 F (36.4 C)   TempSrc: Oral  Oral   Resp: Height:      Weight:      SpO2: 100% 93% 99% 97%   Weight change:   Intake/Output Summary (Last 24 hours) at 12/08/15 1519 Last data filed at 12/08/15 0923  Gross per 24 hour  Intake 2734.17 ml  Output    900 ml  Net 1834.17 ml    General: Alert, awake, oriented x3, in no acute distress.  HEENT: [place]/AT PEERL, EOMI, anicteric Neck: Trachea midline,  no masses, no thyromegal,y no JVD, no carotid bruit OROPHARYNX:  Moist, No exudate/ erythema/lesions.  Heart: Regular rate and rhythm, without murmurs, rubs, gallops, PMI non-displaced, no heaves or thrills on palpation.  Lungs: Clear to auscultation, no wheezing or rhonchi noted. No increased vocal fremitus resonant to percussion  Abdomen: Soft, nontender, nondistended, positive bowel sounds, no masses no hepatosplenomegaly noted..  Neuro: No focal neurological deficits noted cranial  nerves II through XII grossly intact.  Strength at functional baseline in bilateral upper and lower extremities. Musculoskeletal: No warm swelling or erythema around joints, no spinal tenderness noted. Psychiatric: Patient alert and oriented x3, good insight and cognition, good recent to remote recall.    Data Reviewed: Basic Metabolic Panel:  Recent Labs Lab 12/07/15 0954  NA 140  K 4.3  CL 113*  CO2 18*  GLUCOSE 108*  BUN 15  CREATININE 1.41*  CALCIUM 9.4   Liver Function Tests:  Recent Labs Lab 12/07/15 0954  AST 82*  ALT 39  ALKPHOS 129*  BILITOT 3.5*  PROT 7.2  ALBUMIN 3.7   No results for input(s): LIPASE, AMYLASE in the last 168 hours. No results for input(s): AMMONIA in the last 168 hours. CBC:  Recent Labs Lab 12/07/15 0954 12/08/15 0516 12/08/15 1259  WBC 13.8* 8.1 8.1  NEUTROABS 7.5 4.4  --   HGB 5.3* 5.4* 7.0*  HCT 14.5* 14.4* 19.3*  MCV 84.8 87.8 86.9  PLT 343 292 350   Cardiac Enzymes: No results for input(s): CKTOTAL, CKMB, CKMBINDEX, TROPONINI in the last 168 hours. BNP (last 3 results) No results for input(s): BNP in the last 8760 hours.  ProBNP (last 3 results) No results for input(s): PROBNP in the last 8760 hours.  CBG: No results for input(s): GLUCAP in the last 168 hours.  No results found for this or any previous visit (from the past 240 hour(s)).   Studies:  No results found.  Scheduled Meds: . sodium chloride   Intravenous Once  . cholecalciferol  2,000 Units Oral Daily  . enoxaparin (LOVENOX) injection  40 mg Subcutaneous Q24H  . famotidine  20 mg Oral Daily  . folic acid  1 mg Oral Daily  . HYDROmorphone   Intravenous 6 times per day  . ketorolac  15 mg Intravenous 4 times per day  . predniSONE  20 mg Oral Q breakfast  . senna-docusate  1 tablet Oral BID   Continuous Infusions: . sodium chloride 125 mL/hr at 12/07/15 1758    Time spent 25 minutes

## 2015-12-09 DIAGNOSIS — K5909 Other constipation: Secondary | ICD-10-CM

## 2015-12-09 LAB — TYPE AND SCREEN
ABO/RH(D): B POS
Antibody Screen: NEGATIVE
DONOR AG TYPE: NEGATIVE
Donor AG Type: NEGATIVE
Donor AG Type: NEGATIVE
UNIT DIVISION: 0
Unit division: 0
Unit division: 0

## 2015-12-09 LAB — CBC
HCT: 19.3 % — ABNORMAL LOW (ref 39.0–52.0)
HEMOGLOBIN: 7 g/dL — AB (ref 13.0–17.0)
MCH: 31.5 pg (ref 26.0–34.0)
MCHC: 36.3 g/dL — ABNORMAL HIGH (ref 30.0–36.0)
MCV: 86.9 fL (ref 78.0–100.0)
PLATELETS: 350 10*3/uL (ref 150–400)
RBC: 2.22 MIL/uL — AB (ref 4.22–5.81)
RDW: 23.2 % — ABNORMAL HIGH (ref 11.5–15.5)
WBC: 8.1 10*3/uL (ref 4.0–10.5)

## 2015-12-09 NOTE — Progress Notes (Signed)
PCa machine malfunctioning when RN came to unit at 1500. MD paged to see if PCA could be d/ced since py c/o 2/10 pajn and used bolus once in last 4 hours. No MD return phone call or new orders placed. New PCA machine ordered and recalibrated to pt's settings.

## 2015-12-09 NOTE — Progress Notes (Signed)
SICKLE CELL SERVICE PROGRESS NOTE  Aksel Bencomo XLK:[id number] DOB: 26-Nov-1975 DOA: 12/07/2015 PCP: Massie Maroon, FNP  Assessment/Plan: Active Problems:   Sickle cell anemia (HCC)  1. Hb SS with crisis: Pt reports pain at 6/10. He has used only 6 mg on the PCA in the last 24 hours (16/15:demands/deliveries) almost no medication on the PCA. I have educated patient on the use of the PCA. No changes to medication for today. If not increased use by this afternoon will tranasition to oral analgesics  2. Leukocytosis: Resolved. 3. Anemia of chronic disease: S/P transfusion 1 unit RBC's. Post-transfusion Hb 7.0. 4. Constipation: Pt has not had a BM in more than 3 days. MiraLAX ordered but not given. Will ask    Code Status: Full Code Family Communication: N/A Disposition Plan: Not yet ready for discharge  Tkeya Stencil A.  Pager [phone]. If 7PM-7AM, please contact night-coverage.  12/09/2015, 11:46 AM  LOS: 2 days   Interim History: Pt states that he feels better today. Rates intensity of pain at 6/10 in back and chest wall.  Consultants:  None  Procedures:  None  Antibiotics:  None   Objective: Filed Vitals:   12/08/15 2346 12/09/15 0400 12/09/15 0537 12/09/15 0800  BP:   101/56   Pulse:   52   Temp:   98 F (36.7 C)   TempSrc:   Oral   Resp: Height:      Weight:      SpO2: 96% 96% 99% 98%   Weight change:   Intake/Output Summary (Last 24 hours) at 12/09/15 1146 Last data filed at 12/09/15 0537  Gross per 24 hour  Intake      0 ml  Output    700 ml  Net   -700 ml    General: Alert, awake, oriented x3, in mild distress.  HEENT: [place]/AT PEERL, EOMI, anicteric Neck: Trachea midline,  no masses, no thyromegal,y no JVD, no carotid bruit OROPHARYNX:  Moist, No exudate/ erythema/lesions.  Heart: Regular rate and rhythm, without murmurs, rubs, gallops, PMI non-displaced, no heaves or thrills on palpation.  Lungs: Clear to auscultation, no  wheezing or rhonchi noted. No increased vocal fremitus resonant to percussion  Abdomen: Soft, nontender, nondistended, positive bowel sounds, no masses no hepatosplenomegaly noted.  Neuro: No focal neurological deficits noted cranial nerves II through XII grossly intact.  Strength at functional baseline in bilateral upper and lower extremities. Musculoskeletal: No warmth swelling or erythema around joints, no spinal tenderness noted. Psychiatric: Patient alert and oriented x3, good insight and cognition, good recent to remote recall.    Data Reviewed: Basic Metabolic Panel:  Recent Labs Lab 12/07/15 0954  NA 140  K 4.3  CL 113*  CO2 18*  GLUCOSE 108*  BUN 15  CREATININE 1.41*  CALCIUM 9.4   Liver Function Tests:  Recent Labs Lab 12/07/15 0954  AST 82*  ALT 39  ALKPHOS 129*  BILITOT 3.5*  PROT 7.2  ALBUMIN 3.7   No results for input(s): LIPASE, AMYLASE in the last 168 hours. No results for input(s): AMMONIA in the last 168 hours. CBC:  Recent Labs Lab 12/07/15 0954 12/08/15 0516 12/08/15 1259  WBC 13.8* 8.1 8.1  NEUTROABS 7.5 4.4  --   HGB 5.3* 5.4* 7.0*  HCT 14.5* 14.4* 19.3*  MCV 84.8 87.8 86.9  PLT 343 292 350   Cardiac Enzymes: No results for input(s): CKTOTAL, CKMB, CKMBINDEX, TROPONINI in the last 168 hours. BNP (last 3 results) No results  for input(s): BNP in the last 8760 hours.  ProBNP (last 3 results) No results for input(s): PROBNP in the last 8760 hours.  CBG: No results for input(s): GLUCAP in the last 168 hours.  No results found for this or any previous visit (from the past 240 hour(s)).   Studies: No results found.  Scheduled Meds: . sodium chloride   Intravenous Once  . cholecalciferol  2,000 Units Oral Daily  . enoxaparin (LOVENOX) injection  40 mg Subcutaneous Q24H  . famotidine  20 mg Oral Daily  . folic acid  1 mg Oral Daily  . HYDROmorphone   Intravenous 6 times per day  . ketorolac  15 mg Intravenous 4 times per day  .  predniSONE  20 mg Oral Q breakfast  . senna-docusate  1 tablet Oral BID   Continuous Infusions: . sodium chloride 125 mL/hr at 12/09/15 0520    Time spent 30 minutes

## 2015-12-10 DIAGNOSIS — D599 Acquired hemolytic anemia, unspecified: Secondary | ICD-10-CM

## 2015-12-10 DIAGNOSIS — D869 Sarcoidosis, unspecified: Secondary | ICD-10-CM

## 2015-12-10 DIAGNOSIS — N183 Chronic kidney disease, stage 3 (moderate): Secondary | ICD-10-CM

## 2015-12-10 LAB — RETICULOCYTES: RBC.: 2.2 MIL/uL — ABNORMAL LOW (ref 4.22–5.81)

## 2015-12-10 LAB — CBC WITH DIFFERENTIAL/PLATELET
BASOS ABS: 0 10*3/uL (ref 0.0–0.1)
BASOS PCT: 0 %
EOS ABS: 0.3 10*3/uL (ref 0.0–0.7)
Eosinophils Relative: 3 %
HCT: 19.2 % — ABNORMAL LOW (ref 39.0–52.0)
Hemoglobin: 7 g/dL — ABNORMAL LOW (ref 13.0–17.0)
LYMPHS PCT: 28 %
Lymphs Abs: 2.6 10*3/uL (ref 0.7–4.0)
MCH: 31.8 pg (ref 26.0–34.0)
MCHC: 36.5 g/dL — ABNORMAL HIGH (ref 30.0–36.0)
MCV: 87.3 fL (ref 78.0–100.0)
MONO ABS: 0.6 10*3/uL (ref 0.1–1.0)
Monocytes Relative: 7 %
NEUTROS ABS: 5.7 10*3/uL (ref 1.7–7.7)
NEUTROS PCT: 62 %
PLATELETS: 340 10*3/uL (ref 150–400)
RBC: 2.2 MIL/uL — ABNORMAL LOW (ref 4.22–5.81)
RDW: 24 % — AB (ref 11.5–15.5)
WBC: 9.2 10*3/uL (ref 4.0–10.5)

## 2015-12-10 LAB — BASIC METABOLIC PANEL
Anion gap: 8 (ref 5–15)
BUN: 26 mg/dL — AB (ref 6–20)
CHLORIDE: 111 mmol/L (ref 101–111)
CO2: 18 mmol/L — AB (ref 22–32)
CREATININE: 1.63 mg/dL — AB (ref 0.61–1.24)
Calcium: 8.6 mg/dL — ABNORMAL LOW (ref 8.9–10.3)
GFR calc Af Amer: 60 mL/min — ABNORMAL LOW (ref >60)
GFR calc non Af Amer: 52 mL/min — ABNORMAL LOW (ref >60)
GLUCOSE: 92 mg/dL (ref 65–99)
POTASSIUM: 4.5 mmol/L (ref 3.5–5.1)
Sodium: 137 mmol/L (ref 135–145)

## 2015-12-10 MED ORDER — OXYCODONE-ACETAMINOPHEN 5-325 MG PO TABS
1.0000 | ORAL_TABLET | ORAL | Status: DC
Start: 2015-12-10 — End: 2015-12-10
  Administered 2015-12-10 (×3): 1 via ORAL
  Filled 2015-12-10 (×3): qty 1

## 2015-12-10 MED ORDER — HYDROMORPHONE HCL 1 MG/ML IJ SOLN: 1.0000 mg | INTRAMUSCULAR | Status: DC | PRN

## 2015-12-10 MED ORDER — HYDROXYUREA 500 MG PO CAPS
1000.0000 mg | ORAL_CAPSULE | Freq: Every day | ORAL | Status: DC
  Administered 2015-12-10: 1000 mg via ORAL
  Filled 2015-12-10: qty 2

## 2015-12-10 MED ORDER — OXYCODONE HCL 5 MG PO TABS
5.0000 mg | ORAL_TABLET | ORAL | Status: DC
  Administered 2015-12-10 (×3): 5 mg via ORAL
  Filled 2015-12-10 (×3): qty 1

## 2015-12-10 NOTE — Care Management Important Message (Signed)
Important Message  Patient Details  Name: Shawn Adams MRN: [id number] Date of Birth: [birth year]   Medicare Important Message Given:  Yes    Haskell FlirtJamison, Shama Monfils 12/10/2015, 9:14 AMImportant Message  Patient Details  Name: Shawn Adams MRN: [id number] Date of Birth: [birth year]   Medicare Important Message Given:  Yes    Haskell FlirtJamison, Monai Hindes 12/10/2015, 9:14 AM

## 2015-12-10 NOTE — Progress Notes (Signed)
Pt's PCA DC'd. PCA Dilaudid 9 cc wasted in sink with Silvano BilisKiristin Ryan, RN to verify.

## 2015-12-10 NOTE — Progress Notes (Addendum)
SICKLE CELL SERVICE PROGRESS NOTE  Mariam DollarLuke Forbush [id number]:[id number] DOB: December 24, 1975 DOA: 12/07/2015 PCP: Massie MaroonHollis,[name], FNP  Assessment/Plan: Active Problems:   Sickle cell anemia (HCC)  1. Hb SS with crisis: Pt reports pain at 6/10. He has used only 4mg  on the PCA in the last 24 hours (16/15:demands/deliveries) almost no medication on the PCA. Will change to oral analgesics and if pain controlled on oral medications will to plan to discharge home later today or tomorrow.  2. Leukocytosis: Resolved. 3. Acute Hemolytic Anemia in setting of Anemia of chronic disease: S/P transfusion 1 unit RBC's. Post-transfusion Hb 7.0 2 days ago. Labs pending from today. Will follow up. 4. Constipation: Pt has not had a BM in more than 3 days. MiraLAX  given. Will give Magnesium citrate if no BM in the next several hours. Continue Senna-S.   Code Status: Full Code Family Communication: N/A Disposition Plan: Likely discharge later today or tomorrow.  Marshaun Lortie A.  Pager [phone]. If 7PM-7AM, please contact night-coverage.  12/10/2015, 9:32 AM  LOS: 3 days   Interim History: Pt states that he feels better today. Rates intensity of pain at 5-6/10 in back and chest wall.  Consultants:  None  Procedures:  None  Antibiotics:  None   Objective: Filed Vitals:   12/10/15 0017 12/10/15 0359 12/10/15 0626 12/10/15 0800  BP:   137/83   Pulse:   70   Temp:   98.3 F (36.8 C)   TempSrc:   Oral   Resp: 16 11 17 10   Height:      Weight:      SpO2: 96% 97% 96% 95%   Weight change:   Intake/Output Summary (Last 24 hours) at 12/10/15 0932 Last data filed at 12/10/15 0600  Gross per 24 hour  Intake      0 ml  Output    700 ml  Net   -700 ml    General: Alert, awake, oriented x3, in no apparent distress. Well appearing. HEENT: [place]/AT PEERL, EOMI, anicteric Neck: Trachea midline,  no masses, no thyromegal,y no JVD, no carotid bruit OROPHARYNX:  Moist, No exudate/ erythema/lesions.   Heart: Regular rate and rhythm, without murmurs, rubs, gallops, PMI non-displaced, no heaves or thrills on palpation.  Lungs: Clear to auscultation, no wheezing or rhonchi noted. No increased vocal fremitus resonant to percussion  Abdomen: Soft, nontender, nondistended, positive bowel sounds, no masses no hepatosplenomegaly noted.  Neuro: No focal neurological deficits noted cranial nerves II through XII grossly intact.  Strength at functional baseline in bilateral upper and lower extremities. Musculoskeletal: No warmth swelling or erythema around joints, no spinal tenderness noted. Psychiatric: Patient alert and oriented x3, good insight and cognition, good recent to remote recall.    Data Reviewed: Basic Metabolic Panel:  Recent Labs Lab 12/07/15 0954  NA 140  K 4.3  CL 113*  CO2 18*  GLUCOSE 108*  BUN 15  CREATININE 1.41*  CALCIUM 9.4   Liver Function Tests:  Recent Labs Lab 12/07/15 0954  AST 82*  ALT 39  ALKPHOS 129*  BILITOT 3.5*  PROT 7.2  ALBUMIN 3.7   No results for input(s): LIPASE, AMYLASE in the last 168 hours. No results for input(s): AMMONIA in the last 168 hours. CBC:  Recent Labs Lab 12/07/15 0954 12/08/15 0516 12/08/15 1259  WBC 13.8* 8.1 8.1  NEUTROABS 7.5 4.4  --   HGB 5.3* 5.4* 7.0*  HCT 14.5* 14.4* 19.3*  MCV 84.8 87.8 86.9  PLT 343 292 350  Cardiac Enzymes: No results for input(s): CKTOTAL, CKMB, CKMBINDEX, TROPONINI in the last 168 hours. BNP (last 3 results) No results for input(s): BNP in the last 8760 hours.  ProBNP (last 3 results) No results for input(s): PROBNP in the last 8760 hours.  CBG: No results for input(s): GLUCAP in the last 168 hours.  No results found for this or any previous visit (from the past 240 hour(s)).   Studies: No results found.  Scheduled Meds: . sodium chloride   Intravenous Once  . cholecalciferol  2,000 Units Oral Daily  . enoxaparin (LOVENOX) injection  40 mg Subcutaneous Q24H  .  famotidine  20 mg Oral Daily  . folic acid  1 mg Oral Daily  . ketorolac  15 mg Intravenous 4 times per day  . oxyCODONE-acetaminophen  1 tablet Oral Q4H   And  . oxyCODONE  5 mg Oral Q4H  . predniSONE  20 mg Oral Q breakfast  . senna-docusate  1 tablet Oral BID   Continuous Infusions: . sodium chloride 125 mL/hr at 12/10/15 0855    Time spent 20 minutes

## 2015-12-10 NOTE — Progress Notes (Signed)
SICKLE CELL SERVICE PROGRESS NOTE  Shawn Adams [id number] DOB: [birth year] DOA: 12/07/2015 PCP: Massie Maroon, FNP  Assessment/Plan: Active Problems:   Sickle cell anemia (HCC)  1. Hb SS with crisis: Pt reports pain at 5/10. Will discontinue the PCA and continue oral analgesics and if pain controlled on oral medications will to plan to discharge home later today or tomorrow.  2. Leukocytosis: Resolved. 3. Acute Hemolytic Anemia in setting of Anemia of chronic disease: S/P transfusion 1 unit RBC's. Post-transfusion Hb 7.0 . Will resume Hydrea. 4. Constipation: Pt has not had a BM in more than 3 days. MiraLAX  given. Will give Magnesium citrate if no BM in the next several hours. Continue Senna-S. 5. CKD III: Pt has a slight creatinine that ranges from 1.5-1.7. Patient currently at creatinine of 1.63. He should follow-up with either his primary care physician or nephrologist to evaluate renal function in approximately one week. 6. History of suspected sarcoid: Patient has a history of suspected sarcoid not verified. He is currently on prednisone 20 mg a daily basis. This is been discontinued during his hospitalization without any increase in dosage. I will continue.   Code Status: Full Code Family Communication: N/A Disposition Plan: Likely discharge later today or tomorrow.  MATTHEWS,MICHELLE A.  Pager [phone]. If 7PM-7AM, please contact night-coverage.  12/10/2015, 7:31 PM  LOS: 3 days   Interim History: Pt states that he feels better today. Rates intensity of pain at 4-5/10 in back and chest wall.  Consultants:  None  Procedures:  None  Antibiotics:  None   Objective: Filed Vitals:   12/10/15 0359 12/10/15 0626 12/10/15 0800 12/10/15 1507  BP:  137/83  122/69  Pulse:  70  83  Temp:  98.3 F (36.8 C)  97.7 F (36.5 C)  TempSrc:  Oral  Oral  Resp: Height:      Weight:      SpO2: 97% 96% 95% 90%   Weight change:   Intake/Output Summary (Last  24 hours) at 12/10/15 1931 Last data filed at 12/10/15 1753  Gross per 24 hour  Intake    895 ml  Output   1900 ml  Net  -1005 ml    General: Alert, awake, oriented x3, in no apparent distress. Well appearing. HEENT: [place]/AT PEERL, EOMI, anicteric Neck: Trachea midline,  no masses, no thyromegal,y no JVD, no carotid bruit OROPHARYNX:  Moist, No exudate/ erythema/lesions.  Heart: Regular rate and rhythm, without murmurs, rubs, gallops, PMI non-displaced, no heaves or thrills on palpation.  Lungs: Clear to auscultation, no wheezing or rhonchi noted. No increased vocal fremitus resonant to percussion  Abdomen: Soft, nontender, nondistended, positive bowel sounds, no masses no hepatosplenomegaly noted.  Neuro: No focal neurological deficits noted cranial nerves II through XII grossly intact.  Strength at functional baseline in bilateral upper and lower extremities. Musculoskeletal: No warmth swelling or erythema around joints, no spinal tenderness noted. Psychiatric: Patient alert and oriented x3, good insight and cognition, good recent to remote recall.    Data Reviewed: Basic Metabolic Panel:  Recent Labs Lab 12/07/15 0954 12/10/15 0930  NA 140 137  K 4.3 4.5  CL 113* 111  CO2 18* 18*  GLUCOSE 108* 92  BUN 15 26*  CREATININE 1.41* 1.63*  CALCIUM 9.4 8.6*   Liver Function Tests:  Recent Labs Lab 12/07/15 0954  AST 82*  ALT 39  ALKPHOS 129*  BILITOT 3.5*  PROT 7.2  ALBUMIN 3.7   No results for input(s):  LIPASE, AMYLASE in the last 168 hours. No results for input(s): AMMONIA in the last 168 hours. CBC:  Recent Labs Lab 12/07/15 0954 12/08/15 0516 12/08/15 1259 12/10/15 0930  WBC 13.8* 8.1 8.1 9.2  NEUTROABS 7.5 4.4  --  5.7  HGB 5.3* 5.4* 7.0* 7.0*  HCT 14.5* 14.4* 19.3* 19.2*  MCV 84.8 87.8 86.9 87.3  PLT 343 292 350 340   Cardiac Enzymes: No results for input(s): CKTOTAL, CKMB, CKMBINDEX, TROPONINI in the last 168 hours. BNP (last 3 results) No results  for input(s): BNP in the last 8760 hours.  ProBNP (last 3 results) No results for input(s): PROBNP in the last 8760 hours.  CBG: No results for input(s): GLUCAP in the last 168 hours.  No results found for this or any previous visit (from the past 240 hour(s)).   Studies: No results found.  Scheduled Meds: . sodium chloride   Intravenous Once  . cholecalciferol  2,000 Units Oral Daily  . enoxaparin (LOVENOX) injection  40 mg Subcutaneous Q24H  . famotidine  20 mg Oral Daily  . folic acid  1 mg Oral Daily  . hydroxyurea  1,000 mg Oral Daily  . ketorolac  15 mg Intravenous 4 times per day  . oxyCODONE-acetaminophen  1 tablet Oral Q4H   And  . oxyCODONE  5 mg Oral Q4H  . predniSONE  20 mg Oral Q breakfast  . senna-docusate  1 tablet Oral BID   Continuous Infusions: . sodium chloride 10 mL/hr at 12/10/15 1031    Time spent 20 minutes    Addendum: Patient's pain reevaluated this evening and pain has remained at 4/10 on oral medications and without any need for supplemental IV pain medicines. Patient otherwise stable and is being discharged home.  MATTHEWS,MICHELLE A.

## 2015-12-18 NOTE — Discharge Summary (Signed)
Mariam DollarLuke Hemsley MRN: [id number] DOB/AGE: [birth year] 40 y.o.  Admit date: 12/07/2015 Discharge date: 12/18/2015  Primary Care Physician:  Massie MaroonHollis,[name], FNP   Discharge Diagnoses:   Patient Active Problem List   Diagnosis Date Noted  . Recurrent occipital headache 10/15/2015  . History of aneurysm 10/13/2015  . Left hip pain 07/30/2015  . Left ankle pain 07/08/2015  . Sickle cell crisis (HCC) 03/08/2015  . 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 Sickle cell anemia with crisis (HCC) 12/07/2014  . Sickle cell anemia (HCC) 06/20/2014  . CKD (chronic kidney disease) stage 3, GFR 30-59 ml/min 06/20/2014  . Leukocytosis 06/20/2014  . Sickle cell pain crisis (HCC) 06/20/2014  . Hypercalcemia 06/20/2014  . Symptomatic anemia 05/31/2014    DISCHARGE MEDICATION:   Medication List    TAKE these medications        acetaminophen 500 MG tablet  Commonly known as:  TYLENOL  Take 1,000 mg by mouth every 8 (eight) hours as needed for mild pain, moderate pain, fever or headache.     famotidine 20 MG tablet  Commonly known as:  PEPCID  Take 20 mg by mouth daily.     folic acid 1 MG tablet  Commonly known as:  FOLVITE  Take 1 mg by mouth daily.     hydroxyurea 500 MG capsule  Commonly known as:  HYDREA  Take 1,000 mg by mouth daily. May take with food to minimize GI side effects.     oxyCODONE-acetaminophen 10-325 MG tablet  Commonly known as:  PERCOCET  Take 1-2 tablets by mouth every 6 (six) hours as needed for pain.     predniSONE 20 MG tablet  Commonly known as:  DELTASONE  Take 20 mg by mouth daily with breakfast.     Vitamin D 2000 units Caps  Take 1 capsule (2,000 Units total) by mouth daily.            SIGNIFICANT DIAGNOSTIC STUDIES:  No results found.   No results found for this or any previous visit (from the past 240 hour(s)).  BRIEF ADMITTING H & P: This is an opiate tolerant patient with Hb SS who presents to ED with c/o pain in legs, back and  chest wall which is occurring in a migratory pattern. He reports that pain is typical of sickle cell crisis. He denies any associated symptoms and states that his oral analgesics were ineffective in controlling pain . He reports pain at an intensity of 8/10 and sharp in nature. There is no radiation of pain and he is unable to identify any palliative or provocative features.   He denies fever, chills, headaches, blurring of vision, focal neurological deficits, vomiting or diarrhea.   Presently he has received Dilaudid and his sister who is present sates that he gets "loopy" with IV pain medications. However patient is able to carry on a lucid conversation.    Hospital Course:  Present on Admission:  . Sickle cell anemia (HCC): This is an opiate tolerant patient who presented and was admitted with acute sickle cell crisis. He was treated with IV Dilaudid via PCA, pulse dose Toradol and IV fluids. As his pain improve he was transitioned to oral analgesics. He was able to maintain good pain control with oral analgesics and is being discharged home on his prehospital regimen.  . Acute hemolytic anemia in setting of anemia of chronic disease: The patient had a significantly elevated LDH which is an indicator of acute hemolysis. He presented with the  decrease hemoglobin down to 5.3. He received a transfusion of one unit of blood and at the time of discharge his hemoglobin was at 7. Hydrea was resumed prior to discharge.  . Chronic kidney disease stage III: The patient has a baseline creatinine that ranges anywhere between 1.5-1.7. At the time of discharge his creatinine was 1.63. Follow-up with his primary care physician for further evaluation.  . Chronic steroid use secondary to suspected sarcoid: The patient is chronically on prednisone 20 mg daily. He was continued on this dose of prednisone during his hospitalization and did not receive any stress dose of steroids.  . Constipation: Patient had not a  bowel movement for 3 days. He had been receiving that motility agents in addition total multiple doses of laxative. On the day of discharge he had a bowel movement prior to discharge.    Disposition and Follow-up:  Patient is discharged home in stable condition. He's to follow-up with his primary care physician in one to 2 weeks. He's also been advised to follow-up with his hematologist at [facility] as soon as possible.     Discharge Instructions    Activity as tolerated - No restrictions    Complete by:  As directed      Diet general    Complete by:  As directed            DISCHARGE EXAM:  General: Alert, awake, oriented x3, well-appearing. Vital signs: BP130/70, HR 69, T 97.4 F (36.3 C), temperature source Oral, RR 16, height 6' (1.829 m), weight 140 lb (63.504 kg), SpO2 95 % RA. HEENT: [place]/AT PEERL, EOMI, anicteric Neck: Trachea midline, no masses, no thyromegal,y no JVD, no carotid bruit OROPHARYNX: Moist, No exudate/ erythema/lesions.  Heart: Regular rate and rhythm, without murmurs, rubs, gallops or S3. PMI non-displaced. Exam reveals no decreased pulses. Pulmonary/Chest: Normal effort. Breath sounds normal. No. Apnea. Clear to auscultation,no stridor,  no wheezing and no rhonchi noted. No respiratory distress and no tenderness noted. Abdomen: Soft, nontender, nondistended, normal bowel sounds, no masses no hepatosplenomegaly noted. No fluid wave and no ascites. There is no guarding or rebound. Neuro: Alert and oriented to person, place and time. Normal motor skills, Displays no atrophy or tremors and exhibits normal muscle tone.  No focal neurological deficits noted cranial nerves II through XII grossly intact. No sensory deficit noted.  Strength at baseline in bilateral upper and lower extremities. Gait normal. Musculoskeletal: No warm swelling or erythema around joints, no spinal tenderness noted. Psychiatric: Patient alert and oriented x3, good insight and cognition,  good recent to remote recall. Mood, memory, affect and judgement normal Skin: Skin is warm and dry. No bruising, no ecchymosis and no rash noted. Pt is not diaphoretic. No erythema. No pallor     No results for input(s): NA, K, CL, CO2, GLUCOSE, BUN, CREATININE, CALCIUM, MG, PHOS in the last 72 hours. No results for input(s): AST, ALT, ALKPHOS, BILITOT, PROT, ALBUMIN in the last 72 hours. No results for input(s): LIPASE, AMYLASE in the last 72 hours. No results for input(s): WBC, NEUTROABS, HGB, HCT, MCV, PLT in the last 72 hours.   Total time spent including face to face and decision making was greater than 30 minutes  Signed: MATTHEWS,MICHELLE A. 12/18/2015, 3:32 PM

## 2015-12-21 ENCOUNTER — Emergency Department (HOSPITAL_COMMUNITY): Payer: Medicare Other

## 2015-12-21 ENCOUNTER — Emergency Department (HOSPITAL_COMMUNITY)
Admission: EM | Admit: 2015-12-21 | Discharge: 2015-12-21 | Disposition: A | Discharge status: Home / Self Care | Payer: Medicare Other | Attending: Emergency Medicine | Admitting: Emergency Medicine

## 2015-12-21 DIAGNOSIS — Z7952 Long term (current) use of systemic steroids: Secondary | ICD-10-CM | POA: Insufficient documentation

## 2015-12-21 DIAGNOSIS — Z8679 Personal history of other diseases of the circulatory system: Secondary | ICD-10-CM | POA: Insufficient documentation

## 2015-12-21 DIAGNOSIS — R112 Nausea with vomiting, unspecified: Secondary | ICD-10-CM | POA: Diagnosis present

## 2015-12-21 DIAGNOSIS — K529 Noninfective gastroenteritis and colitis, unspecified: Secondary | ICD-10-CM

## 2015-12-21 DIAGNOSIS — R011 Cardiac murmur, unspecified: Secondary | ICD-10-CM | POA: Diagnosis not present

## 2015-12-21 DIAGNOSIS — Z87891 Personal history of nicotine dependence: Secondary | ICD-10-CM | POA: Insufficient documentation

## 2015-12-21 DIAGNOSIS — D571 Sickle-cell disease without crisis: Secondary | ICD-10-CM | POA: Insufficient documentation

## 2015-12-21 DIAGNOSIS — Z79899 Other long term (current) drug therapy: Secondary | ICD-10-CM | POA: Diagnosis not present

## 2015-12-21 LAB — URINE MICROSCOPIC-ADD ON

## 2015-12-21 LAB — CBC
HEMATOCRIT: 18.9 % — AB (ref 39.0–52.0)
HEMOGLOBIN: 6.8 g/dL — AB (ref 13.0–17.0)
MCH: 31.3 pg (ref 26.0–34.0)
MCHC: 36 g/dL (ref 30.0–36.0)
MCV: 87.1 fL (ref 78.0–100.0)
Platelets: 536 10*3/uL — ABNORMAL HIGH (ref 150–400)
RBC: 2.17 MIL/uL — AB (ref 4.22–5.81)
RDW: 23.8 % — ABNORMAL HIGH (ref 11.5–15.5)
WBC: 17.5 10*3/uL — AB (ref 4.0–10.5)

## 2015-12-21 LAB — COMPREHENSIVE METABOLIC PANEL
ALT: 36 U/L (ref 17–63)
AST: 45 U/L — ABNORMAL HIGH (ref 15–41)
Albumin: 4.2 g/dL (ref 3.5–5.0)
Alkaline Phosphatase: 100 U/L (ref 38–126)
Anion gap: 8 (ref 5–15)
BUN: 23 mg/dL — ABNORMAL HIGH (ref 6–20)
CHLORIDE: 109 mmol/L (ref 101–111)
CO2: 21 mmol/L — AB (ref 22–32)
CREATININE: 1.55 mg/dL — AB (ref 0.61–1.24)
Calcium: 8.9 mg/dL (ref 8.9–10.3)
GFR calc non Af Amer: 55 mL/min — ABNORMAL LOW (ref >60)
Glucose, Bld: 104 mg/dL — ABNORMAL HIGH (ref 65–99)
POTASSIUM: 4.3 mmol/L (ref 3.5–5.1)
SODIUM: 138 mmol/L (ref 135–145)
Total Bilirubin: 3.4 mg/dL — ABNORMAL HIGH (ref 0.3–1.2)
Total Protein: 7.6 g/dL (ref 6.5–8.1)

## 2015-12-21 LAB — URINALYSIS, ROUTINE W REFLEX MICROSCOPIC
Bilirubin Urine: NEGATIVE
Glucose, UA: NEGATIVE mg/dL
Ketones, ur: NEGATIVE mg/dL
LEUKOCYTES UA: NEGATIVE
NITRITE: NEGATIVE
PH: 6 (ref 5.0–8.0)
Protein, ur: 30 mg/dL — AB
SPECIFIC GRAVITY, URINE: 1.013 (ref 1.005–1.030)

## 2015-12-21 LAB — LIPASE, BLOOD: LIPASE: 42 U/L (ref 11–51)

## 2015-12-21 MED ORDER — ONDANSETRON HCL 4 MG PO TABS: 4.0000 mg | ORAL_TABLET | Freq: Four times a day (QID) | ORAL | Status: DC

## 2015-12-21 MED ORDER — SODIUM CHLORIDE 0.9 % IV BOLUS (SEPSIS)
1000.0000 mL | Freq: Once | INTRAVENOUS | Status: AC
  Administered 2015-12-21: 1000 mL via INTRAVENOUS

## 2015-12-21 MED ORDER — ONDANSETRON HCL 4 MG/2ML IJ SOLN
4.0000 mg | Freq: Once | INTRAMUSCULAR | Status: AC
  Administered 2015-12-21: 4 mg via INTRAVENOUS
  Filled 2015-12-21: qty 2

## 2015-12-21 NOTE — ED Notes (Signed)
Pt with Hx of sickle cell disease c/o abdominal pain, emesis, diarrhea onset last night. No pain outside of abdomen. Pt states symptoms feel unrelated to sickle cell disease.

## 2015-12-21 NOTE — Discharge Instructions (Signed)
Please read attached information. If you experience any new or worsening signs or symptoms please return to the emergency room for evaluation. Please follow-up with your primary care provider or specialist as discussed. Please use medication prescribed only as directed and discontinue taking if you have any concerning signs or symptoms.   °

## 2015-12-21 NOTE — ED Provider Notes (Signed)
CSN: [id number]     Arrival date & time 12/21/15  1406 History   First MD Initiated Contact with Patient 12/21/15 1718     Chief Complaint  Patient presents with  . Emesis    HPI   40 year old male presents today with complaints of nausea vomiting diarrhea. Patient reports that last night he had abrupt onset of generalized abdominal discomfort, hot flash, nausea, vomiting, diarrhea. Patient reports he's had several episodes throughout the evening. He describes pain in his crampy abdominal pain with no localization. He reports generalized back pain. Patient denies any exposure to abnormal food or drink, denies close sick contacts, denies any drug or alcohol use. Patient reports that he has been able to tolerate water prior to my evaluation. Patient reports symptoms appear to be improving, but are still present. Patient denies any chest pain, headache, shortness of breath, neck stiffness, swelling or edema to the extremities or joints.   Past Medical History  Diagnosis Date  . Sickle cell anemia (HCC)   . Aneurysm (HCC)     Pt states happened in 2011, Brain   . Heart murmur    Past Surgical History  Procedure Laterality Date  . Lymph node biopsy     Family History  Problem Relation Age of Onset  . Diabetes Mother    Social History  Substance Use Topics  . Smoking status: Former Games developermoker  . Smokeless tobacco: Never Used  . Alcohol Use: No     Comment: occasionally    Review of Systems  All other systems reviewed and are negative.     Allergies  Review of patient's allergies indicates no known allergies.  Home Medications   Prior to Admission medications   Medication Sig Start Date End Date Taking? Authorizing Provider  acetaminophen (TYLENOL) 500 MG tablet Take 1,000 mg by mouth every 8 (eight) hours as needed for mild pain, moderate pain, fever or headache.   Yes Historical Provider, MD  Cholecalciferol (VITAMIN D) 2000 UNITS CAPS Take 1 capsule (2,000 Units total) by  mouth daily. 01/29/15  Yes Altha HarmMichelle A Matthews, MD  famotidine (PEPCID) 20 MG tablet Take 20 mg by mouth daily.    Yes Historical Provider, MD  folic acid (FOLVITE) 1 MG tablet Take 1 mg by mouth daily.    Yes Historical Provider, MD  hydroxyurea (HYDREA) 500 MG capsule Take 1,000 mg by mouth daily. May take with food to minimize GI side effects.   Yes Historical Provider, MD  oxyCODONE-acetaminophen (PERCOCET) 10-325 MG tablet Take 1-2 tablets by mouth every 6 (six) hours as needed for pain.   Yes Historical Provider, MD  predniSONE (DELTASONE) 20 MG tablet Take 20 mg by mouth daily with breakfast.   Yes Historical Provider, MD  ondansetron (ZOFRAN) 4 MG tablet Take 1 tablet (4 mg total) by mouth every 6 (six) hours. 12/21/15   Ivyana Locey, PA-C   BP 132/69 mmHg  Pulse 104  Temp(Src) 99 F (37.2 C) (Oral)  Resp 14  SpO2 93% Physical Exam  Constitutional: He is oriented to person, place, and time. He appears well-developed and well-nourished.  HENT:  Head: Normocephalic and atraumatic.  Eyes: Conjunctivae and EOM are normal. Pupils are equal, round, and reactive to light. Right eye exhibits no discharge. Left eye exhibits no discharge. No scleral icterus.  Neck: Normal range of motion. No JVD present.  Full active pain free range of motion of the neck; supple  Cardiovascular: Normal rate, regular rhythm, normal heart sounds and intact distal  pulses.  Exam reveals no gallop and no friction rub.   No murmur heard. Pulmonary/Chest: Effort normal and breath sounds normal. No stridor. No respiratory distress. He has no wheezes. He has no rales. He exhibits no tenderness.  Nontender to palpation  Abdominal: Soft. He exhibits no distension and no mass. There is tenderness. There is no rebound and no guarding.  Very minimal tenderness to palpation of the abdomen, no focal abnormalities  Musculoskeletal: Normal range of motion.  No obvious swelling, warmth touch, signs of infection to the upper  lower extremities chest back or abdomen. Muscular compartments are soft, joints are supple with no swelling, warmth to touch, or decreased range of motion  Lymphadenopathy:    He has no cervical adenopathy.  Neurological: He is alert and oriented to person, place, and time.  Skin: Skin is warm and dry. No rash noted. He is not diaphoretic. No erythema. No pallor.  Psychiatric: He has a normal mood and affect. His behavior is normal. Judgment and thought content normal.  Nursing note and vitals reviewed.   ED Course  Procedures (including critical care time) Labs Review Labs Reviewed  COMPREHENSIVE METABOLIC PANEL - Abnormal; Notable for the following:    CO2 21 (*)    Glucose, Bld 104 (*)    BUN 23 (*)    Creatinine, Ser 1.55 (*)    AST 45 (*)    Total Bilirubin 3.4 (*)    GFR calc non Af Amer 55 (*)    All other components within normal limits  CBC - Abnormal; Notable for the following:    WBC 17.5 (*)    RBC 2.17 (*)    Hemoglobin 6.8 (*)    HCT 18.9 (*)    RDW 23.8 (*)    Platelets 536 (*)    All other components within normal limits  URINALYSIS, ROUTINE W REFLEX MICROSCOPIC (NOT AT [facility]) - Abnormal; Notable for the following:    Color, Urine AMBER (*)    Hgb urine dipstick TRACE (*)    Protein, ur 30 (*)    All other components within normal limits  URINE MICROSCOPIC-ADD ON - Abnormal; Notable for the following:    Squamous Epithelial / LPF 0-5 (*)    Bacteria, UA FEW (*)    All other components within normal limits  CULTURE, BLOOD (ROUTINE X 2)  CULTURE, BLOOD (ROUTINE X 2)  LIPASE, BLOOD    Imaging Review Dg Chest 2 View  12/21/2015  CLINICAL DATA:  Abdominal pain, back pain EXAM: CHEST  2 VIEW COMPARISON:  03/08/2015 FINDINGS: There is no focal parenchymal opacity. There is no pleural effusion or pneumothorax. The heart size is normal. There is bilateral hilar lymphadenopathy. The osseous structures are unremarkable. IMPRESSION: No active cardiopulmonary disease.  Stable bilateral hilar lymphadenopathy. Electronically Signed   By: Elige Ko   On: 12/21/2015 19:52   I have personally reviewed and evaluated these images and lab results as part of my medical decision-making.   EKG Interpretation None      MDM   Final diagnoses:  Gastroenteritis    Labs: Urinalysis, lipase, CMP and CBC  Imaging: DG chest-   Consults:  Therapeutics: Zofran, normal saline  Discharge Meds: Zofran  Assessment/Plan:Patient's presentation is most consistent with gastroenteritis. Patient's symptoms were more severe yesterday, resolving prior to my evaluation. Patient was able to tolerate by mouth prior to any anti-emetics. Patient remained afebrile while here in the ED, was in no acute distress. Patient had generalized abdominal  discomfort, no focal abnormalities noted. Patient had elevation in WBCs, but no other focal infections were noted on his exam. Patient is afebrile, nontoxic in no acute distress, blood cultures were drawn, patient will be discharged home for close follow-up with his primary care provider. Patient is instructed to return immediately to emergency room if fever returns, he symptoms return, or any other concerning signs or symptoms present. Patient verbalized understanding and agreement today's plan had no further questions or concerns at time of discharge        Eyvonne Mechanic, PA-C 12/22/15 0101  Lorre Nick, MD 12/24/15 [phone]

## 2015-12-26 LAB — CULTURE, BLOOD (ROUTINE X 2)
Culture: NO GROWTH
Culture: NO GROWTH

## 2016-01-13 ENCOUNTER — Ambulatory Visit: Payer: Medicare Other | Admitting: Family Medicine

## 2016-01-27 ENCOUNTER — Ambulatory Visit: Payer: Medicare Other | Admitting: Family Medicine

## 2016-02-05 ENCOUNTER — Ambulatory Visit: Payer: Medicare Other | Admitting: Family Medicine

## 2016-02-09 ENCOUNTER — Ambulatory Visit (INDEPENDENT_AMBULATORY_CARE_PROVIDER_SITE_OTHER): Payer: Medicare Other | Admitting: Internal Medicine

## 2016-02-09 ENCOUNTER — Encounter: Payer: Self-pay | Admitting: Internal Medicine

## 2016-02-09 VITALS — BP 116/64 | HR 93 | Temp 98.2°F | Resp 18 | Ht 72.0 in | Wt 136.0 lb

## 2016-02-09 DIAGNOSIS — N183 Chronic kidney disease, stage 3 unspecified: Secondary | ICD-10-CM

## 2016-02-09 DIAGNOSIS — G8929 Other chronic pain: Secondary | ICD-10-CM

## 2016-02-09 DIAGNOSIS — D571 Sickle-cell disease without crisis: Secondary | ICD-10-CM

## 2016-02-09 DIAGNOSIS — R011 Cardiac murmur, unspecified: Secondary | ICD-10-CM | POA: Diagnosis not present

## 2016-02-09 DIAGNOSIS — E559 Vitamin D deficiency, unspecified: Secondary | ICD-10-CM | POA: Diagnosis not present

## 2016-02-09 DIAGNOSIS — G4701 Insomnia due to medical condition: Secondary | ICD-10-CM

## 2016-02-09 LAB — COMPLETE METABOLIC PANEL WITH GFR
ALT: 26 U/L (ref 9–46)
AST: 58 U/L — ABNORMAL HIGH (ref 10–40)
Albumin: 4.4 g/dL (ref 3.6–5.1)
Alkaline Phosphatase: 121 U/L — ABNORMAL HIGH (ref 40–115)
BILIRUBIN TOTAL: 3.7 mg/dL — AB (ref 0.2–1.2)
BUN: 16 mg/dL (ref 7–25)
CALCIUM: 9.4 mg/dL (ref 8.6–10.3)
CO2: 21 mmol/L (ref 20–31)
CREATININE: 1.4 mg/dL — AB (ref 0.60–1.35)
Chloride: 106 mmol/L (ref 98–110)
GFR, EST AFRICAN AMERICAN: 73 mL/min (ref >=60)
GFR, Est Non African American: 63 mL/min (ref >=60)
Glucose, Bld: 76 mg/dL (ref 65–99)
Potassium: 5.2 mmol/L (ref 3.5–5.3)
Sodium: 137 mmol/L (ref 135–146)
TOTAL PROTEIN: 7.6 g/dL (ref 6.1–8.1)

## 2016-02-09 LAB — CBC WITH DIFFERENTIAL/PLATELET
BASOS ABS: 90 {cells}/uL (ref 0–200)
BASOS PCT: 1 %
EOS PCT: 4 %
Eosinophils Absolute: 360 cells/uL (ref 15–500)
HEMATOCRIT: 20 % — AB (ref 38.5–50.0)
HEMOGLOBIN: 7.1 g/dL — AB (ref 13.2–17.1)
LYMPHS PCT: 48 %
Lymphs Abs: 4320 cells/uL — ABNORMAL HIGH (ref 850–3900)
MCH: 32 pg (ref 27.0–33.0)
MCHC: 35.5 g/dL (ref 32.0–36.0)
MCV: 90.1 fL (ref 80.0–100.0)
MONOS PCT: 4 %
MPV: 9.1 fL (ref 7.5–12.5)
Monocytes Absolute: 360 cells/uL (ref 200–950)
NEUTROS PCT: 43 %
Neutro Abs: 3870 cells/uL (ref 1500–7800)
Platelets: 463 10*3/uL — ABNORMAL HIGH (ref 140–400)
RBC: 2.22 MIL/uL — ABNORMAL LOW (ref 4.20–5.80)
RDW: 23.5 % — ABNORMAL HIGH (ref 11.0–15.0)
WBC: 9 10*3/uL (ref 3.8–10.8)

## 2016-02-09 MED ORDER — OXYCODONE-ACETAMINOPHEN 10-325 MG PO TABS: 1.0000 | ORAL_TABLET | Freq: Four times a day (QID) | ORAL | Status: DC | PRN

## 2016-02-09 MED ORDER — HYDROXYUREA 500 MG PO CAPS: 1000.0000 mg | ORAL_CAPSULE | Freq: Every day | ORAL | Status: AC

## 2016-02-09 MED ORDER — FOLIC ACID 1 MG PO TABS: 1.0000 mg | ORAL_TABLET | Freq: Every day | ORAL | Status: DC

## 2016-02-09 MED ORDER — VITAMIN D 50 MCG (2000 UT) PO CAPS: 2000.0000 [IU] | ORAL_CAPSULE | Freq: Every day | ORAL | Status: DC

## 2016-02-09 MED ORDER — NORTRIPTYLINE HCL 10 MG PO CAPS: 10.0000 mg | ORAL_CAPSULE | Freq: Every day | ORAL | Status: DC

## 2016-02-09 NOTE — Patient Instructions (Signed)
Chronic Kidney Disease °Chronic kidney disease occurs when the kidneys are damaged over a long period. The kidneys are two organs that lie on either side of the spine between the middle of the back and the front of the abdomen. The kidneys: °· Remove wastes and extra water from the blood. °· Produce important hormones. These help keep bones strong, regulate blood pressure, and help create red blood cells. °· Balance the fluids and chemicals in the blood and tissues. °A small amount of kidney damage may not cause problems, but a large amount of damage may make it difficult or impossible for the kidneys to work the way they should. If steps are not taken to slow down the kidney damage or stop it from getting worse, the kidneys may stop working permanently. Most of the time, chronic kidney disease does not go away. However, it can often be controlled, and those with the disease can usually live normal lives. °CAUSES °The most common causes of chronic kidney disease are diabetes and high blood pressure (hypertension). Chronic kidney disease may also be caused by: °· Diseases that cause the kidneys' filters to become inflamed. °· Diseases that affect the immune system. °· Genetic diseases. °· Medicines that damage the kidneys, such as anti-inflammatory medicines. °· Poisoning or exposure to toxic substances. °· A reoccurring kidney or urinary infection. °· A problem with urine flow. This may be caused by: °· Cancer. °· Kidney stones. °· An enlarged prostate in males. °SIGNS AND SYMPTOMS °Because the kidney damage in chronic kidney disease occurs slowly, symptoms develop slowly and may not be obvious until the kidney damage becomes severe. A person may have a kidney disease for years without showing any symptoms. Symptoms can include: °· Swelling (edema) of the legs, ankles, or feet. °· Tiredness (lethargy). °· Nausea or vomiting. °· Confusion. °· Problems with urination, such as: °· Decreased urine  production. °· Frequent urination, especially at night. °· Frequent accidents in children who are potty trained. °· Muscle twitches and cramps. °· Shortness of breath. °· Weakness. °· Persistent itchiness. °· Loss of appetite. °· Metallic taste in the mouth. °· Trouble sleeping. °· Slowed development in children. °· Short stature in children. °DIAGNOSIS °Chronic kidney disease may be detected and diagnosed by tests, including blood, urine, imaging, or kidney biopsy tests. °TREATMENT °Most chronic kidney diseases cannot be cured. Treatment usually involves relieving symptoms and preventing or slowing the progression of the disease. Treatment may include: °· A special diet. You may need to avoid alcohol and foods that are salty and high in potassium. °· Medicines. These may: °· Lower blood pressure. °· Relieve anemia. °· Relieve swelling. °· Protect the bones. °HOME CARE INSTRUCTIONS °· Follow your prescribed diet.  Your health care provider may instruct you to limit daily salt (sodium) and protein intake. °· Take medicines only as directed by your health care provider. Do not take any new medicines (prescription, over-the-counter, or nutritional supplements) unless approved by your health care provider. Many medicines can worsen your kidney damage or need to have the dose adjusted.   °· Quit smoking if you smoke. Talk to your health care provider about a smoking cessation program. °· Keep all follow-up visits as directed by your health care provider. °· Monitor your blood pressure. °· Start or continue an exercise plan. °· Get immunizations as directed by your health care provider. °· Take vitamin and mineral supplements as directed by your health care provider. °SEEK IMMEDIATE MEDICAL CARE IF: °· Your symptoms get worse or you develop   new symptoms.  You develop symptoms of end-stage kidney disease. These include:  Headaches.  Abnormally dark or light skin.  Numbness in the hands or feet.  Easy  bruising.  Frequent hiccups.  Menstruation stops.  You have a fever.  You have decreased urine production.  You havepain or bleeding when urinating. MAKE SURE YOU:  Understand these instructions.  Will watch your condition.  Will get help right away if you are not doing well or get worse. FOR MORE INFORMATION   American Association of Kidney Patients: ResidentialShow.is  National Kidney Foundation: www.kidney.org  American Kidney Fund: FightingMatch.com.ee  [employer] [facility]: www.lifeoptions.org and www.kidneyschool.org   This information is not intended to replace advice given to you by your health care provider. Make sure you discuss any questions you have with your health care provider.   Document Released: 06/21/2008 Document Revised: 10/03/2014 Document Reviewed: 05/11/2012 Elsevier Interactive Patient Education 2016 Elsevier Inc. Sickle Cell Anemia, Adult Sickle cell anemia is a condition in which red blood cells have an abnormal "sickle" shape. This abnormal shape shortens the cells' life span, which results in a lower than normal concentration of red blood cells in the blood. The sickle shape also causes the cells to clump together and block free blood flow through the blood vessels. As a result, the tissues and organs of the body do not receive enough oxygen. Sickle cell anemia causes organ damage and pain and increases the risk of infection. CAUSES  Sickle cell anemia is a genetic disorder. Those who receive two copies of the gene have the condition, and those who receive one copy have the trait. RISK FACTORS The sickle cell gene is most common in people whose families originated in [place]. Other areas of the globe where sickle cell trait occurs include the [place], [place] and [place], the [place], and the [place].  SIGNS AND SYMPTOMS  Pain, especially in the extremities, back, chest, or abdomen (common). The pain may start suddenly or may  develop following an illness, especially if there is dehydration. Pain can also occur due to overexertion or exposure to extreme temperature changes.  Frequent severe bacterial infections, especially certain types of pneumonia and meningitis.  Pain and swelling in the hands and feet.  Decreased activity.   Loss of appetite.   Change in behavior.  Headaches.  Seizures.  Shortness of breath or difficulty breathing.  Vision changes.  Skin ulcers. Those with the trait may not have symptoms or they may have mild symptoms.  DIAGNOSIS  Sickle cell anemia is diagnosed with blood tests that demonstrate the genetic trait. It is often diagnosed during the newborn period, due to mandatory testing nationwide. A variety of blood tests, X-rays, CT scans, MRI scans, ultrasounds, and lung function tests may also be done to monitor the condition. TREATMENT  Sickle cell anemia may be treated with:  Medicines. You may be given pain medicines, antibiotic medicines (to treat and prevent infections) or medicines to increase the production of certain types of hemoglobin.  Fluids.  Oxygen.  Blood transfusions. HOME CARE INSTRUCTIONS   Drink enough fluid to keep your urine clear or pale yellow. Increase your fluid intake in hot weather and during exercise.  Do not smoke. Smoking lowers oxygen levels in the blood.   Only take over-the-counter or prescription medicines for pain, fever, or discomfort as directed by your health care provider.  Take antibiotics as directed by your health care provider. Make sure you finish them it even if you start  to feel better.   Take supplements as directed by your health care provider.   Consider wearing a medical alert bracelet. This tells anyone caring for you in an emergency of your condition.   When traveling, keep your medical information, health care provider's names, and the medicines you take with you at all times.   If you develop a fever, do  not take medicines to reduce the fever right away. This could cover up a problem that is developing. Notify your health care provider.  Keep all follow-up appointments with your health care provider. Sickle cell anemia requires regular medical care. SEEK MEDICAL CARE IF: You have a fever. SEEK IMMEDIATE MEDICAL CARE IF:   You feel dizzy or faint.   You have new abdominal pain, especially on the left side near the stomach area.   You develop a persistent, often uncomfortable and painful penile erection (priapism). If this is not treated immediately it will lead to impotence.   You have numbness your arms or legs or you have a hard time moving them.   You have a hard time with speech.   You have a fever or persistent symptoms for more than 2-3 days.   You have a fever and your symptoms suddenly get worse.   You have signs or symptoms of infection. These include:   Chills.   Abnormal tiredness (lethargy).   Irritability.   Poor eating.   Vomiting.   You develop pain that is not helped with medicine.   You develop shortness of breath.  You have pain in your chest.   You are coughing up pus-like or bloody sputum.   You develop a stiff neck.  Your feet or hands swell or have pain.  Your abdomen appears bloated.  You develop joint pain. MAKE SURE YOU:  Understand these instructions.   This information is not intended to replace advice given to you by your health care provider. Make sure you discuss any questions you have with your health care provider.   Document Released: 12/21/2005 Document Revised: 10/03/2014 Document Reviewed: 04/24/2013 Elsevier Interactive Patient Education [employer]2016 Elsevier Inc.

## 2016-02-09 NOTE — Progress Notes (Signed)
Patient ID: Shawn Adams Liebler, male   DOB: 06/04/1976, 40 y.o.   MRN: [id number]   Shawn Adams Merlos, is a 40 y.o. male  ION:[id number]  [id number]  DOB - 06/04/1976  No chief complaint on file.       Subjective:   Shawn Adams Hepner is a 40 y.o. male sickle cell anemia, CKD III and heart murmur here today for a routine follow up visit and medication refills. Patient is complaining of insomnia, sometimes works night shift, and when he gets home in the morning he is unable to sleep for more than 2-3 hours. He claims he is doing well with his sickle cell disease. He needs refills on his pain medications today. He used to have headaches from his brain aneurysm but not lately. [facility] but not in the recent past. Patient has No headache, No chest pain, No abdominal pain - No Nausea, No new weakness tingling or numbness, No Cough - SOB. He denies depression. Not sure if he snores, no daytime excessive sleepiness.   Problem  Vitamin D Deficiency  Heart Murmur    ALLERGIES: No Known Allergies  PAST MEDICAL HISTORY: Past Medical History  Diagnosis Date  . Sickle cell anemia (HCC)   . Aneurysm (HCC)     Pt states happened in 2011, Brain   . Heart murmur     MEDICATIONS AT HOME: Prior to Admission medications   Medication Sig Start Date End Date Taking? Authorizing Provider  acetaminophen (TYLENOL) 500 MG tablet Take 1,000 mg by mouth every 8 (eight) hours as needed for mild pain, moderate pain, fever or headache.   Yes Historical Provider, MD  Cholecalciferol (VITAMIN D) 2000 units CAPS Take 1 capsule (2,000 Units total) by mouth daily. 02/09/16  Yes Quentin Angstlugbemiga E Blossom Crume, MD  famotidine (PEPCID) 20 MG tablet Take 20 mg by mouth daily.    Yes Historical Provider, MD  folic acid (FOLVITE) 1 MG tablet Take 1 tablet (1 mg total) by mouth daily. 02/09/16  Yes Quentin Angstlugbemiga E Loreal Schuessler, MD  hydroxyurea (HYDREA) 500 MG capsule Take 2 capsules  (1,000 mg total) by mouth daily. May take with food to minimize GI side effects. 02/09/16  Yes Quentin Angstlugbemiga E Issam Carlyon, MD  ondansetron (ZOFRAN) 4 MG tablet Take 1 tablet (4 mg total) by mouth every 6 (six) hours. 12/21/15  Yes Jeffrey Hedges, PA-C  oxyCODONE-acetaminophen (PERCOCET) 10-325 MG tablet Take 1-2 tablets by mouth every 6 (six) hours as needed for pain. 02/09/16  Yes Quentin Angstlugbemiga E Dvontae Ruan, MD  predniSONE (DELTASONE) 20 MG tablet Take 20 mg by mouth daily with breakfast. Reported on 02/09/2016    Historical Provider, MD     Objective:   Filed Vitals:   02/09/16 1131  BP: 116/64  Pulse: 93  Temp: 98.2 F (36.8 C)  TempSrc: Oral  Resp: 18  Height: 6' (1.829 m)  Weight: 136 lb (61.689 kg)  SpO2: 93%    Exam General appearance : Awake, alert, not in any distress. Speech Clear. Not toxic looking HEENT: Atraumatic and Normocephalic, pupils equally reactive to light and accomodation Neck: supple, no JVD. No cervical lymphadenopathy.  Chest:Good air entry bilaterally, no added sounds  CVS: S1 S2 regular, grade 2/6 systolic murmurs.  Abdomen: Bowel sounds present, Non tender and not distended with no gaurding, rigidity or rebound. Extremities: B/L Lower Ext shows no edema, both legs are warm to touch Neurology: Awake alert, and oriented X 3, CN II-XII intact, Non focal  Skin: No Rash  Data Review No results found for: HGBA1C   Assessment & Plan   1. Hb-SS disease without crisis (HCC)  - oxyCODONE-acetaminophen (PERCOCET) 10-325 MG tablet; Take 1-2 tablets by mouth every 6 (six) hours as needed for pain.  Dispense: 90 tablet; Refill: 0 - hydroxyurea (HYDREA) 500 MG capsule; Take 2 capsules (1,000 mg total) by mouth daily. May take with food to minimize GI side effects.  Dispense: 180 capsule; Refill: 3 - folic acid (FOLVITE) 1 MG tablet; Take 1 tablet (1 mg total) by mouth daily.  Dispense: 90 tablet; Refill: 3 - CBC with Differential/Platelet  Sickle cell disease - Continue  Hydrea. We discussed the need for good hydration, monitoring of hydration status, avoidance of heat, cold, stress, and infection triggers. We discussed the risks and benefits of Hydrea, including bone marrow suppression, the possibility of GI upset, skin ulcers, hair thinning, and teratogenicity. The patient was reminded of the need to seek medical attention of any symptoms of bleeding, anemia, or infection. Continue folic acid 1 mg daily to prevent aplastic bone marrow crises.   Acute and chronic painful episodes - We agreed on Opiate dose and amount of pills  per month. We discussed that pt is to receive Schedule II prescriptions only from our clinic. Pt is also aware that the prescription history is available to [place] online through the [facility] CSRS. Controlled substance agreement reviewed and signed. We reminded Maurizio that all patients receiving Schedule II narcotics must be seen for follow within one month of prescription being requested. We reviewed the terms of our pain agreement, including the need to keep medicines in a safe locked location away from children or pets, and the need to report excess sedation or constipation, measures to avoid constipation, and policies related to early refills and stolen prescriptions. According to the [place] [facility] program, we have reviewed details related to analgesia, adverse effects and aberrant behaviors.  2. CKD (chronic kidney disease) stage 3, GFR 30-59 ml/min  - Avoid NSAID - COMPLETE METABOLIC PANEL WITH GFR - Urinalysis, Complete  3. Vitamin D deficiency  - Cholecalciferol (VITAMIN D) 2000 units CAPS; Take 1 capsule (2,000 Units total) by mouth daily.  Dispense: 90 capsule; Refill: 3 - VITAMIN D 25 Hydroxy (Vit-D Deficiency, Fractures)  4. Heart murmur  - Echocardiogram; Future  Patient have been counseled extensively about nutrition and exercise  Return in about 3 months (around 05/11/2016) for Sickle Cell Disease/Pain, CKD/ESRD.  The  patient was given clear instructions to go to ER or return to medical center if symptoms don't improve, worsen or new problems develop. The patient verbalized understanding. The patient was told to call to get lab results if they haven't heard anything in the next week.   This note has been created with Education officer, environmental. Any transcriptional errors are unintentional.    Jeanann Lewandowsky, MD, MHA, FACP, FAAP, CPE [facility] [place], [place] [phone]   02/09/2016, 12:13 PM

## 2016-02-09 NOTE — Progress Notes (Signed)
Patient is here for 6 month FU  Patient denies pain at this time.  Patient has not taken medication and patient has eaten.

## 2016-02-10 LAB — URINALYSIS, COMPLETE
Bacteria, UA: NONE SEEN [HPF]
Bilirubin Urine: NEGATIVE
CASTS: NONE SEEN [LPF]
CRYSTALS: NONE SEEN [HPF]
Glucose, UA: NEGATIVE
Ketones, ur: NEGATIVE
Leukocytes, UA: NEGATIVE
NITRITE: NEGATIVE
PH: 6 (ref 5.0–8.0)
SPECIFIC GRAVITY, URINE: 1.011 (ref 1.001–1.035)
SQUAMOUS EPITHELIAL / LPF: NONE SEEN [HPF] (ref <=5)
YEAST: NONE SEEN [HPF]

## 2016-02-10 LAB — VITAMIN D 25 HYDROXY (VIT D DEFICIENCY, FRACTURES): Vit D, 25-Hydroxy: 23 ng/mL — ABNORMAL LOW (ref 30–100)

## 2016-02-23 ENCOUNTER — Other Ambulatory Visit: Payer: Self-pay | Admitting: *Deleted

## 2016-02-23 DIAGNOSIS — N183 Chronic kidney disease, stage 3 unspecified: Secondary | ICD-10-CM

## 2016-03-01 ENCOUNTER — Telehealth: Payer: Self-pay | Admitting: *Deleted

## 2016-03-01 NOTE — Telephone Encounter (Signed)
-----   Message from Quentin Angstlugbemiga E [name], MD sent at 02/17/2016  1:19 PM EDT ----- Please inform patient that hemoglobin and kidney functions are stable. No change in management for now.

## 2016-03-01 NOTE — Telephone Encounter (Signed)
Patient verified DOB Patient is aware of hemoglobin and kidney functions being stable with no change to management now, Patient expressed understanding and had no further question at this time.

## 2016-03-04 ENCOUNTER — Other Ambulatory Visit: Payer: Self-pay | Admitting: Internal Medicine

## 2016-03-04 DIAGNOSIS — R519 Headache, unspecified: Secondary | ICD-10-CM

## 2016-03-04 DIAGNOSIS — R51 Headache: Principal | ICD-10-CM

## 2016-04-27 ENCOUNTER — Other Ambulatory Visit: Payer: Self-pay | Admitting: Nephrology

## 2016-04-27 DIAGNOSIS — N183 Chronic kidney disease, stage 3 (moderate): Secondary | ICD-10-CM

## 2016-04-28 ENCOUNTER — Ambulatory Visit
Admission: RE | Admit: 2016-04-28 | Discharge: 2016-04-28 | Disposition: A | Discharge status: Home / Self Care | Payer: Medicare Other | Source: Ambulatory Visit | Attending: Nephrology | Admitting: Nephrology

## 2016-04-28 DIAGNOSIS — N183 Chronic kidney disease, stage 3 (moderate): Secondary | ICD-10-CM

## 2016-05-03 ENCOUNTER — Observation Stay (HOSPITAL_COMMUNITY)
Admission: EM | Admit: 2016-05-03 | Discharge: 2016-05-04 | Disposition: A | Discharge status: Home / Self Care | Payer: Medicare Other | Attending: Internal Medicine | Admitting: Internal Medicine

## 2016-05-03 ENCOUNTER — Encounter (HOSPITAL_COMMUNITY): Payer: Self-pay | Admitting: Emergency Medicine

## 2016-05-03 DIAGNOSIS — N182 Chronic kidney disease, stage 2 (mild): Secondary | ICD-10-CM | POA: Diagnosis present

## 2016-05-03 DIAGNOSIS — Z87891 Personal history of nicotine dependence: Secondary | ICD-10-CM | POA: Diagnosis not present

## 2016-05-03 DIAGNOSIS — D638 Anemia in other chronic diseases classified elsewhere: Secondary | ICD-10-CM | POA: Diagnosis present

## 2016-05-03 DIAGNOSIS — E559 Vitamin D deficiency, unspecified: Secondary | ICD-10-CM | POA: Diagnosis not present

## 2016-05-03 DIAGNOSIS — D649 Anemia, unspecified: Principal | ICD-10-CM | POA: Insufficient documentation

## 2016-05-03 DIAGNOSIS — D869 Sarcoidosis, unspecified: Secondary | ICD-10-CM | POA: Insufficient documentation

## 2016-05-03 DIAGNOSIS — R51 Headache: Secondary | ICD-10-CM | POA: Insufficient documentation

## 2016-05-03 DIAGNOSIS — N183 Chronic kidney disease, stage 3 (moderate): Secondary | ICD-10-CM | POA: Diagnosis not present

## 2016-05-03 DIAGNOSIS — R519 Headache, unspecified: Secondary | ICD-10-CM | POA: Diagnosis present

## 2016-05-03 DIAGNOSIS — D571 Sickle-cell disease without crisis: Secondary | ICD-10-CM | POA: Insufficient documentation

## 2016-05-03 DIAGNOSIS — Z79899 Other long term (current) drug therapy: Secondary | ICD-10-CM | POA: Insufficient documentation

## 2016-05-03 DIAGNOSIS — N179 Acute kidney failure, unspecified: Secondary | ICD-10-CM | POA: Diagnosis present

## 2016-05-03 HISTORY — DX: Sarcoidosis, unspecified: D86.9

## 2016-05-03 LAB — COMPREHENSIVE METABOLIC PANEL
ALBUMIN: 3.8 g/dL (ref 3.5–5.0)
ALT: 49 U/L (ref 17–63)
ANION GAP: 6 (ref 5–15)
AST: 86 U/L — ABNORMAL HIGH (ref 15–41)
Alkaline Phosphatase: 135 U/L — ABNORMAL HIGH (ref 38–126)
BILIRUBIN TOTAL: 3.6 mg/dL — AB (ref 0.3–1.2)
BUN: 22 mg/dL — ABNORMAL HIGH (ref 6–20)
CHLORIDE: 110 mmol/L (ref 101–111)
CO2: 19 mmol/L — ABNORMAL LOW (ref 22–32)
Calcium: 8.9 mg/dL (ref 8.9–10.3)
Creatinine, Ser: 1.62 mg/dL — ABNORMAL HIGH (ref 0.61–1.24)
GFR calc Af Amer: >60 mL/min (ref >60)
GFR, EST NON AFRICAN AMERICAN: 52 mL/min — AB (ref >60)
Glucose, Bld: 112 mg/dL — ABNORMAL HIGH (ref 65–99)
POTASSIUM: 4.4 mmol/L (ref 3.5–5.1)
Sodium: 135 mmol/L (ref 135–145)
TOTAL PROTEIN: 7.1 g/dL (ref 6.5–8.1)

## 2016-05-03 LAB — CBC WITH DIFFERENTIAL/PLATELET
BASOS ABS: 0.1 10*3/uL (ref 0.0–0.1)
BLASTS: 0 %
Band Neutrophils: 0 %
Basophils Relative: 1 %
EOS ABS: 0.1 10*3/uL (ref 0.0–0.7)
Eosinophils Relative: 1 %
HEMATOCRIT: 14.7 % — AB (ref 39.0–52.0)
Hemoglobin: 5.3 g/dL — CL (ref 13.0–17.0)
LYMPHS ABS: 4.1 10*3/uL — AB (ref 0.7–4.0)
LYMPHS PCT: 59 %
MCH: 34.2 pg — ABNORMAL HIGH (ref 26.0–34.0)
MCHC: 36.1 g/dL — AB (ref 30.0–36.0)
MCV: 94.8 fL (ref 78.0–100.0)
MONOS PCT: 7 %
Metamyelocytes Relative: 0 %
Monocytes Absolute: 0.5 10*3/uL (ref 0.1–1.0)
Myelocytes: 0 %
NEUTROS ABS: 2.3 10*3/uL (ref 1.7–7.7)
NRBC: 6 /100{WBCs} — AB
Neutrophils Relative %: 32 %
OTHER: 0 %
PLATELETS: 355 10*3/uL (ref 150–400)
Promyelocytes Absolute: 0 %
RBC: 1.55 MIL/uL — AB (ref 4.22–5.81)
RDW: 25.1 % — AB (ref 11.5–15.5)
WBC: 7.1 10*3/uL (ref 4.0–10.5)

## 2016-05-03 LAB — PREPARE RBC (CROSSMATCH)

## 2016-05-03 MED ORDER — DIPHENHYDRAMINE HCL 50 MG/ML IJ SOLN: 25.0000 mg | Freq: Four times a day (QID) | INTRAMUSCULAR | Status: DC | PRN

## 2016-05-03 MED ORDER — HYDROMORPHONE HCL 1 MG/ML IJ SOLN: 1.0000 mg | INTRAMUSCULAR | Status: DC | PRN

## 2016-05-03 MED ORDER — HYDROXYUREA 500 MG PO CAPS
1000.0000 mg | ORAL_CAPSULE | Freq: Every day | ORAL | Status: DC
  Administered 2016-05-04: 1000 mg via ORAL
  Filled 2016-05-03: qty 2

## 2016-05-03 MED ORDER — OXYCODONE-ACETAMINOPHEN 5-325 MG PO TABS: 1.0000 | ORAL_TABLET | Freq: Four times a day (QID) | ORAL | Status: DC | PRN

## 2016-05-03 MED ORDER — KETOROLAC TROMETHAMINE 60 MG/2ML IM SOLN
60.0000 mg | Freq: Once | INTRAMUSCULAR | Status: AC
  Administered 2016-05-03: 60 mg via INTRAMUSCULAR
  Filled 2016-05-03: qty 2

## 2016-05-03 MED ORDER — OXYCODONE-ACETAMINOPHEN 10-325 MG PO TABS: 1.0000 | ORAL_TABLET | Freq: Four times a day (QID) | ORAL | Status: DC | PRN

## 2016-05-03 MED ORDER — ACETAMINOPHEN 500 MG PO TABS
1000.0000 mg | ORAL_TABLET | Freq: Three times a day (TID) | ORAL | Status: DC | PRN
  Administered 2016-05-04: 1000 mg via ORAL
  Filled 2016-05-03: qty 2

## 2016-05-03 MED ORDER — ONDANSETRON HCL 4 MG PO TABS
4.0000 mg | ORAL_TABLET | Freq: Four times a day (QID) | ORAL | Status: DC
  Administered 2016-05-04 (×3): 4 mg via ORAL
  Filled 2016-05-03 (×3): qty 1

## 2016-05-03 MED ORDER — SODIUM CHLORIDE 0.9 % IV SOLN
Freq: Once | INTRAVENOUS | Status: DC
Start: 2016-05-03 — End: 2016-05-04

## 2016-05-03 MED ORDER — SODIUM CHLORIDE 0.9 % IV SOLN
INTRAVENOUS | Status: AC
  Administered 2016-05-04: 1000 mL via INTRAVENOUS

## 2016-05-03 MED ORDER — OXYCODONE HCL 5 MG PO TABS
5.0000 mg | ORAL_TABLET | Freq: Four times a day (QID) | ORAL | Status: DC | PRN
  Administered 2016-05-04: 10 mg via ORAL
  Filled 2016-05-03: qty 2

## 2016-05-03 MED ORDER — ENSURE ENLIVE PO LIQD
237.0000 mL | Freq: Two times a day (BID) | ORAL | Status: DC
  Administered 2016-05-04: 237 mL via ORAL

## 2016-05-03 MED ORDER — OXYCODONE-ACETAMINOPHEN 5-325 MG PO TABS
2.0000 | ORAL_TABLET | Freq: Once | ORAL | Status: AC
  Administered 2016-05-03: 2 via ORAL
  Filled 2016-05-03: qty 2

## 2016-05-03 MED ORDER — SODIUM CHLORIDE 0.9 % IV BOLUS (SEPSIS)
1000.0000 mL | Freq: Once | INTRAVENOUS | Status: AC
  Administered 2016-05-03: 1000 mL via INTRAVENOUS

## 2016-05-03 MED ORDER — FAMOTIDINE 20 MG PO TABS
20.0000 mg | ORAL_TABLET | Freq: Every day | ORAL | Status: DC
  Administered 2016-05-04: 20 mg via ORAL
  Filled 2016-05-03 (×2): qty 1

## 2016-05-03 MED ORDER — FOLIC ACID 1 MG PO TABS
1.0000 mg | ORAL_TABLET | Freq: Every day | ORAL | Status: DC
  Administered 2016-05-04: 1 mg via ORAL
  Filled 2016-05-03: qty 1

## 2016-05-03 NOTE — H&P (Signed)
TRH H&P   Patient Demographics:    Shawn DollarLuke Adams, is a 40 y.o. male  MRN: [id number]   DOB - May 14, 1976  Admit Date - 05/03/2016  Outpatient Primary MD for the patient is Jeanann LewandowskyJEGEDE, OLUGBEMIGA, MD  Referring MD/NP/PA: Loren Raceravid Yelverton  Outpatient Specialists: Elvis CoilMartin Webb (nephrologist)  Patient coming from: home  Chief Complaint  Patient presents with  . Headache      HPI:    Shawn Adams  is a 40 y.o. male, w sickle cell , brain aneurysm has c/o headache, frontal since last nite.  Pt was noted to be severely anemic, and will be admitted for headache and transfusion.     Review of systems:    In addition to the HPI above,  No Fever-chills, No changes with Vision or hearing, No problems swallowing food or Liquids, No Chest pain, Cough.  +slight Shortness of Breath, No Abdominal pain, No Nausea or Vommitting, Bowel movements are regular, No Blood in stool or Urine, No dysuria, No new skin rashes or bruises, No new joints pains-aches,  No new weakness, tingling, numbness in any extremity, No recent weight gain or loss, No polyuria, polydypsia or polyphagia, No significant Mental Stressors.  A full 10 point Review of Systems was done, except as stated above, all other Review of Systems were negative.   With Past History of the following :    Past Medical History:  Diagnosis Date  . Aneurysm (HCC)    Pt states happened in 2011, Brain   . Heart murmur   . Sarcoid (HCC)   . Sickle cell anemia (HCC)       Past Surgical History:  Procedure Laterality Date  . LYMPH NODE BIOPSY        Social History:     Social History  Substance Use Topics  . Smoking status: Former Games developermoker  . Smokeless tobacco: Never Used  . Alcohol use No     Comment: occasionally     Lives - at home  Mobility - ambulates without assistance     Family History :     Family  History  Problem Relation Age of Onset  . Diabetes Mother   . Hypertension Father   . Lupus Sister       Home Medications:   Prior to Admission medications   Medication Sig Start Date End Date Taking? Authorizing Provider  acetaminophen (TYLENOL) 500 MG tablet Take 1,000 mg by mouth every 8 (eight) hours as needed for mild pain, moderate pain, fever or headache.   Yes Historical Provider, MD  famotidine (PEPCID) 20 MG tablet Take 20 mg by mouth daily.    Yes Historical Provider, MD  folic acid (FOLVITE) 1 MG tablet Take 1 tablet (1 mg total) by mouth daily. 02/09/16  Yes Quentin Angstlugbemiga E [name], MD  hydroxyurea (HYDREA) 500 MG capsule Take 2 capsules (1,000 mg total) by mouth daily.  May take with food to minimize GI side effects. 02/09/16  Yes Quentin Angst, MD  ondansetron (ZOFRAN) 4 MG tablet Take 1 tablet (4 mg total) by mouth every 6 (six) hours. 12/21/15  Yes Jeffrey Hedges, PA-C  oxyCODONE-acetaminophen (PERCOCET) 10-325 MG tablet Take 1-2 tablets by mouth every 6 (six) hours as needed for pain. 02/09/16  Yes Quentin Angst, MD  Cholecalciferol (VITAMIN D) 2000 units CAPS Take 1 capsule (2,000 Units total) by mouth daily. Patient not taking: Reported on 05/03/2016 02/09/16   Quentin Angst, MD  nortriptyline (PAMELOR) 10 MG capsule Take 1 capsule (10 mg total) by mouth at bedtime. Patient not taking: Reported on 05/03/2016 02/09/16   Quentin Angst, MD     Allergies:    No Known Allergies   Physical Exam:   Vitals  Blood pressure (!) 111/52, pulse (!) 101, temperature 98.6 F (37 C), temperature source Oral, resp. rate 16, height 6' (1.829 m), weight 60.1 kg (132 lb 9.6 oz), SpO2 94 %.   1. General  lying in bed in NAD,    2. Normal affect and insight, Not Suicidal or Homicidal, Awake Alert, Oriented X 3.  3. No F.N deficits, ALL C.Nerves Intact, Strength 5/5 all 4 extremities, Sensation intact all 4 extremities, Plantars down going.  4. Ears and Eyes appear  Normal, Conjunctivae clear, PERRLA. Moist Oral Mucosa. Pale conjunctiva  5. Supple Neck, No JVD, No cervical lymphadenopathy appriciated, No Carotid Bruits.  6. Symmetrical Chest wall movement, Good air movement bilaterally, CTAB.  7. RRR, No Gallops, Rubs or Murmurs, No Parasternal Heave.  8. Positive Bowel Sounds, Abdomen Soft, No tenderness, No organomegaly appriciated,No rebound -guarding or rigidity.  9.  No Cyanosis, Normal Skin Turgor, No Skin Rash or Bruise.  10. Good muscle tone,  joints appear normal , no effusions, Normal ROM.  11. No Palpable Lymph Nodes in Neck or Axillae     Data Review:    CBC  Recent Labs Lab 05/03/16 1605  WBC 7.1  HGB 5.3*  HCT 14.7*  PLT 355  MCV 94.8  MCH 34.2*  MCHC 36.1*  RDW 25.1*  LYMPHSABS 4.1*  MONOABS 0.5  EOSABS 0.1  BASOSABS 0.1   ------------------------------------------------------------------------------------------------------------------  Chemistries   Recent Labs Lab 05/03/16 1605  NA 135  K 4.4  CL 110  CO2 19*  GLUCOSE 112*  BUN 22*  CREATININE 1.62*  CALCIUM 8.9  AST 86*  ALT 49  ALKPHOS 135*  BILITOT 3.6*   ------------------------------------------------------------------------------------------------------------------ estimated creatinine clearance is 52 mL/min (by C-G formula based on SCr of 1.62 mg/dL). ------------------------------------------------------------------------------------------------------------------ No results for input(s): TSH, T4TOTAL, T3FREE, THYROIDAB in the last 72 hours.  Invalid input(s): FREET3  Coagulation profile No results for input(s): INR, PROTIME in the last 168 hours. ------------------------------------------------------------------------------------------------------------------- No results for input(s): DDIMER in the last 72  hours. -------------------------------------------------------------------------------------------------------------------  Cardiac Enzymes No results for input(s): CKMB, TROPONINI, MYOGLOBIN in the last 168 hours.  Invalid input(s): CK ------------------------------------------------------------------------------------------------------------------ No results found for: BNP   ---------------------------------------------------------------------------------------------------------------  Urinalysis    Component Value Date/Time   COLORURINE DARK YELLOW 02/09/2016 1218   APPEARANCEUR CLEAR 02/09/2016 1218   LABSPEC 1.011 02/09/2016 1218   PHURINE 6.0 02/09/2016 1218   GLUCOSEU NEGATIVE 02/09/2016 1218   HGBUR 2+ (A) 02/09/2016 1218   BILIRUBINUR NEGATIVE 02/09/2016 1218   KETONESUR NEGATIVE 02/09/2016 1218   PROTEINUR 1+ (A) 02/09/2016 1218   UROBILINOGEN 0.2 10/13/2015 1004   NITRITE NEGATIVE 02/09/2016 1218   LEUKOCYTESUR NEGATIVE 02/09/2016  1218    ----------------------------------------------------------------------------------------------------------------   Imaging Results:    No results found.   Assessment & Plan:    Principal Problem:   Anemia Active Problems:   CKD (chronic kidney disease) stage 3, GFR 30-59 ml/min   Headache    Headache, in light of history of aneurysm Check CT brain  Anemia Symptomatic,  Transfuse with 2 units prbc  ckd stage 3, Check cbc, cmp in am  Sarcoidosis stable   DVT Prophylaxis early ambuation- SCDs   AM Labs Ordered, also please review Full Orders  Family Communication: Admission, patients condition and plan of care including tests being ordered have been discussed with the patient who indicate understanding and agree with the plan and Code Status.  Code Status FULL CODE  Likely DC to  home  Condition GUARDED    Consults called:   Admission status: observation  Time spent in minutes : 45 minutes   Pearson Grippe M.D on 05/03/2016 at 10:22 PM  Between 7am to 7pm - Pager - [phone]  After 7pm go to [email or web address] - password [facility]  [facility] - Office  [phone]

## 2016-05-03 NOTE — ED Notes (Signed)
Blood lab called, informing RN that pt's blood will take longer to process.

## 2016-05-03 NOTE — ED Notes (Signed)
Dinner try arrived

## 2016-05-03 NOTE — ED Provider Notes (Signed)
[facility]-EMERGENCY DEPT Provider Note   CSN: [id number] Arrival date & time: 05/03/16  1042  First Provider Contact:  None       History   Chief Complaint Chief Complaint  Patient presents with  . Headache    HPI Mariam DollarLuke Snowdon is a 40 y.o. male.  HPI Patient with history of chronic headaches for which he's recently been seen by neurology presents with headache starting yesterday evening around 8 PM. Gradual onset. Located mostly in the frontal region. Described as bilateral associated with mild photophobia. No nausea or vomiting. Denies any focal weakness or numbness. No visual changes. States the headache is similar to his past headaches. Normally takes Tylenol or Percocet for his headaches. States he ran out of his Percocet 2 days ago. He took 800 mg ibuprofen yesterday evening. He's had no fever or chills. He does endorse nasal congestion and sinus pressure. History of posterior cerebral aneurysm diagnosed in 2011. Past Medical History:  Diagnosis Date  . Aneurysm (HCC)    Pt states happened in 2011, Brain   . Heart murmur   . Sickle cell anemia [facility](HCC)     Patient Active Problem List   Diagnosis Date Noted  . Headache 05/03/2016  . Anemia 05/03/2016  . Vitamin D deficiency 02/09/2016  . Heart murmur 02/09/2016  . Recurrent occipital headache 10/15/2015  . History of aneurysm 10/13/2015  . Left hip pain 07/30/2015  . Left ankle pain 07/08/2015  . Sickle cell crisis (HCC) 03/08/2015  . 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 Sickle cell anemia with crisis (HCC) 12/07/2014  . Sickle cell anemia (HCC) 06/20/2014  . CKD (chronic kidney disease) stage 3, GFR 30-59 ml/min 06/20/2014  . Leukocytosis 06/20/2014  . Sickle cell pain crisis (HCC) 06/20/2014  . Hypercalcemia 06/20/2014  . Symptomatic anemia 05/31/2014    Past Surgical History:  Procedure Laterality Date  . LYMPH NODE BIOPSY         Home Medications    Prior to Admission medications   Medication  Sig Start Date End Date Taking? Authorizing Provider  acetaminophen (TYLENOL) 500 MG tablet Take 1,000 mg by mouth every 8 (eight) hours as needed for mild pain, moderate pain, fever or headache.   Yes Historical Provider, MD  famotidine (PEPCID) 20 MG tablet Take 20 mg by mouth daily.    Yes Historical Provider, MD  folic acid (FOLVITE) 1 MG tablet Take 1 tablet (1 mg total) by mouth daily. 02/09/16  Yes Quentin Angstlugbemiga E [name], MD  hydroxyurea (HYDREA) 500 MG capsule Take 2 capsules (1,000 mg total) by mouth daily. May take with food to minimize GI side effects. 02/09/16  Yes Quentin Angstlugbemiga E [name], MD  ondansetron (ZOFRAN) 4 MG tablet Take 1 tablet (4 mg total) by mouth every 6 (six) hours. 12/21/15  Yes Jeffrey Hedges, PA-C  oxyCODONE-acetaminophen (PERCOCET) 10-325 MG tablet Take 1-2 tablets by mouth every 6 (six) hours as needed for pain. 02/09/16  Yes Quentin Angstlugbemiga E [name], MD  Cholecalciferol (VITAMIN D) 2000 units CAPS Take 1 capsule (2,000 Units total) by mouth daily. Patient not taking: Reported on 05/03/2016 02/09/16   Quentin Angstlugbemiga E [name], MD  nortriptyline (PAMELOR) 10 MG capsule Take 1 capsule (10 mg total) by mouth at bedtime. Patient not taking: Reported on 05/03/2016 02/09/16   Quentin Angstlugbemiga E [name], MD    Family History Family History  Problem Relation Age of Onset  . Diabetes Mother     Social History Social History  Substance Use Topics  . Smoking  status: Former Games developer  . Smokeless tobacco: Never Used  . Alcohol use No     Comment: occasionally     Allergies   Review of patient's allergies indicates no known allergies.   Review of Systems Review of Systems  Constitutional: Negative for chills and fever.  HENT: Positive for congestion and sinus pressure. Negative for sore throat.   Eyes: Positive for photophobia. Negative for pain, redness and visual disturbance.  Respiratory: Negative for cough and shortness of breath.   Cardiovascular: Negative for chest pain and palpitations.    Gastrointestinal: Negative for abdominal pain, nausea and vomiting.  Musculoskeletal: Negative for neck pain and neck stiffness.  Skin: Negative for rash and wound.  Neurological: Positive for headaches. Negative for dizziness, syncope, weakness, light-headedness and numbness.  All other systems reviewed and are negative.    Physical Exam Updated Vital Signs BP (!) 111/52   Pulse (!) 101   Temp 98.6 F (37 C) (Oral)   Resp 16   Ht 6' (1.829 m)   Wt 132 lb 9.6 oz (60.1 kg)   SpO2 94%   BMI 17.98 kg/m   Physical Exam  Constitutional: He is oriented to person, place, and time. He appears well-developed and well-nourished. No distress.  Patient is in no distress.  HENT:  Head: Normocephalic and atraumatic.  Mouth/Throat: Oropharynx is clear and moist. No oropharyngeal exudate.  Mild maxillary sinus tenderness to percussion bilaterally.  Eyes: EOM are normal. Pupils are equal, round, and reactive to light. Scleral icterus is present.  Scleral icterus  Neck: Normal range of motion. Neck supple.  No meningismus  Cardiovascular: Normal rate and regular rhythm.   Pulmonary/Chest: Effort normal and breath sounds normal.  Abdominal: Soft. Bowel sounds are normal. There is no tenderness. There is no rebound and no guarding.  Musculoskeletal: Normal range of motion. He exhibits no edema or tenderness.  Neurological: He is alert and oriented to person, place, and time.  Patient is alert and oriented x3 with clear, goal oriented speech. Patient has 5/5 motor in all extremities. Sensation is intact to light touch. Bilateral finger-to-nose is normal with no signs of dysmetria. Patient has a normal gait and walks without assistance.  Skin: Skin is warm and dry. No rash noted. He is not diaphoretic. No erythema.  Psychiatric: He has a normal mood and affect. His behavior is normal.  Nursing note and vitals reviewed.    ED Treatments / Results  Labs (all labs ordered are listed, but only  abnormal results are displayed) Labs Reviewed  COMPREHENSIVE METABOLIC PANEL - Abnormal; Notable for the following:       Result Value   CO2 19 (*)    Glucose, Bld 112 (*)    BUN 22 (*)    Creatinine, Ser 1.62 (*)    AST 86 (*)    Alkaline Phosphatase 135 (*)    Total Bilirubin 3.6 (*)    GFR calc non Af Amer 52 (*)    All other components within normal limits  CBC WITH DIFFERENTIAL/PLATELET - Abnormal; Notable for the following:    RBC 1.55 (*)    Hemoglobin 5.3 (*)    HCT 14.7 (*)    MCH 34.2 (*)    MCHC 36.1 (*)    RDW 25.1 (*)    nRBC 6 (*)    Lymphs Abs 4.1 (*)    All other components within normal limits  COMPREHENSIVE METABOLIC PANEL  CBC  TYPE AND SCREEN  PREPARE RBC (CROSSMATCH)  EKG  EKG Interpretation None       Radiology No results found.  Procedures Procedures (including critical care time)  Medications Ordered in ED Medications  0.9 %  sodium chloride infusion (not administered)  famotidine (PEPCID) tablet 20 mg (not administered)  acetaminophen (TYLENOL) tablet 1,000 mg (not administered)  ondansetron (ZOFRAN) tablet 4 mg (not administered)  hydroxyurea (HYDREA) capsule 1,000 mg (not administered)  folic acid (FOLVITE) tablet 1 mg (not administered)  HYDROmorphone (DILAUDID) injection 1 mg (not administered)  diphenhydrAMINE (BENADRYL) injection 25 mg (not administered)  0.9 %  sodium chloride infusion (not administered)  feeding supplement (ENSURE ENLIVE) (ENSURE ENLIVE) liquid 237 mL (not administered)  oxyCODONE-acetaminophen (PERCOCET/ROXICET) 5-325 MG per tablet 1-2 tablet (not administered)    And  oxyCODONE (Oxy IR/ROXICODONE) immediate release tablet 5-10 mg (not administered)  oxyCODONE-acetaminophen (PERCOCET/ROXICET) 5-325 MG per tablet 2 tablet (2 tablets Oral Given 05/03/16 1559)  ketorolac (TORADOL) injection 60 mg (60 mg Intramuscular Given 05/03/16 1559)  sodium chloride 0.9 % bolus 1,000 mL (0 mLs Intravenous Stopped 05/03/16  1945)     Initial Impression / Assessment and Plan / ED Course  I have reviewed the triage vital signs and the nursing notes.  Pertinent labs & imaging results that were available during my care of the patient were reviewed by me and considered in my medical decision making (see chart for details).  Clinical Course    Patient is very well-appearing. He has a normal neurologic exam. Review of the patient's MRI/MRA of the brain the end of July of this year which showed a 2 mm arterial dilation in the posterior brain consistent with possible aneurysm which is unchanged from his previous studies. We'll treat patient symptomatically and check basic labs including hemoglobin. Low suspicion for aneurysm bleed and do not believe that imaging is necessary at this point.  Patient with hemoglobin of 5.3. States his baseline is around 7. This may be contributing to his headache. It for transfusion of 2 units of packed red blood cells. Final Clinical Impressions(s) / ED Diagnoses   Final diagnoses:  Headache disorder  Anemia, unspecified anemia type    New Prescriptions Current Discharge Medication List       Loren Racer, MD 05/03/16 2206

## 2016-05-03 NOTE — ED Notes (Signed)
Pt signed consent for blood transfusion, original copy at bedside.

## 2016-05-03 NOTE — ED Triage Notes (Signed)
Pt. Stated, I've had a headache for 14 hours and Ibuprofen is not helping

## 2016-05-03 NOTE — ED Notes (Signed)
Patient given a warm blanket. 

## 2016-05-04 ENCOUNTER — Observation Stay (HOSPITAL_COMMUNITY): Payer: Medicare Other

## 2016-05-04 DIAGNOSIS — D5700 Hb-SS disease with crisis, unspecified: Secondary | ICD-10-CM

## 2016-05-04 DIAGNOSIS — D649 Anemia, unspecified: Secondary | ICD-10-CM | POA: Diagnosis not present

## 2016-05-04 DIAGNOSIS — R51 Headache: Secondary | ICD-10-CM

## 2016-05-04 DIAGNOSIS — N183 Chronic kidney disease, stage 3 (moderate): Secondary | ICD-10-CM

## 2016-05-04 LAB — URINE MICROSCOPIC-ADD ON: Bacteria, UA: NONE SEEN

## 2016-05-04 LAB — COMPREHENSIVE METABOLIC PANEL
ALBUMIN: 3.3 g/dL — AB (ref 3.5–5.0)
ALK PHOS: 130 U/L — AB (ref 38–126)
ALT: 46 U/L (ref 17–63)
AST: 81 U/L — AB (ref 15–41)
Anion gap: 7 (ref 5–15)
BILIRUBIN TOTAL: 3.7 mg/dL — AB (ref 0.3–1.2)
BUN: 22 mg/dL — AB (ref 6–20)
CALCIUM: 8.8 mg/dL — AB (ref 8.9–10.3)
CO2: 18 mmol/L — ABNORMAL LOW (ref 22–32)
CREATININE: 1.74 mg/dL — AB (ref 0.61–1.24)
Chloride: 112 mmol/L — ABNORMAL HIGH (ref 101–111)
GFR calc Af Amer: 55 mL/min — ABNORMAL LOW (ref >60)
GFR, EST NON AFRICAN AMERICAN: 48 mL/min — AB (ref >60)
GLUCOSE: 98 mg/dL (ref 65–99)
Potassium: 4.8 mmol/L (ref 3.5–5.1)
Sodium: 137 mmol/L (ref 135–145)
TOTAL PROTEIN: 6.3 g/dL — AB (ref 6.5–8.1)

## 2016-05-04 LAB — URINALYSIS, ROUTINE W REFLEX MICROSCOPIC
BILIRUBIN URINE: NEGATIVE
Glucose, UA: NEGATIVE mg/dL
KETONES UR: NEGATIVE mg/dL
Leukocytes, UA: NEGATIVE
NITRITE: NEGATIVE
PH: 6.5 (ref 5.0–8.0)
PROTEIN: NEGATIVE mg/dL
Specific Gravity, Urine: 1.012 (ref 1.005–1.030)

## 2016-05-04 LAB — CBC
HEMATOCRIT: 18.7 % — AB (ref 39.0–52.0)
Hemoglobin: 6.7 g/dL — CL (ref 13.0–17.0)
MCH: 32.2 pg (ref 26.0–34.0)
MCHC: 35.8 g/dL (ref 30.0–36.0)
MCV: 89.9 fL (ref 78.0–100.0)
PLATELETS: 338 10*3/uL (ref 150–400)
RBC: 2.08 MIL/uL — ABNORMAL LOW (ref 4.22–5.81)
RDW: 20.7 % — AB (ref 11.5–15.5)
WBC: 7.5 10*3/uL (ref 4.0–10.5)

## 2016-05-04 MED ORDER — FLUTICASONE PROPIONATE 50 MCG/ACT NA SUSP: 2.0000 | Freq: Every day | NASAL | 0 refills | Status: DC

## 2016-05-04 MED ORDER — ADULT MULTIVITAMIN W/MINERALS CH
1.0000 | ORAL_TABLET | Freq: Every day | ORAL | Status: DC
  Administered 2016-05-04: 1 via ORAL
  Filled 2016-05-04: qty 1

## 2016-05-04 MED ORDER — ENSURE ENLIVE PO LIQD: 237.0000 mL | Freq: Three times a day (TID) | ORAL | Status: DC

## 2016-05-04 MED ORDER — OXYCODONE-ACETAMINOPHEN 10-325 MG PO TABS: 1.0000 | ORAL_TABLET | Freq: Four times a day (QID) | ORAL | 0 refills | Status: DC | PRN

## 2016-05-04 NOTE — Care Management Obs Status (Signed)
MEDICARE OBSERVATION STATUS NOTIFICATION   Patient Details  Name: Shawn Adams MRN: [id number] Date of Birth: 1976/06/27   Medicare Observation Status Notification Given:  Yes    Lawerance Sabalebbie Ike Maragh, RN 05/04/2016, 11:21 AM

## 2016-05-04 NOTE — Discharge Summary (Signed)
Discharge Summary  Shawn Adams ZOX:[id number] DOB: September 04, 1976  PCP: Jeanann Lewandowsky, MD  Admit date: 05/03/2016 Discharge date: 05/04/2016  Time spent: <67mins  Recommendations for Outpatient Follow-up:  1. F/u with PMD within a week  for hospital discharge follow up, repeat cbc/bmp at follow up 2. F/u with hematology Dr Jolyne Loa at [facility] 3. F/u with nephrology Dr Hyman Hopes as already scheduled  Discharge Diagnoses:  Active Hospital Problems   Diagnosis Date Noted  . Anemia 05/03/2016  . Headache 05/03/2016  . Headache disorder   . CKD (chronic kidney disease) stage 3, GFR 30-59 ml/min 06/20/2014    Resolved Hospital Problems   Diagnosis Date Noted Date Resolved  No resolved problems to display.    Discharge Condition: stable  Diet recommendation: regular diet  Filed Weights   05/03/16 2000 05/03/16 2042 05/04/16 0526  Weight: 56 kg (123 lb 7.3 oz) 60.1 kg (132 lb 9.6 oz) 60.2 kg (132 lb 11.5 oz)    History of present illness:  Shawn Adams  is a 40 y.o. male, w sickle cell , brain aneurysm has c/o headache, frontal since last nite.  Pt was noted to be severely anemic, and will be admitted for headache and transfusion.   Hospital Course:  Principal Problem:   Anemia Active Problems:   CKD (chronic kidney disease) stage 3, GFR 30-59 ml/min   Headache   Headache disorder   Headache, in light of history of aneurysm CT brain: Ventricular asymmetry likely a normal variant. Otherwise negative CT of the brain, Mucosal edema in the ethmoid sinuses. Patient does not have leukocytosis, no fever, will provide flonase  Sickle cell Anemia Symptomatic,  Transfuse with 2 units prbc, hgb improved appropriately  Symptom resolved, now feeling back to baseline  ckd stage 3, Cr close to baseline, no urinary symptom, avoid dehydration, continue follow with nephrology  Sarcoidosis stable    Procedures:  prbc transfusionx2  units  Consultations:  none  Discharge Exam: BP 113/60 (BP Location: Left Arm)   Pulse 95   Temp 99.6 F (37.6 C)   Resp 16   Ht 6' (1.829 m)   Wt 60.2 kg (132 lb 11.5 oz)   SpO2 93%   BMI 18.00 kg/m   General: NAD Cardiovascular: RRR Respiratory: CTABL  Discharge Instructions You were cared for by a hospitalist during your hospital stay. If you have any questions about your discharge medications or the care you received while you were in the hospital after you are discharged, you can call the unit and asked to speak with the hospitalist on call if the hospitalist that took care of you is not available. Once you are discharged, your primary care physician will handle any further medical issues. Please note that NO REFILLS for any discharge medications will be authorized once you are discharged, as it is imperative that you return to your primary care physician (or establish a relationship with a primary care physician if you do not have one) for your aftercare needs so that they can reassess your need for medications and monitor your lab values.  Discharge Instructions    Diet general    Complete by:  As directed   Increase activity slowly    Complete by:  As directed       Medication List    TAKE these medications   acetaminophen 500 MG tablet Commonly known as:  TYLENOL Take 1,000 mg by mouth every 8 (eight) hours as needed for mild pain, moderate pain, fever or headache.  famotidine 20 MG tablet Commonly known as:  PEPCID Take 20 mg by mouth daily.   fluticasone 50 MCG/ACT nasal spray Commonly known as:  FLONASE Place 2 sprays into both nostrils daily.   folic acid 1 MG tablet Commonly known as:  FOLVITE Take 1 tablet (1 mg total) by mouth daily.   hydroxyurea 500 MG capsule Commonly known as:  HYDREA Take 2 capsules (1,000 mg total) by mouth daily. May take with food to minimize GI side effects.   nortriptyline 10 MG capsule Commonly known as:   PAMELOR Take 1 capsule (10 mg total) by mouth at bedtime.   ondansetron 4 MG tablet Commonly known as:  ZOFRAN Take 1 tablet (4 mg total) by mouth every 6 (six) hours.   oxyCODONE-acetaminophen 10-325 MG tablet Commonly known as:  PERCOCET Take 1-2 tablets by mouth every 6 (six) hours as needed for pain.   Vitamin D 2000 units Caps Take 1 capsule (2,000 Units total) by mouth daily.      No Known Allergies Follow-up Information    [name], OLUGBEMIGA, MD Follow up in 1 week(s).   Specialty:  Internal Medicine Why:  hospital discharge follow up Contact information: [address] [place] [place] [address] [phone]        Daneen SchickAndrew Matthew Farland, MD Follow up on 05/11/2016.   Specialty:  Hematology and Oncology Why:  sickle cell Contact information: [facility] [address] [place] [address] [phone]            The results of significant diagnostics from this hospitalization (including imaging, microbiology, ancillary and laboratory) are listed below for reference.    Significant Diagnostic Studies: Ct Head Wo Contrast  Result Date: 05/04/2016 CLINICAL DATA:  Headache 2 days EXAM: CT HEAD WITHOUT CONTRAST TECHNIQUE: Contiguous axial images were obtained from the base of the skull through the vertex without intravenous contrast. COMPARISON:  None. FINDINGS: Ventricular asymmetry right greater than left. This may be a congenital variant. No hydrocephalus. Normal cerebral volume Negative for acute or chronic infarction. Negative for hemorrhage or fluid collection. Negative for mass or edema.  No shift of the midline structures. Normal calvarium.  Mucosal edema in the ethmoid sinuses. IMPRESSION: Ventricular asymmetry likely a normal variant. Otherwise negative CT of the brain Mucosal edema in the ethmoid sinuses. Electronically Signed   By: Marlan Palauharles  Clark M.D.   On: 05/04/2016 10:31   [place] Renal  Result Date: 04/28/2016 CLINICAL DATA:  Chronic renal insufficiency stage  III; asymptomatic EXAM: RENAL / URINARY TRACT ULTRASOUND COMPLETE COMPARISON:  No previous studies in PACs FINDINGS: Right Kidney: Length: 10.6 cm. The renal cortical echotexture is increased and is greater than that of the adjacent liver. There is no hydronephrosis or focal mass. Left Kidney: Length: 10.7 cm. The renal cortical echotexture is increased diffusely similar to that on the right. There is no hydronephrosis nor focal mass. Bladder: The partially distended urinary bladder is normal. IMPRESSION: Increased renal cortical echotexture bilaterally consistent with medical renal disease. There is no hydronephrosis nor parenchymal mass. The urinary bladder is unremarkable. Electronically Signed   By: David  Swazil[place]Jordan M.D.   On: 04/28/2016 15:02    Microbiology: No results found for this or any previous visit (from the past 240 hour(s)).   Labs: Basic Metabolic Panel:  Recent Labs Lab 05/03/16 1605 05/04/16 0508  NA 135 137  K 4.4 4.8  CL 110 112*  CO2 19* 18*  GLUCOSE 112* 98  BUN 22* 22*  CREATININE 1.62* 1.74*  CALCIUM 8.9 8.8*   Liver Function Tests:  Recent Labs Lab 05/03/16 1605 05/04/16 0508  AST 86* 81*  ALT 49 46  ALKPHOS 135* 130*  BILITOT 3.6* 3.7*  PROT 7.1 6.3*  ALBUMIN 3.8 3.3*   No results for input(s): LIPASE, AMYLASE in the last 168 hours. No results for input(s): AMMONIA in the last 168 hours. CBC:  Recent Labs Lab 05/03/16 1605 05/04/16 0508  WBC 7.1 7.5  NEUTROABS 2.3  --   HGB 5.3* 6.7*  HCT 14.7* 18.7*  MCV 94.8 89.9  PLT 355 338   Cardiac Enzymes: No results for input(s): CKTOTAL, CKMB, CKMBINDEX, TROPONINI in the last 168 hours. BNP: BNP (last 3 results) No results for input(s): BNP in the last 8760 hours.  ProBNP (last 3 results) No results for input(s): PROBNP in the last 8760 hours.  CBG: No results for input(s): GLUCAP in the last 168 hours.     SignedAlbertine Grates MD, PhD  [facility] 05/04/2016, 3:51 PM

## 2016-05-04 NOTE — Progress Notes (Signed)
Initial Nutrition Assessment  DOCUMENTATION CODES:   Non-severe (moderate) malnutrition in context of chronic illness, Underweight  INTERVENTION:  Provide Ensure Enlive po BID, each supplement provides 350 kcal and 20 grams of protein Provide Multivitamin with minerals daily Encourage PO intake Provided and discussed "Suggestions for Increasing Calories and Protein" handout from the Academy of Nutrition and Dietetics   NUTRITION DIAGNOSIS:   Malnutrition related to chronic illness as evidenced by severe depletion of muscle mass, mild depletion of body fat.   GOAL:   Patient will meet greater than or equal to 90% of their needs   MONITOR:   PO intake, Supplement acceptance, Labs, Skin, I & O's  REASON FOR ASSESSMENT:   Malnutrition Screening Tool    ASSESSMENT:   40 y.o. male, w sickle cell , brain aneurysm has c/o headache, frontal since last nite.  Pt was noted to be severely anemic, and will be admitted for headache and transfusion.   Pt states that his appetite has been good and he has been eating normally. He usually weighs 135 lbs and reports losing down to 120 lbs recently due to unknown reasons. He denies any nausea, abdominal pain, or constipation. He feels he eats enough. He has severe muscle wasting of patellars and thighs and moderate muscle wasting of clavicles and acromion region with mild fat wasting of arms and ribs. Recommend nutritional supplements as well as increasing calorie and protein intake in foods. Recommended daily multivitamin with minerals and daily super-B complex vitamin.   Labs: low hemoglobin  Diet Order:  Diet regular Room service appropriate? Yes; Fluid consistency: Thin  Skin:  Reviewed, no issues  Last BM:  8/8  Height:   Ht Readings from Last 1 Encounters:  05/03/16 6' (1.829 m)    Weight:   Wt Readings from Last 1 Encounters:  05/04/16 132 lb 11.5 oz (60.2 kg)    Ideal Body Weight:  80.9 kg  BMI:  Body mass index is 18  kg/m.  Estimated Nutritional Needs:   Kcal:  2200-2400  Protein:  70-80 grams  Fluid:  2.2 L/day  EDUCATION NEEDS:   Education needs addressed  Dorothea Ogleeanne Dino Borntreger RD, LDN Inpatient Clinical Dietitian Pager: [phone] After Hours Pager: [phone]

## 2016-05-04 NOTE — Care Management Note (Signed)
Case Management Note  Patient Details  Name: Shawn DollarLuke Adams MRN: [id number] Date of Birth: 03-02-1976  Subjective/Objective:                 Pt in obs with anemia, history of Sickle Cell. Patient from home, independent.   Action/Plan:  No CM needs identified at this time Expected Discharge Date:                  Expected Discharge Plan:  Home/Self Care  In-House Referral:  NA  Discharge planning Services  CM Consult  Post Acute Care Choice:  NA Choice offered to:  NA  DME Arranged:  N/A DME Agency:  NA  HH Arranged:  NA HH Agency:  NA  Status of Service:  Completed, signed off  If discussed at Long Length of Stay Meetings, dates discussed:    Additional Comments:  Lawerance SabalDebbie Hughey Rittenberry, RN 05/04/2016, 11:22 AM

## 2016-05-04 NOTE — Progress Notes (Signed)
NURSING PROGRESS NOTE  Shawn DollarLuke Adams [id number] Discharge Data: 05/04/2016 7:02 PM Attending Provider: Albertine GratesFang Xu, MD WUJ:[id number]:[name], Keane ScrapeLUGBEMIGA, MD     Shawn DollarLuke Adams to be D/C'd Home per MD order.  Discussed with the patient the After Visit Summary and all questions fully answered. All IV's discontinued with no bleeding noted. All belongings returned to patient for patient to take home.   Last Vital Signs:  Blood pressure 113/60, pulse 95, temperature 99.6 F (37.6 C), resp. rate 16, height 6' (1.829 m), weight 60.2 kg (132 lb 11.5 oz), SpO2 93 %.  Discharge Medication List   Medication List    TAKE these medications   acetaminophen 500 MG tablet Commonly known as:  TYLENOL Take 1,000 mg by mouth every 8 (eight) hours as needed for mild pain, moderate pain, fever or headache.   famotidine 20 MG tablet Commonly known as:  PEPCID Take 20 mg by mouth daily.   fluticasone 50 MCG/ACT nasal spray Commonly known as:  FLONASE Place 2 sprays into both nostrils daily.   folic acid 1 MG tablet Commonly known as:  FOLVITE Take 1 tablet (1 mg total) by mouth daily.   hydroxyurea 500 MG capsule Commonly known as:  HYDREA Take 2 capsules (1,000 mg total) by mouth daily. May take with food to minimize GI side effects.   nortriptyline 10 MG capsule Commonly known as:  PAMELOR Take 1 capsule (10 mg total) by mouth at bedtime.   ondansetron 4 MG tablet Commonly known as:  ZOFRAN Take 1 tablet (4 mg total) by mouth every 6 (six) hours.   oxyCODONE-acetaminophen 10-325 MG tablet Commonly known as:  PERCOCET Take 1-2 tablets by mouth every 6 (six) hours as needed for pain.   Vitamin D 2000 units Caps Take 1 capsule (2,000 Units total) by mouth daily.

## 2016-05-05 ENCOUNTER — Ambulatory Visit: Payer: Medicare Other | Admitting: Neurology

## 2016-05-05 LAB — TYPE AND SCREEN
ABO/RH(D): B POS
Antibody Screen: NEGATIVE
DONOR AG TYPE: NEGATIVE
DONOR AG TYPE: NEGATIVE
UNIT DIVISION: 0
UNIT DIVISION: 0

## 2016-05-11 ENCOUNTER — Ambulatory Visit (INDEPENDENT_AMBULATORY_CARE_PROVIDER_SITE_OTHER): Payer: Medicare Other | Admitting: Family Medicine

## 2016-05-11 ENCOUNTER — Encounter: Payer: Self-pay | Admitting: Family Medicine

## 2016-05-11 VITALS — BP 130/67 | HR 70 | Temp 97.8°F | Resp 16 | Ht 72.0 in | Wt 133.0 lb

## 2016-05-11 DIAGNOSIS — D571 Sickle-cell disease without crisis: Secondary | ICD-10-CM

## 2016-05-11 DIAGNOSIS — D649 Anemia, unspecified: Secondary | ICD-10-CM | POA: Diagnosis not present

## 2016-05-11 LAB — CBC WITH DIFFERENTIAL/PLATELET
BASOS ABS: 56 {cells}/uL (ref 0–200)
Basophils Relative: 1 %
EOS ABS: 168 {cells}/uL (ref 15–500)
Eosinophils Relative: 3 %
HEMATOCRIT: 20.9 % — AB (ref 38.5–50.0)
HEMOGLOBIN: 7 g/dL — AB (ref 13.2–17.1)
LYMPHS PCT: 43 %
Lymphs Abs: 2408 cells/uL (ref 850–3900)
MCH: 31 pg (ref 27.0–33.0)
MCHC: 33.5 g/dL (ref 32.0–36.0)
MCV: 92.5 fL (ref 80.0–100.0)
MONOS PCT: 6 %
MPV: 9.8 fL (ref 7.5–12.5)
Monocytes Absolute: 336 cells/uL (ref 200–950)
Neutro Abs: 2632 cells/uL (ref 1500–7800)
Neutrophils Relative %: 47 %
Platelets: 538 10*3/uL — ABNORMAL HIGH (ref 140–400)
RBC: 2.26 MIL/uL — ABNORMAL LOW (ref 4.20–5.80)
RDW: 20 % — ABNORMAL HIGH (ref 11.0–15.0)
WBC: 5.6 10*3/uL (ref 3.8–10.8)

## 2016-05-11 LAB — COMPLETE METABOLIC PANEL WITH GFR
ALBUMIN: 4.3 g/dL (ref 3.6–5.1)
ALK PHOS: 154 U/L — AB (ref 40–115)
ALT: 60 U/L — ABNORMAL HIGH (ref 9–46)
AST: 81 U/L — AB (ref 10–40)
BUN: 24 mg/dL (ref 7–25)
CALCIUM: 9.8 mg/dL (ref 8.6–10.3)
CO2: 20 mmol/L (ref 20–31)
CREATININE: 1.39 mg/dL — AB (ref 0.60–1.35)
Chloride: 107 mmol/L (ref 98–110)
GFR, Est African American: 73 mL/min (ref >=60)
GFR, Est Non African American: 63 mL/min (ref >=60)
Glucose, Bld: 108 mg/dL — ABNORMAL HIGH (ref 65–99)
POTASSIUM: 4.3 mmol/L (ref 3.5–5.3)
Sodium: 137 mmol/L (ref 135–146)
Total Bilirubin: 2.1 mg/dL — ABNORMAL HIGH (ref 0.2–1.2)
Total Protein: 7.3 g/dL (ref 6.1–8.1)

## 2016-05-11 NOTE — Patient Instructions (Signed)
Call and let us know when your next appt with hematologist is and we will coordinate that with your next visit here.

## 2016-05-11 NOTE — Progress Notes (Signed)
Shawn Adams, is a 40 y.o. male  NFA:[id number]:[id number]  ION:[id number]  DOB - [birth year]  CC:  Chief Complaint  Patient presents with  . Follow-up    scd  . Medication Refill    oxycodone        HPI: Shawn DollarLuke Alipio is a 40 y.o. male here for follow-up SCD. He has a hematologist in [place] who manages his SCD. [place] pain crisis earliet this month. He follows-up here. Last time he was here Dr. Hyman HopesJegede refilled his non-narcotic chronic SCD medications.  He reports doing well since his recent discharge. He is in need of refill of narcotic. I have explained he will need to call hematologist. He was transfused at this last admission. He had his last short-acting narcotic late last evening.  He denies fever, chills, visual disturbance or bleeding in the eyes. He also denies skin issues, chest pain, shortness of breath, abdominal pain, N/V/D. He denies hip pain and unilateral weakness. He does admit to occassional headaches.   He is due for eye exam and urine micral. He will check in with his hematologist about these.   No Known Allergies Past Medical History:  Diagnosis Date  . Aneurysm (HCC)    Pt states happened in 2011, Brain   . Heart murmur   . Sarcoid (HCC)   . Sickle cell anemia (HCC)    Current Outpatient Prescriptions on File Prior to Visit  Medication Sig Dispense Refill  . acetaminophen (TYLENOL) 500 MG tablet Take 1,000 mg by mouth every 8 (eight) hours as needed for mild pain, moderate pain, fever or headache.    . Cholecalciferol (VITAMIN D) 2000 units CAPS Take 1 capsule (2,000 Units total) by mouth daily. (Patient not taking: Reported on 05/03/2016) 90 capsule 3  . famotidine (PEPCID) 20 MG tablet Take 20 mg by mouth daily.     . fluticasone (FLONASE) 50 MCG/ACT nasal spray Place 2 sprays into both nostrils daily. 16 g 0  . folic acid (FOLVITE) 1 MG tablet Take 1 tablet (1 mg total) by mouth daily. 90 tablet 3  . hydroxyurea (HYDREA) 500 MG capsule  Take 2 capsules (1,000 mg total) by mouth daily. May take with food to minimize GI side effects. 180 capsule 3  . nortriptyline (PAMELOR) 10 MG capsule Take 1 capsule (10 mg total) by mouth at bedtime. (Patient not taking: Reported on 05/03/2016) 30 capsule 0  . ondansetron (ZOFRAN) 4 MG tablet Take 1 tablet (4 mg total) by mouth every 6 (six) hours. 12 tablet 0  . oxyCODONE-acetaminophen (PERCOCET) 10-325 MG tablet Take 1-2 tablets by mouth every 6 (six) hours as needed for pain. 5 tablet 0   No current facility-administered medications on file prior to visit.    Family History  Problem Relation Age of Onset  . Diabetes Mother   . Hypertension Father   . Lupus Sister    Social History   Social History  . Marital status: Single    Spouse name: N/A  . Number of children: N/A  . Years of education: N/A   Occupational History  . Not on file.   Social History Main Topics  . Smoking status: Former Games developermoker  . Smokeless tobacco: Never Used  . Alcohol use No     Comment: occasionally  . Drug use: No     Comment: Pt states uses 2 times a day  . Sexual activity: Yes    Birth control/ protection: None  Other Topics Concern  . Not on file   Social History Narrative  . No narrative on file    Review of Systems: Constitutional: Negative for fever, chills, appetite change, weight loss,  Fatigue. Skin: Negative for rashes or lesions of concern. HENT: Negative for ear pain, ear discharge.nose bleeds Eyes: Negative for pain, discharge, redness, itching and visual disturbance. Neck: Negative for pain, stiffness Respiratory: Negative for cough, shortness of breath,   Cardiovascular: Negative for chest pain, palpitations and leg swelling. Gastrointestinal: Negative for abdominal pain, nausea, vomiting, diarrhea, constipations Genitourinary: Negative for dysuria, urgency, frequency, hematuria,  Musculoskeletal: Negative for back pain, joint pain, joint  swelling, and gait problem.Negative  for weakness. Neurological: Negative for dizziness, tremors, seizures, syncope,   light-headedness, numbness. Posiitve for headaches. Hematological: Negative for easy bruising or bleeding Psychiatric/Behavioral: Negative for depression, anxiety, decreased concentration, confusion   Objective:   Vitals:   05/11/16 1034  BP: 130/67  Pulse: 70  Resp: 16  Temp: 97.8 F (36.6 C)    Physical Exam: Constitutional: Patient appears well-developed and well-nourished. No distress. HENT: Normocephalic, atraumatic, External right and left ear normal. Oropharynx is clear and moist.  Eyes: Conjunctivae and EOM are normal. PERRLA, no scleral icterus. Neck: Normal ROM. Neck supple. No lymphadenopathy, No thyromegaly. CVS: RRR, S1/S2 +, no murmurs, no gallops, no rubs Pulmonary: Effort and breath sounds normal, no stridor, rhonchi, wheezes, rales.  Abdominal: Soft. Normoactive BS,, no distension, tenderness, rebound or guarding.  Musculoskeletal: Normal range of motion. No edema and no tenderness.  Neuro: Alert.Normal muscle tone coordination. Non-focal Skin: Skin is warm and dry. No rash noted. Not diaphoretic. No erythema. No pallor. Psychiatric: Normal mood and affect. Behavior, judgment, thought content normal.  Lab Results  Component Value Date   WBC 7.5 05/04/2016   HGB 6.7 (LL) 05/04/2016   HCT 18.7 (L) 05/04/2016   MCV 89.9 05/04/2016   PLT 338 05/04/2016   Lab Results  Component Value Date   CREATININE 1.74 (H) 05/04/2016   BUN 22 (H) 05/04/2016   NA 137 05/04/2016   K 4.8 05/04/2016   CL 112 (H) 05/04/2016   CO2 18 (L) 05/04/2016    No results found for: HGBA1C Lipid Panel  No results found for: CHOL, TRIG, HDL, CHOLHDL, VLDL, LDLCALC     Assessment and plan:   1. Hb-SS disease without crisis (HCC)  - CBC w/Diff - COMPLETE METABOLIC PANEL WITH GFR - Flu Vaccine QUAD with presevative   No Follow-up on file.  The patient was given clear instructions to go to ER  or return to medical center if symptoms don't improve, worsen or new problems develop. The patient verbalized understanding.    Henrietta HooverLinda C Bernhardt FNP  05/11/2016, 1:29 PM

## 2016-06-17 ENCOUNTER — Emergency Department (HOSPITAL_COMMUNITY): Payer: Medicare Other

## 2016-06-17 ENCOUNTER — Encounter (HOSPITAL_COMMUNITY): Payer: Self-pay

## 2016-06-17 ENCOUNTER — Emergency Department (HOSPITAL_COMMUNITY)
Admission: EM | Admit: 2016-06-17 | Discharge: 2016-06-17 | Disposition: A | Discharge status: Home / Self Care | Payer: Medicare Other | Attending: Emergency Medicine | Admitting: Emergency Medicine

## 2016-06-17 DIAGNOSIS — Z79899 Other long term (current) drug therapy: Secondary | ICD-10-CM | POA: Insufficient documentation

## 2016-06-17 DIAGNOSIS — Z87891 Personal history of nicotine dependence: Secondary | ICD-10-CM | POA: Diagnosis not present

## 2016-06-17 DIAGNOSIS — N183 Chronic kidney disease, stage 3 (moderate): Secondary | ICD-10-CM | POA: Diagnosis not present

## 2016-06-17 DIAGNOSIS — M25512 Pain in left shoulder: Secondary | ICD-10-CM

## 2016-06-17 NOTE — ED Triage Notes (Signed)
Pt here with left shoulder pain. Pulled 2-3 weeks ago while doing pushups.  Not getting better

## 2016-06-17 NOTE — ED Provider Notes (Signed)
[facility]-EMERGENCY DEPT Provider Note   CSN: [id number] Arrival date & time: 06/17/16  1109  By signing my name below, I, [name], attest that this documentation has been prepared under the direction and in the presence of Shawn Pel, PA-C. Electronically Signed: [name], Scribe. 06/17/2016. 12:48 PM.    History   Chief Complaint Chief Complaint  Patient presents with  . Shoulder Pain      Shoulder Pain   Pertinent negatives include no numbness.   HPI Comments:  Shawn Adams is a 40 y.o. male who presents to the Emergency Department complaining of constant L shoulder pain x 3 weeks. Pt states that he was doing pushups and he lost control of his L arm, then he landed on his shoulder. Pt has taken tylenol and percocet with temporary relief. Pt denies numbness, weakness, back pain, SOB, cough, chest pain. He is not having symptoms of sickle cell crisis for which he is prescribed Percocet. He has been resting his arm and not working out on it, he has not seen anyone for this injury yet.  Past Medical History:  Diagnosis Date  . Aneurysm (HCC)    Pt states happened in 2011, Brain   . Heart murmur   . Sarcoid (HCC)   . Sickle cell anemia [facility])     Patient Active Problem List   Diagnosis Date Noted  . Headache 05/03/2016  . Anemia 05/03/2016  . Headache disorder   . Vitamin D deficiency 02/09/2016  . Heart murmur 02/09/2016  . Recurrent occipital headache 10/15/2015  . History of aneurysm 10/13/2015  . Left hip pain 07/30/2015  . Left ankle pain 07/08/2015  . Sickle cell crisis (HCC) 03/08/2015  . 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 Sickle cell anemia with crisis (HCC) 12/07/2014  . Sickle cell anemia (HCC) 06/20/2014  . CKD (chronic kidney disease) stage 3, GFR 30-59 ml/min 06/20/2014  . Leukocytosis 06/20/2014  . Sickle cell pain crisis (HCC) 06/20/2014  . Hypercalcemia 06/20/2014  . Symptomatic anemia 05/31/2014    Past Surgical  History:  Procedure Laterality Date  . LYMPH NODE BIOPSY         Home Medications    Prior to Admission medications   Medication Sig Start Date End Date Taking? Authorizing Provider  acetaminophen (TYLENOL) 500 MG tablet Take 1,000 mg by mouth every 8 (eight) hours as needed for mild pain, moderate pain, fever or headache.    Historical Provider, MD  Cholecalciferol (VITAMIN D) 2000 units CAPS Take 1 capsule (2,000 Units total) by mouth daily. Patient not taking: Reported on 05/03/2016 02/09/16   Quentin Angst, MD  famotidine (PEPCID) 20 MG tablet Take 20 mg by mouth daily.     Historical Provider, MD  fluticasone (FLONASE) 50 MCG/ACT nasal spray Place 2 sprays into both nostrils daily. 05/04/16   Albertine Grates, MD  folic acid (FOLVITE) 1 MG tablet Take 1 tablet (1 mg total) by mouth daily. 02/09/16   Quentin Angst, MD  hydroxyurea (HYDREA) 500 MG capsule Take 2 capsules (1,000 mg total) by mouth daily. May take with food to minimize GI side effects. 02/09/16   Quentin Angst, MD  nortriptyline (PAMELOR) 10 MG capsule Take 1 capsule (10 mg total) by mouth at bedtime. Patient not taking: Reported on 05/03/2016 02/09/16   Quentin Angst, MD  ondansetron (ZOFRAN) 4 MG tablet Take 1 tablet (4 mg total) by mouth every 6 (six) hours. 12/21/15   Eyvonne Mechanic, PA-C  oxyCODONE-acetaminophen (PERCOCET)  10-325 MG tablet Take 1-2 tablets by mouth every 6 (six) hours as needed for pain. 05/04/16   Albertine Grates, MD    Family History Family History  Problem Relation Age of Onset  . Diabetes Mother   . Hypertension Father   . Lupus Sister     Social History Social History  Substance Use Topics  . Smoking status: Former Games developer  . Smokeless tobacco: Never Used  . Alcohol use No     Comment: occasionally     Allergies   Review of patient's allergies indicates no known allergies.   Review of Systems Review of Systems  Respiratory: Negative for cough.   Cardiovascular: Negative for chest  pain.  Musculoskeletal: Positive for arthralgias.  Neurological: Negative for weakness and numbness.     Physical Exam Updated Vital Signs BP 127/76   Pulse 91   Temp 98.3 F (36.8 C)   SpO2 92%   Physical Exam  Constitutional: He appears well-developed and well-nourished. No distress.  HENT:  Head: Normocephalic and atraumatic.  Eyes: Conjunctivae are normal.  Cardiovascular: Normal rate.   Pulmonary/Chest: Effort normal.  Abdominal: He exhibits no distension.  Musculoskeletal:       Left shoulder: He exhibits tenderness (to musculature ) and pain (with ROM). He exhibits normal range of motion, no bony tenderness, no swelling, no effusion, no crepitus, no deformity, no laceration, no spasm, normal pulse and normal strength.  Pulses are symmetrical.   Neurological: He is alert.  Skin: Skin is warm and dry.  Psychiatric: He has a normal mood and affect.  Nursing note and vitals reviewed.    ED Treatments / Results  DIAGNOSTIC STUDIES:  Oxygen Saturation is 92% on RA, adequate by my interpretation.    COORDINATION OF CARE:  12:48 PM Discussed treatment plan with pt at bedside and pt agreed to plan.   Labs (all labs ordered are listed, but only abnormal results are displayed) Labs Reviewed - No data to display  EKG  EKG Interpretation None       Radiology Dg Shoulder Left  Result Date: 06/17/2016 CLINICAL DATA:  Left shoulder pain.  Pain after exercising. EXAM: LEFT SHOULDER - 2+ VIEW COMPARISON:  Chest x-ray 12/21/2015. FINDINGS: No acute bony or joint abnormality identified. No evidence of fracture or dislocation. No focal abnormality. IMPRESSION: No acute or focal abnormality. Electronically Signed   By: Maisie Fus  Register   On: 06/17/2016 11:51    Procedures Procedures (including critical care time)  Medications Ordered in ED Medications - No data to display   Initial Impression / Assessment and Plan / ED Course  I have reviewed the triage vital signs  and the nursing notes.  Pertinent labs & imaging results that were available during my care of the patient were reviewed by me and considered in my medical decision making (see chart for details).  Clinical Course    Patient is here with acute injury of left shoulder 3 weeks ago while doing push-ups, he has not had any symptoms of sickle cell crisis or cardiopulmonary symptoms. His exam reveals a muscular injury. I discussed s/sx that warrant return to the ED. He can continue to take pain medications at home. Patient to be placed in a shoulder sling for comfort and referred to Ortho.  Final Clinical Impressions(s) / ED Diagnoses   Final diagnoses:  Left shoulder pain    New Prescriptions New Prescriptions   No medications on file    I personally performed the services described in  this documentation, which was scribed in my presence. The recorded information has been reviewed and is accurate.    Shawn Peliffany Bearett Porcaro, PA-C 06/17/16 1257    Shaune Pollackameron Isaacs, MD 06/18/16 [phone]

## 2016-06-30 ENCOUNTER — Ambulatory Visit (INDEPENDENT_AMBULATORY_CARE_PROVIDER_SITE_OTHER): Payer: Medicare Other | Admitting: Orthopedic Surgery

## 2016-06-30 DIAGNOSIS — M25512 Pain in left shoulder: Secondary | ICD-10-CM

## 2016-07-05 ENCOUNTER — Ambulatory Visit: Payer: Medicare Other | Admitting: Neurology

## 2016-07-28 ENCOUNTER — Ambulatory Visit (INDEPENDENT_AMBULATORY_CARE_PROVIDER_SITE_OTHER): Payer: Medicare Other | Admitting: Orthopedic Surgery

## 2016-07-28 DIAGNOSIS — G8929 Other chronic pain: Secondary | ICD-10-CM | POA: Diagnosis not present

## 2016-07-28 DIAGNOSIS — M25512 Pain in left shoulder: Secondary | ICD-10-CM | POA: Diagnosis not present

## 2016-07-28 NOTE — Progress Notes (Signed)
Office Visit Note   Patient: Shawn Adams Mcclanahan           Date of Birth: Mar 26, 1976           MRN: [id number] Visit Date: 07/28/2016              Requested by: Massie MaroonLachina M Hollis, FNP [address] [place] , [place] [id number] PCP: Massie MaroonHollis,[name], FNP   Assessment & Plan: Visit Diagnoses:  1. Chronic left shoulder pain     Plan: Impression is left shoulder pain from overuse.  Currently symptoms have improved.  No indication for further workup intervention or MRI scanning at this time follow-up with me as needed I would consider imaging because this sickle cell trait if shoulder symptoms persist.  Follow-Up Instructions: Return if symptoms worsen or fail to improve.   Orders:  No orders of the defined types were placed in this encounter.  No orders of the defined types were placed in this encounter.     Procedures: No procedures performed   Clinical Data: No additional findings.   Subjective: Chief Complaint  Patient presents with  . Left Shoulder - Follow-up  Shawn Adams is a 40 year old patient with left shoulder pain.  Doing a lot of pushups several months ago and had immediate onset of pain.  Since that time he states his shoulder has improved.  Currently asymptomatic with the shoulder no issues with work or with sleep.  Not taking any medication.  He does have sickle cell trait.  Shawn Adams states that his left shoulder is doing much better.  He does not have any concerns about this today.    Review of Systems all systems reviewed negative as they relate to the chief complaint.  No fevers or chills   Objective: Vital Signs: There were no vitals taken for this visit.  Physical Exam  Constitutional: He appears well-developed.  HENT:  Head: Normocephalic.  Eyes: EOM are normal.  Neck: Normal range of motion.  Cardiovascular: Normal rate.   Pulmonary/Chest: Effort normal.  Neurological: He is alert.  Skin: Skin is warm.  Psychiatric: He has a normal mood and affect.     Ortho Exam bilateral shoulder exam demonstrates full active passive range of motion he does have a little bit of posterior crepitus with internal/external rotation above 90 abduction on both shoulders rotator cuff strength intact to infraspinatus 6 space upset muscle testing no other masses lymph adenopathy or skin changes warmth noted in the shoulder region no before meals joint tenderness on either shoulder no restriction of external rotation on either shoulder  Specialty Comments:  No specialty comments available.  Imaging: No results found.   PMFS History: Patient Active Problem List   Diagnosis Date Noted  . Headache 05/03/2016  . Anemia 05/03/2016  . Headache disorder   . Vitamin D deficiency 02/09/2016  . Heart murmur 02/09/2016  . Recurrent occipital headache 10/15/2015  . History of aneurysm 10/13/2015  . Left hip pain 07/30/2015  . Left ankle pain 07/08/2015  . Sickle cell crisis (HCC) 03/08/2015  . 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 Sickle cell anemia with crisis (HCC) 12/07/2014  . Sickle cell anemia (HCC) 06/20/2014  . CKD (chronic kidney disease) stage 3, GFR 30-59 ml/min 06/20/2014  . Leukocytosis 06/20/2014  . Sickle cell pain crisis (HCC) 06/20/2014  . Hypercalcemia 06/20/2014  . Symptomatic anemia 05/31/2014   Past Medical History:  Diagnosis Date  . Aneurysm (HCC)    Pt states happened in 2011,  Brain   . Heart murmur   . Sarcoid (HCC)   . Sickle cell anemia (HCC)     Family History  Problem Relation Age of Onset  . Diabetes Mother   . Hypertension Father   . Lupus Sister     Past Surgical History:  Procedure Laterality Date  . LYMPH NODE BIOPSY     Social History   Occupational History  . Not on file.   Social History Main Topics  . Smoking status: Former Games developermoker  . Smokeless tobacco: Never Used  . Alcohol use No     Comment: occasionally  . Drug use: No     Comment: Pt states uses 2 times a day  . Sexual activity:  Yes    Birth control/ protection: None

## 2016-07-29 ENCOUNTER — Encounter (INDEPENDENT_AMBULATORY_CARE_PROVIDER_SITE_OTHER): Payer: Self-pay | Admitting: Orthopedic Surgery

## 2016-08-09 ENCOUNTER — Ambulatory Visit: Payer: Medicare Other | Admitting: Internal Medicine

## 2016-08-23 ENCOUNTER — Emergency Department (HOSPITAL_COMMUNITY): Payer: Medicare Other

## 2016-08-23 ENCOUNTER — Encounter (HOSPITAL_COMMUNITY): Payer: Self-pay | Admitting: *Deleted

## 2016-08-23 ENCOUNTER — Emergency Department (HOSPITAL_COMMUNITY)
Admission: EM | Admit: 2016-08-23 | Discharge: 2016-08-23 | Disposition: A | Discharge status: Home / Self Care | Payer: Medicare Other | Attending: Emergency Medicine | Admitting: Emergency Medicine

## 2016-08-23 DIAGNOSIS — N183 Chronic kidney disease, stage 3 (moderate): Secondary | ICD-10-CM | POA: Diagnosis not present

## 2016-08-23 DIAGNOSIS — S20212A Contusion of left front wall of thorax, initial encounter: Secondary | ICD-10-CM | POA: Insufficient documentation

## 2016-08-23 DIAGNOSIS — W500XXA Accidental hit or strike by another person, initial encounter: Secondary | ICD-10-CM | POA: Diagnosis not present

## 2016-08-23 DIAGNOSIS — Y939 Activity, unspecified: Secondary | ICD-10-CM | POA: Insufficient documentation

## 2016-08-23 DIAGNOSIS — Y999 Unspecified external cause status: Secondary | ICD-10-CM | POA: Insufficient documentation

## 2016-08-23 DIAGNOSIS — D649 Anemia, unspecified: Secondary | ICD-10-CM | POA: Insufficient documentation

## 2016-08-23 DIAGNOSIS — S299XXA Unspecified injury of thorax, initial encounter: Secondary | ICD-10-CM | POA: Diagnosis present

## 2016-08-23 DIAGNOSIS — Z87891 Personal history of nicotine dependence: Secondary | ICD-10-CM | POA: Insufficient documentation

## 2016-08-23 DIAGNOSIS — Y929 Unspecified place or not applicable: Secondary | ICD-10-CM | POA: Insufficient documentation

## 2016-08-23 LAB — CBC WITH DIFFERENTIAL/PLATELET
BASOS ABS: 0 10*3/uL (ref 0.0–0.1)
Basophils Relative: 0 %
EOS ABS: 0.1 10*3/uL (ref 0.0–0.7)
Eosinophils Relative: 1 %
HCT: 15.3 % — ABNORMAL LOW (ref 39.0–52.0)
Hemoglobin: 5.8 g/dL — CL (ref 13.0–17.0)
LYMPHS PCT: 20 %
Lymphs Abs: 2.4 10*3/uL (ref 0.7–4.0)
MCH: 34.5 pg — AB (ref 26.0–34.0)
MCHC: 37.9 g/dL — ABNORMAL HIGH (ref 30.0–36.0)
MCV: 91.1 fL (ref 78.0–100.0)
Monocytes Absolute: 1.1 10*3/uL — ABNORMAL HIGH (ref 0.1–1.0)
Monocytes Relative: 9 %
NEUTROS PCT: 70 %
Neutro Abs: 8.2 10*3/uL — ABNORMAL HIGH (ref 1.7–7.7)
PLATELETS: 366 10*3/uL (ref 150–400)
RBC: 1.68 MIL/uL — ABNORMAL LOW (ref 4.22–5.81)
RDW: 28 % — ABNORMAL HIGH (ref 11.5–15.5)
WBC: 11.8 10*3/uL — ABNORMAL HIGH (ref 4.0–10.5)

## 2016-08-23 LAB — COMPREHENSIVE METABOLIC PANEL
ALK PHOS: 131 U/L — AB (ref 38–126)
ALT: 28 U/L (ref 17–63)
ANION GAP: 8 (ref 5–15)
AST: 74 U/L — ABNORMAL HIGH (ref 15–41)
Albumin: 3.9 g/dL (ref 3.5–5.0)
BILIRUBIN TOTAL: 2.8 mg/dL — AB (ref 0.3–1.2)
BUN: 25 mg/dL — ABNORMAL HIGH (ref 6–20)
CALCIUM: 8.8 mg/dL — AB (ref 8.9–10.3)
CO2: 20 mmol/L — ABNORMAL LOW (ref 22–32)
Chloride: 108 mmol/L (ref 101–111)
Creatinine, Ser: 1.82 mg/dL — ABNORMAL HIGH (ref 0.61–1.24)
GFR calc non Af Amer: 45 mL/min — ABNORMAL LOW (ref >60)
GFR, EST AFRICAN AMERICAN: 52 mL/min — AB (ref >60)
Glucose, Bld: 113 mg/dL — ABNORMAL HIGH (ref 65–99)
Potassium: 4.1 mmol/L (ref 3.5–5.1)
Sodium: 136 mmol/L (ref 135–145)
TOTAL PROTEIN: 7.6 g/dL (ref 6.5–8.1)

## 2016-08-23 MED ORDER — IOPAMIDOL (ISOVUE-370) INJECTION 76%
80.0000 mL | Freq: Once | INTRAVENOUS | Status: AC | PRN
  Administered 2016-08-23: 74 mL via INTRAVENOUS

## 2016-08-23 MED ORDER — IOPAMIDOL (ISOVUE-370) INJECTION 76%
INTRAVENOUS | Status: AC
  Filled 2016-08-23: qty 100

## 2016-08-23 MED ORDER — HYDROMORPHONE HCL 1 MG/ML IJ SOLN
1.0000 mg | Freq: Once | INTRAMUSCULAR | Status: AC
  Administered 2016-08-23: 1 mg via INTRAMUSCULAR
  Filled 2016-08-23: qty 1

## 2016-08-23 MED ORDER — SODIUM CHLORIDE 0.9 % IJ SOLN
INTRAMUSCULAR | Status: AC
  Filled 2016-08-23: qty 50

## 2016-08-23 MED ORDER — OXYCODONE-ACETAMINOPHEN 5-325 MG PO TABS
1.0000 | ORAL_TABLET | ORAL | Status: DC | PRN
  Administered 2016-08-23: 1 via ORAL
  Filled 2016-08-23: qty 1
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
## 2016-08-24 ENCOUNTER — Other Ambulatory Visit (HOSPITAL_COMMUNITY): Payer: Self-pay | Admitting: *Deleted

## 2016-08-24 ENCOUNTER — Encounter (HOSPITAL_COMMUNITY): Payer: Medicare Other

## 2016-08-25 ENCOUNTER — Ambulatory Visit (HOSPITAL_COMMUNITY)
Admission: RE | Admit: 2016-08-25 | Discharge: 2016-08-25 | Disposition: A | Discharge status: Home / Self Care | Payer: Medicare Other | Source: Ambulatory Visit | Attending: Internal Medicine | Admitting: Internal Medicine

## 2016-08-25 DIAGNOSIS — D649 Anemia, unspecified: Secondary | ICD-10-CM | POA: Insufficient documentation

## 2016-08-25 DIAGNOSIS — S20212A Contusion of left front wall of thorax, initial encounter: Secondary | ICD-10-CM | POA: Insufficient documentation

## 2016-08-25 LAB — PREPARE RBC (CROSSMATCH)

## 2016-08-25 MED ORDER — DIPHENHYDRAMINE HCL 25 MG PO CAPS
25.0000 mg | ORAL_CAPSULE | Freq: Once | ORAL | Status: AC
  Administered 2016-08-25: 25 mg via ORAL
  Filled 2016-08-25: qty 1

## 2016-08-25 MED ORDER — SODIUM CHLORIDE 0.9 % IV SOLN
Freq: Once | INTRAVENOUS | Status: AC
  Administered 2016-08-25: 10:00:00 via INTRAVENOUS

## 2016-08-25 NOTE — Progress Notes (Signed)
Spoke with Dr. Hyman HopesJegede about orders for blood transfusion as patient was seen in ED on 08/23/2016 and [facility]  Patient called on yesterday and rescheduled the appointment for today.  However, no orders were placed by emergency room physician.  Orders received.

## 2016-08-25 NOTE — Discharge Instructions (Signed)

## 2016-08-25 NOTE — Progress Notes (Signed)
Patient ID: Mariam DollarLuke Adams, male   DOB: 08-Jul-1976, 40 y.o.   MRN: [id number] Provider: Quentin Angstlugbemiga E [name], MD  Associated Diagnosis:   Procedure: Transfusion of 2 units of PRBC via PIV as ordered.   Patient tolerated procedure well. No reaction.  Went over discharge instructions and copy given to patient. Alert, oriented and ambulatory at time of discharge. Discharged to home.

## 2016-08-26 LAB — TYPE AND SCREEN
ABO/RH(D): B POS
ANTIBODY SCREEN: NEGATIVE
Donor AG Type: NEGATIVE
Donor AG Type: NEGATIVE
Unit division: 0
Unit division: 0

## 2016-10-24 ENCOUNTER — Ambulatory Visit (HOSPITAL_COMMUNITY): Admission: RE | Admit: 2016-10-24 | Payer: Medicare Other | Source: Ambulatory Visit

## 2016-10-26 ENCOUNTER — Ambulatory Visit (HOSPITAL_COMMUNITY): Admission: RE | Admit: 2016-10-26 | Payer: Medicare Other | Source: Ambulatory Visit

## 2016-10-27 ENCOUNTER — Ambulatory Visit (HOSPITAL_COMMUNITY)
Admission: RE | Admit: 2016-10-27 | Discharge: 2016-10-27 | Disposition: A | Discharge status: Home / Self Care | Payer: Medicare Other | Source: Ambulatory Visit | Attending: Internal Medicine | Admitting: Internal Medicine

## 2016-10-27 DIAGNOSIS — R011 Cardiac murmur, unspecified: Secondary | ICD-10-CM | POA: Diagnosis not present

## 2016-10-27 DIAGNOSIS — I517 Cardiomegaly: Secondary | ICD-10-CM | POA: Insufficient documentation

## 2016-10-27 NOTE — Progress Notes (Signed)
  Echocardiogram 2D Echocardiogram has been performed.  Leta JunglingCooper, Joeleen Wortley M 10/27/2016, 10:29 AM

## 2016-11-04 ENCOUNTER — Emergency Department (HOSPITAL_COMMUNITY)
Admission: EM | Admit: 2016-11-04 | Discharge: 2016-11-04 | Disposition: A | Discharge status: Home / Self Care | Payer: Medicare Other | Attending: Emergency Medicine | Admitting: Emergency Medicine

## 2016-11-04 ENCOUNTER — Emergency Department (HOSPITAL_COMMUNITY): Payer: Medicare Other

## 2016-11-04 ENCOUNTER — Encounter (HOSPITAL_COMMUNITY): Payer: Self-pay | Admitting: Emergency Medicine

## 2016-11-04 DIAGNOSIS — Y999 Unspecified external cause status: Secondary | ICD-10-CM | POA: Insufficient documentation

## 2016-11-04 DIAGNOSIS — Y929 Unspecified place or not applicable: Secondary | ICD-10-CM | POA: Diagnosis not present

## 2016-11-04 DIAGNOSIS — Y939 Activity, unspecified: Secondary | ICD-10-CM | POA: Insufficient documentation

## 2016-11-04 DIAGNOSIS — N183 Chronic kidney disease, stage 3 (moderate): Secondary | ICD-10-CM | POA: Diagnosis not present

## 2016-11-04 DIAGNOSIS — S3992XA Unspecified injury of lower back, initial encounter: Secondary | ICD-10-CM | POA: Diagnosis present

## 2016-11-04 DIAGNOSIS — Z87891 Personal history of nicotine dependence: Secondary | ICD-10-CM | POA: Insufficient documentation

## 2016-11-04 DIAGNOSIS — R296 Repeated falls: Secondary | ICD-10-CM | POA: Insufficient documentation

## 2016-11-04 DIAGNOSIS — Z79899 Other long term (current) drug therapy: Secondary | ICD-10-CM | POA: Diagnosis not present

## 2016-11-04 DIAGNOSIS — X58XXXA Exposure to other specified factors, initial encounter: Secondary | ICD-10-CM | POA: Insufficient documentation

## 2016-11-04 DIAGNOSIS — S300XXA Contusion of lower back and pelvis, initial encounter: Secondary | ICD-10-CM | POA: Diagnosis not present

## 2016-11-04 MED ORDER — HYDROMORPHONE HCL 1 MG/ML IJ SOLN
1.0000 mg | Freq: Once | INTRAMUSCULAR | Status: AC
  Administered 2016-11-04: 1 mg via INTRAMUSCULAR
  Filled 2016-11-04: qty 1

## 2016-11-04 NOTE — ED Provider Notes (Signed)
[facility]-EMERGENCY DEPT Provider Note   CSN: [id number] Arrival date & time: 11/04/16  1328     History   Chief Complaint Chief Complaint  Patient presents with  . Back Pain  . Sickle Cell Pain Crisis    HPI Shawn Adams is a 41 y.o. male.  Patient is a 41 year old male with a history of sickle cell disease who presents with pain to his tailbone. He states he's had several falls although none recently where he's fallen on his tailbone. Over the last 2 weeks he's had worsening pain to his tailbone. It hurts when he sits on it. He has no difficulty with ambulation. No radiation down his legs. No numbness or weakness to his legs. He typically has back pain with his sickle cell pain crises but he says this feels different. He denies any fevers or other recent illnesses. No chest pain or shortness of breath.      Past Medical History:  Diagnosis Date  . Aneurysm (HCC)    Pt states happened in 2011, Brain   . Heart murmur   . Sarcoid (HCC)   . Sickle cell anemia [facility])     Patient Active Problem List   Diagnosis Date Noted  . Headache 05/03/2016  . Anemia 05/03/2016  . Headache disorder   . Vitamin D deficiency 02/09/2016  . Heart murmur 02/09/2016  . Recurrent occipital headache 10/15/2015  . History of aneurysm 10/13/2015  . Left hip pain 07/30/2015  . Left ankle pain 07/08/2015  . Sickle cell crisis (HCC) 03/08/2015  . 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 Sickle cell anemia with crisis (HCC) 12/07/2014  . Sickle cell anemia (HCC) 06/20/2014  . CKD (chronic kidney disease) stage 3, GFR 30-59 ml/min 06/20/2014  . Leukocytosis 06/20/2014  . Sickle cell pain crisis (HCC) 06/20/2014  . Hypercalcemia 06/20/2014  . Symptomatic anemia 05/31/2014    Past Surgical History:  Procedure Laterality Date  . LYMPH NODE BIOPSY         Home Medications    Prior to Admission medications   Medication Sig Start Date End Date Taking? Authorizing Provider    acetaminophen (TYLENOL) 500 MG tablet Take 1,000-1,500 mg by mouth every 8 (eight) hours as needed for mild pain, moderate pain, fever or headache.    Yes Historical Provider, MD  Cholecalciferol (VITAMIN D) 2000 units CAPS Take 1 capsule (2,000 Units total) by mouth daily. 02/09/16  Yes Quentin Angst, MD  famotidine (PEPCID) 20 MG tablet Take 20 mg by mouth daily as needed for heartburn or indigestion.    Yes Historical Provider, MD  fluticasone (FLONASE) 50 MCG/ACT nasal spray Place 2 sprays into both nostrils daily. Patient taking differently: Place 2 sprays into both nostrils daily as needed for allergies.  05/04/16  Yes Albertine Grates, MD  folic acid (FOLVITE) 1 MG tablet Take 1 tablet (1 mg total) by mouth daily. 02/09/16  Yes Quentin Angst, MD  hydroxyurea (HYDREA) 500 MG capsule Take 2 capsules (1,000 mg total) by mouth daily. May take with food to minimize GI side effects. 02/09/16  Yes [name] Hyman Hopes, MD  JADENU 360 MG TABS Take 720 mg by mouth daily.  09/07/16  Yes Historical Provider, MD  ondansetron (ZOFRAN) 4 MG tablet Take 1 tablet (4 mg total) by mouth every 6 (six) hours. Patient taking differently: Take 4 mg by mouth every 6 (six) hours as needed for nausea or vomiting.  12/21/15  Yes Eyvonne Mechanic, PA-C  oxyCODONE-acetaminophen (PERCOCET) [phone]  MG tablet Take 1-2 tablets by mouth every 6 (six) hours as needed for pain. 05/04/16  Yes Albertine Grates, MD  vitamin B-12 (CYANOCOBALAMIN) 1000 MCG tablet Take 1,000 mcg by mouth daily.   Yes Historical Provider, MD    Family History Family History  Problem Relation Age of Onset  . Diabetes Mother   . Hypertension Father   . Lupus Sister     Social History Social History  Substance Use Topics  . Smoking status: Former Games developer  . Smokeless tobacco: Never Used  . Alcohol use No     Comment: occasionally     Allergies   Patient has no known allergies.   Review of Systems Review of Systems  Constitutional: Negative for  chills, diaphoresis, fatigue and fever.  HENT: Negative for congestion, rhinorrhea and sneezing.   Eyes: Negative.   Respiratory: Negative for cough, chest tightness and shortness of breath.   Cardiovascular: Negative for chest pain and leg swelling.  Gastrointestinal: Negative for abdominal pain, blood in stool, diarrhea, nausea and vomiting.  Genitourinary: Negative for difficulty urinating, flank pain, frequency and hematuria.  Musculoskeletal: Positive for back pain. Negative for arthralgias.  Skin: Negative for rash.  Neurological: Negative for dizziness, speech difficulty, weakness, numbness and headaches.     Physical Exam Updated Vital Signs BP 119/77 (BP Location: Right Arm)   Pulse 65   Temp 97.7 F (36.5 C) (Oral)   Resp 14   SpO2 93%   Physical Exam  Constitutional: He is oriented to person, place, and time. He appears well-developed and well-nourished.  HENT:  Head: Normocephalic and atraumatic.  Eyes: Pupils are equal, round, and reactive to light.  Neck: Normal range of motion. Neck supple.  Cardiovascular: Normal rate, regular rhythm and normal heart sounds.   Pulmonary/Chest: Effort normal and breath sounds normal. No respiratory distress. He has no wheezes. He has no rales. He exhibits no tenderness.  Abdominal: Soft. Bowel sounds are normal. There is no tenderness. There is no rebound and no guarding.  Musculoskeletal: Normal range of motion. He exhibits no edema.  No pain along the lumbar spine. There is pain right over the coccyx.  Lymphadenopathy:    He has no cervical adenopathy.  Neurological: He is alert and oriented to person, place, and time.  Normal sensation and motor function in the lower extremities  Skin: Skin is warm and dry. No rash noted.  Psychiatric: He has a normal mood and affect.     ED Treatments / Results  Labs (all labs ordered are listed, but only abnormal results are displayed) Labs Reviewed - No data to display  EKG  EKG  Interpretation None       Radiology Dg Sacrum/coccyx  Result Date: 11/04/2016 CLINICAL DATA:  Tail bone pain for 2 weeks, no known injury, initial encounter EXAM: SACRUM AND COCCYX - 2+ VIEW COMPARISON:  None. FINDINGS: There is no evidence of fracture or other focal bone lesions. IMPRESSION: No acute abnormality noted. Electronically Signed   By: Alcide Clever M.D.   On: 11/04/2016 19:37    Procedures Procedures (including critical care time)  Medications Ordered in ED Medications  HYDROmorphone (DILAUDID) injection 1 mg (1 mg Intramuscular Given 11/04/16 1934)     Initial Impression / Assessment and Plan / ED Course  I have reviewed the triage vital signs and the nursing notes.  Pertinent labs & imaging results that were available during my care of the patient were reviewed by me and considered in my medical  decision making (see chart for details).     Patient presents with pain localized to his tailbone. There is no other pain. He is neurologically intact. It does not seem consistent with the sickle cell pain crises. It's been going on for about 2 weeks. It hurts when he sits on it. I don't find any signs of infection around the area. He states he's fallen on in the past. There is no evidence of bony injury. He was discharged home in good condition. He was given 1 dose of Dilaudid in the ED with good pain control. He was advised to follow-up with his sickle cell clinic if his symptoms are not improving or return here as needed for any worsening symptoms.  Final Clinical Impressions(s) / ED Diagnoses   Final diagnoses:  Contusion of coccyx, initial encounter    New Prescriptions New Prescriptions   No medications on file     Rolan BuccoMelanie Louna Rothgeb, MD 11/04/16 2019

## 2016-11-04 NOTE — ED Notes (Addendum)
Pt reports low back pain which he started to have 2 weeks ago, took his pain med with relief.  Pain recurred today.  Pt's sclera appears jaundiced which the pt reports it's not new.

## 2016-11-04 NOTE — ED Triage Notes (Signed)
patient c/o lower back pain x 2 weeks.  Patient states that he has fallen on his buttocks back in 2015.  Patient staets pain is worse when he is sitting.

## 2016-11-07 ENCOUNTER — Telehealth: Payer: Self-pay | Admitting: *Deleted

## 2016-11-07 NOTE — Telephone Encounter (Signed)
Patient verified DOB Patient is aware of heart US showing mild left side dilation. Patient advised to increase exercise as tolerated. Patient also aware of function being normal and no changes in treatment being made. Patient expressed his understanding and had no further questions at this time.

## 2016-11-07 NOTE — Telephone Encounter (Signed)
-----   Message from Quentin Angstlugbemiga E [name], MD sent at 11/04/2016 10:57 AM EST ----- Please inform patient that his heart ultrasound showed mildly dilated left side of the heart, most likely related to sickle cell anemia, function is normal. No change in treatment.

## 2016-11-21 ENCOUNTER — Ambulatory Visit (INDEPENDENT_AMBULATORY_CARE_PROVIDER_SITE_OTHER): Payer: Medicaid Other | Admitting: Otolaryngology

## 2016-11-21 DIAGNOSIS — J310 Chronic rhinitis: Secondary | ICD-10-CM

## 2016-11-21 DIAGNOSIS — J342 Deviated nasal septum: Secondary | ICD-10-CM

## 2016-11-21 DIAGNOSIS — J343 Hypertrophy of nasal turbinates: Secondary | ICD-10-CM

## 2017-01-10 ENCOUNTER — Encounter (HOSPITAL_COMMUNITY): Payer: Self-pay | Admitting: Emergency Medicine

## 2017-01-10 ENCOUNTER — Emergency Department (HOSPITAL_COMMUNITY): Payer: Medicare Other

## 2017-01-10 ENCOUNTER — Emergency Department (HOSPITAL_COMMUNITY)
Admission: EM | Admit: 2017-01-10 | Discharge: 2017-01-10 | Disposition: A | Discharge status: Home / Self Care | Payer: Medicare Other | Attending: Emergency Medicine | Admitting: Emergency Medicine

## 2017-01-10 DIAGNOSIS — Y939 Activity, unspecified: Secondary | ICD-10-CM | POA: Insufficient documentation

## 2017-01-10 DIAGNOSIS — Y999 Unspecified external cause status: Secondary | ICD-10-CM | POA: Diagnosis not present

## 2017-01-10 DIAGNOSIS — Y92410 Unspecified street and highway as the place of occurrence of the external cause: Secondary | ICD-10-CM | POA: Insufficient documentation

## 2017-01-10 DIAGNOSIS — M542 Cervicalgia: Secondary | ICD-10-CM | POA: Diagnosis not present

## 2017-01-10 DIAGNOSIS — Z79899 Other long term (current) drug therapy: Secondary | ICD-10-CM | POA: Insufficient documentation

## 2017-01-10 DIAGNOSIS — R930 Abnormal findings on diagnostic imaging of skull and head, not elsewhere classified: Secondary | ICD-10-CM | POA: Insufficient documentation

## 2017-01-10 DIAGNOSIS — S199XXA Unspecified injury of neck, initial encounter: Secondary | ICD-10-CM | POA: Diagnosis present

## 2017-01-10 DIAGNOSIS — Z87891 Personal history of nicotine dependence: Secondary | ICD-10-CM | POA: Insufficient documentation

## 2017-01-10 DIAGNOSIS — N183 Chronic kidney disease, stage 3 (moderate): Secondary | ICD-10-CM | POA: Insufficient documentation

## 2017-01-10 MED ORDER — CYCLOBENZAPRINE HCL 10 MG PO TABS
10.0000 mg | ORAL_TABLET | Freq: Once | ORAL | Status: AC
  Administered 2017-01-10: 10 mg via ORAL
  Filled 2017-01-10: qty 1

## 2017-01-10 MED ORDER — OXYCODONE-ACETAMINOPHEN 5-325 MG PO TABS
1.0000 | ORAL_TABLET | Freq: Once | ORAL | Status: AC
  Administered 2017-01-10: 1 via ORAL
  Filled 2017-01-10: qty 1

## 2017-01-10 MED ORDER — CYCLOBENZAPRINE HCL 10 MG PO TABS: 10.0000 mg | ORAL_TABLET | Freq: Two times a day (BID) | ORAL | 0 refills | Status: DC | PRN

## 2017-01-10 NOTE — ED Triage Notes (Signed)
Pt states this is not sickle cell related pain.
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
## 2017-01-10 NOTE — Discharge Instructions (Signed)
Your workup today did not show acute injuries. Please take a muscle relaxant to add to your home pain medications. Please follow-up with your primary care physician for further management. If any symptoms change or worsen, please return to the nearest emergency department.

## 2017-01-10 NOTE — ED Notes (Signed)
Discharge instructions, follow up care, and rx x1 reviewed with patient. Patient verbalized understanding. 

## 2017-01-10 NOTE — ED Notes (Signed)
Placed pt on 2 L [place] due to low oxygen saturations.
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
## 2017-01-16 ENCOUNTER — Ambulatory Visit (INDEPENDENT_AMBULATORY_CARE_PROVIDER_SITE_OTHER): Payer: Self-pay | Admitting: Otolaryngology

## 2017-03-16 DIAGNOSIS — G8929 Other chronic pain: Secondary | ICD-10-CM | POA: Diagnosis not present

## 2017-03-16 DIAGNOSIS — R51 Headache: Secondary | ICD-10-CM | POA: Diagnosis not present

## 2017-03-16 DIAGNOSIS — D571 Sickle-cell disease without crisis: Secondary | ICD-10-CM | POA: Diagnosis not present

## 2017-03-16 DIAGNOSIS — R110 Nausea: Secondary | ICD-10-CM | POA: Diagnosis not present

## 2017-03-16 DIAGNOSIS — J4540 Moderate persistent asthma, uncomplicated: Secondary | ICD-10-CM | POA: Diagnosis not present

## 2017-03-16 DIAGNOSIS — M791 Myalgia: Secondary | ICD-10-CM | POA: Diagnosis not present

## 2017-03-16 DIAGNOSIS — Z872 Personal history of diseases of the skin and subcutaneous tissue: Secondary | ICD-10-CM | POA: Diagnosis not present

## 2017-03-16 DIAGNOSIS — D631 Anemia in chronic kidney disease: Secondary | ICD-10-CM | POA: Diagnosis not present

## 2017-03-16 DIAGNOSIS — N183 Chronic kidney disease, stage 3 (moderate): Secondary | ICD-10-CM | POA: Diagnosis not present

## 2017-03-16 DIAGNOSIS — L739 Follicular disorder, unspecified: Secondary | ICD-10-CM | POA: Diagnosis not present

## 2017-03-16 DIAGNOSIS — L02821 Furuncle of head [any part, except face]: Secondary | ICD-10-CM | POA: Diagnosis not present

## 2017-03-16 DIAGNOSIS — D869 Sarcoidosis, unspecified: Secondary | ICD-10-CM | POA: Diagnosis not present

## 2017-03-16 DIAGNOSIS — E559 Vitamin D deficiency, unspecified: Secondary | ICD-10-CM | POA: Diagnosis not present

## 2017-03-17 ENCOUNTER — Ambulatory Visit: Payer: Self-pay | Admitting: Family Medicine

## 2017-03-21 ENCOUNTER — Encounter: Payer: Self-pay | Admitting: Family Medicine

## 2017-03-21 ENCOUNTER — Ambulatory Visit (INDEPENDENT_AMBULATORY_CARE_PROVIDER_SITE_OTHER): Payer: Medicare Other | Admitting: Family Medicine

## 2017-03-21 VITALS — BP 112/76 | HR 94 | Temp 98.2°F | Resp 16 | Ht 72.0 in | Wt 131.0 lb

## 2017-03-21 DIAGNOSIS — R0982 Postnasal drip: Secondary | ICD-10-CM

## 2017-03-21 DIAGNOSIS — D571 Sickle-cell disease without crisis: Secondary | ICD-10-CM

## 2017-03-21 DIAGNOSIS — N183 Chronic kidney disease, stage 3 unspecified: Secondary | ICD-10-CM

## 2017-03-21 DIAGNOSIS — Z9109 Other allergy status, other than to drugs and biological substances: Secondary | ICD-10-CM

## 2017-03-21 DIAGNOSIS — R0981 Nasal congestion: Secondary | ICD-10-CM | POA: Diagnosis not present

## 2017-03-21 MED ORDER — JADENU 360 MG PO TABS: 720.0000 mg | ORAL_TABLET | Freq: Every day | ORAL | 0 refills | Status: DC

## 2017-03-21 MED ORDER — LEVOCETIRIZINE DIHYDROCHLORIDE 5 MG PO TABS: 5.0000 mg | ORAL_TABLET | Freq: Every evening | ORAL | 5 refills | Status: DC

## 2017-03-21 MED ORDER — SALINE 0.9 % NA AERS: 1.0000 | INHALATION_SPRAY | Freq: Two times a day (BID) | NASAL | 5 refills | Status: DC | PRN

## 2017-03-21 NOTE — Progress Notes (Signed)
Subjective:    Patient ID: Shawn Adams Groleau, male    DOB: 1976/02/22, 41 y.o.   MRN: [id number]   HPI Shawn Adams, a 41 year old male with a history of sickle cell anemia, HbSS and stage III CKD presents complaining of nasal congestion and post nasal drip. Patient's symptoms include itchy eyes, itchy nose, nasal congestion and postnasal drip. These symptoms have been present over the past several months. Patient has not identified precipitant of symptoms.  He says that symptoms are primarily present upon awakening.  Shawn Adams has not attempted any OTC interventions to alleviate symptoms.  Shawn Adams also has a history of sickle cell anemia. He is followed by hematology. His most recent appointment was on 03/17/2017. He says that he is not having pain on today. He is taking folic acid and hydroxyurea consistently. He denies headache, chest pain, shortness of breath, nausea, vomiting, or diarrhea.  Past Medical History:  Diagnosis Date  . Aneurysm (HCC)    Pt states happened in 2011, Brain   . Heart murmur   . Sarcoid   . Sickle cell anemia (HCC)    Social History   Social History  . Marital status: Single    Spouse name: N/A  . Number of children: N/A  . Years of education: N/A   Occupational History  . Not on file.   Social History Main Topics  . Smoking status: Former Games developermoker  . Smokeless tobacco: Never Used  . Alcohol use No     Comment: occasionally  . Drug use: No     Comment: Pt states uses 2 times a day  . Sexual activity: Yes    Birth control/ protection: None   Other Topics Concern  . Not on file   Social History Narrative  . No narrative on file   Immunization History  Administered Date(s) Administered  . Influenza,inj,Quad PF,36+ Mos 06/21/2014, 07/08/2015  . Influenza,inj,quad, With Preservative 05/11/2016  . Tdap 07/08/2015   Review of Systems  Constitutional: Negative for fatigue, fever and unexpected weight change.  HENT: Positive for congestion and  postnasal drip. Negative for sinus pressure and sore throat.   Eyes: Positive for itching. Negative for visual disturbance.  Respiratory: Negative.  Negative for chest tightness and shortness of breath.   Cardiovascular: Negative.   Gastrointestinal: Negative.  Negative for nausea and vomiting.  Endocrine: Negative.  Negative for polydipsia, polyphagia and polyuria.  Genitourinary: Negative.  Negative for genital sores, hematuria, penile pain and testicular pain.  Musculoskeletal: Positive for myalgias.  Skin: Negative.  Negative for rash and wound.  Allergic/Immunologic: Negative.   Neurological: Negative for dizziness, tremors and weakness.  Hematological: Negative.   Psychiatric/Behavioral: Negative.  Negative for sleep disturbance and suicidal ideas.      Objective:   Physical Exam  Constitutional: He is oriented to person, place, and time.  HENT:  Head: Normocephalic and atraumatic.  Right Ear: External ear normal.  Nose: Mucosal edema present.  Mouth/Throat: Oropharynx is clear and moist.  Sore to right nare  Eyes: Conjunctivae and EOM are normal. Pupils are equal, round, and reactive to light.  Neck: Normal range of motion. Neck supple.  Cardiovascular: Normal rate, regular rhythm, normal heart sounds and intact distal pulses.   Pulmonary/Chest: Effort normal and breath sounds normal.  Abdominal: Soft. Bowel sounds are normal.  Neurological: He is alert and oriented to person, place, and time. He has normal strength and normal reflexes. No cranial nerve deficit or sensory deficit. Coordination and gait  normal.  Skin: Skin is warm and dry.  Psychiatric: He has a normal mood and affect. His behavior is normal. Judgment and thought content normal.      BP 112/76 (BP Location: Left Arm, Patient Position: Sitting, Cuff Size: Small)   Pulse 94   Temp 98.2 F (36.8 C) (Oral)   Resp 16   Ht 6' (1.829 m)   Wt 131 lb (59.4 kg)   SpO2 91%   BMI 17.77 kg/m  Assessment & Plan:    1. Nasal congestion Patient has sore to right nare.  Refrain from irritating nasal mucosa.   - Saline 0.9 % AERS; Place 1 each into the nose 2 (two) times daily as needed.  Dispense: 1 Can; Refill: 5  2. Post-nasal drip - Saline 0.9 % AERS; Place 1 each into the nose 2 (two) times daily as needed.  Dispense: 1 Can; Refill: 5  3. Environmental allergies - levocetirizine (XYZAL) 5 MG tablet; Take 1 tablet (5 mg total) by mouth every evening.  Dispense: 30 tablet; Refill: 5  4. Hb-SS disease without crisis [facility])  Patient is under the care of Newman Pies, hematology. Will continue hydroxyurea as prescribed. We discussed the need for good hydration, monitoring of hydration status, avoidance of heat, cold, stress, and infection triggers. We discussed the risks and benefits of Hydrea, including bone marrow suppression, the possibility of GI upset, skin ulcers, hair thinning, and teratogenicity. The patient was reminded of the need to seek medical attention of any symptoms of bleeding, anemia, or infection. Continue folic acid 1 mg daily to prevent aplastic bone marrow crises.   Pulmonary evaluation - Patient denies severe recurrent wheezes, shortness of breath with exercise, or persistent cough. If these symptoms develop, pulmonary function tests with spirometry will be ordered, and if abnormal, plan on referral to Pulmonology for further evaluation.  Cardiac - Routine screening for pulmonary hypertension is not recommended.  Eye - High risk of proliferative retinopathy. Annual eye exam with retinal exam recommended to patient. Will send referral to opthalmology  Renal: Proteinuria present, Stage III CKG, recommend scheduling follow-up with Dr. Pamalee Leyden.  Immunization status - Up to date with vaccinations  - JADENU 360 MG TABS; Take 720 mg by mouth daily.  Dispense: 90 tablet; Refill: 0  5. CKD (chronic kidney disease) stage 3, GFR 30-59 ml/min Patient is followed by Dr. Jean Rosenthal, nephrologist,  [facility]. Reviewed previous notes. Recommend that patient follow up with nephrology. He says that he scheduled an appointment which is in August - POCT urinalysis dip (device)    The patient was given clear instructions to go to ER or return to medical center if symptoms do not improve, worsen or new problems develop. The patient verbalized understanding. Will notify patient with laboratory results.  Nolon Nations  MSN, FNP-C [facility] Patient [facility] [address] [place], [place] [address] [phone]

## 2017-03-21 NOTE — Patient Instructions (Addendum)
Nasal congestion:  Recommend simply saline twice daily.  Post nasal drip:  Xyzal 5 mg at bedtime     Allergies An allergy is when your body reacts to a substance in a way that is not normal. An allergic reaction can happen after you:  Eat something.  Breathe in something.  Touch something.  You can be allergic to:  Things that are only around during certain seasons, like molds and pollens.  Foods.  Drugs.  Insects.  Animal dander.  What are the signs or symptoms?  Puffiness (swelling). This may happen on the lips, face, tongue, mouth, or throat.  Sneezing.  Coughing.  Breathing loudly (wheezing).  Stuffy nose.  Tingling in the mouth.  A rash.  Itching.  Itchy, red, puffy areas of skin (hives).  Watery eyes.  Throwing up (vomiting).  Watery poop (diarrhea).  Dizziness.  Feeling faint or fainting.  Trouble breathing or swallowing.  A tight feeling in the chest.  A fast heartbeat. How is this diagnosed? Allergies can be diagnosed with:  A medical and family history.  Skin tests.  Blood tests.  A food diary. A food diary is a record of all the foods, drinks, and symptoms you have each day.  The results of an elimination diet. This diet involves making sure not to eat certain foods and then seeing what happens when you start eating them again.  How is this treated? There is no cure for allergies, but allergic reactions can be treated with medicine. Severe reactions usually need to be treated at a hospital. How is this prevented? The best way to prevent an allergic reaction is to avoid the thing you are allergic to. Allergy shots and medicines can also help prevent reactions in some cases. This information is not intended to replace advice given to you by your health care provider. Make sure you discuss any questions you have with your health care provider. Document Released: 01/07/2013 Document Revised: 05/09/2016 Document Reviewed:  06/24/2014 Elsevier Interactive Patient Education  2018 [employer] Levocetirizine Oral Tablets What is this medicine? LEVOCETIRIZINE (LEE voe se TIR i zeen) is an antihistamine. This medicine is used to treat or prevent symptoms of allergies. It is also used to help reduce itchy skin rash and hives. This medicine may be used for other purposes; ask your health care provider or pharmacist if you have questions. COMMON BRAND NAME(S): Xyzal, Xyzal Allergy 24 Hour What should I tell my health care provider before I take this medicine? They need to know if you have any of these conditions: -kidney disease -an unusual or allergic reaction to levocetirizine, cetirizine, hydroxyzine, other medicines, foods, dyes, or preservatives -pregnant or trying to get pregnant -breast-feeding How should I use this medicine? Take this medicine by mouth with a glass of water. Take it at night. The tablet may be split in half. Do not chew the tablets. Follow the directions on the prescription label. You can take it with or without food. Do not take more medicine than directed. You may need to take this medicine for several days before your symptoms improve. Talk to your pediatrician regarding the use of this medicine in children. While this drug may be prescribed for children as young as 41 years old for selected conditions, precautions do apply. Overdosage: If you think you have taken too much of this medicine contact a poison control center or emergency room at once. NOTE: This medicine is only for you. Do not share this medicine with others. What  if I miss a dose? If you miss a dose, take it as soon as you can. If it is almost time for your next dose, take only that dose. Do not take double or extra doses. What may interact with this medicine? -alcohol -MAOIs like Carbex, Eldepryl, Marplan, Nardil, and Parnate -medicines that cause drowsiness or sleep -other medicines for colds or  allergies -ritonavir -theophylline This list may not describe all possible interactions. Give your health care provider a list of all the medicines, herbs, non-prescription drugs, or dietary supplements you use. Also tell them if you smoke, drink alcohol, or use illegal drugs. Some items may interact with your medicine. What should I watch for while using this medicine? Visit your doctor or health care professional for regular checks on your health. Tell your doctor or healthcare professional if your symptoms do not start to get better or if they get worse. You may get drowsy or dizzy. Do not drive, use machinery, or do anything that needs mental alertness until you know how this medicine affects you. Do not stand or sit up quickly, especially if you are an older patient. This reduces the risk of dizzy or fainting spells. Alcohol may interfere with the effect of this medicine. Avoid alcoholic drinks. Your mouth may get dry. Chewing sugarless gum or sucking hard candy, and drinking plenty of water may help. Contact your doctor if the problem does not go away or is severe. What side effects may I notice from receiving this medicine? Side effects that you should report to your doctor or health care professional as soon as possible: -allergic reactions like skin rash, itching or hives, swelling of the face, lips, or tongue -changes in vision or hearing -fever -trouble passing urine or change in the amount of urine Side effects that usually do not require medical attention (report to your doctor or health care professional if they continue or are bothersome): -cough -dizziness -drowsiness or tiredness -dry mouth -muscle aches This list may not describe all possible side effects. Call your doctor for medical advice about side effects. You may report side effects to FDA at 1-800-FDA-1088. Where should I keep my medicine? Keep out of the reach of children. Store at room temperature between 15 and 30  degrees C (59 and 86 degrees F). Throw away any unused medicine after the expiration date. NOTE: This sheet is a summary. It may not cover all possible information. If you have questions about this medicine, talk to your doctor, pharmacist, or health care provider.  2018 Elsevier/Gold Standard (2008-06-04 11:17:47)

## 2017-03-24 LAB — POCT URINALYSIS DIP (DEVICE)
Bilirubin Urine: NEGATIVE
Glucose, UA: NEGATIVE mg/dL
Ketones, ur: NEGATIVE mg/dL
LEUKOCYTES UA: NEGATIVE
Nitrite: NEGATIVE
Protein, ur: 30 mg/dL — AB
SPECIFIC GRAVITY, URINE: 1.015 (ref 1.005–1.030)
Urobilinogen, UA: 1 mg/dL (ref 0.0–1.0)
pH: 6.5 (ref 5.0–8.0)

## 2017-04-04 DIAGNOSIS — H04129 Dry eye syndrome of unspecified lacrimal gland: Secondary | ICD-10-CM | POA: Diagnosis not present

## 2017-04-04 DIAGNOSIS — H11133 Conjunctival pigmentations, bilateral: Secondary | ICD-10-CM | POA: Diagnosis not present

## 2017-04-04 DIAGNOSIS — H527 Unspecified disorder of refraction: Secondary | ICD-10-CM | POA: Diagnosis not present

## 2017-04-04 DIAGNOSIS — H40003 Preglaucoma, unspecified, bilateral: Secondary | ICD-10-CM | POA: Diagnosis not present

## 2017-04-04 DIAGNOSIS — H11153 Pinguecula, bilateral: Secondary | ICD-10-CM | POA: Diagnosis not present

## 2017-04-04 DIAGNOSIS — D571 Sickle-cell disease without crisis: Secondary | ICD-10-CM | POA: Diagnosis not present

## 2017-04-04 DIAGNOSIS — H18893 Other specified disorders of cornea, bilateral: Secondary | ICD-10-CM | POA: Diagnosis not present

## 2017-04-04 DIAGNOSIS — H04123 Dry eye syndrome of bilateral lacrimal glands: Secondary | ICD-10-CM | POA: Diagnosis not present

## 2017-04-04 DIAGNOSIS — Z87891 Personal history of nicotine dependence: Secondary | ICD-10-CM | POA: Diagnosis not present

## 2017-04-05 ENCOUNTER — Telehealth: Payer: Self-pay

## 2017-04-05 DIAGNOSIS — R0981 Nasal congestion: Secondary | ICD-10-CM

## 2017-04-05 DIAGNOSIS — Z9109 Other allergy status, other than to drugs and biological substances: Secondary | ICD-10-CM

## 2017-04-05 DIAGNOSIS — R0982 Postnasal drip: Secondary | ICD-10-CM

## 2017-04-05 DIAGNOSIS — D571 Sickle-cell disease without crisis: Secondary | ICD-10-CM

## 2017-04-05 MED ORDER — LEVOCETIRIZINE DIHYDROCHLORIDE 5 MG PO TABS: 5.0000 mg | ORAL_TABLET | Freq: Every evening | ORAL | 5 refills | Status: DC

## 2017-04-05 MED ORDER — JADENU 360 MG PO TABS: 720.0000 mg | ORAL_TABLET | Freq: Every day | ORAL | 0 refills | Status: DC

## 2017-04-05 MED ORDER — SALINE 0.9 % NA AERS: 1.0000 | INHALATION_SPRAY | Freq: Two times a day (BID) | NASAL | 5 refills | Status: DC | PRN

## 2017-04-12 DIAGNOSIS — H04123 Dry eye syndrome of bilateral lacrimal glands: Secondary | ICD-10-CM | POA: Diagnosis not present

## 2017-04-12 DIAGNOSIS — H40003 Preglaucoma, unspecified, bilateral: Secondary | ICD-10-CM | POA: Diagnosis not present

## 2017-04-12 DIAGNOSIS — H16143 Punctate keratitis, bilateral: Secondary | ICD-10-CM | POA: Diagnosis not present

## 2017-04-12 DIAGNOSIS — H11153 Pinguecula, bilateral: Secondary | ICD-10-CM | POA: Diagnosis not present

## 2017-04-12 DIAGNOSIS — H04129 Dry eye syndrome of unspecified lacrimal gland: Secondary | ICD-10-CM | POA: Diagnosis not present

## 2017-04-12 DIAGNOSIS — Z87891 Personal history of nicotine dependence: Secondary | ICD-10-CM | POA: Diagnosis not present

## 2017-04-12 DIAGNOSIS — H527 Unspecified disorder of refraction: Secondary | ICD-10-CM | POA: Diagnosis not present

## 2017-04-12 DIAGNOSIS — H11133 Conjunctival pigmentations, bilateral: Secondary | ICD-10-CM | POA: Diagnosis not present

## 2017-05-01 DIAGNOSIS — N183 Chronic kidney disease, stage 3 (moderate): Secondary | ICD-10-CM | POA: Diagnosis not present

## 2017-06-27 ENCOUNTER — Encounter (HOSPITAL_COMMUNITY): Payer: Self-pay

## 2017-06-27 DIAGNOSIS — Z7983 Long term (current) use of bisphosphonates: Secondary | ICD-10-CM | POA: Insufficient documentation

## 2017-06-27 DIAGNOSIS — D5700 Hb-SS disease with crisis, unspecified: Secondary | ICD-10-CM | POA: Diagnosis not present

## 2017-06-27 DIAGNOSIS — D649 Anemia, unspecified: Secondary | ICD-10-CM | POA: Insufficient documentation

## 2017-06-27 DIAGNOSIS — Z87891 Personal history of nicotine dependence: Secondary | ICD-10-CM | POA: Insufficient documentation

## 2017-06-27 DIAGNOSIS — N289 Disorder of kidney and ureter, unspecified: Secondary | ICD-10-CM | POA: Diagnosis not present

## 2017-06-27 DIAGNOSIS — N183 Chronic kidney disease, stage 3 (moderate): Secondary | ICD-10-CM | POA: Insufficient documentation

## 2017-06-27 DIAGNOSIS — Z79899 Other long term (current) drug therapy: Secondary | ICD-10-CM | POA: Insufficient documentation

## 2017-06-27 NOTE — ED Notes (Signed)
Pt placed on 2L nasal cannula.

## 2017-06-27 NOTE — ED Triage Notes (Signed)
Pt complains of back and flank pain for two days He has hx of sickle cell dx and kidney disease Pt's O2 was low in triage, placed on 2 liters

## 2017-06-28 ENCOUNTER — Emergency Department (HOSPITAL_COMMUNITY): Payer: Self-pay

## 2017-06-28 ENCOUNTER — Emergency Department (HOSPITAL_COMMUNITY)
Admission: EM | Admit: 2017-06-28 | Discharge: 2017-06-28 | Disposition: A | Discharge status: Home / Self Care | Payer: Self-pay | Attending: Emergency Medicine | Admitting: Emergency Medicine

## 2017-06-28 DIAGNOSIS — N289 Disorder of kidney and ureter, unspecified: Secondary | ICD-10-CM

## 2017-06-28 DIAGNOSIS — D5700 Hb-SS disease with crisis, unspecified: Secondary | ICD-10-CM

## 2017-06-28 LAB — CBC WITH DIFFERENTIAL/PLATELET
BASOS ABS: 0.1 10*3/uL (ref 0.0–0.1)
Basophils Relative: 1 %
EOS PCT: 3 %
Eosinophils Absolute: 0.2 10*3/uL (ref 0.0–0.7)
HEMATOCRIT: 14.3 % — AB (ref 39.0–52.0)
HEMOGLOBIN: 5.5 g/dL — AB (ref 13.0–17.0)
LYMPHS ABS: 2.2 10*3/uL (ref 0.7–4.0)
LYMPHS PCT: 41 %
MCH: 39.6 pg — ABNORMAL HIGH (ref 26.0–34.0)
MCHC: 38.5 g/dL — ABNORMAL HIGH (ref 30.0–36.0)
MCV: 102.9 fL — AB (ref 78.0–100.0)
Monocytes Absolute: 0.6 10*3/uL (ref 0.1–1.0)
Monocytes Relative: 11 %
NEUTROS ABS: 2.3 10*3/uL (ref 1.7–7.7)
Neutrophils Relative %: 44 %
Platelets: UNDETERMINED 10*3/uL (ref 150–400)
RBC: 1.39 MIL/uL — AB (ref 4.22–5.81)
RDW: 22.8 % — AB (ref 11.5–15.5)
WBC: 5.4 10*3/uL (ref 4.0–10.5)

## 2017-06-28 LAB — COMPREHENSIVE METABOLIC PANEL
ALBUMIN: 3.7 g/dL (ref 3.5–5.0)
ALT: 24 U/L (ref 17–63)
ANION GAP: 8 (ref 5–15)
AST: 55 U/L — AB (ref 15–41)
Alkaline Phosphatase: 144 U/L — ABNORMAL HIGH (ref 38–126)
BUN: 20 mg/dL (ref 6–20)
CHLORIDE: 108 mmol/L (ref 101–111)
CO2: 22 mmol/L (ref 22–32)
Calcium: 9.8 mg/dL (ref 8.9–10.3)
Creatinine, Ser: 1.69 mg/dL — ABNORMAL HIGH (ref 0.61–1.24)
GFR calc Af Amer: 57 mL/min — ABNORMAL LOW (ref >60)
GFR, EST NON AFRICAN AMERICAN: 49 mL/min — AB (ref >60)
GLUCOSE: 96 mg/dL (ref 65–99)
POTASSIUM: 3.7 mmol/L (ref 3.5–5.1)
Sodium: 138 mmol/L (ref 135–145)
TOTAL PROTEIN: 7.6 g/dL (ref 6.5–8.1)
Total Bilirubin: 2.9 mg/dL — ABNORMAL HIGH (ref 0.3–1.2)

## 2017-06-28 LAB — RETICULOCYTES
RBC.: 1.39 MIL/uL — AB (ref 4.22–5.81)
RETIC COUNT ABSOLUTE: 257.2 10*3/uL — AB (ref 19.0–186.0)
Retic Ct Pct: 18.5 % — ABNORMAL HIGH (ref 0.4–3.1)

## 2017-06-28 MED ORDER — ONDANSETRON HCL 4 MG/2ML IJ SOLN
4.0000 mg | INTRAMUSCULAR | Status: DC | PRN
  Administered 2017-06-28: 4 mg via INTRAVENOUS
  Filled 2017-06-28: qty 2

## 2017-06-28 MED ORDER — HYDROMORPHONE HCL 1 MG/ML IJ SOLN: 2.0000 mg | INTRAMUSCULAR | Status: AC

## 2017-06-28 MED ORDER — DIPHENHYDRAMINE HCL 50 MG/ML IJ SOLN
25.0000 mg | Freq: Once | INTRAMUSCULAR | Status: AC
  Administered 2017-06-28: 25 mg via INTRAVENOUS
  Filled 2017-06-28: qty 1

## 2017-06-28 MED ORDER — DEXTROSE-NACL 5-0.45 % IV SOLN
INTRAVENOUS | Status: DC
  Administered 2017-06-28: 03:00:00 via INTRAVENOUS

## 2017-06-28 MED ORDER — HYDROMORPHONE HCL 1 MG/ML IJ SOLN: 2.0000 mg | INTRAMUSCULAR | Status: DC

## 2017-06-28 MED ORDER — KETOROLAC TROMETHAMINE 15 MG/ML IJ SOLN
15.0000 mg | INTRAMUSCULAR | Status: AC
  Administered 2017-06-28: 15 mg via INTRAVENOUS
  Filled 2017-06-28: qty 1

## 2017-06-28 MED ORDER — HYDROMORPHONE HCL 1 MG/ML IJ SOLN
2.0000 mg | INTRAMUSCULAR | Status: DC
Start: 2017-06-28 — End: 2017-06-28

## 2017-06-28 MED ORDER — HYDROMORPHONE HCL 1 MG/ML IJ SOLN
2.0000 mg | INTRAMUSCULAR | Status: AC
  Administered 2017-06-28: 2 mg via INTRAVENOUS
  Filled 2017-06-28: qty 2

## 2017-06-28 MED ORDER — HYDROMORPHONE HCL 1 MG/ML IJ SOLN
2.0000 mg | INTRAMUSCULAR | Status: AC
Start: 2017-06-28 — End: 2017-06-28

## 2017-06-28 NOTE — ED Notes (Signed)
Pt stated cant void at this time but will try in 

## 2017-06-28 NOTE — ED Notes (Signed)
Date and time results received: 06/28/17 1:33 AM  (use smartphrase ".now" to insert current time)  Test: Hgb Critical Value: 5.5  Name of Provider Notified: EDP Preston Fleeting  Orders Received? Or Actions Taken?: Doctor to see patient

## 2017-06-28 NOTE — Discharge Instructions (Signed)
Continue taking your oxycodone as needed for pain. Return if pain is not being controlled at home, or if you start running a fever.

## 2017-06-28 NOTE — ED Notes (Signed)
Patient O2 dropped to 86% room air after medication, 2LNC started, O2 up to 90.

## 2017-06-28 NOTE — ED Provider Notes (Signed)
[facility]-EMERGENCY DEPT Provider Note   CSN: [id number] Arrival date & time: 06/27/17  2255     History   Chief Complaint Chief Complaint  Patient presents with  . Sickle Cell Pain Crisis  . Flank Pain    HPI Shawn Adams is a 41 y.o. male.  The history is provided by the patient.  he has a history of sickle cell disease and has been having pain in his lower back for the last 3 days. Pain is typical of a sickle sell crises. He rates pain at 10/10. Nothing makes it better, nothing makes it worse. Has been taking oxycodone 20 mg tablets without relief. He denies fever, chills, sweats. He denies cough. Denies chest pain. He denies nausea or vomiting or diarrhea. Denies any urinary difficulty.  Past Medical History:  Diagnosis Date  . Aneurysm (HCC)    Pt states happened in 2011, Brain   . Heart murmur   . Sarcoid   . Sickle cell anemia [facility])     Patient Active Problem List   Diagnosis Date Noted  . Headache 05/03/2016  . Anemia 05/03/2016  . Headache disorder   . Vitamin D deficiency 02/09/2016  . Heart murmur 02/09/2016  . Recurrent occipital headache 10/15/2015  . History of aneurysm 10/13/2015  . Left hip pain 07/30/2015  . Left ankle pain 07/08/2015  . Sickle cell crisis (HCC) 03/08/2015  . 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 Sickle cell anemia with crisis (HCC) 12/07/2014  . Sickle cell anemia (HCC) 06/20/2014  . CKD (chronic kidney disease) stage 3, GFR 30-59 ml/min (HCC) 06/20/2014  . Leukocytosis 06/20/2014  . Sickle cell pain crisis (HCC) 06/20/2014  . Hypercalcemia 06/20/2014  . Symptomatic anemia 05/31/2014    Past Surgical History:  Procedure Laterality Date  . LYMPH NODE BIOPSY         Home Medications    Prior to Admission medications   Medication Sig Start Date End Date Taking? Authorizing Provider  acetaminophen (TYLENOL) 500 MG tablet Take 1,000-1,500 mg by mouth every 8 (eight) hours as needed for mild pain, moderate pain, fever  or headache.    Yes [provider]  folic acid (FOLVITE) 1 MG tablet Take 1 tablet (1 mg total) by mouth daily. 02/09/16  Yes Quentin Angst, MD  hydroxyurea (HYDREA) 500 MG capsule Take 2 capsules (1,000 mg total) by mouth daily. May take with food to minimize GI side effects. 02/09/16  Yes [name], [name], MD  JADENU 360 MG TABS Take 720 mg by mouth daily. 04/05/17  Yes Massie Maroon, FNP  levocetirizine (XYZAL) 5 MG tablet Take 1 tablet (5 mg total) by mouth every evening. 04/05/17  Yes Massie Maroon, FNP  ondansetron (ZOFRAN) 4 MG tablet Take 1 tablet (4 mg total) by mouth every 6 (six) hours. Patient taking differently: Take 4 mg by mouth every 6 (six) hours as needed for nausea or vomiting.  12/21/15  Yes Hedges, Tinnie Gens, PA-C  Oxycodone HCl 20 MG TABS Take 10 mg by mouth every 4 (four) hours.   Yes [provider]  vitamin B-12 (CYANOCOBALAMIN) 1000 MCG tablet Take 1,000 mcg by mouth daily.   Yes [provider]  Vitamin D, Ergocalciferol, (DRISDOL) 50000 units CAPS capsule Take 50,000 Units by mouth every 7 (seven) days.   Yes [provider]    Family History Family History  Problem Relation Age of Onset  . Diabetes Mother   . Hypertension Father   . Lupus  Sister     Social History Social History  Substance Use Topics  . Smoking status: Former Games developer  . Smokeless tobacco: Never Used  . Alcohol use No     Comment: occasionally     Allergies   Patient has no known allergies.   Review of Systems Review of Systems  All other systems reviewed and are negative.    Physical Exam Updated Vital Signs BP 125/76 (BP Location: Right Arm)   Pulse 93   Temp 98.7 F (37.1 C) (Oral)   Resp 18   Ht 6' (1.829 m)   Wt 63.5 kg (140 lb)   SpO2 90%   BMI 18.99 kg/m   Physical Exam  Nursing note and vitals reviewed.  41 year old male, resting comfortably and in no acute distress. Vital signs are normal. Oxygen saturation is  90%, which is hypoxic. Head is normocephalic and atraumatic. PERRLA, EOMI. Oropharynx is clear. Neck is nontender and supple without adenopathy or JVD. Back is nontender and there is no CVA tenderness. Lungs are clear without rales, wheezes, or rhonchi. Chest is nontender. Heart has regular rate and rhythm without murmur. Abdomen is soft, flat, nontender without masses or hepatosplenomegaly and peristalsis is normoactive. Extremities have no cyanosis or edema, full range of motion is present. Skin is warm and dry without rash. Neurologic: Mental status is normal, cranial nerves are intact, there are no motor or sensory deficits.  ED Treatments / Results  Labs (all labs ordered are listed, but only abnormal results are displayed) Labs Reviewed  COMPREHENSIVE METABOLIC PANEL - Abnormal; Notable for the following:       Result Value   Creatinine, Ser 1.69 (*)    AST 55 (*)    Alkaline Phosphatase 144 (*)    Total Bilirubin 2.9 (*)    GFR calc non Af Amer 49 (*)    GFR calc Af Amer 57 (*)    All other components within normal limits  CBC WITH DIFFERENTIAL/PLATELET - Abnormal; Notable for the following:    RBC 1.39 (*)    Hemoglobin 5.5 (*)    HCT 14.3 (*)    MCV 102.9 (*)    MCH 39.6 (*)    MCHC 38.5 (*)    RDW 22.8 (*)    All other components within normal limits  RETICULOCYTES - Abnormal; Notable for the following:    Retic Ct Pct 18.5 (*)    RBC. 1.39 (*)    Retic Count, Absolute 257.2 (*)    All other components within normal limits  URINALYSIS, ROUTINE W REFLEX MICROSCOPIC    Radiology Dg Chest 2 View  Result Date: 06/28/2017 CLINICAL DATA:  Sickle cell crisis.  History of sarcoidosis. EXAM: CHEST  2 VIEW COMPARISON:  Chest radiograph January 10, 2017 FINDINGS: Cardiac silhouette is mildly enlarged and unchanged. Similar bulky mediastinal and pulmonary hila. Similar chronic interstitial changes without pleural effusion or focal consolidation. No pneumothorax. Soft tissue  planes and included osseous structures are nonsuspicious. With IMPRESSION: Stable mild cardiomegaly. Stable bulky hilar lymphadenopathy and chronic interstitial changes attributable attributed to patient's sarcoidosis. Electronically Signed   By: Awilda Metro M.D.   On: 06/28/2017 02:28    Procedures Procedures (including critical care time)  Medications Ordered in ED Medications  dextrose 5 %-0.45 % sodium chloride infusion ( Intravenous New Bag/Given 06/28/17 0244)  HYDROmorphone (DILAUDID) injection 2 mg (not administered)    Or  HYDROmorphone (DILAUDID) injection 2 mg (not administered)  ondansetron (ZOFRAN) injection 4 mg (  4 mg Intravenous Given 06/28/17 0244)  ketorolac (TORADOL) 15 MG/ML injection 15 mg (15 mg Intravenous Given 06/28/17 0244)  HYDROmorphone (DILAUDID) injection 2 mg (2 mg Intravenous Given 06/28/17 0243)    Or  HYDROmorphone (DILAUDID) injection 2 mg ( Subcutaneous See Alternative 06/28/17 0243)  HYDROmorphone (DILAUDID) injection 2 mg (2 mg Intravenous Given 06/28/17 0318)    Or  HYDROmorphone (DILAUDID) injection 2 mg ( Subcutaneous See Alternative 06/28/17 0318)  HYDROmorphone (DILAUDID) injection 2 mg (2 mg Intravenous Given 06/28/17 0359)    Or  HYDROmorphone (DILAUDID) injection 2 mg ( Subcutaneous See Alternative 06/28/17 0359)  diphenhydrAMINE (BENADRYL) injection 25 mg (25 mg Intravenous Given 06/28/17 0244)     Initial Impression / Assessment and Plan / ED Course  I have reviewed the triage vital signs and the nursing notes.  Pertinent labs & imaging results that were available during my care of the patient were reviewed by me and considered in my medical decision making (see chart for details).  Sickle cell disease with painful crisis old records are reviewed, and his last ED visit for sickle cell crisis was in March 2017. He started on sickle cell crisis protocol with IV hydromorphone. Laboratory evaluation shows significant anemia which is stable renal  insufficiency which is stable, elevated liver function tests which are stable. Curiously, he has developed macrocytosis.  He has achieved adequate pain relief with 3 doses of hydromorphone. He is discharged with discharge is to resume his pain management at home. Return precautions discussed.  Final Clinical Impressions(s) / ED Diagnoses   Final diagnoses:  Sickle cell pain crisis [facility])  Renal insufficiency    New Prescriptions New Prescriptions   No medications on file     Dione Booze, MD 06/28/17 [id number]

## 2017-06-28 NOTE — ED Notes (Signed)
Patient states that he has had left flank pain until Sunday. Unlike his normal crisis pain. States he has been using the bathroom more frequently and endorses "tingling" when he urinates.

## 2017-06-30 DIAGNOSIS — Z832 Family history of diseases of the blood and blood-forming organs and certain disorders involving the immune mechanism: Secondary | ICD-10-CM | POA: Diagnosis not present

## 2017-06-30 DIAGNOSIS — M549 Dorsalgia, unspecified: Secondary | ICD-10-CM | POA: Diagnosis not present

## 2017-06-30 DIAGNOSIS — Z87891 Personal history of nicotine dependence: Secondary | ICD-10-CM | POA: Diagnosis not present

## 2017-06-30 DIAGNOSIS — D5700 Hb-SS disease with crisis, unspecified: Secondary | ICD-10-CM | POA: Diagnosis not present

## 2017-08-13 IMAGING — CT CT HEAD W/O CM
4 series · 16 of 47 positions shown, 18 images · non-contrast
Comparison: None.

CLINICAL DATA: Headache 2 days

EXAM:
CT HEAD WITHOUT CONTRAST
TECHNIQUE: Contiguous axial images were obtained from the base of the skull
through the vertex without intravenous contrast.

[Series 2: head without · axial · non-contrast · 0.44mm/px · z∈[-99,+16]mm · 7 of 31 slices shown, 9 images]
[im 4/31  brain]
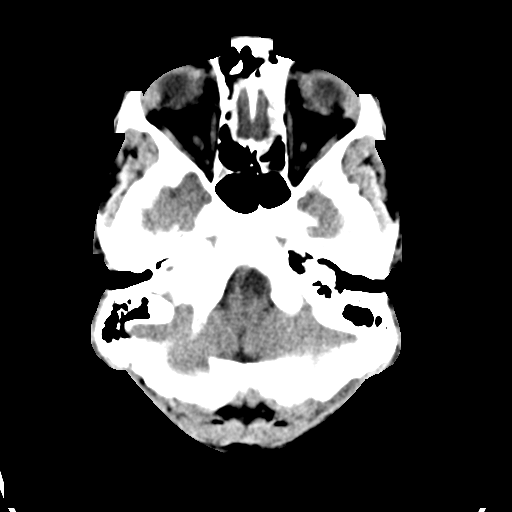
[im 4/31  bone]
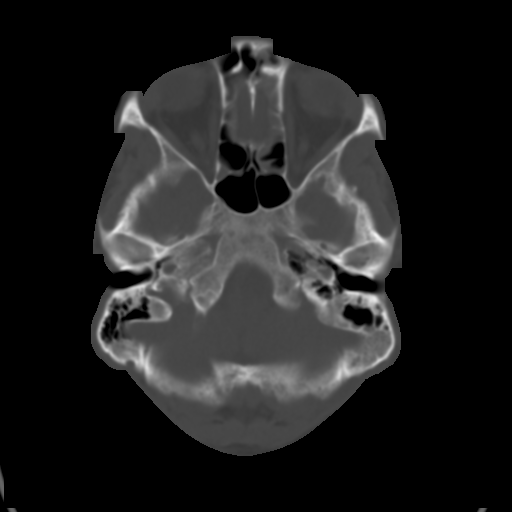
[im 8/31  brain]
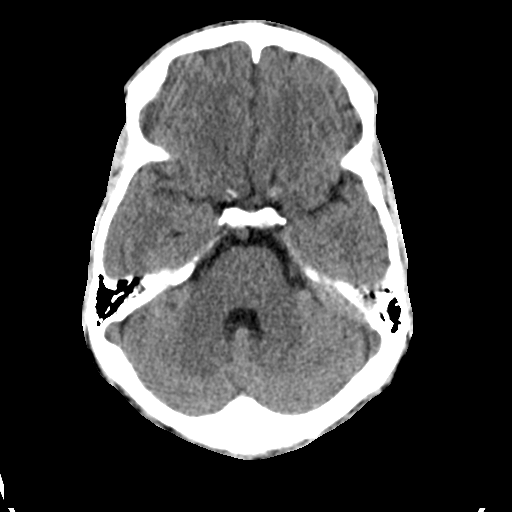
[im 12/31  brain]
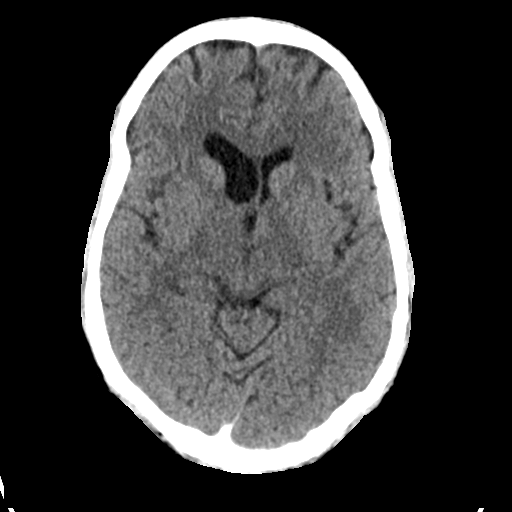
[im 16/31  brain]
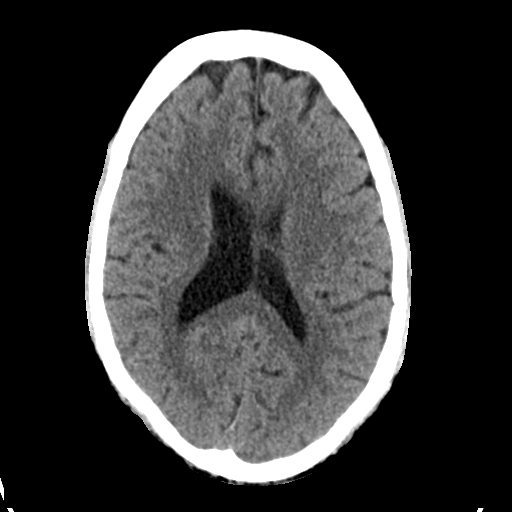
[im 19/31  brain]
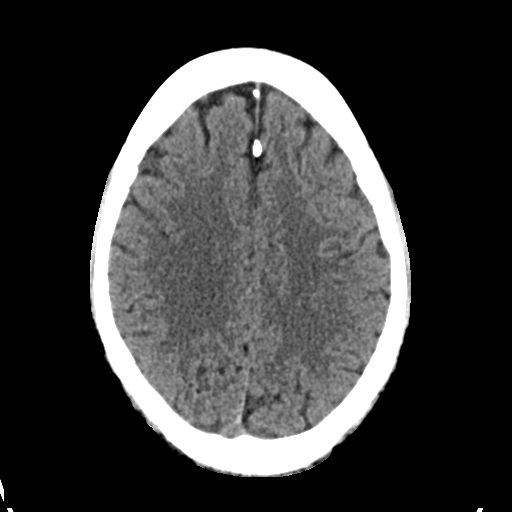
[im 19/31  bone]
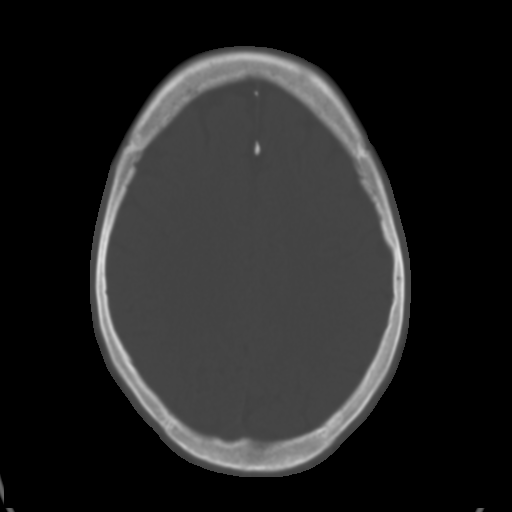
[im 23/31  brain]
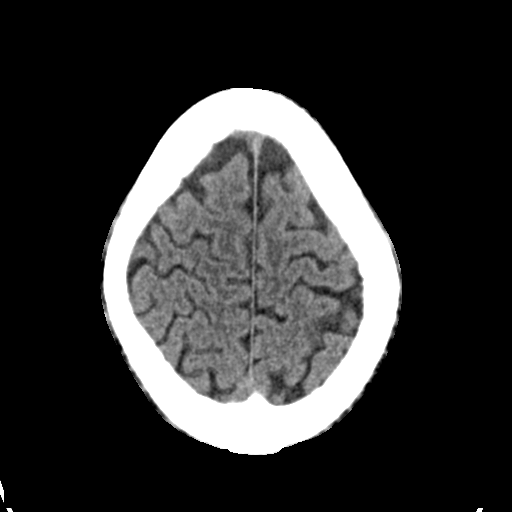
[im 27/31  brain]
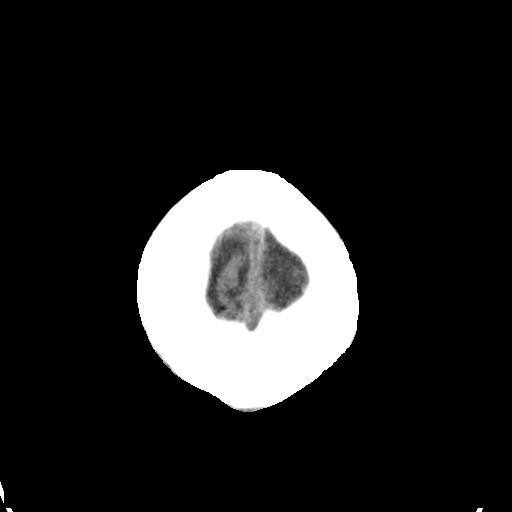

[Series 3: head bone · axial · 0.44mm/px · z∈[-100,-70]mm · 3 of 77 slices shown]
[im 8/77  bone]
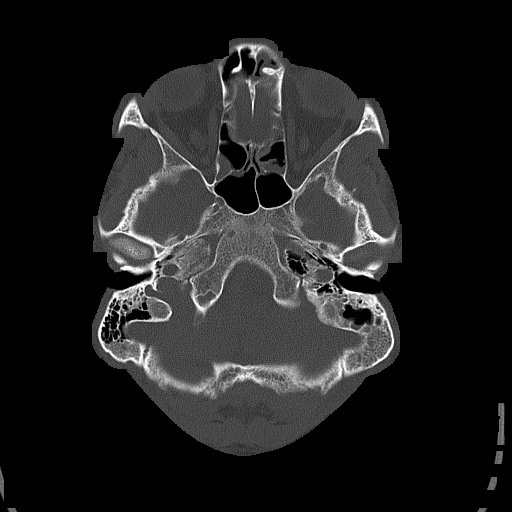
[im 16/77  bone]
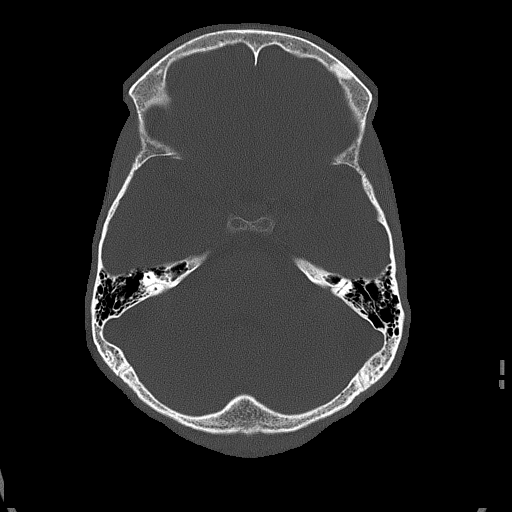
[im 23/77  bone]
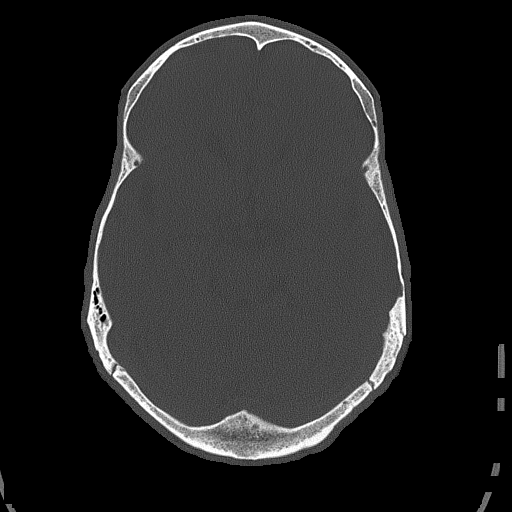

[Series 4: head without cor · coronal · non-contrast · 0.29mm/px · 3 of 72 slices shown]
[im 24/72  brain]
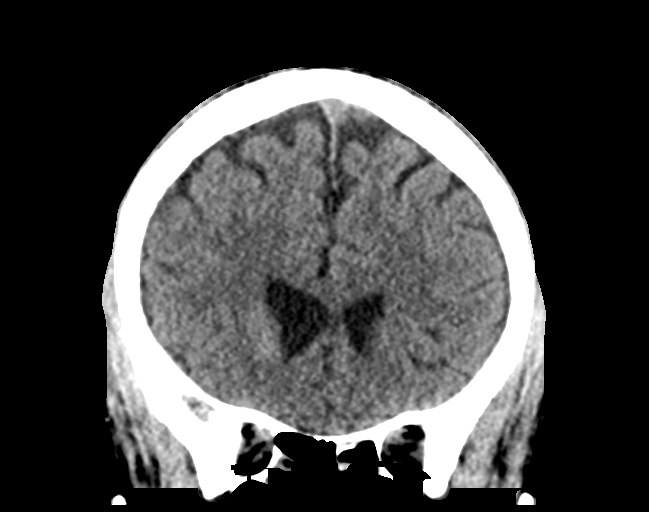
[im 32/72  brain]
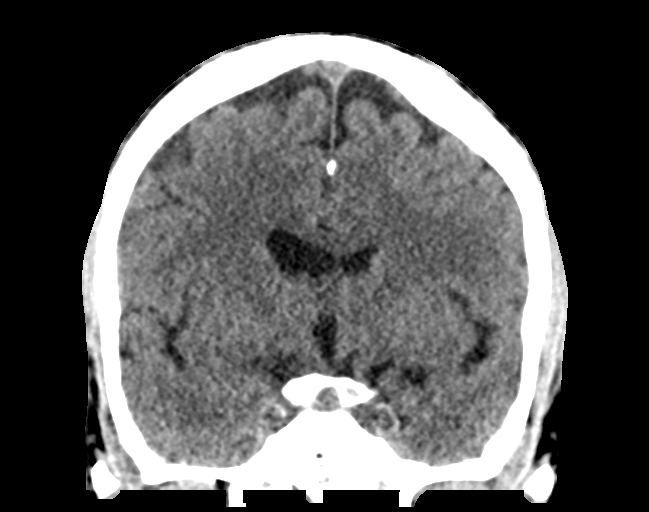
[im 40/72  brain]
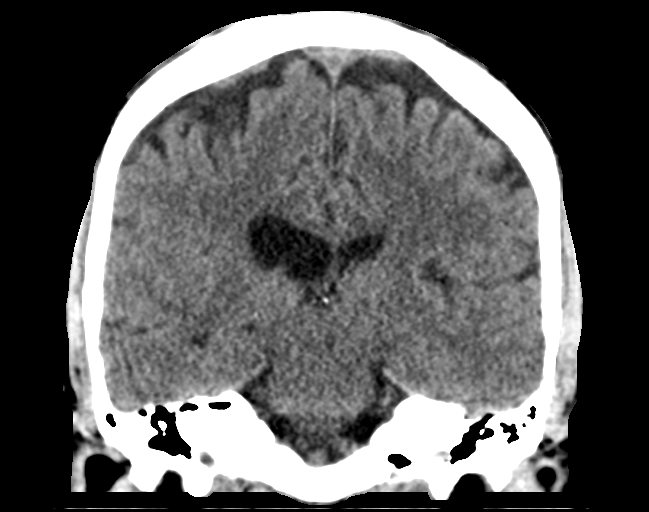

[Series 5: head without sag · sagittal · non-contrast · 0.31mm/px · 3 of 54 slices shown]
[im 18/54  brain]
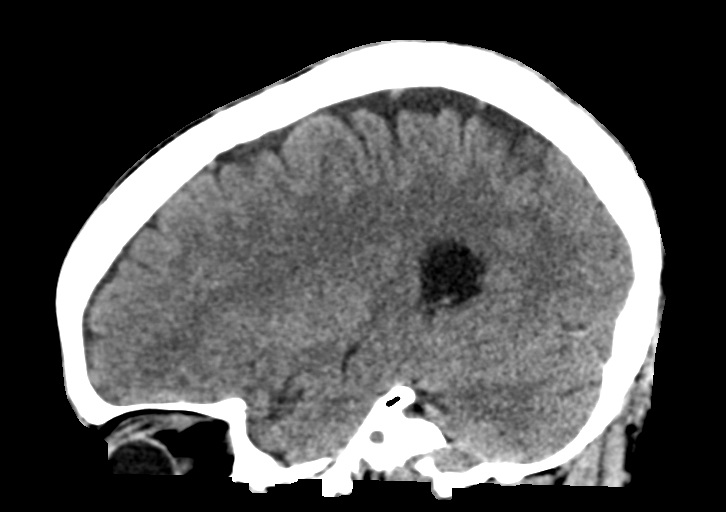
[im 27/54  brain]
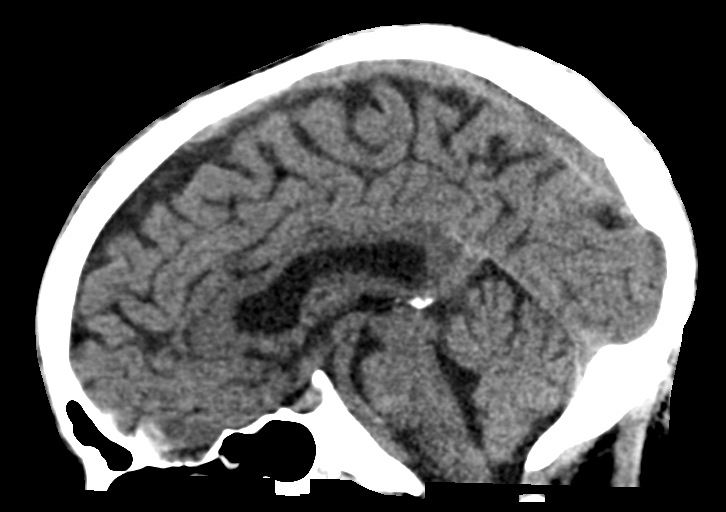
[im 36/54  brain]
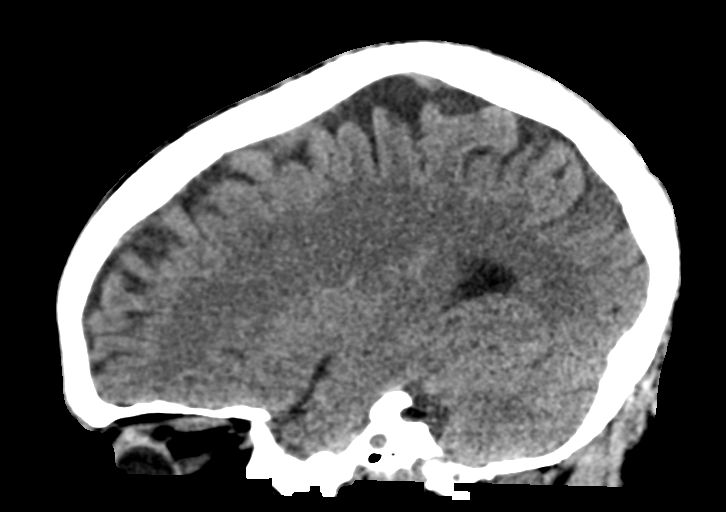

[16 of 47 positions shown; findings below may reference images not displayed]

FINDINGS: Ventricular asymmetry right greater than left. This may be a
congenital variant. No hydrocephalus. Normal cerebral volume

Negative for acute or chronic infarction.

Negative for hemorrhage or fluid collection.

Negative for mass or edema.  No shift of the midline structures.

Normal calvarium.  Mucosal edema in the ethmoid sinuses.
IMPRESSION: Ventricular asymmetry likely a normal variant. Otherwise negative CT
of the brain

Mucosal edema in the ethmoid sinuses.

## 2017-09-06 ENCOUNTER — Emergency Department (HOSPITAL_COMMUNITY): Payer: Medicare Other

## 2017-09-06 ENCOUNTER — Encounter (HOSPITAL_COMMUNITY): Payer: Self-pay | Admitting: Emergency Medicine

## 2017-09-06 ENCOUNTER — Other Ambulatory Visit: Payer: Self-pay

## 2017-09-06 ENCOUNTER — Emergency Department (HOSPITAL_COMMUNITY)
Admission: EM | Admit: 2017-09-06 | Discharge: 2017-09-06 | Discharge status: Against Medical Advice | Payer: Medicare Other | Attending: Emergency Medicine | Admitting: Emergency Medicine

## 2017-09-06 DIAGNOSIS — J189 Pneumonia, unspecified organism: Secondary | ICD-10-CM | POA: Diagnosis not present

## 2017-09-06 DIAGNOSIS — N183 Chronic kidney disease, stage 3 (moderate): Secondary | ICD-10-CM | POA: Insufficient documentation

## 2017-09-06 DIAGNOSIS — D571 Sickle-cell disease without crisis: Secondary | ICD-10-CM | POA: Insufficient documentation

## 2017-09-06 DIAGNOSIS — Z87891 Personal history of nicotine dependence: Secondary | ICD-10-CM | POA: Insufficient documentation

## 2017-09-06 DIAGNOSIS — Z79899 Other long term (current) drug therapy: Secondary | ICD-10-CM | POA: Insufficient documentation

## 2017-09-06 LAB — CBC
HCT: 14.7 % — ABNORMAL LOW (ref 39.0–52.0)
HEMOGLOBIN: 5.4 g/dL — AB (ref 13.0–17.0)
MCH: 35.1 pg — ABNORMAL HIGH (ref 26.0–34.0)
MCHC: 36.7 g/dL — ABNORMAL HIGH (ref 30.0–36.0)
MCV: 95.5 fL (ref 78.0–100.0)
PLATELETS: 331 10*3/uL (ref 150–400)
RBC: 1.54 MIL/uL — AB (ref 4.22–5.81)
RDW: 24 % — ABNORMAL HIGH (ref 11.5–15.5)
WBC: 7.2 10*3/uL (ref 4.0–10.5)

## 2017-09-06 LAB — BASIC METABOLIC PANEL
ANION GAP: 8 (ref 5–15)
BUN: 24 mg/dL — ABNORMAL HIGH (ref 6–20)
CHLORIDE: 107 mmol/L (ref 101–111)
CO2: 20 mmol/L — ABNORMAL LOW (ref 22–32)
CREATININE: 2.02 mg/dL — AB (ref 0.61–1.24)
Calcium: 10.5 mg/dL — ABNORMAL HIGH (ref 8.9–10.3)
GFR calc non Af Amer: 39 mL/min — ABNORMAL LOW (ref >60)
GFR, EST AFRICAN AMERICAN: 45 mL/min — AB (ref >60)
Glucose, Bld: 96 mg/dL (ref 65–99)
POTASSIUM: 3.7 mmol/L (ref 3.5–5.1)
SODIUM: 135 mmol/L (ref 135–145)

## 2017-09-06 LAB — I-STAT TROPONIN, ED: Troponin i, poc: 0 ng/mL (ref 0.00–0.08)

## 2017-09-06 MED ORDER — BENZONATATE 100 MG PO CAPS: 100.0000 mg | ORAL_CAPSULE | Freq: Three times a day (TID) | ORAL | 0 refills | Status: DC

## 2017-09-06 MED ORDER — ALBUTEROL SULFATE (2.5 MG/3ML) 0.083% IN NEBU
5.0000 mg | INHALATION_SOLUTION | Freq: Once | RESPIRATORY_TRACT | Status: AC
  Administered 2017-09-06: 5 mg via RESPIRATORY_TRACT
  Filled 2017-09-06: qty 6

## 2017-09-06 MED ORDER — MORPHINE SULFATE (PF) 4 MG/ML IV SOLN
4.0000 mg | Freq: Once | INTRAVENOUS | Status: AC
  Administered 2017-09-06: 4 mg via INTRAVENOUS
  Filled 2017-09-06: qty 1

## 2017-09-06 MED ORDER — IOPAMIDOL (ISOVUE-370) INJECTION 76%
INTRAVENOUS | Status: AC
  Administered 2017-09-06: 100 mL
  Filled 2017-09-06: qty 100

## 2017-09-06 MED ORDER — DOXYCYCLINE HYCLATE 100 MG PO CAPS: 100.0000 mg | ORAL_CAPSULE | Freq: Two times a day (BID) | ORAL | 0 refills | Status: DC

## 2017-09-06 MED ORDER — BENZONATATE 100 MG PO CAPS
100.0000 mg | ORAL_CAPSULE | Freq: Once | ORAL | Status: AC
  Administered 2017-09-06: 100 mg via ORAL
  Filled 2017-09-06: qty 1

## 2017-09-06 MED ORDER — SODIUM CHLORIDE 0.9 % IV BOLUS (SEPSIS)
1000.0000 mL | Freq: Once | INTRAVENOUS | Status: AC
  Administered 2017-09-06: 1000 mL via INTRAVENOUS

## 2017-09-06 NOTE — ED Notes (Signed)
ED Provider at bedside. 

## 2017-09-06 NOTE — ED Provider Notes (Signed)
[facility] EMERGENCY [facility] Provider Note   CSN: [id number] Arrival date & time: 09/06/17  0139     History   Chief Complaint Chief Complaint  Patient presents with  . Cough    HPI Shawn DollarLuke Fulop is a 41 y.o. male.  The history is provided by the patient and medical records.  Cough     41 y.o. M with hx of heart murmur, sarcoidosis, sickle cell anemia, presenting to the ED for chest pain.  Patient states this has been ongoing for about 3 days but does not feel like his sickle cell pain/crisis.  Patient states he has been having dry cough without fever, chills, sweats.  States he does have a dull ache in the chest with coughing.  No true SOB.  No pain with inspiration/expiration.  Patient does have known hx of sarcoidosis, states he gets bronchitis quite often.  No hx of DVT or PE.  Past Medical History:  Diagnosis Date  . Aneurysm (HCC)    Pt states happened in 2011, Brain   . Heart murmur   . Sarcoid   . Sickle cell anemia [facility](HCC)     Patient Active Problem List   Diagnosis Date Noted  . Headache 05/03/2016  . Anemia 05/03/2016  . Headache disorder   . Vitamin D deficiency 02/09/2016  . Heart murmur 02/09/2016  . Recurrent occipital headache 10/15/2015  . History of aneurysm 10/13/2015  . Left hip pain 07/30/2015  . Left ankle pain 07/08/2015  . Sickle cell crisis (HCC) 03/08/2015  . 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 Sickle cell anemia with crisis (HCC) 12/07/2014  . Sickle cell anemia (HCC) 06/20/2014  . CKD (chronic kidney disease) stage 3, GFR 30-59 ml/min (HCC) 06/20/2014  . Leukocytosis 06/20/2014  . Sickle cell pain crisis (HCC) 06/20/2014  . Hypercalcemia 06/20/2014  . Symptomatic anemia 05/31/2014    Past Surgical History:  Procedure Laterality Date  . LYMPH NODE BIOPSY         Home Medications    Prior to Admission medications   Medication Sig Start Date End Date Taking? Authorizing Provider    acetaminophen (TYLENOL) 500 MG tablet Take 1,000-1,500 mg by mouth every 8 (eight) hours as needed for mild pain, moderate pain, fever or headache.    Yes [provider]  folic acid (FOLVITE) 1 MG tablet Take 1 tablet (1 mg total) by mouth daily. 02/09/16  Yes Quentin AngstJegede, [name], MD  hydroxyurea (HYDREA) 500 MG capsule Take 2 capsules (1,000 mg total) by mouth daily. May take with food to minimize GI side effects. 02/09/16  Yes Quentin AngstJegede, [name], MD  ondansetron (ZOFRAN) 4 MG tablet Take 1 tablet (4 mg total) by mouth every 6 (six) hours. Patient taking differently: Take 4 mg by mouth every 6 (six) hours as needed for nausea or vomiting.  12/21/15  Yes Hedges, Tinnie GensJeffrey, PA-C  Oxycodone HCl 20 MG TABS Take 10 mg by mouth every 4 (four) hours.   Yes [provider]  vitamin B-12 (CYANOCOBALAMIN) 1000 MCG tablet Take 1,000 mcg by mouth daily.   Yes [provider]  Vitamin D, Ergocalciferol, (DRISDOL) 50000 units CAPS capsule Take 50,000 Units by mouth every 7 (seven) days.   Yes [provider]    Family History Family History  Problem Relation Age of Onset  . Diabetes Mother   . Hypertension Father   . Lupus Sister     Social History Social History   Tobacco Use  .  Smoking status: Former Games developer  . Smokeless tobacco: Never Used  Substance Use Topics  . Alcohol use: No    Alcohol/week: 0.6 oz    Types: 1 Cans of beer per week    Comment: occasionally  . Drug use: No    Comment: Pt states uses 2 times a day     Allergies   Patient has no known allergies.   Review of Systems Review of Systems  Respiratory: Positive for cough.   All other systems reviewed and are negative.    Physical Exam Updated Vital Signs BP 131/83 (BP Location: Right Arm)   Pulse 94   Temp 98.1 F (36.7 C) (Oral)   Resp 19   Ht 6' (1.829 m)   Wt 63.5 kg (140 lb)   SpO2 95%   BMI 18.99 kg/m   Physical Exam  Constitutional: He is oriented to person,  place, and time. He appears well-developed and well-nourished.  HENT:  Head: Normocephalic and atraumatic.  Mouth/Throat: Oropharynx is clear and moist.  Eyes: Conjunctivae and EOM are normal. Pupils are equal, round, and reactive to light.  Neck: Normal range of motion.  Cardiovascular: Normal rate, regular rhythm and normal heart sounds.  Pulmonary/Chest: Effort normal and breath sounds normal. No stridor. No respiratory distress.  Abdominal: Soft. Bowel sounds are normal. There is no tenderness. There is no rebound.  Musculoskeletal: Normal range of motion.  Neurological: He is alert and oriented to person, place, and time.  Skin: Skin is warm and dry.  Psychiatric: He has a normal mood and affect.  Nursing note and vitals reviewed.    ED Treatments / Results  Labs (all labs ordered are listed, but only abnormal results are displayed) Labs Reviewed  BASIC METABOLIC PANEL - Abnormal; Notable for the following components:      Result Value   CO2 20 (*)    BUN 24 (*)    Creatinine, Ser 2.02 (*)    Calcium 10.5 (*)    GFR calc non Af Amer 39 (*)    GFR calc Af Amer 45 (*)    All other components within normal limits  CBC - Abnormal; Notable for the following components:   RBC 1.54 (*)    Hemoglobin 5.4 (*)    HCT 14.7 (*)    MCH 35.1 (*)    MCHC 36.7 (*)    RDW 24.0 (*)    All other components within normal limits  I-STAT TROPONIN, ED    EKG  EKG Interpretation  Date/Time:  Wednesday September 06 2017 01:47:52 EST Ventricular Rate:  98 PR Interval:  212 QRS Duration: 94 QT Interval:  340 QTC Calculation: 434 R Axis:   35 Text Interpretation:  Sinus rhythm with 1st degree A-V block Left ventricular hypertrophy Abnormal ECG When compared with ECG of 08/23/2016, No significant change was found Confirmed by Dione Booze ([id number]) on 09/06/2017 4:52:13 AM       Radiology Dg Chest 2 View  Result Date: 09/06/2017 CLINICAL DATA:  Cough for 3 days. EXAM: CHEST  2 VIEW  COMPARISON:  06/28/2017, 03/08/2015. FINDINGS: Stable hilar and mediastinal adenopathy bilaterally, consistent with sarcoidosis. No airspace consolidation. No pleural effusions. Normal pulmonary vasculature. Normal heart size. IMPRESSION: No consolidation or effusion. Stable mediastinal and hilar adenopathy consistent with sarcoidosis. Electronically Signed   By: Ellery Plunk M.D.   On: 09/06/2017 02:45    Procedures Procedures (including critical care time)  Medications Ordered in ED Medications  albuterol (PROVENTIL) (2.5 MG/3ML) 0.083%  nebulizer solution 5 mg (5 mg Nebulization Given 09/06/17 0152)  morphine 4 MG/ML injection 4 mg (4 mg Intravenous Given 09/06/17 0428)  sodium chloride 0.9 % bolus 1,000 mL (1,000 mLs Intravenous New Bag/Given 09/06/17 0433)  benzonatate (TESSALON) capsule 100 mg (100 mg Oral Given 09/06/17 0428)  iopamidol (ISOVUE-370) 76 % injection (100 mLs  Contrast Given 09/06/17 0500)     Initial Impression / Assessment and Plan / ED Course  I have reviewed the triage vital signs and the nursing notes.  Pertinent labs & imaging results that were available during my care of the patient were reviewed by me and considered in my medical decision making (see chart for details).  41 year old male here with cough and chest pain.  Has history of sickle cell anemia but reports this feels different than crises.  He denies any fever or chills.  No frank shortness of breath.  Screening labs as above.  Hemoglobin is 5.4 which patient reports this is his baseline.  Trop negative.  CXR with findings of sarcoidosis.  Given his history and clinical symptoms, concern for possible occult pneumonia not visualized on chest x-ray.  She will plan for CTA to assess for this as well as to rule out PE.  Patient was given some IV fluids as well as pain medications.  6:17 AM Patient's CTA with patchy airspace opacities at the lower lung lobes concerning for pneumonia.  No evidence of PE.   Patient reassessed, states he overall feels better.  Given his chest pain and history of sickle cell with findings of pneumonia on CTA, I have recommended that he be admitted for acute chest syndrome.  Patient declining admission.  I discussed with him potentially worsening of his condition including respiratory failure, rapid decompensation/decline, impending vascular collapse, or even death--patient acknowledged these risks, but still wishes to leave.  Patient is of sound mind and capable of making his own medical decisions at this time.  He will sign out AGAINST MEDICAL ADVICE.  I have prescribed him antibiotics to take at home.  He was given strict return precautions for any new/worsening symptoms.  Final Clinical Impressions(s) / ED Diagnoses   Final diagnoses:  Community acquired pneumonia, unspecified laterality    ED Discharge Orders        Ordered    doxycycline (VIBRAMYCIN) 100 MG capsule  2 times daily     09/06/17 0621    benzonatate (TESSALON) 100 MG capsule  Every 8 hours     09/06/17 0621       Garlon HatchetSanders, Har[place] Thresher M, PA-C 09/06/17 [phone]    Dione BoozeGlick, David, MD 09/06/17 [phone]

## 2017-09-06 NOTE — Discharge Instructions (Signed)
As we discussed, you have findings of pneumonia on your CT of your chest.  We have recommended admission, however you have declined and decided to go home.  You are signing out AGAINST MEDICAL ADVICE.  You have acknowledged understanding that your clinical course may significantly worsen. I recommend that you take the antibiotics and return here immediately for any new or worsening symptoms.

## 2017-09-06 NOTE — ED Notes (Signed)
Patient transported to CT 

## 2017-09-06 NOTE — ED Notes (Signed)
Patient states that he has sickle cell and chronic anemia.  States hemoglobin is typically in the 6s and 7s on a good day. A&Ox4 with only complains of chest pain when coughing.

## 2017-09-06 NOTE — ED Notes (Signed)
Date and time results received: 09/06/17 2:31 AM  (use smartphrase ".now" to insert current time)  Test: Hemoglobin Critical Value: 5.4  Name of Provider Notified: Dr. Preston FleetingGlick and primary nurse Arlys JohnBrian   Orders Received? Or Actions Taken?: no new orders at the time

## 2017-09-06 NOTE — ED Triage Notes (Signed)
Reports cough with nasal drainage, chest congestion for several days. Also endorses chest pain and SOB.  Taking otc medication with no relief of symptoms.

## 2017-09-13 ENCOUNTER — Encounter: Payer: Self-pay | Admitting: Family Medicine

## 2017-09-13 ENCOUNTER — Ambulatory Visit (INDEPENDENT_AMBULATORY_CARE_PROVIDER_SITE_OTHER): Payer: Self-pay | Admitting: Family Medicine

## 2017-09-13 ENCOUNTER — Telehealth: Payer: Self-pay | Admitting: Family Medicine

## 2017-09-13 VITALS — BP 124/66 | HR 91 | Temp 98.2°F | Resp 14 | Ht 72.0 in | Wt 130.6 lb

## 2017-09-13 DIAGNOSIS — D571 Sickle-cell disease without crisis: Secondary | ICD-10-CM

## 2017-09-13 DIAGNOSIS — D649 Anemia, unspecified: Secondary | ICD-10-CM

## 2017-09-13 DIAGNOSIS — J189 Pneumonia, unspecified organism: Secondary | ICD-10-CM | POA: Diagnosis not present

## 2017-09-13 LAB — CBC WITH DIFFERENTIAL/PLATELET
BASOS: 1 %
Basophils Absolute: 0.1 10*3/uL (ref 0.0–0.2)
EOS (ABSOLUTE): 0.3 10*3/uL (ref 0.0–0.4)
EOS: 4 %
HEMATOCRIT: 15.9 % — AB (ref 37.5–51.0)
HEMOGLOBIN: 5.7 g/dL — AB (ref 13.0–17.7)
Immature Grans (Abs): 0.1 10*3/uL (ref 0.0–0.1)
Immature Granulocytes: 1 %
LYMPHS ABS: 2.7 10*3/uL (ref 0.7–3.1)
Lymphs: 39 %
MCH: 35.6 pg — AB (ref 26.6–33.0)
MCHC: 35.8 g/dL — AB (ref 31.5–35.7)
MCV: 99 fL — AB (ref 79–97)
MONOS ABS: 0.6 10*3/uL (ref 0.1–0.9)
Monocytes: 9 %
NEUTROS ABS: 3.1 10*3/uL (ref 1.4–7.0)
Neutrophils: 46 %
PLATELETS: 408 10*3/uL — AB (ref 150–379)
RBC: 1.6 x10E6/uL — CL (ref 4.14–5.80)
WBC: 6.8 10*3/uL (ref 3.4–10.8)

## 2017-09-13 LAB — COMPREHENSIVE METABOLIC PANEL
A/G RATIO: 1.3 (ref 1.2–2.2)
ALBUMIN: 4.1 g/dL (ref 3.5–5.5)
ALK PHOS: 151 IU/L — AB (ref 39–117)
ALT: 23 IU/L (ref 0–44)
AST: 52 IU/L — ABNORMAL HIGH (ref 0–40)
BILIRUBIN TOTAL: 3.4 mg/dL — AB (ref 0.0–1.2)
BUN / CREAT RATIO: 10 (ref 9–20)
BUN: 18 mg/dL (ref 6–24)
CHLORIDE: 105 mmol/L (ref 96–106)
CO2: 19 mmol/L — ABNORMAL LOW (ref 20–29)
Calcium: 10.7 mg/dL — ABNORMAL HIGH (ref 8.7–10.2)
Creatinine, Ser: 1.87 mg/dL — ABNORMAL HIGH (ref 0.76–1.27)
GFR calc non Af Amer: 44 mL/min/{1.73_m2} — ABNORMAL LOW (ref >59)
GFR, EST AFRICAN AMERICAN: 50 mL/min/{1.73_m2} — AB (ref >59)
GLOBULIN, TOTAL: 3.1 g/dL (ref 1.5–4.5)
GLUCOSE: 93 mg/dL (ref 65–99)
Potassium: 4.5 mmol/L (ref 3.5–5.2)
SODIUM: 137 mmol/L (ref 134–144)
TOTAL PROTEIN: 7.2 g/dL (ref 6.0–8.5)

## 2017-09-13 LAB — POCT URINALYSIS DIP (DEVICE)
Bilirubin Urine: NEGATIVE
Glucose, UA: NEGATIVE mg/dL
KETONES UR: NEGATIVE mg/dL
Leukocytes, UA: NEGATIVE
Nitrite: NEGATIVE
PH: 6.5 (ref 5.0–8.0)
PROTEIN: 30 mg/dL — AB
Specific Gravity, Urine: 1.015 (ref 1.005–1.030)
UROBILINOGEN UA: 2 mg/dL — AB (ref 0.0–1.0)

## 2017-09-13 LAB — RETICULOCYTES: Retic Ct Pct: 16.5 % — ABNORMAL HIGH (ref 0.6–2.6)

## 2017-09-13 MED ORDER — AZITHROMYCIN 250 MG PO TABS: ORAL_TABLET | ORAL | 0 refills | Status: DC

## 2017-09-13 MED ORDER — ALBUTEROL SULFATE HFA 108 (90 BASE) MCG/ACT IN AERS: 2.0000 | INHALATION_SPRAY | RESPIRATORY_TRACT | 1 refills | Status: DC | PRN

## 2017-09-13 MED ORDER — PREDNISONE 20 MG PO TABS: 40.0000 mg | ORAL_TABLET | Freq: Every day | ORAL | 0 refills | Status: DC

## 2017-09-13 NOTE — Progress Notes (Signed)
Patient ID: Mariam DollarLuke Gingras, male    DOB: [birth year], 41 y.o.   MRN: [id number]  PCP: Massie MaroonHollis, [name], FNP  Chief Complaint  Patient presents with  . Follow-up    6 Month/  . Pneumonia    Subjective:  HPI-Patient is new to me today, although established with practice.  Mariam DollarLuke Sherley is a 41 y.o. male with sickle anemia, presents for evaluation of pneumonia diagnosed 1 week prior. History HB-SS anemia disease, asthma, CKD 3, and sarcoidosis.  [name] [facility] with complaint of shortness of breath, coughing, chest tightness.  Further workup included an abnormal CT angiogram impression was as follows: 1. No evidence of pulmonary embolus. 2. Patchy airspace opacities at the lower lung lobes may reflect mild pneumonia. 3. Underlying scattered nodules within both lungs are similar inappearance to 2017 and likely reflect the patient's pulmonary sarcoidosis. The largest of these measures 6 mm in size. 4. Diffusely enlarged mediastinal and bilateral hilar nodes, measuring up to 4.2 cm in short axis, reflecting the patient's sarcoidosis, similar in appearance to 2017. 5. Mild cardiomegaly. Admission for observation was recommended however patient signed out AMA.  He was discharged on 10 days of doxycycline however reports today that he did not get medication filled and that he has been taking someone else's doxycycline.  He confirms that the medication is doxycycline and is the same dose as what he was prescribed in the same quantity.  Patient reports continued coughing up phlegm, some shortness of breath and wheezing which is worsened by lying flat.  Unknown to him if he has had fever.  He also did not pick up the [employer] Perles as he reports the medication was too expensive and he has not taken anything else any over-the-counter antitussives medication.  Unrelated to visit patient interjects that he is having some erectile dysfunction and wishes to be referred to a specialist.  I do  not see further workup for this problem however patient notes that he has an extensive history of priapism.  He reports  ED symptoms have been occurring greater than 6 months and request a referral to urology for further evaluation and management. Social History   Socioeconomic History  . Marital status: Single    Spouse name: Not on file  . Number of children: Not on file  . Years of education: Not on file  . Highest education level: Not on file  Social Needs  . Financial resource strain: Not on file  . Food insecurity - worry: Not on file  . Food insecurity - inability: Not on file  . Transportation needs - medical: Not on file  . Transportation needs - non-medical: Not on file  Occupational History  . Not on file  Tobacco Use  . Smoking status: Former Games developermoker  . Smokeless tobacco: Never Used  Substance and Sexual Activity  . Alcohol use: No    Alcohol/week: 0.6 oz    Types: 1 Cans of beer per week    Comment: occasionally  . Drug use: No    Comment: Pt states uses 2 times a day  . Sexual activity: Yes    Birth control/protection: None  Other Topics Concern  . Not on file  Social History Narrative  . Not on file    Family History  Problem Relation Age of Onset  . Diabetes Mother   . Hypertension Father   . Lupus Sister    Review of Systems  Constitutional: Positive for fatigue. Negative  for appetite change.  HENT: Negative.   Respiratory: Positive for cough, chest tightness, shortness of breath and wheezing.   Cardiovascular: Negative.   Genitourinary:       Inability to obtain and maintain an erection.  Musculoskeletal: Negative.  Negative for arthralgias, gait problem, joint swelling and myalgias.  Neurological: Negative.   Psychiatric/Behavioral: Negative.    Patient Active Problem List   Diagnosis Date Noted  . Headache 05/03/2016  . Anemia 05/03/2016  . Headache disorder   . Vitamin D deficiency 02/09/2016  . Heart murmur 02/09/2016  . Recurrent  occipital headache 10/15/2015  . History of aneurysm 10/13/2015  . Left hip pain 07/30/2015  . Left ankle pain 07/08/2015  . Sickle cell crisis (HCC) 03/08/2015  . 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 Sickle cell anemia with crisis (HCC) 12/07/2014  . Sickle cell anemia (HCC) 06/20/2014  . CKD (chronic kidney disease) stage 3, GFR 30-59 ml/min (HCC) 06/20/2014  . Leukocytosis 06/20/2014  . Sickle cell pain crisis (HCC) 06/20/2014  . Hypercalcemia 06/20/2014  . Symptomatic anemia 05/31/2014    No Known Allergies  Prior to Admission medications   Medication Sig Start Date End Date Taking? Authorizing Provider  acetaminophen (TYLENOL) 500 MG tablet Take 1,000-1,500 mg by mouth every 8 (eight) hours as needed for mild pain, moderate pain, fever or headache.    Yes [provider]  doxycycline (VIBRAMYCIN) 100 MG capsule Take 1 capsule (100 mg total) by mouth 2 (two) times daily. 09/06/17  Yes Garlon Hatchet, PA-C  famotidine (PEPCID) 20 MG tablet Take 20 mg by mouth daily. 01/31/17  Yes [provider]  folic acid (FOLVITE) 1 MG tablet Take 1 tablet (1 mg total) by mouth daily. 02/09/16  Yes Quentin Angst, MD  hydroxyurea (HYDREA) 500 MG capsule Take 2 capsules (1,000 mg total) by mouth daily. May take with food to minimize GI side effects. 02/09/16  Yes Quentin Angst, MD  ondansetron (ZOFRAN) 4 MG tablet Take 1 tablet (4 mg total) by mouth every 6 (six) hours. Patient taking differently: Take 4 mg by mouth every 6 (six) hours as needed for nausea or vomiting.  12/21/15  Yes Hedges, Tinnie Gens, PA-C  Oxycodone HCl 20 MG TABS Take 10 mg by mouth every 4 (four) hours.   Yes [provider]  vitamin B-12 (CYANOCOBALAMIN) 1000 MCG tablet Take 1,000 mcg by mouth daily.   Yes [provider]  Vitamin D, Ergocalciferol, (DRISDOL) 50000 units CAPS capsule Take 50,000 Units by mouth every 7 (seven) days.   Yes [provider]   benzonatate (TESSALON) 100 MG capsule Take 1 capsule (100 mg total) by mouth every 8 (eight) hours. Patient not taking: Reported on 09/13/2017 09/06/17   Garlon Hatchet, PA-C  Past Medical, Surgical Family and Social History reviewed and updated.   Objective:   Today's Vitals   09/13/17 1004  BP: 124/66  Pulse: 91  Resp: 14  Temp: 98.2 F (36.8 C)  TempSrc: Oral  SpO2: 96%  Weight: 130 lb 9.6 oz (59.2 kg)  Height: 6' (1.829 m)    Wt Readings from Last 3 Encounters:  09/13/17 130 lb 9.6 oz (59.2 kg)  09/06/17 140 lb (63.5 kg)  06/27/17 140 lb (63.5 kg)    Physical Exam  Constitutional: He is oriented to person, place, and time. He appears well-developed and well-nourished.  Cardiovascular: Normal rate, regular rhythm, normal heart sounds and intact distal pulses.  Pulmonary/Chest: Effort normal and breath sounds  normal. No respiratory distress. He has no wheezes. He has no rales. He exhibits no tenderness.  Diminished breath sounds throughout all lung fields.  Neurological: He is alert and oriented to person, place, and time.  Skin: Skin is warm and dry.  Psychiatric: He has a normal mood and affect. His behavior is normal. Thought content normal. He expresses inappropriate judgment.  Patient inappropriately rationalizes taken someone else's prescribed medication.   Assessment & Plan:  1. Pneumonia due to infectious organism, unspecified laterality, unspecified part of lung, on-going patient appears nondistressed, he is able to hold a conversation without shortness of breath, no audible wheezes noted on exam, he is only coughed a couple times during visit today.  However of note he did disclose that he was taking someone else's antibiotic medication although he did not bring the bottle with him to visit today.  I am unable to determine how much medication he is taking and/or if he is even taking the correct medication.  Patient does not understand the implication of taking  someone else's medication as there is no guarantee of what actual medication is in the pill bottle.  I am placing patient on azithromycin as it has better coverage for community-acquired pneumonia and he complains of symptoms which are worsened at night.  We will also prescribe him some prednisone as his lung sounds were diminished however he does have sarcoidosis.  He reports that he discontinued using an albuterol inhaler as it did not work however I am encouraging him to resume use. For cough, recommended OTC Delsym.   2. Hb-SS disease without crisis [facility]), chronic on-going  -Managed and followed by hematology, Onalee Hua lady at [facility].  Currently stable , not in active pain crisis.   3. Low hemoglobin and low hematocrit, chronic, hemoglobin at the ED on 09/06/2017 was 5.4.  Asymptomatic at present. Due to chronic iron overload secondary to multiple RBC infusions, patient is not transfused unless hemoglobin drops below 5.  Will obtain a stat CBC, reticulocyte and CMP.  If hemoglobin is 5 or less I will have him return for blood transfusion.   4. Erectile Dysfunction, new problem -referring patient to urology for further evaluation and treatment options for ED within the [facility].  Patient receives all of his specialty care from [facility] and therefore I will keep him within that health system.  Meds ordered this encounter  Medications  . azithromycin (ZITHROMAX) 250 MG tablet    Sig: Take 2 tabs PO x 1 dose, then 1 tab PO QD x 4 days    Dispense:  6 tablet    Refill:  0    Order Specific Question:   Supervising Provider    Answer:   Quentin Angst [id number]  . predniSONE (DELTASONE) 20 MG tablet    Sig: Take 2 tablets (40 mg total) by mouth daily with breakfast.    Dispense:  10 tablet    Refill:  0    Order Specific Question:   Supervising Provider    Answer:   Quentin Angst [id number]  . albuterol (PROVENTIL  HFA;VENTOLIN HFA) 108 (90 Base) MCG/ACT inhaler    Sig: Inhale 2 puffs into the lungs every 4 (four) hours as needed for wheezing or shortness of breath (cough, shortness of breath or wheezing.).    Dispense:  1 Inhaler    Refill:  1    Order Specific Question:   Supervising Provider  AnswerQuentin Angst:   [name], [name] [[id number]]    Orders Placed This Encounter  Procedures  . CBC with Differential  . Reticulocytes  . Comprehensive metabolic panel  . POCT urinalysis dip (device)    -Repeat chest x-ray will be needed in 6 weeks.  Return in 7 days for further evaluation of symptoms associated with current active pneumonia.  He will follow-up with Julianne HandlerLaChina Hollis, FNP-C.   Godfrey PickKimberly S. Tiburcio PeaHarris, MSN, FNP-C [facility]  [address] [place], [place] [address] [phone]

## 2017-09-13 NOTE — Patient Instructions (Addendum)
Adding  Azithromycin Take 2 tabs x 1 dose, then 1 tab every day for x 4 days. Complete doxycyline as prescribed.   For cough, I recommend over the counter Delsym twice daily.  Prednisone 40 mg once daily with breakfast..  I have also added albuterol inhaler 2 puffs every 4-6 hours as needed for wheezing and or shortness of breath.     Sickle Cell Anemia, Adult Sickle cell anemia is a condition where your red blood cells are shaped like sickles. Red blood cells carry oxygen through the body. Sickle-shaped red blood cells do not live as long as normal red blood cells. They also clump together and block blood from flowing through the blood vessels. These things prevent the body from getting enough oxygen. Sickle cell anemia causes organ damage and pain. It also increases the risk of infection. Follow these instructions at home:  Drink enough fluid to keep your pee (urine) clear or pale yellow. Drink more in hot weather and during exercise.  Do not smoke. Smoking lowers oxygen levels in the blood.  Only take over-the-counter or prescription medicines as told by your doctor.  Take antibiotic medicines as told by your doctor. Make sure you finish them even if you start to feel better.  Take supplements as told by your doctor.  Consider wearing a medical alert bracelet. This tells anyone caring for you in an emergency of your condition.  When traveling, keep your medical information, doctors' names, and the medicines you take with you at all times.  If you have a fever, do not take fever medicines right away. This could cover up a problem. Tell your doctor.  Keep all follow-up visits with your doctor. Sickle cell anemia requires regular medical care. Contact a doctor if: You have a fever. Get help right away if:  You feel dizzy or faint.  You have new belly (abdominal) pain, especially on the left side near the stomach area.  You have a lasting, often uncomfortable and painful  erection of the penis (priapism). If it is not treated right away, you will become unable to have sex (impotence).  You have numbness in your arms or legs or you have a hard time moving them.  You have a hard time talking.  You have a fever or lasting symptoms for more than 2-3 days.  You have a fever and your symptoms suddenly get worse.  You have signs or symptoms of infection. These include: ? Chills. ? Being more tired than normal (lethargy). ? Irritability. ? Poor eating. ? Throwing up (vomiting).  You have pain that is not helped with medicine.  You have shortness of breath.  You have pain in your chest.  You are coughing up pus-like or bloody mucus.  You have a stiff neck.  Your feet or hands swell or have pain.  Your belly looks bloated.  Your joints hurt. This information is not intended to replace advice given to you by your health care provider. Make sure you discuss any questions you have with your health care provider. Document Released: 07/03/2013 Document Revised: 02/18/2016 Document Reviewed: 04/24/2013 Elsevier Interactive Patient Education  2017 Elsevier Inc.      Community-Acquired Pneumonia, Adult Pneumonia is an infection of the lungs. One type of pneumonia can happen while a person is in a hospital. A different type can happen when a person is not in a hospital (community-acquired pneumonia). It is easy for this kind to spread from person to person. It can spread to  you if you breathe near an infected person who coughs or sneezes. Some symptoms include:  A dry cough.  A wet (productive) cough.  Fever.  Sweating.  Chest pain.  Follow these instructions at home:  Take over-the-counter and prescription medicines only as told by your doctor. ? Only take cough medicine if you are losing sleep. ? If you were prescribed an antibiotic medicine, take it as told by your doctor. Do not stop taking the antibiotic even if you start to feel  better.  Sleep with your head and neck raised (elevated). You can do this by putting a few pillows under your head, or you can sleep in a recliner.  Do not use tobacco products. These include cigarettes, chewing tobacco, and e-cigarettes. If you need help quitting, ask your doctor.  Drink enough water to keep your pee (urine) clear or pale yellow. A shot (vaccine) can help prevent pneumonia. Shots are often suggested for:  People older than 41 years of age.  People older than 41 years of age: ? Who are having cancer treatment. ? Who have long-term (chronic) lung disease. ? Who have problems with their body's defense system (immune system).  You may also prevent pneumonia if you take these actions:  Get the flu (influenza) shot every year.  Go to the dentist as often as told.  Wash your hands often. If soap and water are not available, use hand sanitizer.  Contact a doctor if:  You have a fever.  You lose sleep because your cough medicine does not help. Get help right away if:  You are short of breath and it gets worse.  You have more chest pain.  Your sickness gets worse. This is very serious if: ? You are an older adult. ? Your body's defense system is weak.  You cough up blood. This information is not intended to replace advice given to you by your health care provider. Make sure you discuss any questions you have with your health care provider. Document Released: 02/29/2008 Document Revised: 02/18/2016 Document Reviewed: 01/07/2015 Elsevier Interactive Patient Education  [employer]2018 [employer].

## 2017-09-13 NOTE — Telephone Encounter (Signed)
Contact patient to advise his hemoglobin is stable at 5.7, he does not have an elevated white count. He should follow-up ount however he should complete all of the doxycycline and all of azithromycin which has been prescribed to him.  Also he should keep his 1 week follow-up with Armeniahina to reevaluate his hemoglobin.  Reinforced again if any symptoms worsen or do not improve he is to go immediately to the emergency department for further evaluation.

## 2017-09-14 NOTE — Telephone Encounter (Signed)
Patient notified and agrees with plan

## 2017-09-14 NOTE — Telephone Encounter (Signed)
Left a vm for patient to callback 

## 2017-09-20 ENCOUNTER — Ambulatory Visit: Payer: Medicare Other | Admitting: Family Medicine

## 2017-09-21 ENCOUNTER — Ambulatory Visit (INDEPENDENT_AMBULATORY_CARE_PROVIDER_SITE_OTHER): Payer: Self-pay | Admitting: Family Medicine

## 2017-09-21 ENCOUNTER — Encounter: Payer: Self-pay | Admitting: Family Medicine

## 2017-09-21 VITALS — BP 121/63 | HR 84 | Temp 98.3°F | Resp 16 | Ht 72.0 in | Wt 132.0 lb

## 2017-09-21 DIAGNOSIS — N183 Chronic kidney disease, stage 3 unspecified: Secondary | ICD-10-CM

## 2017-09-21 DIAGNOSIS — D5700 Hb-SS disease with crisis, unspecified: Secondary | ICD-10-CM | POA: Diagnosis not present

## 2017-09-21 DIAGNOSIS — N529 Male erectile dysfunction, unspecified: Secondary | ICD-10-CM

## 2017-09-21 DIAGNOSIS — J189 Pneumonia, unspecified organism: Secondary | ICD-10-CM

## 2017-09-21 MED ORDER — AZITHROMYCIN 250 MG PO TABS
ORAL_TABLET | ORAL | 0 refills | Status: DC
Start: 2017-09-21 — End: 2017-10-05

## 2017-09-21 MED ORDER — ALBUTEROL SULFATE HFA 108 (90 BASE) MCG/ACT IN AERS: 2.0000 | INHALATION_SPRAY | RESPIRATORY_TRACT | 1 refills | Status: DC | PRN

## 2017-09-21 MED FILL — AZITHROMYCIN 250 MG TABLET: 250 | 5 days supply | Qty: 6 | Fill #0

## 2017-09-21 MED FILL — !VENTOLIN HFA INHALER: 108 (90 BAS | 16 days supply | Qty: 18 | Fill #0

## 2017-09-21 NOTE — Progress Notes (Signed)
Chief Complaint  Patient presents with  . Follow-up    pneumonia     Subjective:    Patient ID: Shawn Adams, male    DOB: September 01, 1976, 41 y.o.   MRN: [id number]  HPI Shawn Adams, a 41 year old male with a history of sickle cell anemia,HbSS, sarcoidosis, and stage III chronic kidney disease presents accompanied by girlfriend for evaluation of pneumonia that was diagnosed several weeks ago.  Shawn Adams was initially evaluated in the emergency department on 09/13/2017 for shortness of breath, coughing and chest tightness.  Patient underwent a CT angiogram that was consistent with lower lung lobe pneumonia.  Patient was prescribed Azithromycin 1 week ago and was unable to get medications due to cost constraints..  He states that he received medications from a friend and was unable to complete full course of antibiotic therapy patient was evaluated in office 1 week ago and was prescribed a steroid taper.  Patient completed steroid taper and continues to experience dyspnea.  The impression of the CT angiogram is as follows: IMPRESSION: 1. No evidence of pulmonary embolus. 2. Patchy airspace opacities at the lower lung lobes may reflect mild pneumonia. 3. Underlying scattered nodules within both lungs are similar in appearance to 2017 and likely reflect the patient's pulmonary sarcoidosis. The largest of these measures 6 mm in size. 4. Diffusely enlarged mediastinal and bilateral hilar nodes, measuring up to 4.2 cm in short axis, reflecting the patient's sarcoidosis, similar in appearance to 2017. 5. Mild cardiomegaly. Patient states that he has been taking OTC Guaifenisin without relief. He endorses persistent cough and fatigue.   Patient is also complaining of erectile dysfunction, which started several months ago. Patient reports that he has been having difficulty achieving or maintaining an erection.  Libido is not affected.   Past Medical History:  Diagnosis Date  . Aneurysm (HCC)    Pt  states happened in 2011, Brain   . Heart murmur   . Sarcoid   . Sickle cell anemia (HCC)    Social History   Socioeconomic History  . Marital status: Single    Spouse name: Not on file  . Number of children: Not on file  . Years of education: Not on file  . Highest education level: Not on file  Social Needs  . Financial resource strain: Not on file  . Food insecurity - worry: Not on file  . Food insecurity - inability: Not on file  . Transportation needs - medical: Not on file  . Transportation needs - non-medical: Not on file  Occupational History  . Not on file  Tobacco Use  . Smoking status: Former Games developermoker  . Smokeless tobacco: Never Used  Substance and Sexual Activity  . Alcohol use: No    Alcohol/week: 0.6 oz    Types: 1 Cans of beer per week    Comment: occasionally  . Drug use: No    Comment: Pt states uses 2 times a day  . Sexual activity: Yes    Birth control/protection: None  Other Topics Concern  . Not on file  Social History Narrative  . Not on file   Immunization History  Administered Date(s) Administered  . Influenza,inj,Quad PF,6+ Mos 06/21/2014, 07/08/2015  . Influenza,inj,quad, With Preservative 05/11/2016  . Tdap 07/08/2015    Review of Systems  Constitutional: Negative.   HENT: Negative.   Eyes: Negative.   Respiratory: Positive for cough and shortness of breath. Negative for wheezing.   Cardiovascular: Negative.  Negative for chest pain and leg swelling.  Endocrine: Negative.   Genitourinary: Negative.   Musculoskeletal: Negative.   Skin: Negative.  Negative for pallor and rash.  Allergic/Immunologic: Negative.   Neurological: Negative.   Hematological: Negative.   Psychiatric/Behavioral: Negative.        Objective:   Physical Exam  Constitutional: He is oriented to person, place, and time.  Eyes: Pupils are equal, round, and reactive to light. Scleral icterus is present.  Neck: Normal range of motion. Neck supple.  Cardiovascular:  Normal rate, regular rhythm, normal heart sounds and intact distal pulses.  Pulmonary/Chest: Effort normal. No respiratory distress. He has no wheezes. He has rhonchi in the right lower field and the left lower field. He exhibits no tenderness.  Abdominal: Soft. Bowel sounds are normal.  Musculoskeletal: Normal range of motion.  Neurological: He is alert and oriented to person, place, and time.  Skin: Skin is warm and dry.  Psychiatric: He has a normal mood and affect. His behavior is normal. Judgment and thought content normal.      BP 121/63 (BP Location: Right Arm, Patient Position: Sitting, Cuff Size: Normal)   Pulse 84   Temp 98.3 F (36.8 C) (Oral)   Resp 16   Ht 6' (1.829 m)   Wt 132 lb (59.9 kg)   SpO2 98%   BMI 17.90 kg/m  Assessment & Plan:  1. Pneumonia due to infectious organism, unspecified laterality, unspecified part of lung Patient was diagnosed with - albuterol (PROVENTIL HFA;VENTOLIN HFA) 108 (90 Base) MCG/ACT inhaler; Inhale 2 puffs into the lungs every 4 (four) hours as needed for wheezing or shortness of breath (cough, shortness of breath or wheezing.).  Dispense: 1 Inhaler; Refill: 1 - azithromycin (ZITHROMAX) 250 MG tablet; Take 2 tabs PO x 1 dose, then 1 tab PO QD x 4 days  Dispense: 6 tablet; Refill: 0 -Chest xray 2. CKD (chronic kidney disease) stage 3, GFR 30-59 ml/min (HCC) Patient is scheduled to follow up with Dr. Jean RosenthalPirkle, [facility].   3. Hb-SS disease with crisis [facility](HCC) Patient is scheduled to [facility] on 10/23/2016 for a follow up of sickle cell anemia. Patient's most recent hemoglobin was 5.7. He is typically not transfused unless hemoglobin is < 5 due to alloantibodies.  - CBC  4. Erectile dysfunction, unspecified erectile dysfunction type Patient will schedule a follow up in 2 weeks for further work up and evaluation. He has never had a prostate exam.  May warrant a referral to urology.     RTC: 3 weeks for workup of ED   Nolon NationsLachina Moore Kalai Baca  MSN, FNP-C [facility] [address]  [place], [place] [address] [phone]

## 2017-09-21 NOTE — Patient Instructions (Signed)
Follow up in 3 weeks for further workup and evaluation of erectile dysfunction.   For pneumonia, [facility]. Take 500 mg today, take 250 mg on days 2-5.  Recommend albuterol 2 puffs every 6 hours as needed for shortness of breath, persistent cough, and wheezing.   Will repeat chest xray in 3 weeks    Community-Acquired Pneumonia, Adult Pneumonia is an infection of the lungs. One type of pneumonia can happen while a person is in a hospital. A different type can happen when a person is not in a hospital (community-acquired pneumonia). It is easy for this kind to spread from person to person. It can spread to you if you breathe near an infected person who coughs or sneezes. Some symptoms include:  A dry cough.  A wet (productive) cough.  Fever.  Sweating.  Chest pain.  Follow these instructions at home:  Take over-the-counter and prescription medicines only as told by your doctor. ? Only take cough medicine if you are losing sleep. ? If you were prescribed an antibiotic medicine, take it as told by your doctor. Do not stop taking the antibiotic even if you start to feel better.  Sleep with your head and neck raised (elevated). You can do this by putting a few pillows under your head, or you can sleep in a recliner.  Do not use tobacco products. These include cigarettes, chewing tobacco, and e-cigarettes. If you need help quitting, ask your doctor.  Drink enough water to keep your pee (urine) clear or pale yellow. A shot (vaccine) can help prevent pneumonia. Shots are often suggested for:  People older than 41 years of age.  People older than 41 years of age: ? Who are having cancer treatment. ? Who have long-term (chronic) lung disease. ? Who have problems with their body's defense system (immune system).  You may also prevent pneumonia if you take these actions:  Get the flu (influenza) shot every year.  Go to the dentist as  often as told.  Wash your hands often. If soap and water are not available, use hand sanitizer.  Contact a doctor if:  You have a fever.  You lose sleep because your cough medicine does not help. Get help right away if:  You are short of breath and it gets worse.  You have more chest pain.  Your sickness gets worse. This is very serious if: ? You are an older adult. ? Your body's defense system is weak.  You cough up blood. This information is not intended to replace advice given to you by your health care provider. Make sure you discuss any questions you have with your health care provider. Document Released: 02/29/2008 Document Revised: 02/18/2016 Document Reviewed: 01/07/2015 Elsevier Interactive Patient Education  [employer]2018 [employer].

## 2017-09-22 ENCOUNTER — Telehealth: Payer: Self-pay

## 2017-09-22 ENCOUNTER — Ambulatory Visit: Payer: Medicare Other | Admitting: Family Medicine

## 2017-09-22 LAB — CBC
HEMATOCRIT: 17.7 % — AB (ref 37.5–51.0)
Hemoglobin: 6.4 g/dL — CL (ref 13.0–17.7)
MCH: 37 pg — ABNORMAL HIGH (ref 26.6–33.0)
MCHC: 36.2 g/dL — ABNORMAL HIGH (ref 31.5–35.7)
MCV: 102 fL — AB (ref 79–97)
PLATELETS: 391 10*3/uL — AB (ref 150–379)
RBC: 1.73 x10E6/uL — AB (ref 4.14–5.80)
RDW: 24.4 % — AB (ref 12.3–15.4)
WBC: 5.5 10*3/uL (ref 3.4–10.8)

## 2017-09-22 NOTE — Telephone Encounter (Signed)
Called no answer. Left a message asking patient to return call and left callback number. Thanks!

## 2017-09-22 NOTE — Telephone Encounter (Signed)
-----   Message from Massie MaroonLachina M Hollis, [place] sent at 09/22/2017  6:00 AM EST ----- Regarding: lab results Inform patient that hemoglobin has increased to 6.4, which is at baseline. No transfusion warranted at this time. Remind patient of the importance of completing antibiotic as prescribed on yesterday for resolution of pneumonia. Also follow up in 3 weeks to repeat chest xray.   Thanks

## 2017-09-26 ENCOUNTER — Other Ambulatory Visit: Payer: Self-pay

## 2017-09-26 ENCOUNTER — Emergency Department (HOSPITAL_COMMUNITY): Payer: Self-pay

## 2017-09-26 ENCOUNTER — Emergency Department (HOSPITAL_COMMUNITY)
Admission: EM | Admit: 2017-09-26 | Discharge: 2017-09-26 | Disposition: A | Discharge status: Home / Self Care | Payer: Medicare Other | Attending: Emergency Medicine | Admitting: Emergency Medicine

## 2017-09-26 ENCOUNTER — Encounter (HOSPITAL_COMMUNITY): Payer: Self-pay

## 2017-09-26 DIAGNOSIS — B9789 Other viral agents as the cause of diseases classified elsewhere: Secondary | ICD-10-CM | POA: Insufficient documentation

## 2017-09-26 DIAGNOSIS — R05 Cough: Secondary | ICD-10-CM | POA: Diagnosis not present

## 2017-09-26 DIAGNOSIS — Z87891 Personal history of nicotine dependence: Secondary | ICD-10-CM | POA: Insufficient documentation

## 2017-09-26 DIAGNOSIS — J069 Acute upper respiratory infection, unspecified: Secondary | ICD-10-CM | POA: Diagnosis not present

## 2017-09-26 DIAGNOSIS — Z79899 Other long term (current) drug therapy: Secondary | ICD-10-CM | POA: Insufficient documentation

## 2017-09-26 DIAGNOSIS — D571 Sickle-cell disease without crisis: Secondary | ICD-10-CM | POA: Diagnosis not present

## 2017-09-26 DIAGNOSIS — N183 Chronic kidney disease, stage 3 (moderate): Secondary | ICD-10-CM | POA: Insufficient documentation

## 2017-09-26 LAB — CBC WITH DIFFERENTIAL/PLATELET
Band Neutrophils: 0 %
Basophils Absolute: 0 10*3/uL (ref 0.0–0.1)
Basophils Relative: 0 %
Blasts: 0 %
EOS PCT: 2 %
Eosinophils Absolute: 0.2 10*3/uL (ref 0.0–0.7)
HEMATOCRIT: 14.4 % — AB (ref 39.0–52.0)
Hemoglobin: 5.3 g/dL — CL (ref 13.0–17.0)
LYMPHS ABS: 3.4 10*3/uL (ref 0.7–4.0)
LYMPHS PCT: 41 %
MCH: 34.9 pg — ABNORMAL HIGH (ref 26.0–34.0)
MCHC: 36.8 g/dL — AB (ref 30.0–36.0)
MCV: 94.7 fL (ref 78.0–100.0)
MONOS PCT: 7 %
Metamyelocytes Relative: 0 %
Monocytes Absolute: 0.6 10*3/uL (ref 0.1–1.0)
Myelocytes: 0 %
NEUTROS ABS: 4 10*3/uL (ref 1.7–7.7)
NEUTROS PCT: 50 %
NRBC: 0 /100{WBCs}
Other: 0 %
Platelets: 345 10*3/uL (ref 150–400)
Promyelocytes Absolute: 0 %
RBC: 1.52 MIL/uL — ABNORMAL LOW (ref 4.22–5.81)
RDW: 26.9 % — AB (ref 11.5–15.5)
WBC: 8.2 10*3/uL (ref 4.0–10.5)

## 2017-09-26 LAB — COMPREHENSIVE METABOLIC PANEL
ALT: 30 U/L (ref 17–63)
AST: 67 U/L — ABNORMAL HIGH (ref 15–41)
Albumin: 3.7 g/dL (ref 3.5–5.0)
Alkaline Phosphatase: 143 U/L — ABNORMAL HIGH (ref 38–126)
Anion gap: 9 (ref 5–15)
BUN: 20 mg/dL (ref 6–20)
CO2: 22 mmol/L (ref 22–32)
Calcium: 9.5 mg/dL (ref 8.9–10.3)
Chloride: 103 mmol/L (ref 101–111)
Creatinine, Ser: 2.24 mg/dL — ABNORMAL HIGH (ref 0.61–1.24)
GFR, EST AFRICAN AMERICAN: 40 mL/min — AB (ref >60)
GFR, EST NON AFRICAN AMERICAN: 35 mL/min — AB (ref >60)
Glucose, Bld: 138 mg/dL — ABNORMAL HIGH (ref 65–99)
POTASSIUM: 4.1 mmol/L (ref 3.5–5.1)
Sodium: 134 mmol/L — ABNORMAL LOW (ref 135–145)
Total Bilirubin: 4.8 mg/dL — ABNORMAL HIGH (ref 0.3–1.2)
Total Protein: 8 g/dL (ref 6.5–8.1)

## 2017-09-26 LAB — I-STAT CG4 LACTIC ACID, ED
Lactic Acid, Venous: 1.79 mmol/L (ref 0.5–1.9)
Lactic Acid, Venous: 1.32 mmol/L (ref 0.5–1.9)

## 2017-09-26 LAB — RETICULOCYTES
RBC.: 1.42 MIL/uL — ABNORMAL LOW (ref 4.22–5.81)
Retic Count, Absolute: 325.2 10*3/uL — ABNORMAL HIGH (ref 19.0–186.0)
Retic Ct Pct: 22.9 % — ABNORMAL HIGH (ref 0.4–3.1)

## 2017-09-26 MED ORDER — IPRATROPIUM-ALBUTEROL 0.5-2.5 (3) MG/3ML IN SOLN
3.0000 mL | Freq: Once | RESPIRATORY_TRACT | Status: AC
  Administered 2017-09-26: 3 mL via RESPIRATORY_TRACT
  Filled 2017-09-26: qty 3

## 2017-09-26 NOTE — ED Provider Notes (Signed)
[facility] EMERGENCY DEPARTMENT Provider Note   CSN: [id number] Arrival date & time: 09/26/17  1408     History   Chief Complaint Chief Complaint  Patient presents with  . Cough    HPI Shawn Adams is a 42 y.o. male with h/o sickle cell anemia presents to ED for evaluation of persistent wet sounding cough x 3 weeks. He is concerned he has acute chest syndrome. Was diagnosed with pneumonia in ED three weeks ago but declined admission. He did not take prescribed doxycycline at discharge. Evaluated by PCP five days ago for persistent cough and prescribed azithromycin which he has been compliant with. States his cough has not improved despite antibiotic and was told to f/u in ED if symptoms not improving. Today is his last dose of azithromycin. Reports intermittent chest pain with cough only. No chest pain, fevers, chills, nausea, vomiting, shortness of breath, abdominal pain, dysuria. Has albuterol at home that he has not used.   HPI  Past Medical History:  Diagnosis Date  . Aneurysm (HCC)    Pt states happened in 2011, Brain   . Heart murmur   . Sarcoid   . Sickle cell anemia [facility])     Patient Active Problem List   Diagnosis Date Noted  . Headache 05/03/2016  . Anemia 05/03/2016  . Headache disorder   . Vitamin D deficiency 02/09/2016  . Heart murmur 02/09/2016  . Recurrent occipital headache 10/15/2015  . History of aneurysm 10/13/2015  . Left hip pain 07/30/2015  . Left ankle pain 07/08/2015  . Sickle cell crisis (HCC) 03/08/2015  . 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 Sickle cell anemia with crisis (HCC) 12/07/2014  . Sickle cell anemia (HCC) 06/20/2014  . CKD (chronic kidney disease) stage 3, GFR 30-59 ml/min (HCC) 06/20/2014  . Leukocytosis 06/20/2014  . Sickle cell pain crisis (HCC) 06/20/2014  . Hypercalcemia 06/20/2014  . Symptomatic anemia 05/31/2014    Past Surgical History:  Procedure Laterality Date  . LYMPH NODE BIOPSY           Home Medications    Prior to Admission medications   Medication Sig Start Date End Date Taking? Authorizing Provider  acetaminophen (TYLENOL) 500 MG tablet Take 1,000-1,500 mg by mouth every 6 (six) hours as needed for fever (pain).    Yes [provider]  albuterol (PROVENTIL HFA;VENTOLIN HFA) 108 (90 Base) MCG/ACT inhaler Inhale 2 puffs into the lungs every 4 (four) hours as needed for wheezing or shortness of breath (cough, shortness of breath or wheezing.). Patient taking differently: Inhale 2 puffs into the lungs every 4 (four) hours as needed for wheezing or shortness of breath (cough).  09/21/17  Yes Massie Maroon, FNP  azithromycin (ZITHROMAX) 250 MG tablet Take 2 tabs PO x 1 dose, then 1 tab PO QD x 4 days Patient taking differently: Take 250-500 mg by mouth See admin instructions. Start date 09/22/17: Take 2 tablets (500 mg) by mouth 1st day, then take 1 tablet (250 mg) daily on days 2-5 09/21/17  Yes Massie Maroon, FNP  Cholecalciferol (VITAMIN D PO) Take 1 tablet by mouth every Monday.   Yes [provider]  cyclobenzaprine (FLEXERIL) 10 MG tablet Take 10 mg by mouth 3 (three) times daily as needed for muscle spasms.  01/10/17  Yes [provider]  famotidine (PEPCID) 10 MG tablet Take 10 mg by mouth daily as needed for heartburn or indigestion.   Yes [provider]  folic  acid (FOLVITE) 1 MG tablet Take 1 tablet (1 mg total) by mouth daily. Patient taking differently: Take 1 mg by mouth at bedtime.  02/09/16  Yes Quentin AngstJegede, [name], MD  ondansetron (ZOFRAN) 4 MG tablet Take 1 tablet (4 mg total) by mouth every 6 (six) hours. Patient taking differently: Take 4 mg by mouth every 6 (six) hours as needed for nausea or vomiting.  12/21/15  Yes Hedges, Tinnie GensJeffrey, PA-C  Oxycodone HCl 20 MG TABS Take 20 mg by mouth every 4 (four) hours. scheduled   Yes [provider]  vitamin B-12 (CYANOCOBALAMIN) 1000 MCG tablet Take 1,000 mcg by  mouth at bedtime.    Yes [provider]  benzonatate (TESSALON) 100 MG capsule Take 1 capsule (100 mg total) by mouth every 8 (eight) hours. Patient not taking: Reported on 09/13/2017 09/06/17   Garlon HatchetSanders, Lisa M, PA-C  hydroxyurea (HYDREA) 500 MG capsule Take 2 capsules (1,000 mg total) by mouth daily. May take with food to minimize GI side effects. Patient taking differently: Take 1,000 mg by mouth at bedtime. May take with food to minimize GI side effects. 02/09/16   Quentin AngstJegede, [name], MD    Family History Family History  Problem Relation Age of Onset  . Diabetes Mother   . Hypertension Father   . Lupus Sister     Social History Social History   Tobacco Use  . Smoking status: Former Games developermoker  . Smokeless tobacco: Never Used  Substance Use Topics  . Alcohol use: No    Alcohol/week: 0.6 oz    Types: 1 Cans of beer per week    Comment: occasionally  . Drug use: No    Comment: Pt states uses 2 times a day     Allergies   Patient has no known allergies.   Review of Systems Review of Systems  Respiratory: Positive for cough.   Allergic/Immunologic: Positive for immunocompromised state (sickle cell anemia).  All other systems reviewed and are negative.    Physical Exam Updated Vital Signs BP (!) 143/65   Pulse (!) 105   Temp 98.5 F (36.9 C) (Oral)   Resp (!) 24   SpO2 (!) 88%   Physical Exam  Constitutional: Vital signs are normal. He appears well-developed.  Non-toxic appearance.  HENT:  Moist mucous membranes   Eyes:  PERRL and EOMs intact Scleral incterus  Neck: Full passive range of motion without pain. No neck rigidity.  No cervical adenopathy   Cardiovascular: Normal rate, regular rhythm, S1 normal, S2 normal and normal heart sounds.  No murmur heard. Pulses:      Carotid pulses are 2+ on the right side, and 2+ on the left side.      Radial pulses are 2+ on the right side, and 2+ on the left side.       Dorsalis pedis pulses are 2+ on the  right side, and 2+ on the left side.  No asymmetric LE edema  <2 capillary refill in fingers and toes No calf tenderness  Pulmonary/Chest: Effort normal. He has no decreased breath sounds. He has no wheezes. He has no rhonchi. He has no rales. He exhibits no tenderness.  Abdominal: Soft. Normal appearance and bowel sounds are normal. He exhibits no distension. There is no tenderness. There is no CVA tenderness.  Musculoskeletal:  UE and LE exposed, without erythema, edema, or warmth   Neurological: He is alert.  Skin: Skin is warm and dry. Capillary refill takes less than 2 seconds.  Psychiatric: He has a normal mood and affect. His speech is normal and behavior is normal. Judgment and thought content normal. Cognition and memory are normal.     ED Treatments / Results  Labs (all labs ordered are listed, but only abnormal results are displayed) Labs Reviewed  COMPREHENSIVE METABOLIC PANEL - Abnormal; Notable for the following components:      Result Value   Sodium 134 (*)    Glucose, Bld 138 (*)    Creatinine, Ser 2.24 (*)    AST 67 (*)    Alkaline Phosphatase 143 (*)    Total Bilirubin 4.8 (*)    GFR calc non Af Amer 35 (*)    GFR calc Af Amer 40 (*)    All other components within normal limits  CBC WITH DIFFERENTIAL/PLATELET - Abnormal; Notable for the following components:   RBC 1.52 (*)    Hemoglobin 5.3 (*)    HCT 14.4 (*)    MCH 34.9 (*)    MCHC 36.8 (*)    RDW 26.9 (*)    All other components within normal limits  RETICULOCYTES - Abnormal; Notable for the following components:   Retic Ct Pct 22.9 (*)    RBC. 1.42 (*)    Retic Count, Absolute 325.2 (*)    All other components within normal limits  URINALYSIS, ROUTINE W REFLEX MICROSCOPIC  I-STAT CG4 LACTIC ACID, ED  I-STAT CG4 LACTIC ACID, ED    EKG  EKG Interpretation None       Radiology Dg Chest 2 View  Result Date: 09/26/2017 CLINICAL DATA:  Sarcoidosis.  Chest pain.  Sickle cell. EXAM: CHEST  2 VIEW  COMPARISON:  09/06/2017 FINDINGS: Mediastinal and hilar adenopathy are stable. Mild cardiomegaly. Normal vascularity. Mild right basilar atelectasis is stable. Lungs otherwise clear. IMPRESSION: Stable hilar and mediastinal adenopathy. Electronically Signed   By: Jolaine Click M.D.   On: 09/26/2017 14:41   Ct Chest Wo Contrast  Result Date: 09/26/2017 CLINICAL DATA:  Shortness of breath. Follow-up for pneumonia. Diagnosed 1 month ago. Ongoing productive cough. History of sarcoidosis. EXAM: CT CHEST WITHOUT CONTRAST TECHNIQUE: Multidetector CT imaging of the chest was performed following the standard protocol without IV contrast. COMPARISON:  09/06/2017 FINDINGS: Cardiovascular: Cardiac enlargement. Normal caliber thoracic aorta. No pericardial effusion. Mediastinum/Nodes: Extensive mediastinal and bilateral hilar lymphadenopathy. Right paratracheal lymph nodes measuring up to about 4.4 cm maximal diameter. Appearance is similar to previous study and likely relates to patient's history of sarcoidosis. Esophagus is decompressed. Lungs/Pleura: Multiple small nodules demonstrated diffusely throughout the lungs, most prominent in the bases. Appearance is similar to previous study and likely relates to sarcoidosis. No consolidation or airspace disease. Airspace opacity seen previously in the lower lungs are resolving. No pleural effusions. No pneumothorax. Airways are patent. Upper Abdomen: Diffusely calcified and atrophic spleen suggests changes due to sickle cell. Musculoskeletal: Diffuse bone sclerosis consistent with sickle cell changes. No destructive bone lesions. IMPRESSION: 1. Prominent mediastinal and hilar lymphadenopathy with diffuse nodular pattern to the lungs likely related to sarcoidosis and without change. 2. Cardiac enlargement. 3. Calcified and atrophic spleen as well as diffuse bone sclerosis consistent with sickle cell changes. Electronically Signed   By: [name] M.D.   On: 09/26/2017 21:15      Procedures Procedures (including critical care time)  Medications Ordered in ED Medications  ipratropium-albuterol (DUONEB) 0.5-2.5 (3) MG/3ML nebulizer solution 3 mL (3 mLs Nebulization Given 09/26/17 2124)     Initial Impression / Assessment and Plan /  ED Course  I have reviewed the triage vital signs and the nursing notes.  Pertinent labs & imaging results that were available during my care of the patient were reviewed by me and considered in my medical decision making (see chart for details).  Clinical Course as of Sep 26 2314  Tue Sep 26, 2017  2041 Hemoglobin: (!!) 5.3 [CG]  2042 Creatinine: (!) 2.24 [CG]  2042 BUN: 20 [CG]  2042 Total Bilirubin: (!) 4.8 [CG]  2042 GFR, Est African American: (!) 40 [CG]  2312 Resp: 16 [CG]    Clinical Course User Index [CG] Liberty Handy, PA-C   42 yo male with h/o sickle cell anemia presents for evaluation of persistent cough after diagnosis of PNA three weeks ago.  Has finished course of azithromycin. No fevers, chills, shortness of breath or chest pain.   Exam is reassuring. Lungs clear.  Initial VS WNL. He became borderline tachycardic after duoneb. Tachypnea noted raising suspicion for PNA given cough, less likely PE as he has no CP or SOB. Had recent CTA which was negative for PE.  Work up today remarkable for anemia Hgb 5.4, this is patient's baseline and will defer transfusion. All other labs today consistent with h/o sickle cell anemia. CXR w/o infiltrate. Will obtain CT chest w/o contrast to r/o infiltrate. Suspicion is lower for PE.  Had negative CTA three weeks ago. He has no pleuritic CP or SOB. Cough most suspicious for small infiltrate missed on CXR. Pt shared with supervising physician who recommends CT chest w/o contrast to r/o PNA, this will avoid contrast given h/o underleying CKD.   Final Clinical Impressions(s) / ED Diagnoses   CT chest negative. Pt has remained w/o CP, SOB, while in ED. I don't think further work  up indicated today. Will d/c with symptomatic management. F/u in PCP in 2-3 days if symptoms not improving. Discussed return precautions.  Final diagnoses:  Viral URI with cough    ED Discharge Orders    None       Liberty Handy, PA-C 09/26/17 2316    Nira Conn, MD 09/30/17 [phone]

## 2017-09-26 NOTE — Discharge Instructions (Signed)
Lab work today shows hemoglobin 5.4, this appears to be within your baseline.   Chest x-ray and CT scan are normal.   Your cough is likely from post viral respiratory infection.   Use albuterol inhaler. Take any over the counter cough suppressant. Follow up with primary care doctor within 1 week if symptoms do not start to improve.   Return to ED for fevers, chills, shortness of breath

## 2017-09-26 NOTE — ED Triage Notes (Signed)
Pt presents for reevaluation of PNA. States was seen approximately 1 month ago for same, has been on ongoing abx with little improvement. Pt reports productive cough.

## 2017-09-27 NOTE — Telephone Encounter (Signed)
Have not been able to reach by phone, will mail letter. Thanks!

## 2017-10-05 ENCOUNTER — Encounter: Payer: Self-pay | Admitting: Family Medicine

## 2017-10-05 ENCOUNTER — Ambulatory Visit (INDEPENDENT_AMBULATORY_CARE_PROVIDER_SITE_OTHER): Payer: Medicare Other | Admitting: Family Medicine

## 2017-10-05 VITALS — BP 135/67 | HR 99 | Temp 97.5°F | Resp 18 | Ht 72.0 in | Wt 134.0 lb

## 2017-10-05 DIAGNOSIS — N529 Male erectile dysfunction, unspecified: Secondary | ICD-10-CM | POA: Diagnosis not present

## 2017-10-05 DIAGNOSIS — D571 Sickle-cell disease without crisis: Secondary | ICD-10-CM | POA: Diagnosis not present

## 2017-10-05 NOTE — Progress Notes (Signed)
Chief Complaint  Patient presents with  . Follow-up    pneumonia/ ed      Subjective:    Patient ID: Shawn Adams, male    DOB: 09/07/1976, 42 y.o.   MRN: [id number]  HPI Shawn Adams, a 42 year old male with a history of sickle cell anemia,HbSS, sarcoidosis, and stage III chronic kidney disease presents for evaluation of erectile dysfunction. Patient states that erectile dysfunction has been present for around 3 months. He endorses difficulty achieving and  maintaining an erection.  Patient states that he rarely achieves full erection. Libido is not affected. Risk factors for ED include anemia of chronic disease and sickle cell anemia. Shawn Adams most recent hemoglobin is 5.3, which is below baseline.   Past Medical History:  Diagnosis Date  . Aneurysm (HCC)    Pt states happened in 2011, Brain   . Heart murmur   . Sarcoid   . Sickle cell anemia (HCC)    Social History   Socioeconomic History  . Marital status: Single    Spouse name: Not on file  . Number of children: Not on file  . Years of education: Not on file  . Highest education level: Not on file  Social Needs  . Financial resource strain: Not on file  . Food insecurity - worry: Not on file  . Food insecurity - inability: Not on file  . Transportation needs - medical: Not on file  . Transportation needs - non-medical: Not on file  Occupational History  . Not on file  Tobacco Use  . Smoking status: Former Games developermoker  . Smokeless tobacco: Never Used  Substance and Sexual Activity  . Alcohol use: No    Alcohol/week: 0.6 oz    Types: 1 Cans of beer per week    Comment: occasionally  . Drug use: No    Comment: Pt states uses 2 times a day  . Sexual activity: Yes    Birth control/protection: None  Other Topics Concern  . Not on file  Social History Narrative  . Not on file   Immunization History  Administered Date(s) Administered  . Influenza,inj,Quad PF,6+ Mos 06/21/2014, 07/08/2015  . Influenza,inj,quad, With  Preservative 05/11/2016  . Tdap 07/08/2015    Review of Systems  Constitutional: Negative.   HENT: Negative.   Eyes: Negative.   Respiratory: Positive for shortness of breath (occasional). Negative for wheezing.   Cardiovascular: Negative.  Negative for chest pain and leg swelling.  Endocrine: Negative.   Genitourinary: Negative.  Negative for discharge, dysuria, flank pain, frequency, hematuria, penile pain, penile swelling, scrotal swelling, testicular pain and urgency.       Erectile dysfunction  Musculoskeletal: Negative.   Skin: Negative.  Negative for pallor and rash.  Allergic/Immunologic: Negative.   Neurological: Negative.   Hematological: Negative.   Psychiatric/Behavioral: Negative.        Objective:   Physical Exam  Constitutional: He is oriented to person, place, and time.  Eyes: Pupils are equal, round, and reactive to light. Scleral icterus is present.  Neck: Normal range of motion. Neck supple.  Cardiovascular: Normal rate, regular rhythm, normal heart sounds and intact distal pulses.  Pulmonary/Chest: Effort normal. No respiratory distress. He has no wheezes. He exhibits no tenderness.  Abdominal: Soft. Bowel sounds are normal.  Genitourinary: Rectum normal and testes normal. Rectal exam shows no external hemorrhoid, no fissure, no mass, no tenderness, anal tone normal and guaiac negative stool. Circumcised.  Musculoskeletal: Normal range of motion.  Neurological: He is alert and oriented  to person, place, and time.  Skin: Skin is warm and dry.  Psychiatric: He has a normal mood and affect. His behavior is normal. Judgment and thought content normal.      BP 135/67 (BP Location: Left Arm, Patient Position: Sitting, Cuff Size: Normal)   Pulse 99   Temp (!) 97.5 F (36.4 C) (Oral)   Resp 18   Ht 6' (1.829 m)   Wt 134 lb (60.8 kg)   SpO2 95%   BMI 18.17 kg/m  Assessment & Plan:   Erectile dysfunction, unspecified erectile dysfunction type - Urinalysis,  Routine w reflex microscopic - PSA - Testosterone - Ambulatory referral to Urology  Hb-SS disease without crisis (HCC) Hemoglobin is 5.3, which is below baseline. Baseline typically between 7-8.  Shawn Adams has been lost to follow up with hematology. His last appointment with Dr. Jolyne Loa was in October 2018.  Patient advised to schedule appointment with hematology.  If hemoglobin remains below baseline, will transfuse packed red blood cells at day infusion center.   RTC: Will follow up by phone after reviewing laboratory results   Nolon Nations  MSN, FNP-C Patient Care [facility] [address] [place], [place] [address] [phone]   - CBC with Differential

## 2017-10-06 ENCOUNTER — Ambulatory Visit (HOSPITAL_COMMUNITY)
Admission: RE | Admit: 2017-10-06 | Discharge: 2017-10-06 | Disposition: A | Discharge status: Home / Self Care | Payer: Medicaid Other | Source: Ambulatory Visit | Attending: Family Medicine | Admitting: Family Medicine

## 2017-10-06 ENCOUNTER — Other Ambulatory Visit: Payer: Self-pay | Admitting: Family Medicine

## 2017-10-06 ENCOUNTER — Telehealth: Payer: Self-pay | Admitting: Family Medicine

## 2017-10-06 DIAGNOSIS — D571 Sickle-cell disease without crisis: Secondary | ICD-10-CM

## 2017-10-06 LAB — CBC WITH DIFFERENTIAL/PLATELET
BASOS ABS: 0.1 10*3/uL (ref 0.0–0.2)
Basos: 1 %
EOS (ABSOLUTE): 0.3 10*3/uL (ref 0.0–0.4)
EOS: 4 %
HEMOGLOBIN: 5.1 g/dL — AB (ref 13.0–17.7)
Hematocrit: 14.4 % — CL (ref 37.5–51.0)
LYMPHS: 42 %
Lymphocytes Absolute: 3.4 10*3/uL — ABNORMAL HIGH (ref 0.7–3.1)
MCH: 33.6 pg — ABNORMAL HIGH (ref 26.6–33.0)
MCHC: 35.4 g/dL (ref 31.5–35.7)
MCV: 95 fL (ref 79–97)
Monocytes Absolute: 0.6 10*3/uL (ref 0.1–0.9)
Monocytes: 7 %
NRBC: 18 % — AB (ref 0–0)
Neutrophils Absolute: 3.6 10*3/uL (ref 1.4–7.0)
Neutrophils: 44 %
PLATELETS: 415 10*3/uL — AB (ref 150–379)
RBC: 1.52 x10E6/uL — AB (ref 4.14–5.80)
RDW: 25.7 % — ABNORMAL HIGH (ref 12.3–15.4)
WBC: 8.1 10*3/uL (ref 3.4–10.8)

## 2017-10-06 LAB — URINALYSIS, ROUTINE W REFLEX MICROSCOPIC
Bilirubin, UA: NEGATIVE
Glucose, UA: NEGATIVE
KETONES UA: NEGATIVE
LEUKOCYTES UA: NEGATIVE
NITRITE UA: NEGATIVE
SPEC GRAV UA: 1.013 (ref 1.005–1.030)
Urobilinogen, Ur: 1 mg/dL (ref 0.2–1.0)
pH, UA: 6 (ref 5.0–7.5)

## 2017-10-06 LAB — MICROSCOPIC EXAMINATION: CASTS: NONE SEEN /LPF

## 2017-10-06 LAB — TESTOSTERONE: TESTOSTERONE: 473 ng/dL (ref 264–916)

## 2017-10-06 LAB — PSA: Prostate Specific Ag, Serum: 1.2 ng/mL (ref 0.0–4.0)

## 2017-10-06 LAB — IMMATURE CELLS: Metamyelocytes: 2 % — ABNORMAL HIGH (ref 0–0)

## 2017-10-06 NOTE — Telephone Encounter (Addendum)
Shawn DollarLuke Adams, a 42 year old male with a history of sickle cell anemia, Hbss presented on yesterday for an evaluation of ED. Hemoglobin was critically low at 5.1, his previous hemoglobin was 5.3. Patient is scheduled for a type and crossmatch today at 3:00. Reviewed previous labs baseline hemoglobin is between 6-7. Will transfuse 1 unit of packed red blood cells.   The patient was given clear instructions to go to ER or return to medical center if symptoms do not improve, worsen or new problems develop. The patient verbalized understanding.   Shawn NationsLachina Moore Jahbari Repinski  MSN, FNP-C Patient Care [facility] [address]  [place], [place] [address] [phone]

## 2017-10-06 NOTE — Progress Notes (Signed)
Patient presented for blood draw for type and cross.  Denies and shortness of breath or weakness.  Patient aware of Hgb of 5.1 and to come to clinic Monday for transfusion.  Vital signs were taken and all WNL.  Patient ambulatory without distress.  Advised to report to emergency department if he has any weakness or shortness of breath.

## 2017-10-09 ENCOUNTER — Other Ambulatory Visit: Payer: Self-pay | Admitting: Family Medicine

## 2017-10-09 ENCOUNTER — Ambulatory Visit (HOSPITAL_COMMUNITY)
Admission: RE | Admit: 2017-10-09 | Discharge: 2017-10-09 | Disposition: A | Discharge status: Home / Self Care | Payer: Medicaid Other | Source: Ambulatory Visit | Attending: Family Medicine | Admitting: Family Medicine

## 2017-10-09 DIAGNOSIS — D5700 Hb-SS disease with crisis, unspecified: Secondary | ICD-10-CM

## 2017-10-09 DIAGNOSIS — D571 Sickle-cell disease without crisis: Secondary | ICD-10-CM

## 2017-10-09 LAB — HEMOGLOBIN AND HEMATOCRIT, BLOOD
HCT: 17.5 % — ABNORMAL LOW (ref 39.0–52.0)
HEMOGLOBIN: 6.5 g/dL — AB (ref 13.0–17.0)

## 2017-10-09 LAB — PREPARE RBC (CROSSMATCH)

## 2017-10-09 MED ORDER — OXYCODONE HCL 5 MG PO TABS
10.0000 mg | ORAL_TABLET | Freq: Once | ORAL | Status: AC
  Administered 2017-10-09: 10 mg via ORAL
  Filled 2017-10-09: qty 2

## 2017-10-09 MED ORDER — SODIUM CHLORIDE 0.9 % IV SOLN: Freq: Once | INTRAVENOUS | Status: DC

## 2017-10-09 NOTE — Progress Notes (Signed)
Diagnosis: Sickle Cell Anemia  Provider: Julianne HandlerHollis, [place], FNP  Procedure: Patient received 1 unit of PRBCs.  Also, received oxycodone for sickle cell pain.  Labs drawn. Patienttolerated well. Instructions given to hydrate and take schedule medication for sickle cell pain.  If pain continues to call in morning to be triage.  Patient verbalized understanding.  Post procedure: Patient alert, oriented and ambulatory at discharge.

## 2017-10-09 NOTE — Discharge Instructions (Signed)

## 2017-10-10 ENCOUNTER — Telehealth: Payer: Self-pay | Admitting: Family Medicine

## 2017-10-10 LAB — TYPE AND SCREEN
ABO/RH(D): B POS
Antibody Screen: NEGATIVE
DONOR AG TYPE: NEGATIVE
UNIT DIVISION: 0

## 2017-10-10 LAB — BPAM RBC
Blood Product Expiration Date: 201902162359
ISSUE DATE / TIME: 201901141144
Unit Type and Rh: 5100

## 2017-10-10 NOTE — Telephone Encounter (Signed)
Shawn Adams, a 42 year old male with a history of sickle cell anemia, hemoglobin SS was evaluated in the day infusion center on 10/10/2017 for anemia of chronic disease.  His hemoglobin was 5.1 prior to transfusion of packed red blood cells.  Posttransfusion hemoglobin was 6.5.  Will recheck hemoglobin and hematocrit in 1 week.  Also patient is scheduled to follow-up with [facility]. [place]oly[facility] on October 23, 2017.   Shawn NationsLachina Moore Lanis Storlie  MSN, FNP-C Patient Care [facility] [address]  [place], [place] [address] [phone]

## 2017-10-16 ENCOUNTER — Other Ambulatory Visit: Payer: Medicaid Other

## 2017-10-16 DIAGNOSIS — D5700 Hb-SS disease with crisis, unspecified: Secondary | ICD-10-CM

## 2017-10-17 ENCOUNTER — Telehealth: Payer: Self-pay

## 2017-10-17 ENCOUNTER — Telehealth: Payer: Self-pay | Admitting: Family Medicine

## 2017-10-17 LAB — HEMOGLOBIN AND HEMATOCRIT, BLOOD
Hematocrit: 17.6 % — CL (ref 37.5–51.0)
Hemoglobin: 6.4 g/dL — CL (ref 13.0–17.7)

## 2017-10-17 NOTE — Telephone Encounter (Signed)
Per [facility] patient hematocrit was 17.6 and hemoglobin was 6.4. Result were given to provider Joaquin CourtsKimberly Harris.

## 2017-10-17 NOTE — Telephone Encounter (Signed)
Mariam DollarLuke Demartin, 42 year old male, with sickle cell anemia, CKD,  presented yesterday for lab visit. Hemoglobin and Hematocrit 6.4 hemoglobin and 17.5 hematocrit. 6.4 is consistent with baseline. Will forward results to [facility] Hollis FNP-C.

## 2017-11-13 ENCOUNTER — Other Ambulatory Visit: Payer: Medicaid Other

## 2017-11-13 ENCOUNTER — Other Ambulatory Visit: Payer: Self-pay | Admitting: Family Medicine

## 2017-11-13 DIAGNOSIS — D571 Sickle-cell disease without crisis: Secondary | ICD-10-CM

## 2017-11-14 ENCOUNTER — Telehealth: Payer: Self-pay

## 2017-11-14 LAB — CBC WITH DIFFERENTIAL/PLATELET
BASOS ABS: 0 10*3/uL (ref 0.0–0.2)
Basos: 0 %
EOS (ABSOLUTE): 0.3 10*3/uL (ref 0.0–0.4)
Eos: 5 %
Hematocrit: 18.9 % — ABNORMAL LOW (ref 37.5–51.0)
Hemoglobin: 6.7 g/dL — CL (ref 13.0–17.7)
IMMATURE GRANS (ABS): 0.1 10*3/uL (ref 0.0–0.1)
IMMATURE GRANULOCYTES: 1 %
LYMPHS: 34 %
Lymphocytes Absolute: 1.8 10*3/uL (ref 0.7–3.1)
MCH: 37.4 pg — ABNORMAL HIGH (ref 26.6–33.0)
MCHC: 35.4 g/dL (ref 31.5–35.7)
MCV: 106 fL — ABNORMAL HIGH (ref 79–97)
MONOCYTES: 6 %
Monocytes Absolute: 0.3 10*3/uL (ref 0.1–0.9)
NEUTROS PCT: 54 %
NRBC: 16 % — ABNORMAL HIGH (ref 0–0)
Neutrophils Absolute: 2.9 10*3/uL (ref 1.4–7.0)
PLATELETS: 405 10*3/uL — AB (ref 150–379)
RBC: 1.79 x10E6/uL — AB (ref 4.14–5.80)
RDW: 23.5 % — AB (ref 12.3–15.4)
WBC: 5.4 10*3/uL (ref 3.4–10.8)

## 2017-11-14 NOTE — Telephone Encounter (Signed)
-----   Message from Massie MaroonLachina M Hollis, OregonFNP sent at 11/14/2017  1:33 PM EST ----- Regarding: lab results Please inform patient that hemoglobin level has increased to 6.7, which is consistent with baseline. Continue to hydrate frequently and take all medications as prescribed. Schedule follow up with hematology and follow up with me in 3 months or as needed.   Thanks

## 2017-11-14 NOTE — Telephone Encounter (Signed)
Called and spoke with patient, informed of hemoglobin at 6.7 which is consistent with baseline. Advised to continue to hydrate frequently and take all medications as prescribed. Asked to follow up with hematology and with us in 3 months or as needed. patient verbalized understanding and had no questions at this time. Thanks!

## 2017-11-16 DIAGNOSIS — Z87891 Personal history of nicotine dependence: Secondary | ICD-10-CM | POA: Diagnosis not present

## 2017-11-16 DIAGNOSIS — J410 Simple chronic bronchitis: Secondary | ICD-10-CM | POA: Diagnosis not present

## 2017-11-16 DIAGNOSIS — D860 Sarcoidosis of lung: Secondary | ICD-10-CM | POA: Diagnosis not present

## 2017-11-16 DIAGNOSIS — Z86718 Personal history of other venous thrombosis and embolism: Secondary | ICD-10-CM | POA: Diagnosis not present

## 2017-11-16 DIAGNOSIS — Z7951 Long term (current) use of inhaled steroids: Secondary | ICD-10-CM | POA: Diagnosis not present

## 2017-11-16 DIAGNOSIS — R0602 Shortness of breath: Secondary | ICD-10-CM | POA: Diagnosis not present

## 2017-11-16 DIAGNOSIS — J4540 Moderate persistent asthma, uncomplicated: Secondary | ICD-10-CM | POA: Diagnosis not present

## 2017-11-16 NOTE — Telephone Encounter (Signed)
Medication refill

## 2017-12-06 ENCOUNTER — Ambulatory Visit: Payer: Medicaid Other | Admitting: Family Medicine

## 2017-12-08 ENCOUNTER — Ambulatory Visit (INDEPENDENT_AMBULATORY_CARE_PROVIDER_SITE_OTHER): Payer: Self-pay | Admitting: Family Medicine

## 2017-12-08 ENCOUNTER — Encounter: Payer: Self-pay | Admitting: Family Medicine

## 2017-12-08 VITALS — BP 123/72 | HR 87 | Temp 98.3°F | Resp 14 | Ht 72.0 in | Wt 135.0 lb

## 2017-12-08 DIAGNOSIS — D571 Sickle-cell disease without crisis: Secondary | ICD-10-CM | POA: Diagnosis not present

## 2017-12-08 DIAGNOSIS — I809 Phlebitis and thrombophlebitis of unspecified site: Secondary | ICD-10-CM

## 2017-12-08 NOTE — Progress Notes (Signed)
Subjective:    Patient ID: Shawn Adams, male    DOB: 30-May-1976, 42 y.o.   MRN: [id number]  HPI A 42 year old male with a history of sickle cell anemia, hemoglobin SS, sarcoidosis, and stage III chronic kidney disease presents for evaluation of sickle cell.  Patient is followed by hematology consistently for sickle cell anemia he is taking daily folic acid and hydroxyurea.  Patient has not experienced a crisis over the past 6 months.  Patient received 1 unit of packed red blood cells 1 month ago due to anemia of chronic disease.  Posttransfusion hemoglobin was 6.5.  Patient currently denies fatigue, shortness of breath, abdominal pain, nausea, vomiting, or diarrhea.  Patient endorses right arm pain.  He attributes right arm pain to a recent blood draw.  Past Medical History:  Diagnosis Date  . Aneurysm (HCC)    Pt states happened in 2011, Brain   . Heart murmur   . Sarcoid   . Sickle cell anemia (HCC)    Social History   Socioeconomic History  . Marital status: Single    Spouse name: Not on file  . Number of children: Not on file  . Years of education: Not on file  . Highest education level: Not on file  Social Needs  . Financial resource strain: Not on file  . Food insecurity - worry: Not on file  . Food insecurity - inability: Not on file  . Transportation needs - medical: Not on file  . Transportation needs - non-medical: Not on file  Occupational History  . Not on file  Tobacco Use  . Smoking status: Former Games developer  . Smokeless tobacco: Never Used  Substance and Sexual Activity  . Alcohol use: No    Alcohol/week: 0.6 oz    Types: 1 Cans of beer per week    Comment: occasionally  . Drug use: No    Comment: Pt states uses 2 times a day  . Sexual activity: Yes    Birth control/protection: None  Other Topics Concern  . Not on file  Social History Narrative  . Not on file   Review of Systems  Constitutional: Negative.  Negative for fatigue.  HENT: Negative.     Eyes: Negative.   Respiratory: Negative.   Cardiovascular: Negative.   Gastrointestinal: Negative.   Endocrine: Negative.   Genitourinary: Negative.   Musculoskeletal:       Right arm tenderness  Allergic/Immunologic: Negative.   Neurological: Negative.   Hematological: Negative.   Psychiatric/Behavioral: Negative.        Objective:   Physical Exam  Constitutional: He is oriented to person, place, and time. He appears well-developed and well-nourished.  HENT:  Head: Normocephalic.  Eyes: Pupils are equal, round, and reactive to light.  Neck: Normal range of motion. Neck supple.  Pulmonary/Chest: Effort normal and breath sounds normal.  Abdominal: Soft. Bowel sounds are normal.  Musculoskeletal: Normal range of motion.       Arms: Tender to palpation  Neurological: He is alert and oriented to person, place, and time.  Skin: Skin is warm and dry.  Psychiatric: He has a normal mood and affect. His behavior is normal. Judgment and thought content normal.      BP 123/72 (BP Location: Left Arm, Patient Position: Sitting, Cuff Size: Normal)   Pulse 87   Temp 98.3 F (36.8 C) (Oral)   Resp 14   Ht 6' (1.829 m)   Wt 135 lb (61.2 kg)   SpO2 96%  BMI 18.31 kg/m     Assessment & Plan:  1. Hb-SS disease without crisis (HCC)  Sickle cell disease - Continue Hydrea at current dosage per hematology. We discussed the need for good hydration, monitoring of hydration status, avoidance of heat, cold, stress, and infection triggers. We discussed the risks and benefits of Hydrea, including bone marrow suppression, the possibility of GI upset, skin ulcers, hair thinning, and teratogenicity. The patient was reminded of the need to seek medical attention of any symptoms of bleeding, anemia, or infection. Continue folic acid 1 mg daily to prevent aplastic bone marrow crises.    Pulmonary evaluation - Patient scheduled to follow up with pulmonology for sarcoidosis Patient denies severe  recurrent wheezes, shortness of breath with exercise, or persistent cough. If these symptoms develop, pulmonary function tests with spirometry will be ordered, and if abnormal, plan on referral to Pulmonology for further evaluation.  Cardiac - Routine screening for pulmonary hypertension is not recommended.  Eye - High risk of proliferative retinopathy. Annual eye exam with retinal exam recommended to patient.  Immunization status - Shawn Adams is up to date with vaccinations.   Acute and chronic painful episodes - Pain medications managed by hematology  - CBC with Differential  2. Phlebitis Mild arm pain related to recent IV blood draw. Recommend that patient apply warm,moist compresses as needed. No further intervention warranted at this time.    The patient was given clear instructions to go to ER or return to medical center if symptoms do not improve, worsen or new problems develop. The patient verbalized understanding. Will notify patient with laboratory results.   Nolon NationsLachina Moore Trinadee Verhagen  MSN, FNP-C Patient Care [facility] [address]  [place], [place] [id number] [phone]

## 2017-12-08 NOTE — Patient Instructions (Signed)
  Will follow up by phone with any abnormal laboratory results.    Sickle Cell Anemia, Adult Sickle cell anemia is a condition where your red blood cells are shaped like sickles. Red blood cells carry oxygen through the body. Sickle-shaped red blood cells do not live as long as normal red blood cells. They also clump together and block blood from flowing through the blood vessels. These things prevent the body from getting enough oxygen. Sickle cell anemia causes organ damage and pain. It also increases the risk of infection. Follow these instructions at home:  Drink enough fluid to keep your pee (urine) clear or pale yellow. Drink more in hot weather and during exercise.  Do not smoke. Smoking lowers oxygen levels in the blood.  Only take over-the-counter or prescription medicines as told by your doctor.  Take antibiotic medicines as told by your doctor. Make sure you finish them even if you start to feel better.  Take supplements as told by your doctor.  Consider wearing a medical alert bracelet. This tells anyone caring for you in an emergency of your condition.  When traveling, keep your medical information, doctors' names, and the medicines you take with you at all times.  If you have a fever, do not take fever medicines right away. This could cover up a problem. Tell your doctor.  Keep all follow-up visits with your doctor. Sickle cell anemia requires regular medical care. Contact a doctor if: You have a fever. Get help right away if:  You feel dizzy or faint.  You have new belly (abdominal) pain, especially on the left side near the stomach area.  You have a lasting, often uncomfortable and painful erection of the penis (priapism). If it is not treated right away, you will become unable to have sex (impotence).  You have numbness in your arms or legs or you have a hard time moving them.  You have a hard time talking.  You have a fever or lasting symptoms for more than 2-3  days.  You have a fever and your symptoms suddenly get worse.  You have signs or symptoms of infection. These include: ? Chills. ? Being more tired than normal (lethargy). ? Irritability. ? Poor eating. ? Throwing up (vomiting).  You have pain that is not helped with medicine.  You have shortness of breath.  You have pain in your chest.  You are coughing up pus-like or bloody mucus.  You have a stiff neck.  Your feet or hands swell or have pain.  Your belly looks bloated.  Your joints hurt. This information is not intended to replace advice given to you by your health care provider. Make sure you discuss any questions you have with your health care provider. Document Released: 07/03/2013 Document Revised: 02/18/2016 Document Reviewed: 04/24/2013 Elsevier Interactive Patient Education  2017 Elsevier Inc.  

## 2017-12-09 LAB — CBC WITH DIFFERENTIAL/PLATELET
BASOS ABS: 0 10*3/uL (ref 0.0–0.2)
Basos: 1 %
EOS (ABSOLUTE): 0.1 10*3/uL (ref 0.0–0.4)
Eos: 2 %
Hematocrit: 18.7 % — ABNORMAL LOW (ref 37.5–51.0)
Hemoglobin: 6.8 g/dL — CL (ref 13.0–17.7)
LYMPHS: 36 %
Lymphocytes Absolute: 1.7 10*3/uL (ref 0.7–3.1)
MCH: 38.6 pg — AB (ref 26.6–33.0)
MCHC: 36.4 g/dL — ABNORMAL HIGH (ref 31.5–35.7)
MCV: 106 fL — AB (ref 79–97)
MONOS ABS: 0.2 10*3/uL (ref 0.1–0.9)
Monocytes: 5 %
NEUTROS ABS: 2.6 10*3/uL (ref 1.4–7.0)
NEUTROS PCT: 56 %
NRBC: 14 % — ABNORMAL HIGH (ref 0–0)
Platelets: 373 10*3/uL (ref 150–379)
RBC: 1.76 x10E6/uL — CL (ref 4.14–5.80)
RDW: 23 % — AB (ref 12.3–15.4)
WBC: 4.7 10*3/uL (ref 3.4–10.8)

## 2017-12-11 ENCOUNTER — Telehealth: Payer: Self-pay

## 2017-12-11 LAB — POCT URINALYSIS DIP (DEVICE)
Bilirubin Urine: NEGATIVE
GLUCOSE, UA: NEGATIVE mg/dL
Ketones, ur: NEGATIVE mg/dL
LEUKOCYTES UA: NEGATIVE
Nitrite: NEGATIVE
PROTEIN: 100 mg/dL — AB
SPECIFIC GRAVITY, URINE: 1.01 (ref 1.005–1.030)
Urobilinogen, UA: 0.2 mg/dL (ref 0.0–1.0)
pH: 6 (ref 5.0–8.0)

## 2017-12-11 NOTE — Telephone Encounter (Signed)
Called and spoke with patient and advised that hemoglobin is 6.8 which is consistent with baseline and no further work up is needed at this time. Asked that he keep next scheduled follow up with hematology and us as scheduled. Patient verbalized understanding. Thanks!

## 2017-12-11 NOTE — Telephone Encounter (Signed)
-----   Message from Massie MaroonLachina M Hollis, [place]NP sent at 12/09/2017 11:48 AM EDT ----- Regarding: lab results Please inform patient that hemoglobin is 6.8, which is consistent with baseline, no further work up needed at this time. He is to follow up with hematology as scheduled.    Thanks

## 2018-04-09 ENCOUNTER — Emergency Department (HOSPITAL_COMMUNITY): Payer: No Typology Code available for payment source

## 2018-04-09 ENCOUNTER — Encounter (HOSPITAL_COMMUNITY): Payer: Self-pay | Admitting: *Deleted

## 2018-04-09 ENCOUNTER — Emergency Department (HOSPITAL_COMMUNITY)
Admission: EM | Admit: 2018-04-09 | Discharge: 2018-04-09 | Disposition: A | Discharge status: Home / Self Care | Payer: No Typology Code available for payment source | Attending: Emergency Medicine | Admitting: Emergency Medicine

## 2018-04-09 DIAGNOSIS — M542 Cervicalgia: Secondary | ICD-10-CM

## 2018-04-09 DIAGNOSIS — Z87891 Personal history of nicotine dependence: Secondary | ICD-10-CM | POA: Insufficient documentation

## 2018-04-09 DIAGNOSIS — M545 Low back pain, unspecified: Secondary | ICD-10-CM

## 2018-04-09 DIAGNOSIS — N183 Chronic kidney disease, stage 3 (moderate): Secondary | ICD-10-CM | POA: Insufficient documentation

## 2018-04-09 DIAGNOSIS — M62830 Muscle spasm of back: Secondary | ICD-10-CM | POA: Diagnosis not present

## 2018-04-09 DIAGNOSIS — Z79899 Other long term (current) drug therapy: Secondary | ICD-10-CM | POA: Diagnosis not present

## 2018-04-09 DIAGNOSIS — M62838 Other muscle spasm: Secondary | ICD-10-CM | POA: Insufficient documentation

## 2018-04-09 MED ORDER — CYCLOBENZAPRINE HCL 10 MG PO TABS: 10.0000 mg | ORAL_TABLET | Freq: Three times a day (TID) | ORAL | 0 refills | Status: DC | PRN

## 2018-04-09 MED ORDER — HYDROCODONE-ACETAMINOPHEN 5-325 MG PO TABS
1.0000 | ORAL_TABLET | Freq: Once | ORAL | Status: AC
Start: 2018-04-09 — End: 2018-04-09
  Administered 2018-04-09: 1 via ORAL
  Filled 2018-04-09: qty 1

## 2018-04-09 MED ORDER — NAPROXEN 500 MG PO TABS: 500.0000 mg | ORAL_TABLET | Freq: Two times a day (BID) | ORAL | 0 refills | Status: DC | PRN

## 2018-04-09 NOTE — Discharge Instructions (Addendum)
Take naprosyn as directed for inflammation and pain (take on a schedule 2x/day with food for the next 2-3 days, then as needed thereafter) with tylenol for breakthrough pain and flexeril for muscle relaxation. Do not drive or operate machinery with muscle relaxant use. Ice to areas of soreness for the next 24 hours and then may move to heat, no more than 20 minutes at a time every hour for each. Expect to be sore for the next few days and follow up with primary care physician for recheck of ongoing symptoms in the next 1 week. Return to ER for emergent changing or worsening of symptoms.
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
## 2018-04-09 NOTE — ED Notes (Signed)
Pt eating outside food, no complaints at present

## 2018-04-23 DIAGNOSIS — D638 Anemia in other chronic diseases classified elsewhere: Secondary | ICD-10-CM | POA: Diagnosis not present

## 2018-04-23 DIAGNOSIS — D571 Sickle-cell disease without crisis: Secondary | ICD-10-CM | POA: Diagnosis not present

## 2018-04-23 DIAGNOSIS — G8929 Other chronic pain: Secondary | ICD-10-CM | POA: Diagnosis not present

## 2018-04-23 DIAGNOSIS — M7918 Myalgia, other site: Secondary | ICD-10-CM | POA: Diagnosis not present

## 2018-04-23 DIAGNOSIS — D649 Anemia, unspecified: Secondary | ICD-10-CM | POA: Diagnosis not present

## 2018-04-23 DIAGNOSIS — D869 Sarcoidosis, unspecified: Secondary | ICD-10-CM | POA: Diagnosis not present

## 2018-04-23 DIAGNOSIS — T8089XA Other complications following infusion, transfusion and therapeutic injection, initial encounter: Secondary | ICD-10-CM | POA: Diagnosis not present

## 2018-04-23 DIAGNOSIS — N183 Chronic kidney disease, stage 3 (moderate): Secondary | ICD-10-CM | POA: Diagnosis not present

## 2018-04-23 DIAGNOSIS — E559 Vitamin D deficiency, unspecified: Secondary | ICD-10-CM | POA: Diagnosis not present

## 2018-04-23 DIAGNOSIS — J4540 Moderate persistent asthma, uncomplicated: Secondary | ICD-10-CM | POA: Diagnosis not present

## 2018-04-25 ENCOUNTER — Ambulatory Visit: Payer: Medicare Other | Admitting: Family Medicine

## 2018-05-04 ENCOUNTER — Ambulatory Visit (INDEPENDENT_AMBULATORY_CARE_PROVIDER_SITE_OTHER): Payer: Medicare Other | Admitting: Family Medicine

## 2018-05-04 ENCOUNTER — Ambulatory Visit: Payer: Medicare Other | Admitting: Family Medicine

## 2018-05-04 ENCOUNTER — Encounter: Payer: Self-pay | Admitting: Family Medicine

## 2018-05-04 VITALS — BP 128/70 | HR 97 | Temp 98.1°F | Resp 18 | Ht 72.0 in | Wt 128.0 lb

## 2018-05-04 DIAGNOSIS — E559 Vitamin D deficiency, unspecified: Secondary | ICD-10-CM | POA: Diagnosis not present

## 2018-05-04 DIAGNOSIS — D5700 Hb-SS disease with crisis, unspecified: Secondary | ICD-10-CM

## 2018-05-04 NOTE — Patient Instructions (Signed)
Sickle Cell Anemia, Adult °Sickle cell anemia is a condition where your red blood cells are shaped like sickles. Red blood cells carry oxygen through the body. Sickle-shaped red blood cells do not live as long as normal red blood cells. They also clump together and block blood from flowing through the blood vessels. These things prevent the body from getting enough oxygen. Sickle cell anemia causes organ damage and pain. It also increases the risk of infection. °Follow these instructions at home: °· Drink enough fluid to keep your pee (urine) clear or pale yellow. Drink more in hot weather and during exercise. °· Do not smoke. Smoking lowers oxygen levels in the blood. °· Only take over-the-counter or prescription medicines as told by your doctor. °· Take antibiotic medicines as told by your doctor. Make sure you finish them even if you start to feel better. °· Take supplements as told by your doctor. °· Consider wearing a medical alert bracelet. This tells anyone caring for you in an emergency of your condition. °· When traveling, keep your medical information, doctors' names, and the medicines you take with you at all times. °· If you have a fever, do not take fever medicines right away. This could cover up a problem. Tell your doctor. °· Keep all follow-up visits with your doctor. Sickle cell anemia requires regular medical care. °Contact a doctor if: °You have a fever. °Get help right away if: °· You feel dizzy or faint. °· You have new belly (abdominal) pain, especially on the left side near the stomach area. °· You have a lasting, often uncomfortable and painful erection of the penis (priapism). If it is not treated right away, you will become unable to have sex (impotence). °· You have numbness in your arms or legs or you have a hard time moving them. °· You have a hard time talking. °· You have a fever or lasting symptoms for more than 2-3 days. °· You have a fever and your symptoms suddenly get  worse. °· You have signs or symptoms of infection. These include: °? Chills. °? Being more tired than normal (lethargy). °? Irritability. °? Poor eating. °? Throwing up (vomiting). °· You have pain that is not helped with medicine. °· You have shortness of breath. °· You have pain in your chest. °· You are coughing up pus-like or bloody mucus. °· You have a stiff neck. °· Your feet or hands swell or have pain. °· Your belly looks bloated. °· Your joints hurt. °This information is not intended to replace advice given to you by your health care provider. Make sure you discuss any questions you have with your health care provider. °Document Released: 07/03/2013 Document Revised: 02/18/2016 Document Reviewed: 04/24/2013 °Elsevier Interactive Patient Education © 2017 [employer] Inc[employer] ° °
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
## 2018-05-05 ENCOUNTER — Telehealth: Payer: Self-pay | Admitting: Family Medicine

## 2018-05-05 LAB — COMPREHENSIVE METABOLIC PANEL
ALT: 23 IU/L (ref 0–44)
AST: 55 IU/L — ABNORMAL HIGH (ref 0–40)
Albumin/Globulin Ratio: 1.3 (ref 1.2–2.2)
Albumin: 4 g/dL (ref 3.5–5.5)
Alkaline Phosphatase: 108 IU/L (ref 39–117)
BUN/Creatinine Ratio: 10 (ref 9–20)
BUN: 15 mg/dL (ref 6–24)
Bilirubin Total: 3.4 mg/dL — ABNORMAL HIGH (ref 0.0–1.2)
CO2: 17 mmol/L — ABNORMAL LOW (ref 20–29)
Calcium: 9.4 mg/dL (ref 8.7–10.2)
Chloride: 107 mmol/L — ABNORMAL HIGH (ref 96–106)
Creatinine, Ser: 1.54 mg/dL — ABNORMAL HIGH (ref 0.76–1.27)
GFR calc Af Amer: 64 mL/min/{1.73_m2} (ref >59)
GFR calc non Af Amer: 55 mL/min/{1.73_m2} — ABNORMAL LOW (ref >59)
Globulin, Total: 3.1 g/dL (ref 1.5–4.5)
Glucose: 92 mg/dL (ref 65–99)
Potassium: 4.3 mmol/L (ref 3.5–5.2)
Sodium: 138 mmol/L (ref 134–144)
Total Protein: 7.1 g/dL (ref 6.0–8.5)

## 2018-05-05 LAB — CBC WITH DIFFERENTIAL
Basophils Absolute: 0.1 10*3/uL (ref 0.0–0.2)
Basos: 1 %
EOS (ABSOLUTE): 0.3 10*3/uL (ref 0.0–0.4)
Eos: 4 %
Hematocrit: 16.2 % — CL (ref 37.5–51.0)
Hemoglobin: 5.6 g/dL — CL (ref 13.0–17.7)
Immature Grans (Abs): 0.1 10*3/uL (ref 0.0–0.1)
Immature Granulocytes: 2 %
Lymphocytes Absolute: 2.2 10*3/uL (ref 0.7–3.1)
Lymphs: 31 %
MCH: 34.6 pg — ABNORMAL HIGH (ref 26.6–33.0)
MCHC: 34.6 g/dL (ref 31.5–35.7)
MCV: 100 fL — ABNORMAL HIGH (ref 79–97)
Monocytes Absolute: 0.4 10*3/uL (ref 0.1–0.9)
Monocytes: 6 %
NRBC: 7 % — ABNORMAL HIGH (ref 0–0)
Neutrophils Absolute: 3.9 10*3/uL (ref 1.4–7.0)
Neutrophils: 56 %
RBC: 1.62 x10E6/uL — CL (ref 4.14–5.80)
RDW: 21.2 % — ABNORMAL HIGH (ref 12.3–15.4)
WBC: 7 10*3/uL (ref 3.4–10.8)

## 2018-05-05 LAB — VITAMIN D 25 HYDROXY (VIT D DEFICIENCY, FRACTURES): Vit D, 25-Hydroxy: 31.9 ng/mL (ref 30.0–100.0)

## 2018-05-05 NOTE — Telephone Encounter (Signed)
Received called from [facility], reporting critical lab values of decreased lab values of: RBC of 1.62, Hct of 16.2, and Hgb of 5.6.   Patient contacted and assessed for acute distress and Sickle Cell Crisis. He reports mild back pain, and states that he has noticed mild shortness of breath. He denies weakness, dizziness, increased fatigue, lightheadedness, headache, pallor, and heart palpitations. He continues to take pain medications and remain hydrated as prescribed.   Patient is advised to report to ED immediately for blood transfusion and further evaluation. His last blood transfusion was on 11/12/2017, with Hgb of 6.5.   Patient states that he will report to ED tomorrow. He is advised to report to ED immediately if symptoms worsen. Patient verbalized understanding.    Shawn IpNatalie [name],  MSN, FNP-C Patient Care [facility] [address]  [place], [place] [address] [phone]

## 2018-05-07 ENCOUNTER — Telehealth: Payer: Self-pay | Admitting: Family Medicine

## 2018-05-07 ENCOUNTER — Ambulatory Visit: Payer: Medicare Other | Admitting: Family Medicine

## 2018-05-07 DIAGNOSIS — D582 Other hemoglobinopathies: Secondary | ICD-10-CM

## 2018-05-07 NOTE — Telephone Encounter (Signed)
Patient states that he did not go to the ED for further evaluation as instructed due to hemoglobin of 5.6. Patient states that he took oxycodone due to having pain due to crisis. Patient denies pain and SOB at the present time. He would like to come to the day clinic in the AM for repeat CBC. Advised to go to ED is he does develop SOB, chest pain, dizziness or any other symptoms. Patient verbalized understanding and agreed with plan of care.

## 2018-05-08 ENCOUNTER — Other Ambulatory Visit: Payer: Medicare Other

## 2018-05-08 ENCOUNTER — Telehealth: Payer: Self-pay

## 2018-05-08 DIAGNOSIS — D582 Other hemoglobinopathies: Secondary | ICD-10-CM | POA: Diagnosis not present

## 2018-05-08 LAB — CBC WITH DIFFERENTIAL/PLATELET
Basophils Absolute: 0.3 10*3/uL — ABNORMAL HIGH (ref 0.0–0.2)
Basos: 4 %
EOS (ABSOLUTE): 0.1 10*3/uL (ref 0.0–0.4)
Eos: 1 %
Hematocrit: 17 % — CL (ref 37.5–51.0)
Hemoglobin: 6.2 g/dL — CL (ref 13.0–17.7)
Lymphocytes Absolute: 3.1 10*3/uL (ref 0.7–3.1)
Lymphs: 41 %
MCH: 35.6 pg — ABNORMAL HIGH (ref 26.6–33.0)
MCHC: 36.5 g/dL — ABNORMAL HIGH (ref 31.5–35.7)
MCV: 98 fL — ABNORMAL HIGH (ref 79–97)
Monocytes Absolute: 0.6 10*3/uL (ref 0.1–0.9)
Monocytes: 7 %
Neutrophils Absolute: 3.4 10*3/uL (ref 1.4–7.0)
Neutrophils: 47 %
Platelets: 431 10*3/uL (ref 150–450)
RBC: 1.74 x10E6/uL — CL (ref 4.14–5.80)
RDW: 23.5 % — ABNORMAL HIGH (ref 12.3–15.4)
WBC: 7.5 10*3/uL (ref 3.4–10.8)

## 2018-05-08 NOTE — Telephone Encounter (Signed)
Spoke with Raliegh IpNatalie Stroud, NP regarding stat lab results. Patient is improving slightly and she advised to have patient come in on Friday 05/11/2018 to have repeat lab draw for CBC or come in sooner if he has signs or symptoms of low hgb. I called, no answer. I have left a message for patient to call back and scheduled an appointment for 05/11/2018 for re-draw. Thanks!

## 2018-05-10 DIAGNOSIS — J329 Chronic sinusitis, unspecified: Secondary | ICD-10-CM | POA: Diagnosis not present

## 2018-05-10 DIAGNOSIS — J0100 Acute maxillary sinusitis, unspecified: Secondary | ICD-10-CM | POA: Diagnosis not present

## 2018-05-10 DIAGNOSIS — J4540 Moderate persistent asthma, uncomplicated: Secondary | ICD-10-CM | POA: Diagnosis not present

## 2018-05-10 DIAGNOSIS — Z87891 Personal history of nicotine dependence: Secondary | ICD-10-CM | POA: Diagnosis not present

## 2018-05-10 DIAGNOSIS — Z8709 Personal history of other diseases of the respiratory system: Secondary | ICD-10-CM | POA: Diagnosis not present

## 2018-05-10 DIAGNOSIS — D869 Sarcoidosis, unspecified: Secondary | ICD-10-CM | POA: Diagnosis not present

## 2018-05-10 DIAGNOSIS — D571 Sickle-cell disease without crisis: Secondary | ICD-10-CM | POA: Diagnosis not present

## 2018-06-21 DIAGNOSIS — D649 Anemia, unspecified: Secondary | ICD-10-CM | POA: Diagnosis not present

## 2018-06-21 DIAGNOSIS — N183 Chronic kidney disease, stage 3 (moderate): Secondary | ICD-10-CM | POA: Diagnosis not present

## 2018-06-21 DIAGNOSIS — Z23 Encounter for immunization: Secondary | ICD-10-CM | POA: Diagnosis not present

## 2018-06-21 DIAGNOSIS — E8809 Other disorders of plasma-protein metabolism, not elsewhere classified: Secondary | ICD-10-CM | POA: Diagnosis not present

## 2018-06-25 DIAGNOSIS — H40003 Preglaucoma, unspecified, bilateral: Secondary | ICD-10-CM | POA: Diagnosis not present

## 2018-06-25 DIAGNOSIS — H40013 Open angle with borderline findings, low risk, bilateral: Secondary | ICD-10-CM | POA: Diagnosis not present

## 2018-06-25 DIAGNOSIS — D571 Sickle-cell disease without crisis: Secondary | ICD-10-CM | POA: Diagnosis not present

## 2018-06-25 DIAGNOSIS — H5213 Myopia, bilateral: Secondary | ICD-10-CM | POA: Diagnosis not present

## 2018-06-25 DIAGNOSIS — H527 Unspecified disorder of refraction: Secondary | ICD-10-CM | POA: Diagnosis not present

## 2018-06-25 DIAGNOSIS — H04123 Dry eye syndrome of bilateral lacrimal glands: Secondary | ICD-10-CM | POA: Diagnosis not present

## 2018-06-25 DIAGNOSIS — H52201 Unspecified astigmatism, right eye: Secondary | ICD-10-CM | POA: Diagnosis not present

## 2018-07-06 ENCOUNTER — Encounter (HOSPITAL_COMMUNITY): Payer: Self-pay

## 2018-07-06 ENCOUNTER — Other Ambulatory Visit: Payer: Self-pay

## 2018-07-06 ENCOUNTER — Inpatient Hospital Stay (HOSPITAL_COMMUNITY)
Admission: EM | Admit: 2018-07-06 | Discharge: 2018-07-09 | DRG: 812 | Disposition: A | Discharge status: Home / Self Care | Payer: Medicare Other | Attending: Internal Medicine | Admitting: Internal Medicine

## 2018-07-06 DIAGNOSIS — D638 Anemia in other chronic diseases classified elsewhere: Secondary | ICD-10-CM

## 2018-07-06 DIAGNOSIS — Z8249 Family history of ischemic heart disease and other diseases of the circulatory system: Secondary | ICD-10-CM | POA: Diagnosis not present

## 2018-07-06 DIAGNOSIS — G894 Chronic pain syndrome: Secondary | ICD-10-CM | POA: Diagnosis not present

## 2018-07-06 DIAGNOSIS — D869 Sarcoidosis, unspecified: Secondary | ICD-10-CM | POA: Diagnosis not present

## 2018-07-06 DIAGNOSIS — J45909 Unspecified asthma, uncomplicated: Secondary | ICD-10-CM | POA: Diagnosis present

## 2018-07-06 DIAGNOSIS — D5700 Hb-SS disease with crisis, unspecified: Secondary | ICD-10-CM | POA: Diagnosis not present

## 2018-07-06 DIAGNOSIS — Z833 Family history of diabetes mellitus: Secondary | ICD-10-CM

## 2018-07-06 DIAGNOSIS — Z87891 Personal history of nicotine dependence: Secondary | ICD-10-CM | POA: Diagnosis not present

## 2018-07-06 DIAGNOSIS — N183 Chronic kidney disease, stage 3 unspecified: Secondary | ICD-10-CM

## 2018-07-06 DIAGNOSIS — D631 Anemia in chronic kidney disease: Secondary | ICD-10-CM | POA: Diagnosis present

## 2018-07-06 DIAGNOSIS — N182 Chronic kidney disease, stage 2 (mild): Secondary | ICD-10-CM | POA: Diagnosis present

## 2018-07-06 DIAGNOSIS — Z832 Family history of diseases of the blood and blood-forming organs and certain disorders involving the immune mechanism: Secondary | ICD-10-CM

## 2018-07-06 DIAGNOSIS — J4540 Moderate persistent asthma, uncomplicated: Secondary | ICD-10-CM | POA: Diagnosis present

## 2018-07-06 DIAGNOSIS — D860 Sarcoidosis of lung: Secondary | ICD-10-CM | POA: Diagnosis present

## 2018-07-06 DIAGNOSIS — R011 Cardiac murmur, unspecified: Secondary | ICD-10-CM | POA: Diagnosis present

## 2018-07-06 DIAGNOSIS — E559 Vitamin D deficiency, unspecified: Secondary | ICD-10-CM | POA: Diagnosis present

## 2018-07-06 DIAGNOSIS — N179 Acute kidney failure, unspecified: Secondary | ICD-10-CM | POA: Diagnosis present

## 2018-07-06 DIAGNOSIS — N1832 Chronic kidney disease, stage 3b: Secondary | ICD-10-CM | POA: Diagnosis present

## 2018-07-06 LAB — CBC WITH DIFFERENTIAL/PLATELET
Basophils Absolute: 0.3 10*3/uL — ABNORMAL HIGH (ref 0.0–0.1)
Basophils Relative: 4 %
EOS PCT: 4 %
Eosinophils Absolute: 0.3 10*3/uL (ref 0.0–0.5)
HCT: 15 % — ABNORMAL LOW (ref 39.0–52.0)
HEMOGLOBIN: 5.6 g/dL — AB (ref 13.0–17.0)
LYMPHS ABS: 2.7 10*3/uL (ref 0.7–4.0)
Lymphocytes Relative: 38 %
MCH: 39.4 pg — ABNORMAL HIGH (ref 26.0–34.0)
MCHC: 37.3 g/dL — ABNORMAL HIGH (ref 30.0–36.0)
MCV: 105.6 fL — AB (ref 80.0–100.0)
MONO ABS: 0.4 10*3/uL (ref 0.1–1.0)
MONOS PCT: 6 %
NEUTROS ABS: 3.4 10*3/uL (ref 1.7–7.7)
Neutrophils Relative %: 48 %
Platelets: 358 10*3/uL (ref 150–400)
RBC: 1.42 MIL/uL — ABNORMAL LOW (ref 4.22–5.81)
RDW: 22.1 % — ABNORMAL HIGH (ref 11.5–15.5)
WBC: 7.1 10*3/uL (ref 4.0–10.5)

## 2018-07-06 LAB — RETICULOCYTES
IMMATURE RETIC FRACT: 33.8 % — AB (ref 2.3–15.9)
RBC.: 1.42 MIL/uL — AB (ref 4.22–5.81)
RETIC COUNT ABSOLUTE: 192.3 10*3/uL — AB (ref 19.0–186.0)
Retic Ct Pct: 13.5 % — ABNORMAL HIGH (ref 0.4–3.1)

## 2018-07-06 LAB — COMPREHENSIVE METABOLIC PANEL
ALBUMIN: 3.9 g/dL (ref 3.5–5.0)
ALT: 19 U/L (ref 0–44)
AST: 50 U/L — AB (ref 15–41)
Alkaline Phosphatase: 109 U/L (ref 38–126)
Anion gap: 9 (ref 5–15)
BILIRUBIN TOTAL: 2.5 mg/dL — AB (ref 0.3–1.2)
BUN: 20 mg/dL (ref 6–20)
CO2: 20 mmol/L — AB (ref 22–32)
Calcium: 9.6 mg/dL (ref 8.9–10.3)
Chloride: 111 mmol/L (ref 98–111)
Creatinine, Ser: 1.49 mg/dL — ABNORMAL HIGH (ref 0.61–1.24)
GFR calc Af Amer: >60 mL/min (ref >60)
GFR calc non Af Amer: 57 mL/min — ABNORMAL LOW (ref >60)
GLUCOSE: 91 mg/dL (ref 70–99)
Potassium: 4.3 mmol/L (ref 3.5–5.1)
SODIUM: 140 mmol/L (ref 135–145)
Total Protein: 7.8 g/dL (ref 6.5–8.1)

## 2018-07-06 LAB — PREPARE RBC (CROSSMATCH)

## 2018-07-06 MED ORDER — HYDROMORPHONE HCL 1 MG/ML IJ SOLN: 0.5000 mg | INTRAMUSCULAR | Status: AC

## 2018-07-06 MED ORDER — HYDROMORPHONE HCL 1 MG/ML IJ SOLN
1.0000 mg | INTRAMUSCULAR | Status: AC
  Administered 2018-07-06: 1 mg via INTRAVENOUS
  Filled 2018-07-06: qty 1

## 2018-07-06 MED ORDER — HYDROMORPHONE HCL 1 MG/ML IJ SOLN: 1.0000 mg | INTRAMUSCULAR | Status: AC

## 2018-07-06 MED ORDER — DIPHENHYDRAMINE HCL 25 MG PO CAPS
25.0000 mg | ORAL_CAPSULE | ORAL | Status: DC | PRN
  Administered 2018-07-07: 25 mg via ORAL
  Filled 2018-07-06: qty 1

## 2018-07-06 MED ORDER — KETOROLAC TROMETHAMINE 30 MG/ML IJ SOLN
30.0000 mg | INTRAMUSCULAR | Status: AC
  Administered 2018-07-06: 30 mg via INTRAVENOUS
  Filled 2018-07-06: qty 1

## 2018-07-06 MED ORDER — CYCLOBENZAPRINE HCL 10 MG PO TABS: 10.0000 mg | ORAL_TABLET | Freq: Three times a day (TID) | ORAL | Status: DC | PRN

## 2018-07-06 MED ORDER — MOMETASONE FURO-FORMOTEROL FUM 200-5 MCG/ACT IN AERO
2.0000 | INHALATION_SPRAY | Freq: Two times a day (BID) | RESPIRATORY_TRACT | Status: DC
  Administered 2018-07-07 – 2018-07-09 (×5): 2 via RESPIRATORY_TRACT
  Filled 2018-07-06 (×2): qty 8.8

## 2018-07-06 MED ORDER — VITAMIN B-12 1000 MCG PO TABS
1000.0000 ug | ORAL_TABLET | Freq: Every day | ORAL | Status: DC
  Administered 2018-07-06 – 2018-07-08 (×3): 1000 ug via ORAL
  Filled 2018-07-06 (×3): qty 1

## 2018-07-06 MED ORDER — HYDROMORPHONE HCL 1 MG/ML IJ SOLN
0.5000 mg | INTRAMUSCULAR | Status: AC
  Administered 2018-07-06: 0.5 mg via INTRAVENOUS
  Filled 2018-07-06: qty 1

## 2018-07-06 MED ORDER — SODIUM CHLORIDE 0.9% FLUSH: 9.0000 mL | INTRAVENOUS | Status: DC | PRN

## 2018-07-06 MED ORDER — DIPHENHYDRAMINE HCL 50 MG/ML IJ SOLN
25.0000 mg | Freq: Once | INTRAMUSCULAR | Status: AC
  Administered 2018-07-06: 25 mg via INTRAVENOUS
  Filled 2018-07-06: qty 1

## 2018-07-06 MED ORDER — KETOROLAC TROMETHAMINE 15 MG/ML IJ SOLN
15.0000 mg | Freq: Four times a day (QID) | INTRAMUSCULAR | Status: DC
  Administered 2018-07-06 – 2018-07-09 (×9): 15 mg via INTRAVENOUS
  Filled 2018-07-06 (×9): qty 1

## 2018-07-06 MED ORDER — HYDROMORPHONE 1 MG/ML IV SOLN
INTRAVENOUS | Status: DC
  Administered 2018-07-06: via INTRAVENOUS
  Administered 2018-07-07: 0.6 mg via INTRAVENOUS
  Administered 2018-07-07: 1.2 mg via INTRAVENOUS
  Administered 2018-07-07: 0 mg via INTRAVENOUS
  Administered 2018-07-07: 0.6 mg via INTRAVENOUS
  Administered 2018-07-08: 1.2 mg via INTRAVENOUS
  Administered 2018-07-08: 0 mg via INTRAVENOUS
  Administered 2018-07-08: 1.8 mg via INTRAVENOUS
  Administered 2018-07-08 (×3): 0.6 mg via INTRAVENOUS
  Administered 2018-07-09: 2.4 mg via INTRAVENOUS
  Administered 2018-07-09: 0 mg via INTRAVENOUS
  Administered 2018-07-09 (×2): 0.6 mg via INTRAVENOUS
  Filled 2018-07-06: qty 25

## 2018-07-06 MED ORDER — FOLIC ACID 1 MG PO TABS
1.0000 mg | ORAL_TABLET | Freq: Every day | ORAL | Status: DC
  Administered 2018-07-06 – 2018-07-08 (×3): 1 mg via ORAL
  Filled 2018-07-06 (×3): qty 1

## 2018-07-06 MED ORDER — SENNOSIDES-DOCUSATE SODIUM 8.6-50 MG PO TABS
1.0000 | ORAL_TABLET | Freq: Two times a day (BID) | ORAL | Status: DC
  Administered 2018-07-06 – 2018-07-09 (×6): 1 via ORAL
  Filled 2018-07-06 (×6): qty 1

## 2018-07-06 MED ORDER — NALOXONE HCL 0.4 MG/ML IJ SOLN: 0.4000 mg | INTRAMUSCULAR | Status: DC | PRN

## 2018-07-06 MED ORDER — ALBUTEROL SULFATE (2.5 MG/3ML) 0.083% IN NEBU: 2.5000 mg | INHALATION_SOLUTION | RESPIRATORY_TRACT | Status: DC | PRN

## 2018-07-06 MED ORDER — SODIUM CHLORIDE 0.45 % IV SOLN
INTRAVENOUS | Status: AC
  Administered 2018-07-06: via INTRAVENOUS

## 2018-07-06 MED ORDER — ONDANSETRON HCL 4 MG/2ML IJ SOLN
4.0000 mg | Freq: Once | INTRAMUSCULAR | Status: AC
  Administered 2018-07-06: 4 mg via INTRAVENOUS
  Filled 2018-07-06: qty 2

## 2018-07-06 MED ORDER — SODIUM CHLORIDE 0.9 % IV SOLN
25.0000 mg | INTRAVENOUS | Status: DC | PRN
  Filled 2018-07-06: qty 0.5

## 2018-07-06 MED ORDER — FAMOTIDINE 20 MG PO TABS: 10.0000 mg | ORAL_TABLET | Freq: Every day | ORAL | Status: DC | PRN

## 2018-07-06 MED ORDER — SODIUM CHLORIDE 0.45 % IV SOLN
INTRAVENOUS | Status: DC
  Administered 2018-07-06: 21:00:00 via INTRAVENOUS
  Filled 2018-07-06: qty 1000

## 2018-07-06 MED ORDER — ALBUTEROL SULFATE HFA 108 (90 BASE) MCG/ACT IN AERS: 2.0000 | INHALATION_SPRAY | RESPIRATORY_TRACT | Status: DC | PRN

## 2018-07-06 MED ORDER — ONDANSETRON HCL 4 MG/2ML IJ SOLN: 4.0000 mg | Freq: Four times a day (QID) | INTRAMUSCULAR | Status: DC | PRN

## 2018-07-06 MED ORDER — POLYETHYLENE GLYCOL 3350 17 G PO PACK
17.0000 g | PACK | Freq: Every day | ORAL | Status: DC | PRN
  Administered 2018-07-08: 17 g via ORAL
  Filled 2018-07-06: qty 1

## 2018-07-06 MED ORDER — ENOXAPARIN SODIUM 40 MG/0.4ML ~~LOC~~ SOLN
40.0000 mg | Freq: Every day | SUBCUTANEOUS | Status: DC
  Administered 2018-07-07: 40 mg via SUBCUTANEOUS
  Filled 2018-07-06 (×2): qty 0.4

## 2018-07-06 MED ORDER — MELATONIN 5 MG PO TABS
5.0000 mg | ORAL_TABLET | Freq: Every day | ORAL | Status: DC
  Administered 2018-07-06 – 2018-07-08 (×3): 5 mg via ORAL
  Filled 2018-07-06 (×3): qty 1

## 2018-07-06 NOTE — ED Notes (Signed)
ED TO INPATIENT HANDOFF REPORT  Name/Age/Gender Shawn Adams 42 y.o. male  Code Status    Code Status Orders  (From admission, onward)         Start     Ordered   07/06/18 2236  Full code  Continuous     07/06/18 2238        Code Status History    Date Active Date Inactive Code Status Order ID Comments User Context   05/03/2016 2039 05/04/2016 2239 Full Code [id number]  Jani Gravel, MD Inpatient   12/07/2015 1654 12/10/2015 2336 Full Code [id number]  Leana Gamer, MD Inpatient   03/09/2015 0005 03/09/2015 2209 Full Code [id number]  Quintella Baton, MD Inpatient   12/07/2014 1704 12/09/2014 1427 Full Code [id number]  Louellen Molder, MD Inpatient   06/21/2014 0652 06/23/2014 1730 Full Code [id number]  Robbie Lis, MD Inpatient   05/31/2014 0143 06/01/2014 1546 Full Code [id number]  Etta Quill, DO ED      Home/SNF/Other Home  Chief Complaint Leg Pain/Sickle Cell Patient  Level of Care/Admitting Diagnosis ED Disposition    ED Disposition Condition [facility]: [facility] [[id number]]  Level of Care: Med-Surg [16]  Diagnosis: Sickle cell pain crisis [facility]) [[id number]]  Admitting Physician: Vianne Bulls [[id number]]  Attending Physician: Vianne Bulls [[id number]]  Estimated length of stay: past midnight tomorrow  Certification:: I certify this patient will need inpatient services for at least 2 midnights  PT Class (Do Not Modify): Inpatient [101]  PT Acc Code (Do Not Modify): Private [1]       Medical History Past Medical History:  Diagnosis Date  . Aneurysm ([place])    Pt states happened in 2011, Brain   . Heart murmur   . Sarcoid   . Sickle cell anemia (HCC)     Allergies No Known Allergies  IV Location/Drains/Wounds Patient Lines/Drains/Airways Status   Active Line/Drains/Airways    Name:   Placement date:   Placement time:   Site:   Days:   Peripheral IV 07/06/18 Right Forearm   07/06/18    1945    Forearm   less than 1           Labs/Imaging Results for orders placed or performed during the hospital encounter of 07/06/18 (from the past 48 hour(s))  Comprehensive metabolic panel     Status: Abnormal   Collection Time: 07/06/18  7:24 PM  Result Value Ref Range   Sodium 140 135 - 145 mmol/L   Potassium 4.3 3.5 - 5.1 mmol/L   Chloride 111 98 - 111 mmol/L   CO2 20 (L) 22 - 32 mmol/L   Glucose, Bld 91 70 - 99 mg/dL   BUN 20 6 - 20 mg/dL   Creatinine, Ser 1.49 (H) 0.61 - 1.24 mg/dL   Calcium 9.6 8.9 - 10.3 mg/dL   Total Protein 7.8 6.5 - 8.1 g/dL   Albumin 3.9 3.5 - 5.0 g/dL   AST 50 (H) 15 - 41 U/L   ALT 19 0 - 44 U/L   Alkaline Phosphatase 109 38 - 126 U/L   Total Bilirubin 2.5 (H) 0.3 - 1.2 mg/dL   GFR calc non Af Amer 57 (L) >60 mL/min   GFR calc Af Amer >60 >60 mL/min    Comment: (NOTE) The eGFR has been calculated using the CKD EPI equation. This calculation has not been validated in all clinical situations. eGFR's persistently <60 mL/min signify possible Chronic Kidney Disease.  Anion gap 9 5 - 15    Comment: Performed at [facility], [place] [address], [place], [place] [address]  CBC with Differential     Status: Abnormal   Collection Time: 07/06/18  7:24 PM  Result Value Ref Range   WBC 7.1 4.0 - 10.5 K/uL   RBC 1.42 (L) 4.22 - 5.81 MIL/uL   Hemoglobin 5.6 (LL) 13.0 - 17.0 g/dL    Comment: REPEATED TO VERIFY THIS RESULT HAS BEEN CALLED TO O.NJIHI BY NATHAN THOMPSON ON 10 11 2019 AT 2037, AND HAS BEEN READ BACK. CRITICAL RESULT VERIFIED    HCT 15.0 (L) 39.0 - 52.0 %   MCV 105.6 (H) 80.0 - 100.0 fL   MCH 39.4 (H) 26.0 - 34.0 pg   MCHC 37.3 (H) 30.0 - 36.0 g/dL   RDW 22.1 (H) 11.5 - 15.5 %   Platelets 358 150 - 400 K/uL   Neutrophils Relative % 48 %   Neutro Abs 3.4 1.7 - 7.7 K/uL   Lymphocytes Relative 38 %   Lymphs Abs 2.7 0.7 - 4.0 K/uL   Monocytes Relative 6 %   Monocytes Absolute 0.4 0.1 - 1.0 K/uL   Eosinophils Relative 4 %   Eosinophils Absolute 0.3 0.0  - 0.5 K/uL   Basophils Relative 4 %   Basophils Absolute 0.3 (H) 0.0 - 0.1 K/uL   Tammy Sours Bodies PRESENT    Polychromasia PRESENT    Sickle Cells PRESENT    Target Cells PRESENT     Comment: Performed at [facility], [place] [address], [place], [place] [address]  Reticulocytes     Status: Abnormal   Collection Time: 07/06/18  7:24 PM  Result Value Ref Range   Retic Ct Pct 13.5 (H) 0.4 - 3.1 %   RBC. 1.42 (L) 4.22 - 5.81 MIL/uL   Retic Count, Absolute 192.3 (H) 19.0 - 186.0 K/uL   Immature Retic Fract 33.8 (H) 2.3 - 15.9 %    Comment: Performed at [facility], [place] [address], [place], [place] [address]   No results found. None  Pending Labs Unresulted Labs (From admission, onward)    Start     Ordered   07/13/18 0500  Creatinine, serum  (enoxaparin (LOVENOX)    CrCl >/= 30 ml/min)  Weekly,   R    Comments:  while on enoxaparin therapy    07/06/18 2238   07/06/18 2239  Prepare RBC  (Adult Blood Administration - Red Blood Cells)  Once,   R    Question Answer Comment  # of Units 1 unit   Transfusion Indications Sickle cell crisis   If emergent release call blood bank Not emergent release      07/06/18 2238   07/06/18 2237  HIV antibody (Routine Testing)  Add-on,   R     07/06/18 2238   07/06/18 2220  Type and screen [place]  Once,   STAT    Comments:  [place]    07/06/18 2219          Vitals/Pain Today's Vitals   07/06/18 2008 07/06/18 2102 07/06/18 2121 07/06/18 2127  BP: 101/66  130/70   Pulse: 96  76   Resp: 16  15   Temp:      TempSrc:      SpO2: 93%  90%   Weight:      Height:      PainSc:  7   7  Isolation Precautions No active isolations  Medications Medications  HYDROmorphone (DILAUDID) injection 1 mg (has no administration in time range)    Or  HYDROmorphone (DILAUDID) injection 1 mg (has no administration in time range)  famotidine (PEPCID) tablet 10 mg  (has no administration in time range)  folic acid (FOLVITE) tablet 1 mg (has no administration in time range)  vitamin B-12 (CYANOCOBALAMIN) tablet 1,000 mcg (has no administration in time range)  Melatonin TABS 5 mg (has no administration in time range)  cyclobenzaprine (FLEXERIL) tablet 10 mg (has no administration in time range)  mometasone-formoterol (DULERA) 200-5 MCG/ACT inhaler 2 puff (has no administration in time range)  senna-docusate (Senokot-S) tablet 1 tablet (has no administration in time range)  polyethylene glycol (MIRALAX / GLYCOLAX) packet 17 g (has no administration in time range)  naloxone (NARCAN) injection 0.4 mg (has no administration in time range)    And  sodium chloride flush (NS) 0.9 % injection 9 mL (has no administration in time range)  ondansetron (ZOFRAN) injection 4 mg (has no administration in time range)  diphenhydrAMINE (BENADRYL) capsule 25 mg (has no administration in time range)    Or  diphenhydrAMINE (BENADRYL) 25 mg in sodium chloride 0.9 % 50 mL IVPB (has no administration in time range)  enoxaparin (LOVENOX) injection 40 mg (has no administration in time range)  0.45 % sodium chloride infusion (has no administration in time range)  ketorolac (TORADOL) 15 MG/ML injection 15 mg (has no administration in time range)  HYDROmorphone (DILAUDID) 1 mg/mL PCA injection (has no administration in time range)  albuterol (PROVENTIL) (2.5 MG/3ML) 0.083% nebulizer solution 2.5 mg (has no administration in time range)  ketorolac (TORADOL) 30 MG/ML injection 30 mg (30 mg Intravenous Given 07/06/18 1951)  HYDROmorphone (DILAUDID) injection 0.5 mg (0.5 mg Intravenous Given 07/06/18 1951)    Or  HYDROmorphone (DILAUDID) injection 0.5 mg ( Subcutaneous See Alternative 07/06/18 1951)  HYDROmorphone (DILAUDID) injection 1 mg (1 mg Intravenous Given 07/06/18 2043)    Or  HYDROmorphone (DILAUDID) injection 1 mg ( Subcutaneous See Alternative 07/06/18 2043)  HYDROmorphone  (DILAUDID) injection 1 mg (1 mg Intravenous Given 07/06/18 2123)    Or  HYDROmorphone (DILAUDID) injection 1 mg ( Subcutaneous See Alternative 07/06/18 2123)  diphenhydrAMINE (BENADRYL) injection 25 mg (25 mg Intravenous Given 07/06/18 2123)  ondansetron (ZOFRAN) injection 4 mg (4 mg Intravenous Given 07/06/18 2122)    Mobility walks

## 2018-07-06 NOTE — H&P (Signed)
History and Physical    Shawn Adams WUJ:[id number] DOB: 12-21-1975 DOA: 07/06/2018  PCP: Shawn Gip, FNP   Patient coming from: Home   Chief Complaint: Severe pain in hips and knees   HPI: Shawn Adams is a 42 y.o. male with medical history significant for sickle cell anemia, asthma, sarcoidosis with pulmonary involvement, and chronic pain, now presenting to the emergency department for evaluation of severe pain in bilateral hips and knees.  Patient reports that pain developed insidiously several days ago, he was initially able to manage it with his home medications, but it has continued to worsen and has become severe despite using his medications at home.  He denies any fevers, chills, or chest pain.  He reports recent increase in his dyspnea with occasional wheezing.  Denies any melena or hematochezia.  ED Course: Upon arrival to the ED, patient is found to be afebrile, saturating 90% on room air, and with vitals otherwise normal.  Chemistry panel features a creatinine 1.49, consistent with his apparent baseline.  CBC is notable for hemoglobin of 5.6, down from 6.2 in August.  Type and screen was performed in the ED, IV fluids were administered, patient was treated with Toradol, and given multiple doses of IV Dilaudid.  He reports some improvement with this, but continues to complain of severe pain and will be admitted for ongoing evaluation and management of sickle cell pain crisis.   Review of Systems:  All other systems reviewed and apart from HPI, are negative.  Past Medical History:  Diagnosis Date  . Aneurysm (HCC)    Pt states happened in 2011, Brain   . Heart murmur   . Sarcoid   . Sickle cell anemia (HCC)     Past Surgical History:  Procedure Laterality Date  . LYMPH NODE BIOPSY       reports that he has quit smoking. His smoking use included cigarettes. He has never used smokeless tobacco. He reports that he drinks about 1.0 standard drinks of alcohol per week. He  reports that he has current or past drug history. Drug: Marijuana.  No Known Allergies  Family History  Problem Relation Age of Onset  . Diabetes Mother   . Hypertension Father   . Lupus Sister      Prior to Admission medications   Medication Sig Start Date End Date Taking? Authorizing Provider  acetaminophen (TYLENOL) 500 MG tablet Take 1,000-1,500 mg by mouth every 6 (six) hours as needed for moderate pain or fever (pain).    Yes [provider]  albuterol (PROVENTIL HFA;VENTOLIN HFA) 108 (90 Base) MCG/ACT inhaler Inhale 2 puffs into the lungs every 4 (four) hours as needed for wheezing or shortness of breath (cough, shortness of breath or wheezing.). Patient taking differently: Inhale 2 puffs into the lungs every 4 (four) hours as needed for wheezing or shortness of breath (cough).  09/21/17  Yes Massie Maroon, FNP  Ascorbic Acid (VITAMIN C) 1000 MG tablet Take 1,000 mg by mouth daily.    Yes [provider]  budesonide-formoterol (SYMBICORT) 160-4.5 MCG/ACT inhaler Inhale 2 puffs into the lungs 2 (two) times daily. 05/10/18 05/10/19 Yes [provider]  Cholecalciferol (VITAMIN D PO) Take 1 tablet by mouth every Monday.   Yes [provider]  cyclobenzaprine (FLEXERIL) 10 MG tablet Take 1 tablet (10 mg total) by mouth 3 (three) times daily as needed for muscle spasms. 04/09/18  Yes Street, [place], PA-C  famotidine (PEPCID) 10 MG tablet Take 10 mg by mouth  daily as needed for heartburn or indigestion.   Yes [provider]  folic acid (FOLVITE) 1 MG tablet Take 1 tablet (1 mg total) by mouth daily. Patient taking differently: Take 1 mg by mouth at bedtime.  02/09/16  Yes Quentin Angst, MD  hydroxyurea (HYDREA) 500 MG capsule Take 2 capsules (1,000 mg total) by mouth daily. May take with food to minimize GI side effects. Patient taking differently: Take 1,000 mg by mouth at bedtime. May take with food to minimize GI side effects.  02/09/16  Yes Quentin Angst, MD  MELATONIN PO Take by mouth at bedtime.    Yes [provider]  naproxen (NAPROSYN) 500 MG tablet Take 1 tablet (500 mg total) by mouth 2 (two) times daily as needed for mild pain, moderate pain or headache (TAKE WITH MEALS.). 04/09/18  Yes Street, [place], PA-C  ondansetron (ZOFRAN) 4 MG tablet Take 1 tablet (4 mg total) by mouth every 6 (six) hours. Patient taking differently: Take 4 mg by mouth every 6 (six) hours as needed for nausea or vomiting.  12/21/15  Yes Hedges, Tinnie Gens, PA-C  Oxycodone HCl 20 MG TABS Take 20 mg by mouth every 4 (four) hours. scheduled   Yes [provider]  vitamin B-12 (CYANOCOBALAMIN) 1000 MCG tablet Take 1,000 mcg by mouth at bedtime.    Yes [provider]    Physical Exam: Vitals:   07/06/18 1915 07/06/18 1924 07/06/18 2008 07/06/18 2121  BP:   101/66 130/70  Pulse: 77 80 96 76  Resp:   16 15  Temp:      TempSrc:      SpO2: 95% 96% 93% 90%  Weight:      Height:        Constitutional: NAD, appears uncomfortable Eyes: PERTLA, lids and conjunctivae normal ENMT: Mucous membranes are moist. Posterior pharynx clear of any exudate or lesions.   Neck: normal, supple, no masses, no thyromegaly Respiratory: Occasional expiratory wheeze, no crackles. Normal respiratory effort.    Cardiovascular: S1 & S2 heard, regular rate and rhythm. No extremity edema.  Abdomen: No distension, no tenderness, soft. Bowel sounds normal.  Musculoskeletal: no clubbing / cyanosis. No joint deformity upper and lower extremities. Normal muscle tone.  Skin: no significant rashes, lesions, ulcers. Warm, dry, well-perfused. Neurologic: CN 2-12 grossly intact. Sensation intact. Strength 5/5 in all 4 limbs.  Psychiatric: Alert and oriented x 3. Pleasant and cooperative.    Labs on Admission: I have personally reviewed following labs and imaging studies  CBC: Recent Labs  Lab 07/06/18 1924  WBC 7.1  NEUTROABS 3.4    HGB 5.6*  HCT 15.0*  MCV 105.6*  PLT 358   Basic Metabolic Panel: Recent Labs  Lab 07/06/18 1924  NA 140  K 4.3  CL 111  CO2 20*  GLUCOSE 91  BUN 20  CREATININE 1.49*  CALCIUM 9.6   GFR: Estimated Creatinine Clearance: 56.5 mL/min (A) (by C-G formula based on SCr of 1.49 mg/dL (H)). Liver Function Tests: Recent Labs  Lab 07/06/18 1924  AST 50*  ALT 19  ALKPHOS 109  BILITOT 2.5*  PROT 7.8  ALBUMIN 3.9   No results for input(s): LIPASE, AMYLASE in the last 168 hours. No results for input(s): AMMONIA in the last 168 hours. Coagulation Profile: No results for input(s): INR, PROTIME in the last 168 hours. Cardiac Enzymes: No results for input(s): CKTOTAL, CKMB, CKMBINDEX, TROPONINI in the last 168 hours. BNP (last 3 results) No results for input(s):  PROBNP in the last 8760 hours. HbA1C: No results for input(s): HGBA1C in the last 72 hours. CBG: No results for input(s): GLUCAP in the last 168 hours. Lipid Profile: No results for input(s): CHOL, HDL, LDLCALC, TRIG, CHOLHDL, LDLDIRECT in the last 72 hours. Thyroid Function Tests: No results for input(s): TSH, T4TOTAL, FREET4, T3FREE, THYROIDAB in the last 72 hours. Anemia Panel: Recent Labs    07/06/18 1924  RETICCTPCT 13.5*   Urine analysis:    Component Value Date/Time   COLORURINE YELLOW 05/04/2016 1805   APPEARANCEUR Clear 10/05/2017 1600   LABSPEC 1.010 12/08/2017 1339   PHURINE 6.0 12/08/2017 1339   GLUCOSEU NEGATIVE 12/08/2017 1339   HGBUR MODERATE (A) 12/08/2017 1339   BILIRUBINUR NEGATIVE 12/08/2017 1339   BILIRUBINUR Negative 10/05/2017 1600   KETONESUR NEGATIVE 12/08/2017 1339   PROTEINUR 100 (A) 12/08/2017 1339   UROBILINOGEN 0.2 12/08/2017 1339   NITRITE NEGATIVE 12/08/2017 1339   LEUKOCYTESUR NEGATIVE 12/08/2017 1339   LEUKOCYTESUR Negative 10/05/2017 1600   Sepsis Labs: @LABRCNTIP (procalcitonin:4,lacticidven:4) )No results found for this or any previous visit (from the past 240  hour(s)).   Radiological Exams on Admission: No results found.  EKG: Not performed.   Assessment/Plan   1. Sickle cell anemia with pain crisis  - Presents with pain in hips and knees similar to prior pain crises  - Reports some improvement after Toradol, IVF, and multiple doses IV Dilaudid in ED, but continues to complain of severe pain  - Continue IVF hydration, continue Toradol injections, hold hydrea, continue folate, and start wt-based Dilaudid PCA    2. Anemia  - Hgb is 5.6 on admission; was 5.6-6.2 in August - He denies any bleeding, is not tachycardic, reports recent increase in SOB but suspects d/t asthma  - Type and screen, prepare 1 unit, repeat H&H in am    3. Asthma  - Patient reports recent increase in SOB, possibly d/t anemia, but he also has some wheezing, though no increased WOB or significant wheezing noted in ED  - Continue ICS/LABA and as-needed albuterol   4. CKD III  - SCr is 1.49 on admission, consistent with his apparent baseline  - Renally-dose medications, avoid dehydration    5. Sarcoidosis  - Follows with pulmonology at [facility] for sarcoidosis with lung involvement  - Pt reports has been stable, not currently on therapy    DVT prophylaxis: Lovenox  Code Status: Full  Family Communication: Discussed with patient  Consults called: None Admission status: Inpatient     Briscoe Deutscher, MD [facility] Pager [phone]  If 7PM-7AM, please contact night-coverage [email or web address] Password [email or web address]  07/06/2018, 10:39 PM

## 2018-07-06 NOTE — ED Triage Notes (Signed)
Patient c/o sickle cell pain right leg x 1 week and left leg pain since yesterday. Patient denies any SOB or chest pain.

## 2018-07-06 NOTE — ED Provider Notes (Signed)
[facility]-EMERGENCY DEPT Provider Note   CSN: [id number] Arrival date & time: 07/06/18  1656     History   Chief Complaint Chief Complaint  Patient presents with  . Sickle Cell Pain Crisis    HPI Shawn Adams is a 42 y.o. male with history of sickle cell anemia, CKD, heart murmur, aneurysm, sarcoidosis presents for evaluation of acute onset, progressively worsening right knee pain for 1 week, right hip pain for 5 days, and left knee pain for 2 days.  Pain is constant, achy and throbbing, localized to the joints and does not radiate.  He has tried gentle stretching exercises, ibuprofen and Tylenol without relief of symptoms.  States he ran out of his home oxycodone 2 days ago.  [place] controlled substance registry was queried which showed that he last filled 120 tablets of oxycodone 20 mg on 06/14/2018 (a one month supply), meaning he is due for a refill on 06/12/2018 and 5 days.  He denies chest pain, shortness of breath, fevers, chills, abdominal pain, nausea, vomiting, numbness, or weakness.  States pain is consistent with his usual sickle cell pain crises.  The history is provided by the patient.    Past Medical History:  Diagnosis Date  . Aneurysm (HCC)    Pt states happened in 2011, Brain   . Heart murmur   . Sarcoid   . Sickle cell anemia [facility])     Patient Active Problem List   Diagnosis Date Noted  . Sarcoidosis 07/06/2018  . Persistent asthma without complication 07/06/2018  . Headache 05/03/2016  . Anemia 05/03/2016  . Headache disorder   . Vitamin D deficiency 02/09/2016  . Heart murmur 02/09/2016  . Recurrent occipital headache 10/15/2015  . History of aneurysm 10/13/2015  . Left hip pain 07/30/2015  . Left ankle pain 07/08/2015  . Sickle cell crisis (HCC) 03/08/2015  . 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 Sickle cell anemia with crisis (HCC) 12/07/2014  . Sickle cell anemia (HCC) 06/20/2014  . CKD (chronic kidney  disease) stage 3, GFR 30-59 ml/min (HCC) 06/20/2014  . Leukocytosis 06/20/2014  . Sickle cell pain crisis (HCC) 06/20/2014  . Hypercalcemia 06/20/2014  . Symptomatic anemia 05/31/2014    Past Surgical History:  Procedure Laterality Date  . LYMPH NODE BIOPSY          Home Medications    Prior to Admission medications   Medication Sig Start Date End Date Taking? Authorizing Provider  acetaminophen (TYLENOL) 500 MG tablet Take 1,000-1,500 mg by mouth every 6 (six) hours as needed for moderate pain or fever (pain).    Yes [provider]  albuterol (PROVENTIL HFA;VENTOLIN HFA) 108 (90 Base) MCG/ACT inhaler Inhale 2 puffs into the lungs every 4 (four) hours as needed for wheezing or shortness of breath (cough, shortness of breath or wheezing.). Patient taking differently: Inhale 2 puffs into the lungs every 4 (four) hours as needed for wheezing or shortness of breath (cough).  09/21/17  Yes Massie Maroon, FNP  Ascorbic Acid (VITAMIN C) 1000 MG tablet Take 1,000 mg by mouth daily.    Yes [provider]  budesonide-formoterol (SYMBICORT) 160-4.5 MCG/ACT inhaler Inhale 2 puffs into the lungs 2 (two) times daily. 05/10/18 05/10/19 Yes [provider]  Cholecalciferol (VITAMIN D PO) Take 1 tablet by mouth every Monday.   Yes [provider]  cyclobenzaprine (FLEXERIL) 10 MG tablet Take 1 tablet (10 mg total) by mouth 3 (three) times daily as needed for  muscle spasms. 04/09/18  Yes Street, [place], PA-C  famotidine (PEPCID) 10 MG tablet Take 10 mg by mouth daily as needed for heartburn or indigestion.   Yes [provider]  folic acid (FOLVITE) 1 MG tablet Take 1 tablet (1 mg total) by mouth daily. Patient taking differently: Take 1 mg by mouth at bedtime.  02/09/16  Yes Quentin Angst, MD  hydroxyurea (HYDREA) 500 MG capsule Take 2 capsules (1,000 mg total) by mouth daily. May take with food to minimize GI side effects. Patient taking  differently: Take 1,000 mg by mouth at bedtime. May take with food to minimize GI side effects. 02/09/16  Yes Quentin Angst, MD  MELATONIN PO Take by mouth at bedtime.    Yes [provider]  naproxen (NAPROSYN) 500 MG tablet Take 1 tablet (500 mg total) by mouth 2 (two) times daily as needed for mild pain, moderate pain or headache (TAKE WITH MEALS.). 04/09/18  Yes Street, [place], PA-C  ondansetron (ZOFRAN) 4 MG tablet Take 1 tablet (4 mg total) by mouth every 6 (six) hours. Patient taking differently: Take 4 mg by mouth every 6 (six) hours as needed for nausea or vomiting.  12/21/15  Yes Hedges, Tinnie Gens, PA-C  Oxycodone HCl 20 MG TABS Take 20 mg by mouth every 4 (four) hours. scheduled   Yes [provider]  vitamin B-12 (CYANOCOBALAMIN) 1000 MCG tablet Take 1,000 mcg by mouth at bedtime.    Yes [provider]    Family History Family History  Problem Relation Age of Onset  . Diabetes Mother   . Hypertension Father   . Lupus Sister     Social History Social History   Tobacco Use  . Smoking status: Former Smoker    Types: Cigarettes  . Smokeless tobacco: Never Used  Substance Use Topics  . Alcohol use: Yes    Alcohol/week: 1.0 standard drinks    Types: 1 Cans of beer per week    Comment: occasionally  . Drug use: Not Currently    Types: Marijuana     Allergies   Patient has no known allergies.   Review of Systems Review of Systems  Constitutional: Negative for chills and fever.  Respiratory: Negative for shortness of breath.   Cardiovascular: Negative for chest pain.  Gastrointestinal: Negative for abdominal pain, nausea and vomiting.  Musculoskeletal: Positive for arthralgias.  Neurological: Negative for weakness and numbness.  All other systems reviewed and are negative.    Physical Exam Updated Vital Signs BP 130/70   Pulse 76   Temp 98.5 F (36.9 C) (Oral)   Resp 15   Ht 6' (1.829 m)   Wt 61.2 kg   SpO2 90%   BMI 18.31  kg/m   Physical Exam  Constitutional: He appears well-developed and well-nourished. No distress.  HENT:  Head: Normocephalic and atraumatic.  Eyes: Conjunctivae are normal. Right eye exhibits no discharge. Left eye exhibits no discharge. Scleral icterus is present.  Neck: No JVD present. No tracheal deviation present.  Cardiovascular: Normal rate, regular rhythm and intact distal pulses.  Pulmonary/Chest: Effort normal and breath sounds normal.  Abdominal: Soft. Bowel sounds are normal. He exhibits no distension. There is no tenderness. There is no guarding.  Musculoskeletal: Normal range of motion. He exhibits tenderness. He exhibits no edema.  Diffuse tenderness to palpation of the right hip, right knee, and left knee.  No ligamentous laxity or varus or valgus instability noted.  5/5 strength of BLE major muscle groups.  Neurological: He is alert.  Fluent speech, no facial droop, sensation intact to soft touch of extremities.  Skin: Skin is warm and dry. No erythema.  Psychiatric: He has a normal mood and affect. His behavior is normal.  Nursing note and vitals reviewed.    ED Treatments / Results  Labs (all labs ordered are listed, but only abnormal results are displayed) Labs Reviewed  COMPREHENSIVE METABOLIC PANEL - Abnormal; Notable for the following components:      Result Value   CO2 20 (*)    Creatinine, Ser 1.49 (*)    AST 50 (*)    Total Bilirubin 2.5 (*)    GFR calc non Af Amer 57 (*)    All other components within normal limits  CBC WITH DIFFERENTIAL/PLATELET - Abnormal; Notable for the following components:   RBC 1.42 (*)    Hemoglobin 5.6 (*)    HCT 15.0 (*)    MCV 105.6 (*)    MCH 39.4 (*)    MCHC 37.3 (*)    RDW 22.1 (*)    Basophils Absolute 0.3 (*)    All other components within normal limits  RETICULOCYTES - Abnormal; Notable for the following components:   Retic Ct Pct 13.5 (*)    RBC. 1.42 (*)    Retic Count, Absolute 192.3 (*)    Immature Retic  Fract 33.8 (*)    All other components within normal limits  HIV ANTIBODY (ROUTINE TESTING W REFLEX)  TYPE AND SCREEN  PREPARE RBC (CROSSMATCH)    EKG None  Radiology No results found.  Procedures Procedures (including critical care time)  Medications Ordered in ED Medications  HYDROmorphone (DILAUDID) injection 1 mg (has no administration in time range)    Or  HYDROmorphone (DILAUDID) injection 1 mg (has no administration in time range)  famotidine (PEPCID) tablet 10 mg (has no administration in time range)  folic acid (FOLVITE) tablet 1 mg (has no administration in time range)  vitamin B-12 (CYANOCOBALAMIN) tablet 1,000 mcg (has no administration in time range)  Melatonin TABS 5 mg (has no administration in time range)  cyclobenzaprine (FLEXERIL) tablet 10 mg (has no administration in time range)  mometasone-formoterol (DULERA) 200-5 MCG/ACT inhaler 2 puff (has no administration in time range)  senna-docusate (Senokot-S) tablet 1 tablet (has no administration in time range)  polyethylene glycol (MIRALAX / GLYCOLAX) packet 17 g (has no administration in time range)  naloxone (NARCAN) injection 0.4 mg (has no administration in time range)    And  sodium chloride flush (NS) 0.9 % injection 9 mL (has no administration in time range)  ondansetron (ZOFRAN) injection 4 mg (has no administration in time range)  diphenhydrAMINE (BENADRYL) capsule 25 mg (has no administration in time range)    Or  diphenhydrAMINE (BENADRYL) 25 mg in sodium chloride 0.9 % 50 mL IVPB (has no administration in time range)  enoxaparin (LOVENOX) injection 40 mg (has no administration in time range)  0.45 % sodium chloride infusion (has no administration in time range)  ketorolac (TORADOL) 15 MG/ML injection 15 mg (has no administration in time range)  HYDROmorphone (DILAUDID) 1 mg/mL PCA injection (has no administration in time range)  albuterol (PROVENTIL) (2.5 MG/3ML) 0.083% nebulizer solution 2.5 mg  (has no administration in time range)  ketorolac (TORADOL) 30 MG/ML injection 30 mg (30 mg Intravenous Given 07/06/18 1951)  HYDROmorphone (DILAUDID) injection 0.5 mg (0.5 mg Intravenous Given 07/06/18 1951)    Or  HYDROmorphone (DILAUDID) injection 0.5 mg ( Subcutaneous See Alternative  07/06/18 1951)  HYDROmorphone (DILAUDID) injection 1 mg (1 mg Intravenous Given 07/06/18 2043)    Or  HYDROmorphone (DILAUDID) injection 1 mg ( Subcutaneous See Alternative 07/06/18 2043)  HYDROmorphone (DILAUDID) injection 1 mg (1 mg Intravenous Given 07/06/18 2123)    Or  HYDROmorphone (DILAUDID) injection 1 mg ( Subcutaneous See Alternative 07/06/18 2123)  diphenhydrAMINE (BENADRYL) injection 25 mg (25 mg Intravenous Given 07/06/18 2123)  ondansetron (ZOFRAN) injection 4 mg (4 mg Intravenous Given 07/06/18 2122)     Initial Impression / Assessment and Plan / ED Course  I have reviewed the triage vital signs and the nursing notes.  Pertinent labs & imaging results that were available during my care of the patient were reviewed by me and considered in my medical decision making (see chart for details).     Patient presents for evaluation of sickle cell pain crisis.  He is afebrile, vital signs stable.  He is nontoxic in appearance.  He is neurovascularly intact.  Lab work significant for hemoglobin of 5.6, mildly elevated creatinine of 1.49 although this appears to be better than the patient's baseline.  He does have an elevated reticulocyte count.  No evidence of acute chest syndrome, aplastic crisis, splenic sequestration, CVA, or other acute sickle cell pathology.  He received 3 doses of IV Dilaudid in the ED but states his pain went from 3/10 in severity to 7/10 in severity.  Will require admission for pain control.  Spoke with Dr. Antionette Char with [facility] who agrees to assume care of patient and bring him into the hospital for further evaluation and management.  Final Clinical Impressions(s)  / ED Diagnoses   Final diagnoses:  Sickle cell pain crisis [facility])    ED Discharge Orders    None       Jeanie Sewer, PA-C 07/06/18 2313    Vanetta Mulders, MD 07/08/18 [phone]

## 2018-07-07 LAB — HEMOGLOBIN AND HEMATOCRIT, BLOOD
HEMATOCRIT: 13.3 % — AB (ref 39.0–52.0)
Hemoglobin: 4.8 g/dL — CL (ref 13.0–17.0)

## 2018-07-07 LAB — PREPARE RBC (CROSSMATCH)

## 2018-07-07 LAB — HIV ANTIBODY (ROUTINE TESTING W REFLEX): HIV Screen 4th Generation wRfx: NONREACTIVE

## 2018-07-07 MED ORDER — ENSURE ENLIVE PO LIQD: 237.0000 mL | Freq: Two times a day (BID) | ORAL | Status: DC

## 2018-07-07 MED ORDER — SODIUM CHLORIDE 0.9% IV SOLUTION: Freq: Once | INTRAVENOUS | Status: DC

## 2018-07-07 MED ORDER — ORAL CARE MOUTH RINSE: 15.0000 mL | Freq: Two times a day (BID) | OROMUCOSAL | Status: DC

## 2018-07-07 MED ORDER — OXYCODONE HCL 5 MG PO TABS
10.0000 mg | ORAL_TABLET | ORAL | Status: DC | PRN
  Administered 2018-07-07 – 2018-07-08 (×4): 10 mg via ORAL
  Filled 2018-07-07 (×5): qty 2

## 2018-07-07 NOTE — Progress Notes (Signed)
CRITICAL VALUE ALERT  Critical Value:  HGB 4.8  Date & Time Notied:  07/07/2018 6:28AM  Provider Notified: DR. PATEL  Orders Received/Actions taken: WAITING FOR MD RESPONSE

## 2018-07-07 NOTE — Progress Notes (Signed)
Subjective: Patient is a 42 YO man with Sickle cell disease admitted with acute painful crisis. Patient also has hemolytic crisis with hemoglobin dropping to 4.8g.  2 units PRBC ordered. Patient has pain at 5/10. On PCA Dilaudid and Toradol. Patient has no other complaints.  Objective: Vital signs in last 24 hours: Temp:  [97.5 F (36.4 C)-98.5 F (36.9 C)] 97.7 F (36.5 C) (10/12 0705) Pulse Rate:  [51-96] 70 (10/12 0705) Resp:  [10-18] 12 (10/12 0705) BP: (101-130)/(61-76) 120/73 (10/12 0705) SpO2:  [90 %-100 %] 96 % (10/12 0705) Weight:  [60 kg-61.2 kg] 60 kg (10/12 0705) Weight change:  Last BM Date: 07/06/18  Intake/Output from previous day: 10/11 0701 - 10/12 0700 In: 1558 [P.O.:700; I.V.:833; Blood:25] Out: -  Intake/Output this shift: Total I/O In: 250 [Blood:250] Out: -   General appearance: alert, cooperative, appears stated age and no distress Head: Normocephalic, without obvious abnormality, atraumatic Resp: clear to auscultation bilaterally Cardio: regular rate and rhythm, S1, S2 normal, no murmur, click, rub or gallop GI: soft, non-tender; bowel sounds normal; no masses,  no organomegaly Extremities: extremities normal, atraumatic, no cyanosis or edema Pulses: 2+ and symmetric Skin: Skin color, texture, turgor normal. No rashes or lesions Neurologic: Grossly normal  Lab Results: Recent Labs    07/06/18 1924 07/07/18 0528  WBC 7.1  --   HGB 5.6* 4.8*  HCT 15.0* 13.3*  PLT 358  --    BMET Recent Labs    07/06/18 1924  NA 140  K 4.3  CL 111  CO2 20*  GLUCOSE 91  BUN 20  CREATININE 1.49*  CALCIUM 9.6    Studies/Results: No results found.  Medications: I have reviewed the patient's current medications.  Assessment/Plan: A 42 yo with Sickle cell painful crisis.  #1 Sickle Cell anemia with crisis: Continue pain management as well as transfusion. Patient can use oral Oxycodone while getting transfusion. Resume PCA after  #2 Sickle Cell  hemolytic crisis: Agree with PRBC transfusion. Monitor retic count and LDH  #3 Hx of Asthma: no exacerbation  #4 CKDIII: Continue monitoring renal function.  #5 Sarcoidosis: Continue follow up with Pulmonary at [facility].   LOS: 1 day   Charlaine Utsey,LAWAL 07/07/2018, 9:08 AM

## 2018-07-08 MED ORDER — OXYCODONE HCL 5 MG PO TABS
20.0000 mg | ORAL_TABLET | ORAL | Status: DC
  Administered 2018-07-08 – 2018-07-09 (×4): 20 mg via ORAL
  Filled 2018-07-08 (×4): qty 4

## 2018-07-08 MED ORDER — VITAMIN C 500 MG PO TABS
1000.0000 mg | ORAL_TABLET | Freq: Every day | ORAL | Status: DC
  Administered 2018-07-08: 1000 mg via ORAL
  Filled 2018-07-08: qty 2

## 2018-07-08 NOTE — Progress Notes (Signed)
Subjective: Patient is doing better but complain of pain this morning and last night.  He appears not to be using the PCA.  Patient actually appears as if he has no idea how to use the PCA.  He only used 3 mg of the Dilaudid in the last 24 hours.  No fever or chills.  Hemoglobin has improved with transfusion.  Pain is mainly in the back and legs and is currently a 6 out of 10.  Objective: Vital signs in last 24 hours: Temp:  [97.7 F (36.5 C)-98.1 F (36.7 C)] 97.7 F (36.5 C) (10/13 1330) Pulse Rate:  [64-89] 80 (10/13 1330) Resp:  [9-16] 16 (10/13 1330) BP: (103-136)/(56-96) 136/96 (10/13 1330) SpO2:  [93 %-100 %] 96 % (10/13 1330) Weight change: -1.235 kg Last BM Date: 07/06/18  Intake/Output from previous day: 10/12 0701 - 10/13 0700 In: 1432.8 [P.O.:240; Blood:1192.8] Out: 1425 [Urine:1425] Intake/Output this shift: Total I/O In: 240 [P.O.:240] Out: 800 [Urine:800]  General appearance: alert, cooperative, appears stated age and no distress Head: Normocephalic, without obvious abnormality, atraumatic Resp: clear to auscultation bilaterally Cardio: regular rate and rhythm, S1, S2 normal, no murmur, click, rub or gallop GI: soft, non-tender; bowel sounds normal; no masses,  no organomegaly Extremities: extremities normal, atraumatic, no cyanosis or edema Pulses: 2+ and symmetric Skin: Skin color, texture, turgor normal. No rashes or lesions Neurologic: Grossly normal  Lab Results: Recent Labs    07/06/18 1924 07/07/18 0528 07/08/18 0455  WBC 7.1  --   --   HGB 5.6* 4.8* 7.1*  HCT 15.0* 13.3* 19.8*  PLT 358  --   --    BMET Recent Labs    07/06/18 1924  NA 140  K 4.3  CL 111  CO2 20*  GLUCOSE 91  BUN 20  CREATININE 1.49*  CALCIUM 9.6    Studies/Results: No results found.  Medications: I have reviewed the patient's current medications.  Assessment/Plan: A 42 yo with Sickle cell painful crisis.  #1 Sickle Cell anemia with crisis: Patient was  counseled regarding the use of PCA pump.  He has oral medications but he has no use and off of the PCA.  He will use it excessively.  Once he has used it we will reevaluate in the morning for possible discharge.  Oral medications ordered.  #2 Sickle Cell hemolytic crisis: Hemoglobin is much better after transfusion.  It went from 4.8-7.1.  Continue treatment.  #3 Hx of Asthma: no exacerbation  #4 CKDIII: Continue monitoring renal function.  #5 Sarcoidosis: Continue follow up with Pulmonary at [facility].   LOS: 2 days   Thai Hemrick,LAWAL 07/08/2018, 4:03 PM

## 2018-07-08 NOTE — Progress Notes (Signed)
Nutrition Brief Note  Patient identified on the Malnutrition Screening Tool (MST) Report  Patient's weight is stable. Ensure supplements were ordered via ONS protocol. Will continue given underweight status.  Wt Readings from Last 15 Encounters:  07/07/18 60 kg  05/04/18 58.1 kg  04/09/18 61.2 kg  12/08/17 61.2 kg  10/05/17 60.8 kg  09/21/17 59.9 kg  09/13/17 59.2 kg  09/06/17 63.5 kg  06/27/17 63.5 kg  03/21/17 59.4 kg  01/10/17 63.5 kg  05/11/16 60.3 kg  05/04/16 60.2 kg  02/09/16 61.7 kg  12/07/15 63.5 kg    Body mass index is 17.94 kg/m. Patient meets criteria for underweight based on current BMI.   Current diet order is regular.Labs and medications reviewed.   No further nutrition interventions warranted at this time. If nutrition issues arise, please consult RD.   Tilda Franco, MS, RD, LDN [facility] Inpatient Clinical Dietitian Pager: [phone] After Hours Pager: [phone]

## 2018-07-09 DIAGNOSIS — G894 Chronic pain syndrome: Secondary | ICD-10-CM

## 2018-07-09 DIAGNOSIS — D638 Anemia in other chronic diseases classified elsewhere: Secondary | ICD-10-CM

## 2018-07-09 LAB — HEMOGLOBIN AND HEMATOCRIT, BLOOD
HCT: 19.8 % — ABNORMAL LOW (ref 39.0–52.0)
Hemoglobin: 7.1 g/dL — ABNORMAL LOW (ref 13.0–17.0)

## 2018-07-09 LAB — CBC
HCT: 20.2 % — ABNORMAL LOW (ref 39.0–52.0)
HEMOGLOBIN: 7.1 g/dL — AB (ref 13.0–17.0)
MCH: 34.1 pg — AB (ref 26.0–34.0)
MCHC: 35.1 g/dL (ref 30.0–36.0)
MCV: 97.1 fL (ref 80.0–100.0)
NRBC: 4.5 % — AB (ref 0.0–0.2)
PLATELETS: 368 10*3/uL (ref 150–400)
RBC: 2.08 MIL/uL — AB (ref 4.22–5.81)
RDW: 22.6 % — ABNORMAL HIGH (ref 11.5–15.5)
WBC: 7.4 10*3/uL (ref 4.0–10.5)

## 2018-07-09 NOTE — Discharge Instructions (Signed)
Sickle Cell Anemia, Adult °Sickle cell anemia is a condition where your red blood cells are shaped like sickles. Red blood cells carry oxygen through the body. Sickle-shaped red blood cells do not live as long as normal red blood cells. They also clump together and block blood from flowing through the blood vessels. These things prevent the body from getting enough oxygen. Sickle cell anemia causes organ damage and pain. It also increases the risk of infection. °Follow these instructions at home: °· Drink enough fluid to keep your pee (urine) clear or pale yellow. Drink more in hot weather and during exercise. °· Do not smoke. Smoking lowers oxygen levels in the blood. °· Only take over-the-counter or prescription medicines as told by your doctor. °· Take antibiotic medicines as told by your doctor. Make sure you finish them even if you start to feel better. °· Take supplements as told by your doctor. °· Consider wearing a medical alert bracelet. This tells anyone caring for you in an emergency of your condition. °· When traveling, keep your medical information, doctors' names, and the medicines you take with you at all times. °· If you have a fever, do not take fever medicines right away. This could cover up a problem. Tell your doctor. °· Keep all follow-up visits with your doctor. Sickle cell anemia requires regular medical care. °Contact a doctor if: °You have a fever. °Get help right away if: °· You feel dizzy or faint. °· You have new belly (abdominal) pain, especially on the left side near the stomach area. °· You have a lasting, often uncomfortable and painful erection of the penis (priapism). If it is not treated right away, you will become unable to have sex (impotence). °· You have numbness in your arms or legs or you have a hard time moving them. °· You have a hard time talking. °· You have a fever or lasting symptoms for more than 2-3 days. °· You have a fever and your symptoms suddenly get  worse. °· You have signs or symptoms of infection. These include: °? Chills. °? Being more tired than normal (lethargy). °? Irritability. °? Poor eating. °? Throwing up (vomiting). °· You have pain that is not helped with medicine. °· You have shortness of breath. °· You have pain in your chest. °· You are coughing up pus-like or bloody mucus. °· You have a stiff neck. °· Your feet or hands swell or have pain. °· Your belly looks bloated. °· Your joints hurt. °This information is not intended to replace advice given to you by your health care provider. Make sure you discuss any questions you have with your health care provider. °Document Released: 07/03/2013 Document Revised: 02/18/2016 Document Reviewed: 04/24/2013 °Elsevier Interactive Patient Education © 2017 [employer] ° °

## 2018-07-09 NOTE — Discharge Summary (Signed)
Physician Discharge Summary  Shawn Adams ZOX:[id number] DOB: 1976-08-13 DOA: 07/06/2018  PCP: Mike Gip, FNP  Admit date: 07/06/2018  Discharge date: 07/09/2018  Discharge Diagnoses:  Principal Problem:   Sickle cell anemia with crisis [facility]) Active Problems:   CKD (chronic kidney disease) stage 3, GFR 30-59 ml/min (HCC)   Sickle cell pain crisis (HCC)   Sarcoidosis   Persistent asthma without complication   Discharge Condition: Stable  Disposition:  Follow-up Information    Mike Gip, FNP Follow up.   Specialty:  Family Medicine Contact information: [address] [place] [address] [phone]        Christella Hartigan, PA-C Follow up.   Contact information: [facility] [place] [address] [phone]          Pt is discharged home in good condition and is to follow up with Mike Gip, FNP this week to have labs evaluated. Shawn Adams is instructed to increase activity slowly and balance with rest for the next few days, and use prescribed medication to complete treatment of pain  Diet: Regular Wt Readings from Last 3 Encounters:  07/09/18 62.1 kg  05/04/18 58.1 kg  04/09/18 61.2 kg    History of present illness:  Shawn Adams, a 42 year old male with a medical history significant for sickle cell anemia, sarcoidosis with pulmonary involvement, stage III chronic kidney disease, and chronic pain syndrome presented to the emergency department for evaluation of severe pain to bilateral hips and knees.  Patient states that pain developed several days prior to admission.  He was initially able to manage pain with home opiate medication regimen, but pain continued to worsen and became severe despite using medications at home.  Patient denies fever, chills, chest pain, or heart palpitations.  Patient reports increase in dyspnea with occasional wheezing.  Patient has a history of asthma.  Patient denies any melena or  hematochezia.  ER course: Vital signs within normal range. Patient maintaining oxygen saturation above 90% on room air.  CBC was notable for hemoglobin of 5.6.  Type and screen in the emergency department.  IV fluids, IV Toradol, and multiple doses of IV Dilaudid were given.  Patient reported minimal improvement, was admitted for ongoing evaluation and management of sickle cell pain crisis.  Hospital Course:   Sickle cell pain crisis: Patient was admitted for sickle cell pain crisis and managed appropriately with IVF, IV Dilaudid via PCA as well as other adjunct therapies per sickle cell pain management protocols. Patient states that pain improved to 5/10.  He says that he can manage at home.  Patient states that he is out of home opiate medications.  He is not due for prescription refill until 07/13/2018.  Pain is managed by patient's hematologist.  Dr. Trinna Balloon, hematologist was notified and agreed to send prescription for Oxycodone 20 mg.   Chronic pain syndrome: Patient will continue oxycodone 20 mg every 4 hours as needed for moderate to severe pain  Anemia of chronic disease: Hemoglobin 5.6 on admission.  Patient transfused 1 unit of PRBCs.  Hemoglobin improved to 7.1.  Patient scheduled to repeat CBC on 07/12/2018.  Chronic kidney disease: Stable.  Creatinine 1.49 on admission.  Patient is followed by [facility] nephrology.  Advised to follow-up as scheduled.  History of asthma: Continue MDIs as needed  History of sarcoidosis: Patient has follow-up with [facility] pulmonology, remained stable throughout admission.  Patient advised to follow-up as scheduled.    Patient was  discharged home today in a hemodynamically stable condition.   Discharge Exam: Vitals:   07/09/18 0936 07/09/18 1140  BP: 121/63   Pulse: 83   Resp: 16 10  Temp: 97.8 F (36.6 C)   SpO2: 90% 96%   Vitals:   07/09/18 0746 07/09/18 0800 07/09/18 0936 07/09/18 1140  BP:   121/63   Pulse:   83    Resp:  (!) 9 16 10   Temp:   97.8 F (36.6 C)   TempSrc:   Oral   SpO2: 95% 96% 90% 96%  Weight:      Height:       Physical Exam  Constitutional: He is oriented to person, place, and time. He appears well-developed and well-nourished.  HENT:  Head: Normocephalic.  Eyes: Pupils are equal, round, and reactive to light.  Neck: Normal range of motion.  Cardiovascular: Normal rate and regular rhythm.  Pulmonary/Chest: Effort normal and breath sounds normal.  Abdominal: Soft. Bowel sounds are normal.  Musculoskeletal: Normal range of motion.  Neurological: He is alert and oriented to person, place, and time.  Skin: Skin is warm and dry.  Psychiatric: He has a normal mood and affect. His behavior is normal. Judgment and thought content normal.     Discharge Instructions  Discharge Instructions    Discharge patient   Complete by:  As directed    Discharge disposition:  01-Home or Self Care   Discharge patient date:  07/09/2018     Allergies as of 07/09/2018   No Known Allergies     Medication List    TAKE these medications   acetaminophen 500 MG tablet Commonly known as:  TYLENOL Take 1,000-1,500 mg by mouth every 6 (six) hours as needed for moderate pain or fever (pain).   albuterol 108 (90 Base) MCG/ACT inhaler Commonly known as:  PROVENTIL HFA;VENTOLIN HFA Inhale 2 puffs into the lungs every 4 (four) hours as needed for wheezing or shortness of breath (cough, shortness of breath or wheezing.). What changed:  reasons to take this   cyclobenzaprine 10 MG tablet Commonly known as:  FLEXERIL Take 1 tablet (10 mg total) by mouth 3 (three) times daily as needed for muscle spasms.   famotidine 10 MG tablet Commonly known as:  PEPCID Take 10 mg by mouth daily as needed for heartburn or indigestion.   folic acid 1 MG tablet Commonly known as:  FOLVITE Take 1 tablet (1 mg total) by mouth daily. What changed:  when to take this   hydroxyurea 500 MG capsule Commonly  known as:  HYDREA Take 2 capsules (1,000 mg total) by mouth daily. May take with food to minimize GI side effects. What changed:  when to take this   MELATONIN PO Take by mouth at bedtime.   naproxen 500 MG tablet Commonly known as:  NAPROSYN Take 1 tablet (500 mg total) by mouth 2 (two) times daily as needed for mild pain, moderate pain or headache (TAKE WITH MEALS.).   ondansetron 4 MG tablet Commonly known as:  ZOFRAN Take 1 tablet (4 mg total) by mouth every 6 (six) hours. What changed:    when to take this  reasons to take this   Oxycodone HCl 20 MG Tabs Take 20 mg by mouth every 4 (four) hours. scheduled   SYMBICORT 160-4.5 MCG/ACT inhaler Generic drug:  budesonide-formoterol Inhale 2 puffs into the lungs 2 (two) times daily.   vitamin B-12 1000 MCG tablet Commonly known as:  CYANOCOBALAMIN Take 1,000 mcg by  mouth at bedtime.   vitamin C 1000 MG tablet Take 1,000 mg by mouth daily.   VITAMIN D PO Take 1 tablet by mouth every Monday.       The results of significant diagnostics from this hospitalization (including imaging, microbiology, ancillary and laboratory) are listed below for reference.    Significant Diagnostic Studies: No results found.  Microbiology: No results found for this or any previous visit (from the past 240 hour(s)).   Labs: Basic Metabolic Panel: Recent Labs  Lab 07/06/18 1924  NA 140  K 4.3  CL 111  CO2 20*  GLUCOSE 91  BUN 20  CREATININE 1.49*  CALCIUM 9.6   Liver Function Tests: Recent Labs  Lab 07/06/18 1924  AST 50*  ALT 19  ALKPHOS 109  BILITOT 2.5*  PROT 7.8  ALBUMIN 3.9   No results for input(s): LIPASE, AMYLASE in the last 168 hours. No results for input(s): AMMONIA in the last 168 hours. CBC: Recent Labs  Lab 07/06/18 1924 07/07/18 0528 07/08/18 0455 07/09/18 0915  WBC 7.1  --   --  7.4  NEUTROABS 3.4  --   --   --   HGB 5.6* 4.8* 7.1* 7.1*  HCT 15.0* 13.3* 19.8* 20.2*  MCV 105.6*  --   --  97.1   PLT 358  --   --  368   Cardiac Enzymes: No results for input(s): CKTOTAL, CKMB, CKMBINDEX, TROPONINI in the last 168 hours. BNP: Invalid input(s): POCBNP CBG: No results for input(s): GLUCAP in the last 168 hours.  Time coordinating discharge: 50 minutes  Signed: Nolon Nations  APRN, MSN, FNP-C Patient Care [facility] [address] [place], [place] [address] [phone]  [facility] 07/09/2018, 4:42 PM

## 2018-07-09 NOTE — Progress Notes (Signed)
Pt discharged home in stable condition. Discharge instructions given. Pt verbalized understanding. No immediate questions or concerns at this time. Pt discharged from unit via wheelchair.  

## 2018-07-09 NOTE — Care Management Important Message (Signed)
Important Message  Patient Details  Name: Shawn Adams MRN: [id number] Date of Birth: May 16, 1976   Medicare Important Message Given:  Yes    Caren Macadam 07/09/2018, 11:05 AMImportant Message  Patient Details  Name: Shawn Adams MRN: [id number] Date of Birth: 1976-02-08   Medicare Important Message Given:  Yes    Caren Macadam 07/09/2018, 11:05 AM

## 2018-07-10 LAB — BPAM RBC
Blood Product Expiration Date: 201910202359
Blood Product Expiration Date: 201911042359
Blood Product Expiration Date: 201911042359
ISSUE DATE / TIME: 201910120643
ISSUE DATE / TIME: 201910121034
Unit Type and Rh: 5100
Unit Type and Rh: 5100
Unit Type and Rh: 7300

## 2018-07-10 LAB — TYPE AND SCREEN
ABO/RH(D): B POS
Antibody Screen: NEGATIVE
Donor AG Type: NEGATIVE
Donor AG Type: NEGATIVE
Donor AG Type: NEGATIVE
UNIT DIVISION: 0
Unit division: 0
Unit division: 0

## 2018-07-13 ENCOUNTER — Other Ambulatory Visit: Payer: Medicare Other

## 2018-07-17 ENCOUNTER — Other Ambulatory Visit: Payer: Medicare Other

## 2018-07-26 DIAGNOSIS — D869 Sarcoidosis, unspecified: Secondary | ICD-10-CM | POA: Diagnosis not present

## 2018-07-26 DIAGNOSIS — N529 Male erectile dysfunction, unspecified: Secondary | ICD-10-CM | POA: Diagnosis not present

## 2018-08-06 ENCOUNTER — Ambulatory Visit (HOSPITAL_COMMUNITY)
Admission: RE | Admit: 2018-08-06 | Discharge: 2018-08-06 | Disposition: A | Discharge status: Home / Self Care | Payer: Medicare HMO | Source: Ambulatory Visit | Attending: Family Medicine | Admitting: Family Medicine

## 2018-08-06 ENCOUNTER — Encounter: Payer: Self-pay | Admitting: Family Medicine

## 2018-08-06 ENCOUNTER — Ambulatory Visit (INDEPENDENT_AMBULATORY_CARE_PROVIDER_SITE_OTHER): Payer: Medicare HMO | Admitting: Family Medicine

## 2018-08-06 VITALS — BP 133/72 | HR 77 | Temp 97.9°F | Resp 16 | Ht 72.0 in | Wt 128.0 lb

## 2018-08-06 DIAGNOSIS — N529 Male erectile dysfunction, unspecified: Secondary | ICD-10-CM | POA: Diagnosis not present

## 2018-08-06 DIAGNOSIS — M25521 Pain in right elbow: Secondary | ICD-10-CM | POA: Diagnosis not present

## 2018-08-06 DIAGNOSIS — D571 Sickle-cell disease without crisis: Secondary | ICD-10-CM | POA: Diagnosis not present

## 2018-08-06 DIAGNOSIS — N4830 Priapism, unspecified: Secondary | ICD-10-CM | POA: Diagnosis not present

## 2018-08-06 DIAGNOSIS — D5700 Hb-SS disease with crisis, unspecified: Secondary | ICD-10-CM | POA: Diagnosis not present

## 2018-08-06 LAB — POCT URINALYSIS DIPSTICK
Bilirubin, UA: NEGATIVE
Glucose, UA: NEGATIVE
Ketones, UA: NEGATIVE
Leukocytes, UA: NEGATIVE
Nitrite, UA: NEGATIVE
Protein, UA: POSITIVE — AB
Spec Grav, UA: 1.015 (ref 1.010–1.025)
Urobilinogen, UA: 2 E.U./dL — AB
pH, UA: 6.5 (ref 5.0–8.0)

## 2018-08-06 MED ORDER — BACITRACIN 500 UNIT/GM EX OINT: 1.0000 "application " | TOPICAL_OINTMENT | Freq: Two times a day (BID) | CUTANEOUS | 0 refills | Status: DC
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
## 2018-08-17 DIAGNOSIS — D869 Sarcoidosis, unspecified: Secondary | ICD-10-CM | POA: Diagnosis not present

## 2018-08-17 DIAGNOSIS — Z87891 Personal history of nicotine dependence: Secondary | ICD-10-CM | POA: Diagnosis not present

## 2018-08-17 DIAGNOSIS — J4540 Moderate persistent asthma, uncomplicated: Secondary | ICD-10-CM | POA: Diagnosis not present

## 2018-08-17 DIAGNOSIS — Z7951 Long term (current) use of inhaled steroids: Secondary | ICD-10-CM | POA: Diagnosis not present

## 2018-08-17 DIAGNOSIS — D571 Sickle-cell disease without crisis: Secondary | ICD-10-CM | POA: Diagnosis not present

## 2018-08-17 DIAGNOSIS — J329 Chronic sinusitis, unspecified: Secondary | ICD-10-CM | POA: Diagnosis not present

## 2018-08-27 DIAGNOSIS — D571 Sickle-cell disease without crisis: Secondary | ICD-10-CM | POA: Diagnosis not present

## 2018-08-27 DIAGNOSIS — T8089XA Other complications following infusion, transfusion and therapeutic injection, initial encounter: Secondary | ICD-10-CM | POA: Diagnosis not present

## 2018-08-27 DIAGNOSIS — D869 Sarcoidosis, unspecified: Secondary | ICD-10-CM | POA: Diagnosis not present

## 2018-08-27 DIAGNOSIS — Z79899 Other long term (current) drug therapy: Secondary | ICD-10-CM | POA: Diagnosis not present

## 2018-08-27 DIAGNOSIS — M7918 Myalgia, other site: Secondary | ICD-10-CM | POA: Diagnosis not present

## 2018-08-27 DIAGNOSIS — R0981 Nasal congestion: Secondary | ICD-10-CM | POA: Diagnosis not present

## 2018-08-27 DIAGNOSIS — J45909 Unspecified asthma, uncomplicated: Secondary | ICD-10-CM | POA: Diagnosis not present

## 2018-08-27 DIAGNOSIS — J4540 Moderate persistent asthma, uncomplicated: Secondary | ICD-10-CM | POA: Diagnosis not present

## 2018-08-27 DIAGNOSIS — G8929 Other chronic pain: Secondary | ICD-10-CM | POA: Diagnosis not present

## 2018-08-27 DIAGNOSIS — N189 Chronic kidney disease, unspecified: Secondary | ICD-10-CM | POA: Diagnosis not present

## 2018-08-27 DIAGNOSIS — Z79891 Long term (current) use of opiate analgesic: Secondary | ICD-10-CM | POA: Diagnosis not present

## 2018-08-27 DIAGNOSIS — J42 Unspecified chronic bronchitis: Secondary | ICD-10-CM | POA: Diagnosis not present

## 2018-08-31 DIAGNOSIS — R234 Changes in skin texture: Secondary | ICD-10-CM | POA: Diagnosis not present

## 2018-08-31 DIAGNOSIS — Z7952 Long term (current) use of systemic steroids: Secondary | ICD-10-CM | POA: Diagnosis not present

## 2018-08-31 DIAGNOSIS — J3489 Other specified disorders of nose and nasal sinuses: Secondary | ICD-10-CM | POA: Diagnosis not present

## 2018-08-31 DIAGNOSIS — Z87891 Personal history of nicotine dependence: Secondary | ICD-10-CM | POA: Diagnosis not present

## 2018-08-31 DIAGNOSIS — Z9889 Other specified postprocedural states: Secondary | ICD-10-CM | POA: Diagnosis not present

## 2018-08-31 DIAGNOSIS — J0191 Acute recurrent sinusitis, unspecified: Secondary | ICD-10-CM | POA: Diagnosis not present

## 2018-08-31 DIAGNOSIS — D571 Sickle-cell disease without crisis: Secondary | ICD-10-CM | POA: Diagnosis not present

## 2018-08-31 DIAGNOSIS — J310 Chronic rhinitis: Secondary | ICD-10-CM | POA: Diagnosis not present

## 2018-08-31 DIAGNOSIS — R040 Epistaxis: Secondary | ICD-10-CM | POA: Diagnosis not present

## 2018-09-05 DIAGNOSIS — R0609 Other forms of dyspnea: Secondary | ICD-10-CM | POA: Diagnosis not present

## 2018-09-05 DIAGNOSIS — R809 Proteinuria, unspecified: Secondary | ICD-10-CM | POA: Diagnosis not present

## 2018-09-05 DIAGNOSIS — N528 Other male erectile dysfunction: Secondary | ICD-10-CM | POA: Diagnosis not present

## 2018-09-05 DIAGNOSIS — N189 Chronic kidney disease, unspecified: Secondary | ICD-10-CM | POA: Diagnosis not present

## 2018-09-05 DIAGNOSIS — R945 Abnormal results of liver function studies: Secondary | ICD-10-CM | POA: Diagnosis not present

## 2018-09-05 DIAGNOSIS — D869 Sarcoidosis, unspecified: Secondary | ICD-10-CM | POA: Diagnosis not present

## 2018-09-05 DIAGNOSIS — D860 Sarcoidosis of lung: Secondary | ICD-10-CM | POA: Diagnosis not present

## 2018-09-05 DIAGNOSIS — D571 Sickle-cell disease without crisis: Secondary | ICD-10-CM | POA: Diagnosis not present

## 2018-09-13 ENCOUNTER — Emergency Department (HOSPITAL_COMMUNITY)
Admission: EM | Admit: 2018-09-13 | Discharge: 2018-09-13 | Disposition: A | Discharge status: Home / Self Care | Payer: Medicare HMO | Attending: Emergency Medicine | Admitting: Emergency Medicine

## 2018-09-13 ENCOUNTER — Encounter (HOSPITAL_COMMUNITY): Payer: Self-pay | Admitting: Emergency Medicine

## 2018-09-13 ENCOUNTER — Other Ambulatory Visit: Payer: Self-pay

## 2018-09-13 ENCOUNTER — Emergency Department (HOSPITAL_COMMUNITY): Payer: Medicare HMO

## 2018-09-13 DIAGNOSIS — Z79899 Other long term (current) drug therapy: Secondary | ICD-10-CM | POA: Diagnosis not present

## 2018-09-13 DIAGNOSIS — Z87891 Personal history of nicotine dependence: Secondary | ICD-10-CM | POA: Insufficient documentation

## 2018-09-13 DIAGNOSIS — N183 Chronic kidney disease, stage 3 (moderate): Secondary | ICD-10-CM | POA: Diagnosis not present

## 2018-09-13 DIAGNOSIS — D5700 Hb-SS disease with crisis, unspecified: Secondary | ICD-10-CM | POA: Insufficient documentation

## 2018-09-13 DIAGNOSIS — R0602 Shortness of breath: Secondary | ICD-10-CM | POA: Diagnosis not present

## 2018-09-13 LAB — CBC WITH DIFFERENTIAL/PLATELET
Abs Immature Granulocytes: 0.12 10*3/uL — ABNORMAL HIGH (ref 0.00–0.07)
Basophils Absolute: 0.1 10*3/uL (ref 0.0–0.1)
Basophils Relative: 1 %
Eosinophils Absolute: 0.3 10*3/uL (ref 0.0–0.5)
Eosinophils Relative: 2 %
HCT: 17.4 % — ABNORMAL LOW (ref 39.0–52.0)
Hemoglobin: 6.3 g/dL — CL (ref 13.0–17.0)
Immature Granulocytes: 1 %
Lymphocytes Relative: 14 %
Lymphs Abs: 1.7 10*3/uL (ref 0.7–4.0)
MCH: 34.6 pg — ABNORMAL HIGH (ref 26.0–34.0)
MCHC: 36.2 g/dL — ABNORMAL HIGH (ref 30.0–36.0)
MCV: 95.6 fL (ref 80.0–100.0)
Monocytes Absolute: 0.8 10*3/uL (ref 0.1–1.0)
Monocytes Relative: 7 %
Neutro Abs: 9 10*3/uL — ABNORMAL HIGH (ref 1.7–7.7)
Neutrophils Relative %: 75 %
Platelets: 429 10*3/uL — ABNORMAL HIGH (ref 150–400)
RBC: 1.82 MIL/uL — ABNORMAL LOW (ref 4.22–5.81)
RDW: 23.7 % — ABNORMAL HIGH (ref 11.5–15.5)
WBC: 12 10*3/uL — ABNORMAL HIGH (ref 4.0–10.5)
nRBC: 2.8 % — ABNORMAL HIGH (ref 0.0–0.2)

## 2018-09-13 LAB — COMPREHENSIVE METABOLIC PANEL
ALT: 35 U/L (ref 0–44)
ANION GAP: 12 (ref 5–15)
AST: 79 U/L — ABNORMAL HIGH (ref 15–41)
Albumin: 3.9 g/dL (ref 3.5–5.0)
Alkaline Phosphatase: 147 U/L — ABNORMAL HIGH (ref 38–126)
BUN: 24 mg/dL — ABNORMAL HIGH (ref 6–20)
CALCIUM: 9.2 mg/dL (ref 8.9–10.3)
CHLORIDE: 109 mmol/L (ref 98–111)
CO2: 17 mmol/L — ABNORMAL LOW (ref 22–32)
Creatinine, Ser: 2.1 mg/dL — ABNORMAL HIGH (ref 0.61–1.24)
GFR calc non Af Amer: 38 mL/min — ABNORMAL LOW (ref >60)
GFR, EST AFRICAN AMERICAN: 44 mL/min — AB (ref >60)
Glucose, Bld: 108 mg/dL — ABNORMAL HIGH (ref 70–99)
Potassium: 4.4 mmol/L (ref 3.5–5.1)
SODIUM: 138 mmol/L (ref 135–145)
Total Bilirubin: 2.5 mg/dL — ABNORMAL HIGH (ref 0.3–1.2)
Total Protein: 8.2 g/dL — ABNORMAL HIGH (ref 6.5–8.1)

## 2018-09-13 LAB — RETICULOCYTES
Immature Retic Fract: 28.6 % — ABNORMAL HIGH (ref 2.3–15.9)
RBC.: 1.82 MIL/uL — ABNORMAL LOW (ref 4.22–5.81)
Retic Count, Absolute: 287.4 10*3/uL — ABNORMAL HIGH (ref 19.0–186.0)
Retic Ct Pct: 15.8 % — ABNORMAL HIGH (ref 0.4–3.1)

## 2018-09-13 MED ORDER — HYDROMORPHONE HCL 1 MG/ML IJ SOLN
1.0000 mg | INTRAMUSCULAR | Status: AC
  Administered 2018-09-13: 1 mg via INTRAVENOUS
  Filled 2018-09-13: qty 1

## 2018-09-13 MED ORDER — DEXTROSE-NACL 5-0.45 % IV SOLN
INTRAVENOUS | Status: DC
  Administered 2018-09-13: 05:00:00 via INTRAVENOUS

## 2018-09-13 MED ORDER — HYDROMORPHONE HCL 1 MG/ML IJ SOLN
1.0000 mg | INTRAMUSCULAR | Status: AC
  Administered 2018-09-13: 1 mg via INTRAVENOUS
  Filled 2018-09-13 (×2): qty 1

## 2018-09-13 MED ORDER — DIPHENHYDRAMINE HCL 50 MG/ML IJ SOLN
25.0000 mg | Freq: Once | INTRAMUSCULAR | Status: AC
  Administered 2018-09-13: 25 mg via INTRAVENOUS
  Filled 2018-09-13: qty 1

## 2018-09-13 NOTE — ED Notes (Signed)
Pt keeps taking Nasal Cannula out of nose.  Pt was advised the importance of keeping nasal cannula to maintain oxygen saturations greater than 94% Pt states he understands.

## 2018-09-13 NOTE — ED Provider Notes (Addendum)
[name] Provider Note   CSN: [id number] Arrival date & time: 09/13/18  0327  Time seen 4:15 AM   History   Chief Complaint Chief Complaint  Patient presents with  . Sickle Cell Pain Crisis    HPI Shawn Adams is a 42 y.o. male.  HPI patient has a history of sickle cell anemia, he states he has been well controlled, he was admitted at [facility] this year and then in October he was admitted here.  He states the evening of December 18 he started having pain in his right knee down to his feet, his left leg and his left arm.  He states usually his crisis is in his back and then into his extremities.  He is not sure if he had a fever at home but he states about 30 minutes prior to arrival he had a brief episode of chills.  He denies chest pain now but states earlier he had some discomfort in his lower chest wall bilaterally.  He states that lasted 15 to 20 minutes.  He states he has had that pain before with pneumonia however he denies any coughing but does have shortness of breath tonight.  He denies nausea, vomiting, diarrhea, sore throat, rhinorrhea, but only complains of a dry mouth.  He took oxycodone in the morning on December 18 and this evening at 10:00 he took Tylenol.  He did not take any other medicine.  He states he has had transfusions in the past but generally his hemoglobin has to get to 5 before they transfuse him.  PCP Lanae Boast, [place] PCP [facility]  Past Medical History:  Diagnosis Date  . Aneurysm ([place])    Pt states happened in 2011, Brain   . Heart murmur   . Sarcoid   . Sickle cell anemia [facility])     Patient Active Problem List   Diagnosis Date Noted  . Chronic pain syndrome   . Sarcoidosis 07/06/2018  . Persistent asthma without complication 95/62/1308  . Headache 05/03/2016  . Anemia of chronic disease 05/03/2016  . Headache disorder   . Vitamin D deficiency 02/09/2016  . Heart murmur  02/09/2016  . Recurrent occipital headache 10/15/2015  . History of aneurysm 10/13/2015  . Left hip pain 07/30/2015  . Left ankle pain 07/08/2015  . Sickle cell crisis ([place]) 03/08/2015  . 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 Sickle cell anemia with crisis ([place]) 12/07/2014  . Sickle cell anemia ([place]) 06/20/2014  . CKD (chronic kidney disease) stage 3, GFR 30-59 ml/min (HCC) 06/20/2014  . Leukocytosis 06/20/2014  . Sickle cell pain crisis ([place]) 06/20/2014  . Hypercalcemia 06/20/2014  . Symptomatic anemia 05/31/2014    Past Surgical History:  Procedure Laterality Date  . LYMPH NODE BIOPSY          Home Medications    Prior to Admission medications   Medication Sig Start Date End Date Taking? Authorizing Provider  acetaminophen (TYLENOL) 500 MG tablet Take 1,000-1,500 mg by mouth every 6 (six) hours as needed for moderate pain or fever (pain).    Yes [provider]  albuterol (PROVENTIL HFA;VENTOLIN HFA) 108 (90 Base) MCG/ACT inhaler Inhale 2 puffs into the lungs every 4 (four) hours as needed for wheezing or shortness of breath (cough, shortness of breath or wheezing.). Patient taking differently: Inhale 2 puffs into the lungs every 4 (four) hours as needed for wheezing or shortness of breath (cough).  09/21/17  Yes  Dorena Dew, FNP  budesonide-formoterol [facility]) 160-4.5 MCG/ACT inhaler Inhale 2 puffs into the lungs 2 (two) times daily. 05/10/18 05/10/19 Yes [provider]  cyclobenzaprine (FLEXERIL) 10 MG tablet Take 1 tablet (10 mg total) by mouth 3 (three) times daily as needed for muscle spasms. 04/09/18  Yes Street, [place], PA-C  famotidine (PEPCID) 10 MG tablet Take 10 mg by mouth daily as needed for heartburn or indigestion.   Yes [provider]  folic acid (FOLVITE) 1 MG tablet Take 1 tablet (1 mg total) by mouth daily. Patient taking differently: Take 1 mg by mouth at bedtime.  02/09/16  Yes [name], MD    hydroxyurea (HYDREA) 500 MG capsule Take 2 capsules (1,000 mg total) by mouth daily. May take with food to minimize GI side effects. Patient taking differently: Take 1,000 mg by mouth at bedtime. May take with food to minimize GI side effects. 02/09/16  Yes [name], MD  MELATONIN PO Take by mouth at bedtime.    Yes [provider]  ondansetron (ZOFRAN) 4 MG tablet Take 1 tablet (4 mg total) by mouth every 6 (six) hours. Patient taking differently: Take 4 mg by mouth every 6 (six) hours as needed for nausea or vomiting.  12/21/15  Yes Hedges, Dellis Filbert, PA-C  Oxycodone HCl 20 MG TABS Take 20 mg by mouth every 4 (four) hours. scheduled   Yes [provider]  bacitracin 500 UNIT/GM ointment Apply 1 application topically 2 (two) times daily. Patient not taking: Reported on 09/13/2018 08/06/18   Lanae Boast, FNP  naproxen (NAPROSYN) 500 MG tablet Take 1 tablet (500 mg total) by mouth 2 (two) times daily as needed for mild pain, moderate pain or headache (TAKE WITH MEALS.). Patient not taking: Reported on 08/06/2018 04/09/18   Street, [place], PA-C    Family History Family History  Problem Relation Age of Onset  . Diabetes Mother   . Hypertension Father   . Lupus Sister     Social History Social History   Tobacco Use  . Smoking status: Former Smoker    Types: Cigarettes  . Smokeless tobacco: Never Used  Substance Use Topics  . Alcohol use: Yes    Alcohol/week: 1.0 standard drinks    Types: 1 Cans of beer per week    Comment: occasionally  . Drug use: Not Currently    Types: Marijuana  On disability   Allergies   Patient has no known allergies.   Review of Systems Review of Systems  All other systems reviewed and are negative.    Physical Exam Updated Vital Signs BP 121/61   Pulse (!) 105   Temp 99 F (37.2 C) (Oral)   Resp 17   Ht 6' (1.829 m)   Wt 63.5 kg   SpO2 94%   BMI 18.99 kg/m   Vital signs normal set for  tachycardia   Physical Exam Vitals signs and nursing note reviewed.  Constitutional:      Appearance: Normal appearance. He is well-developed. He is not ill-appearing or toxic-appearing.     Comments: Patient appears mildly uncomfortable  HENT:     Head: Normocephalic and atraumatic.     Right Ear: External ear normal.     Left Ear: External ear normal.     Nose: Nose normal. No mucosal edema or rhinorrhea.     Mouth/Throat:     Dentition: No dental abscesses.     Pharynx: No uvula swelling.  Eyes:  General: Scleral icterus present.     Conjunctiva/sclera: Conjunctivae normal.     Pupils: Pupils are equal, round, and reactive to light.  Neck:     Musculoskeletal: Full passive range of motion without pain, normal range of motion and neck supple.  Cardiovascular:     Rate and Rhythm: Normal rate and regular rhythm.     Heart sounds: No murmur. No friction rub. Gallop present. S3 sounds present.   Pulmonary:     Effort: Pulmonary effort is normal. No respiratory distress.     Breath sounds: Normal breath sounds. No wheezing, rhonchi or rales.  Chest:     Chest wall: No tenderness or crepitus.  Abdominal:     General: Bowel sounds are normal. There is no distension.     Palpations: Abdomen is soft.     Tenderness: There is no abdominal tenderness. There is no guarding or rebound.  Musculoskeletal: Normal range of motion.        General: No tenderness.     Comments: Moves all extremities well.  There are no joint effusions appreciated.  Skin:    General: Skin is warm and dry.     Coloration: Skin is not pale.     Findings: No erythema or rash.     Comments: Skin feels very warm to touch.  Neurological:     Mental Status: He is alert and oriented to person, place, and time.     Cranial Nerves: No cranial nerve deficit.  Psychiatric:        Mood and Affect: Mood is not anxious.        Speech: Speech normal.        Behavior: Behavior normal.      ED Treatments /  Results  Labs (all labs ordered are listed, but only abnormal results are displayed) Results for orders placed or performed during the hospital encounter of 09/13/18  Comprehensive metabolic panel  Result Value Ref Range   Sodium 138 135 - 145 mmol/L   Potassium 4.4 3.5 - 5.1 mmol/L   Chloride 109 98 - 111 mmol/L   CO2 17 (L) 22 - 32 mmol/L   Glucose, Bld 108 (H) 70 - 99 mg/dL   BUN 24 (H) 6 - 20 mg/dL   Creatinine, Ser 2.10 (H) 0.61 - 1.24 mg/dL   Calcium 9.2 8.9 - 10.3 mg/dL   Total Protein 8.2 (H) 6.5 - 8.1 g/dL   Albumin 3.9 3.5 - 5.0 g/dL   AST 79 (H) 15 - 41 U/L   ALT 35 0 - 44 U/L   Alkaline Phosphatase 147 (H) 38 - 126 U/L   Total Bilirubin 2.5 (H) 0.3 - 1.2 mg/dL   GFR calc non Af Amer 38 (L) >60 mL/min   GFR calc Af Amer 44 (L) >60 mL/min   Anion gap 12 5 - 15  CBC with Differential  Result Value Ref Range   WBC 12.0 (H) 4.0 - 10.5 K/uL   RBC 1.82 (L) 4.22 - 5.81 MIL/uL   Hemoglobin 6.3 (LL) 13.0 - 17.0 g/dL   HCT 17.4 (L) 39.0 - 52.0 %   MCV 95.6 80.0 - 100.0 fL   MCH 34.6 (H) 26.0 - 34.0 pg   MCHC 36.2 (H) 30.0 - 36.0 g/dL   RDW 23.7 (H) 11.5 - 15.5 %   Platelets 429 (H) 150 - 400 K/uL   nRBC 2.8 (H) 0.0 - 0.2 %   Neutrophils Relative % 75 %   Neutro Abs 9.0 (H)  1.7 - 7.7 K/uL   Lymphocytes Relative 14 %   Lymphs Abs 1.7 0.7 - 4.0 K/uL   Monocytes Relative 7 %   Monocytes Absolute 0.8 0.1 - 1.0 K/uL   Eosinophils Relative 2 %   Eosinophils Absolute 0.3 0.0 - 0.5 K/uL   Basophils Relative 1 %   Basophils Absolute 0.1 0.0 - 0.1 K/uL   RBC Morphology RARE NUCLEATED RED BLOOD CELLS    Immature Granulocytes 1 %   Abs Immature Granulocytes 0.12 (H) 0.00 - 0.07 K/uL   Tammy Sours Bodies PRESENT    Polychromasia MARKED    Sickle Cells MARKED    Target Cells PRESENT   Reticulocytes  Result Value Ref Range   Retic Ct Pct 15.8 (H) 0.4 - 3.1 %   RBC. 1.82 (L) 4.22 - 5.81 MIL/uL   Retic Count, Absolute 287.4 (H) 19.0 - 186.0 K/uL   Immature Retic Fract 28.6  (H) 2.3 - 15.9 %   Laboratory interpretation all normal except stable renal insufficiency, minor elevation of LFTs consistent with hemolysis and similar to prior labs, appropriately elevated reticulocytosis, anemia    EKG EKG Interpretation  Date/Time:  Thursday September 13 2018 03:35:26 EST Ventricular Rate:  113 PR Interval:    QRS Duration: 91 QT Interval:  328 QTC Calculation: 450 R Axis:   47 Text Interpretation:  Sinus tachycardia Abnormal R-wave progression, early transition Left ventricular hypertrophy Baseline wander in lead(s) V6 Electrode noise No significant change since last tracing 26 Sep 2017 Confirmed by Rolland Porter [phone]) on 09/13/2018 4:15:28 AM   Radiology Dg Chest 2 View  Result Date: 09/13/2018 CLINICAL DATA:  Shortness of breath EXAM: CHEST - 2 VIEW COMPARISON:  09/26/2017 FINDINGS: There is left paratracheal and mediastinal increased density, as well as bilateral hilar fullness, consistent with known adenopathy. No pleural effusion or pneumothorax. No focal airspace consolidation. IMPRESSION: Hilar adenopathy, unchanged.  No focal airspace disease. Electronically Signed   By: Ulyses Jarred M.D.   On: 09/13/2018 04:05    Procedures Procedures (including critical care time)  Medications Ordered in ED Medications  dextrose 5 %-0.45 % sodium chloride infusion ( Intravenous New Bag/Given 09/13/18 0443)  diphenhydrAMINE (BENADRYL) injection 25 mg (25 mg Intravenous Given 09/13/18 0444)  HYDROmorphone (DILAUDID) injection 1 mg (1 mg Intravenous Given 09/13/18 0444)  HYDROmorphone (DILAUDID) injection 1 mg (1 mg Intravenous Given 09/13/18 0526)  HYDROmorphone (DILAUDID) injection 1 mg (1 mg Intravenous Given 09/13/18 8242)     Initial Impression / Assessment and Plan / ED Course  I have reviewed the triage vital signs and the nursing notes.  Pertinent labs & imaging results that were available during my care of the patient were reviewed by me and considered in  my medical decision making (see chart for details).     Patient was started on sickle cell protocol, looking at prior visits he is has gotten either 0.5 mg of Dilaudid or 1 mg doses of Dilaudid.  He was started on IV fluids.  We discussed his x-ray result and his c-Met result.  His CBC and reticulocyte count are still pending.    Recheck at 630 patient has had 2 doses of Dilaudid.  He states his pain is improved he just has some pain in his lower legs now.  He states he feels like he will be able to be discharged home probably after the next dose of pain medication.  He does not feel like he needs to go to the sickle cell clinic  this morning at 8:00.  He is drinking fluids without difficulty.  Recheck at 730 patient states he wants to stay here and rest and get IV fluids for a little while longer.  He is not asking for more pain medication.  07:45 AM Dr Venora Maples informed patient is staying longer. Discharge papers have already been done.  Final Clinical Impressions(s) / ED Diagnoses   Final diagnoses:  Sickle cell pain crisis [facility])    ED Discharge Orders    None      Plan discharge  Rolland Porter, MD, Barbette Or, MD 09/13/18 B[place], [place], MD 09/13/18 [phone]

## 2018-09-13 NOTE — Discharge Instructions (Signed)
Drink plenty of fluids, take your medication as prescribed.  Recheck if you feel worse.

## 2018-09-13 NOTE — ED Notes (Signed)
Pt drinking water 

## 2018-09-13 NOTE — ED Notes (Signed)
Hemoglobin 6.3

## 2018-09-21 DIAGNOSIS — J342 Deviated nasal septum: Secondary | ICD-10-CM | POA: Diagnosis not present

## 2018-09-21 DIAGNOSIS — J310 Chronic rhinitis: Secondary | ICD-10-CM | POA: Diagnosis not present

## 2018-09-21 DIAGNOSIS — Z87891 Personal history of nicotine dependence: Secondary | ICD-10-CM | POA: Diagnosis not present

## 2018-09-21 DIAGNOSIS — J3489 Other specified disorders of nose and nasal sinuses: Secondary | ICD-10-CM | POA: Diagnosis not present

## 2018-09-21 DIAGNOSIS — J329 Chronic sinusitis, unspecified: Secondary | ICD-10-CM | POA: Diagnosis not present

## 2018-10-10 ENCOUNTER — Encounter: Payer: Self-pay | Admitting: Family Medicine

## 2018-10-10 ENCOUNTER — Ambulatory Visit (INDEPENDENT_AMBULATORY_CARE_PROVIDER_SITE_OTHER): Payer: Medicare HMO | Admitting: Family Medicine

## 2018-10-10 VITALS — BP 131/67 | HR 85 | Temp 97.5°F | Resp 18 | Ht 72.0 in | Wt 131.0 lb

## 2018-10-10 DIAGNOSIS — F419 Anxiety disorder, unspecified: Secondary | ICD-10-CM | POA: Diagnosis not present

## 2018-10-10 MED ORDER — HYDROXYZINE HCL 10 MG PO TABS: 10.0000 mg | ORAL_TABLET | Freq: Three times a day (TID) | ORAL | 0 refills | Status: AC | PRN

## 2018-10-10 NOTE — Progress Notes (Signed)
  [facility]   Progress Note: General Provider: Mike GipAndre Diya Gervasi, FNP  SUBJECTIVE:   Shawn Adams is a 43 y.o. male who  has a past medical history of Aneurysm (HCC), Heart murmur, Sarcoid, and Sickle cell anemia (HCC).. Patient presents today for Anxiety   Patient presents for an acute visit for anxiety. Patient states that he was seen in the ED on 09/13/2018 due to "shaking".  Patient states that he took a vitamin and drank mountain dew. Unsure if this could have increased his anxiety.  Patient states that he had an episode this week where he states that his whole body started shaking.  Patient denies suicidal ideations, intent or plan. He states that she had depression and anxiety in the past. He states that he was prescribed Elavil in the past for depression and chronic pain. Patient has seen psychiatry in the past and states that he was diagnosed with depression.  Review of Systems  Constitutional: Negative.   Eyes: Negative.   Cardiovascular: Negative.   Skin: Negative.   Neurological: Negative.   Psychiatric/Behavioral: Negative for depression, hallucinations, memory loss, substance abuse and suicidal ideas. The patient is nervous/anxious and has insomnia.      OBJECTIVE: BP 131/67 (BP Location: Right Arm, Patient Position: Sitting, Cuff Size: Normal)   Pulse 85   Temp (!) 97.5 F (36.4 C) (Oral)   Resp 18   Ht 6' (1.829 m)   Wt 131 lb (59.4 kg)   SpO2 98%   BMI 17.77 kg/m   Wt Readings from Last 3 Encounters:  10/10/18 131 lb (59.4 kg)  09/13/18 140 lb (63.5 kg)  08/06/18 128 lb (58.1 kg)     Physical Exam Constitutional:      General: He is not in acute distress.    Appearance: Normal appearance. He is normal weight.  HENT:     Head: Normocephalic and atraumatic.  Cardiovascular:     Rate and Rhythm: Normal rate and regular rhythm.  Pulmonary:     Effort: Pulmonary effort is normal.  Neurological:     Mental  Status: He is alert.  Psychiatric:        Attention and Perception: Attention normal.        Mood and Affect: Mood and affect normal.        Speech: Speech normal.        Behavior: Behavior normal. Behavior is cooperative.        Thought Content: Thought content normal.        Cognition and Memory: Cognition and memory normal.        Judgment: Judgment normal.     ASSESSMENT/PLAN:   1. [facility]  - hydrOXYzine (ATARAX/VISTARIL) 10 MG tablet; Take 1 tablet (10 mg total) by mouth 3 (three) times daily as needed for up to 30 days for anxiety.  Dispense: 30 tablet; Refill: 0    Return in about 4 weeks (around 11/07/2018) for anxiety.    The patient was given clear instructions to go to ER or return to medical center if symptoms do not improve, worsen or new problems develop. The patient verbalized understanding and agreed with plan of care.   Ms. Freda Jacksonndr L. Riley Lamouglas, FNP-BC [facility] [address]  [place], [place] [address] [phone]

## 2018-10-10 NOTE — Patient Instructions (Signed)
I recommend you go to this clinic for further evaluation of your anxiety.  [facility] [address] [place]  [phone]  Hydroxyzine capsules or tablets What is this medicine? HYDROXYZINE (hye DROX i zeen) is an antihistamine. This medicine is used to treat allergy symptoms. It is also used to treat anxiety and tension. This medicine can be used with other medicines to induce sleep before surgery. This medicine may be used for other purposes; ask your health care provider or pharmacist if you have questions. COMMON BRAND NAME(S): ANX, Atarax, Rezine, Vistaril What should I tell my health care provider before I take this medicine? They need to know if you have any of these conditions: -glaucoma -heart disease -history of irregular heartbeat -kidney disease -liver disease -lung or breathing disease, like asthma -stomach or intestine problems -thyroid disease -trouble passing urine -an unusual or allergic reaction to hydroxyzine, cetirizine, other medicines, foods, dyes or preservatives -pregnant or trying to get pregnant -breast-feeding How should I use this medicine? Take this medicine by mouth with a full glass of water. Follow the directions on the prescription label. You may take this medicine with food or on an empty stomach. Take your medicine at regular intervals. Do not take your medicine more often than directed. Talk to your pediatrician regarding the use of this medicine in children. Special care may be needed. While this drug may be prescribed for children as young as 28 years of age for selected conditions, precautions do apply. Patients over 24 years old may have a stronger reaction and need a smaller dose. Overdosage: If you think you have taken too much of this medicine contact a poison control center or emergency room at once. NOTE: This medicine is only for you. Do not share this medicine with others. What if I miss a  dose? If you miss a dose, take it as soon as you can. If it is almost time for your next dose, take only that dose. Do not take double or extra doses. What may interact with this medicine? Do not take this medicine with any of the following medications: -cisapride -dofetilide -dronedarone -pimozide -thioridazine This medicine may also interact with the following medications: -alcohol -antihistamines for allergy, cough, and cold -atropine -barbiturate medicines for sleep or seizures, like phenobarbital -certain antibiotics like erythromycin or clarithromycin -certain medicines for anxiety or sleep -certain medicines for bladder problems like oxybutynin, tolterodine -certain medicines for depression or psychotic disturbances -certain medicines for irregular heart beat -certain medicines for Parkinson's disease like benztropine, trihexyphenidyl -certain medicines for seizures like phenobarbital, primidone -certain medicines for stomach problems like dicyclomine, hyoscyamine -certain medicines for travel sickness like scopolamine -ipratropium -narcotic medicines for pain -other medicines that prolong the QT interval (an abnormal heart rhythm) This list may not describe all possible interactions. Give your health care provider a list of all the medicines, herbs, non-prescription drugs, or dietary supplements you use. Also tell them if you smoke, drink alcohol, or use illegal drugs. Some items may interact with your medicine. What should I watch for while using this medicine? Tell your doctor or health care professional if your symptoms do not improve. You may get drowsy or dizzy. Do not drive, use machinery, or do anything that needs mental alertness until you know how this medicine affects you. Do not stand or sit up quickly, especially if you are an older patient. This reduces the risk of dizzy or fainting spells. Alcohol may interfere with the  effect of this medicine. Avoid alcoholic  drinks. Your mouth may get dry. Chewing sugarless gum or sucking hard candy, and drinking plenty of water may help. Contact your doctor if the problem does not go away or is severe. This medicine may cause dry eyes and blurred vision. If you wear contact lenses you may feel some discomfort. Lubricating drops may help. See your eye doctor if the problem does not go away or is severe. If you are receiving skin tests for allergies, tell your doctor you are using this medicine. What side effects may I notice from receiving this medicine? Side effects that you should report to your doctor or health care professional as soon as possible: -allergic reactions like skin rash, itching or hives, swelling of the face, lips, or tongue -changes in vision -confusion -fast, irregular heartbeat -seizures -tremor -trouble passing urine or change in the amount of urine Side effects that usually do not require medical attention (report to your doctor or health care professional if they continue or are bothersome): -constipation -drowsiness -dry mouth -headache -tiredness This list may not describe all possible side effects. Call your doctor for medical advice about side effects. You may report side effects to FDA at 1-800-FDA-1088. Where should I keep my medicine? Keep out of the reach of children. Store at room temperature between 15 and 30 degrees C (59 and 86 degrees F). Keep container tightly closed. Throw away any unused medicine after the expiration date. NOTE: This sheet is a summary. It may not cover all possible information. If you have questions about this medicine, talk to your doctor, pharmacist, or health care provider.  2019 Elsevier/Gold Standard (2018-03-26 13:25:13)  Closes 5 PM    Generalized Anxiety Disorder, Adult Generalized anxiety disorder (GAD) is a mental health disorder. People with this condition constantly worry about everyday events. Unlike normal anxiety, worry related to GAD  is not triggered by a specific event. These worries also do not fade or get better with time. GAD interferes with life functions, including relationships, work, and school. GAD can vary from mild to severe. People with severe GAD can have intense waves of anxiety with physical symptoms (panic attacks). What are the causes? The exact cause of GAD is not known. What increases the risk? This condition is more likely to develop in:  Women.  People who have a family history of anxiety disorders.  People who are very shy.  People who experience very stressful life events, such as the death of a loved one.  People who have a very stressful family environment. What are the signs or symptoms? People with GAD often worry excessively about many things in their lives, such as their health and family. They may also be overly concerned about:  Doing well at work.  Being on time.  Natural disasters.  Friendships. Physical symptoms of GAD include:  Fatigue.  Muscle tension or having muscle twitches.  Trembling or feeling shaky.  Being easily startled.  Feeling like your heart is pounding or racing.  Feeling out of breath or like you cannot take a deep breath.  Having trouble falling asleep or staying asleep.  Sweating.  Nausea, diarrhea, or irritable bowel syndrome (IBS).  Headaches.  Trouble concentrating or remembering facts.  Restlessness.  Irritability. How is this diagnosed? Your health care provider can diagnose GAD based on your symptoms and medical history. You will also have a physical exam. The health care provider will ask specific questions about your symptoms, including how severe they  are, when they started, and if they come and go. Your health care provider may ask you about your use of alcohol or drugs, including prescription medicines. Your health care provider may refer you to a mental health specialist for further evaluation. Your health care provider will do  a thorough examination and may perform additional tests to rule out other possible causes of your symptoms. To be diagnosed with GAD, a person must have anxiety that:  Is out of his or her control.  Affects several different aspects of his or her life, such as work and relationships.  Causes distress that makes him or her unable to take part in normal activities.  Includes at least three physical symptoms of GAD, such as restlessness, fatigue, trouble concentrating, irritability, muscle tension, or sleep problems. Before your health care provider can confirm a diagnosis of GAD, these symptoms must be present more days than they are not, and they must last for six months or longer. How is this treated? The following therapies are usually used to treat GAD:  Medicine. Antidepressant medicine is usually prescribed for long-term daily control. Antianxiety medicines may be added in severe cases, especially when panic attacks occur.  Talk therapy (psychotherapy). Certain types of talk therapy can be helpful in treating GAD by providing support, education, and guidance. Options include: ? Cognitive behavioral therapy (CBT). People learn coping skills and techniques to ease their anxiety. They learn to identify unrealistic or negative thoughts and behaviors and to replace them with positive ones. ? Acceptance and commitment therapy (ACT). This treatment teaches people how to be mindful as a way to cope with unwanted thoughts and feelings. ? Biofeedback. This process trains you to manage your body's response (physiological response) through breathing techniques and relaxation methods. You will work with a therapist while machines are used to monitor your physical symptoms.  Stress management techniques. These include yoga, meditation, and exercise. A mental health specialist can help determine which treatment is best for you. Some people see improvement with one type of therapy. However, other people  require a combination of therapies. Follow these instructions at home:  Take over-the-counter and prescription medicines only as told by your health care provider.  Try to maintain a normal routine.  Try to anticipate stressful situations and allow extra time to manage them.  Practice any stress management or self-calming techniques as taught by your health care provider.  Do not punish yourself for setbacks or for not making progress.  Try to recognize your accomplishments, even if they are small.  Keep all follow-up visits as told by your health care provider. This is important. Contact a health care provider if:  Your symptoms do not get better.  Your symptoms get worse.  You have signs of depression, such as: ? A persistently sad, cranky, or irritable mood. ? Loss of enjoyment in activities that used to bring you joy. ? Change in weight or eating. ? Changes in sleeping habits. ? Avoiding friends or family members. ? Loss of energy for normal tasks. ? Feelings of guilt or worthlessness. Get help right away if:  You have serious thoughts about hurting yourself or others. If you ever feel like you may hurt yourself or others, or have thoughts about taking your own life, get help right away. You can go to your nearest emergency department or call:  Your local emergency services (911 in the [place]  A suicide crisis helpline, such as the National Suicide Prevention Lifeline at (510)789-89681-618 268 1434. This is open  24 hours a day. Summary  Generalized anxiety disorder (GAD) is a mental health disorder that involves worry that is not triggered by a specific event.  People with GAD often worry excessively about many things in their lives, such as their health and family.  GAD may cause physical symptoms such as restlessness, trouble concentrating, sleep problems, frequent sweating, nausea, diarrhea, headaches, and trembling or muscle twitching.  A mental health specialist can help  determine which treatment is best for you. Some people see improvement with one type of therapy. However, other people require a combination of therapies. This information is not intended to replace advice given to you by your health care provider. Make sure you discuss any questions you have with your health care provider. Document Released: 01/07/2013 Document Revised: 08/02/2016 Document Reviewed: 08/02/2016 Elsevier Interactive Patient Education  2019 [employer].

## 2018-11-06 ENCOUNTER — Emergency Department (HOSPITAL_COMMUNITY)
Admission: EM | Admit: 2018-11-06 | Discharge: 2018-11-06 | Disposition: A | Discharge status: Home / Self Care | Payer: Medicare HMO | Attending: Emergency Medicine | Admitting: Emergency Medicine

## 2018-11-06 ENCOUNTER — Encounter (HOSPITAL_COMMUNITY): Payer: Self-pay

## 2018-11-06 DIAGNOSIS — D5700 Hb-SS disease with crisis, unspecified: Secondary | ICD-10-CM | POA: Insufficient documentation

## 2018-11-06 DIAGNOSIS — Z5321 Procedure and treatment not carried out due to patient leaving prior to being seen by health care provider: Secondary | ICD-10-CM | POA: Insufficient documentation

## 2018-11-06 NOTE — ED Notes (Signed)
Pt called from triage with no answer 

## 2018-11-06 NOTE — ED Triage Notes (Signed)
Pt complains of all over sickle cell pain since 1am

## 2018-11-06 NOTE — ED Notes (Signed)
Bed: WA04 Expected date:  Expected time:  Means of arrival:  Comments: 

## 2018-11-07 ENCOUNTER — Ambulatory Visit: Payer: Medicaid Other | Admitting: Family Medicine

## 2019-02-06 ENCOUNTER — Emergency Department (HOSPITAL_COMMUNITY): Payer: Medicare HMO

## 2019-02-06 ENCOUNTER — Other Ambulatory Visit: Payer: Self-pay

## 2019-02-06 ENCOUNTER — Emergency Department (HOSPITAL_COMMUNITY)
Admission: EM | Admit: 2019-02-06 | Discharge: 2019-02-06 | Disposition: A | Discharge status: Home / Self Care | Payer: Medicare HMO | Attending: Emergency Medicine | Admitting: Emergency Medicine

## 2019-02-06 DIAGNOSIS — N183 Chronic kidney disease, stage 3 (moderate): Secondary | ICD-10-CM | POA: Diagnosis not present

## 2019-02-06 DIAGNOSIS — Z87891 Personal history of nicotine dependence: Secondary | ICD-10-CM | POA: Diagnosis not present

## 2019-02-06 DIAGNOSIS — D5700 Hb-SS disease with crisis, unspecified: Secondary | ICD-10-CM | POA: Insufficient documentation

## 2019-02-06 DIAGNOSIS — Z79899 Other long term (current) drug therapy: Secondary | ICD-10-CM | POA: Insufficient documentation

## 2019-02-06 DIAGNOSIS — J45909 Unspecified asthma, uncomplicated: Secondary | ICD-10-CM | POA: Diagnosis not present

## 2019-02-06 DIAGNOSIS — R52 Pain, unspecified: Secondary | ICD-10-CM | POA: Diagnosis present

## 2019-02-06 LAB — CBC WITH DIFFERENTIAL/PLATELET
Abs Immature Granulocytes: 0.06 10*3/uL (ref 0.00–0.07)
Basophils Absolute: 0 10*3/uL (ref 0.0–0.1)
Basophils Relative: 1 %
Eosinophils Absolute: 0.3 10*3/uL (ref 0.0–0.5)
Eosinophils Relative: 4 %
HCT: 15.6 % — ABNORMAL LOW (ref 39.0–52.0)
Hemoglobin: 5.8 g/dL — CL (ref 13.0–17.0)
Immature Granulocytes: 1 %
Lymphocytes Relative: 30 %
Lymphs Abs: 2.3 10*3/uL (ref 0.7–4.0)
MCH: 37.2 pg — ABNORMAL HIGH (ref 26.0–34.0)
MCHC: 37.2 g/dL — ABNORMAL HIGH (ref 30.0–36.0)
MCV: 100 fL (ref 80.0–100.0)
Monocytes Absolute: 0.5 10*3/uL (ref 0.1–1.0)
Monocytes Relative: 7 %
Neutro Abs: 4.5 10*3/uL (ref 1.7–7.7)
Neutrophils Relative %: 57 %
Platelets: 402 10*3/uL — ABNORMAL HIGH (ref 150–400)
RBC: 1.56 MIL/uL — ABNORMAL LOW (ref 4.22–5.81)
RDW: 23.1 % — ABNORMAL HIGH (ref 11.5–15.5)
WBC: 7.8 10*3/uL (ref 4.0–10.5)
nRBC: 5 % — ABNORMAL HIGH (ref 0.0–0.2)

## 2019-02-06 LAB — RETICULOCYTES
Immature Retic Fract: 32.7 % — ABNORMAL HIGH (ref 2.3–15.9)
RBC.: 1.56 MIL/uL — ABNORMAL LOW (ref 4.22–5.81)
Retic Count, Absolute: 270.7 10*3/uL — ABNORMAL HIGH (ref 19.0–186.0)
Retic Ct Pct: 17.4 % — ABNORMAL HIGH (ref 0.4–3.1)

## 2019-02-06 LAB — COMPREHENSIVE METABOLIC PANEL
ALT: 34 U/L (ref 0–44)
AST: 64 U/L — ABNORMAL HIGH (ref 15–41)
Albumin: 3.9 g/dL (ref 3.5–5.0)
Alkaline Phosphatase: 123 U/L (ref 38–126)
Anion gap: 5 (ref 5–15)
BUN: 21 mg/dL — ABNORMAL HIGH (ref 6–20)
CO2: 20 mmol/L — ABNORMAL LOW (ref 22–32)
Calcium: 9 mg/dL (ref 8.9–10.3)
Chloride: 115 mmol/L — ABNORMAL HIGH (ref 98–111)
Creatinine, Ser: 1.53 mg/dL — ABNORMAL HIGH (ref 0.61–1.24)
GFR calc Af Amer: >60 mL/min (ref >60)
GFR calc non Af Amer: 55 mL/min — ABNORMAL LOW (ref >60)
Glucose, Bld: 100 mg/dL — ABNORMAL HIGH (ref 70–99)
Potassium: 3.8 mmol/L (ref 3.5–5.1)
Sodium: 140 mmol/L (ref 135–145)
Total Bilirubin: 2.1 mg/dL — ABNORMAL HIGH (ref 0.3–1.2)
Total Protein: 7.7 g/dL (ref 6.5–8.1)

## 2019-02-06 MED ORDER — HYDROMORPHONE HCL 2 MG/ML IJ SOLN: 2.0000 mg | INTRAMUSCULAR | Status: AC

## 2019-02-06 MED ORDER — HYDROMORPHONE HCL 2 MG/ML IJ SOLN: 2.0000 mg | INTRAMUSCULAR | Status: DC

## 2019-02-06 MED ORDER — HYDROMORPHONE HCL 2 MG/ML IJ SOLN
2.0000 mg | INTRAMUSCULAR | Status: AC
  Administered 2019-02-06: 2 mg via INTRAVENOUS
  Filled 2019-02-06: qty 1

## 2019-02-06 MED ORDER — HYDROMORPHONE HCL 2 MG/ML IJ SOLN
2.0000 mg | INTRAMUSCULAR | Status: AC
  Administered 2019-02-06: 22:00:00 2 mg via INTRAVENOUS
  Filled 2019-02-06: qty 1

## 2019-02-06 MED ORDER — HYDROMORPHONE HCL 2 MG/ML IJ SOLN
2.0000 mg | INTRAMUSCULAR | Status: AC
  Administered 2019-02-06: 21:00:00 2 mg via INTRAVENOUS
  Filled 2019-02-06: qty 1

## 2019-02-06 MED ORDER — SODIUM CHLORIDE 0.45 % IV SOLN
INTRAVENOUS | Status: DC
  Administered 2019-02-06: 21:00:00 via INTRAVENOUS

## 2019-02-06 NOTE — ED Notes (Signed)
CRITICAL VALUE STICKER  CRITICAL VALUE:  RECEIVER (on-site recipient of call): Leandria Thier, RN  DATE & TIME NOTIFIED: 5/13 2139  MD NOTIFIED: Dr. Ranae Palms  TIME OF NOTIFICATION: 2142  HGB 5.8

## 2019-02-06 NOTE — ED Triage Notes (Addendum)
Pt reports with sickle cell pain in his left hip and lower back for the past 3 days. Has tried tylenol and oxycodone at home without relief. Last doses were at 1500. Reports some chest pain but says he thinks that's from his sarcoidosis and not sickle cell.

## 2019-02-06 NOTE — ED Provider Notes (Signed)
[facility]-EMERGENCY DEPT Provider Note   CSN: [id number] Arrival date & time: 02/06/19  1929    History   Chief Complaint Chief Complaint  Patient presents with  . Sickle Cell Pain Crisis    HPI Tally Mckinnon is a 43 y.o. male with past medical history of sickle cell anemia, CKD, sarcoidosis, anemia of chronic disease, presenting to the emergency department with complaint of sickle cell pain that began 3 days ago.  Pain is located to the left hip and low back, feels similar to sickle cell pain.  He has been treating with home oxycodone and Tylenol without significant relief.  He does endorse some mild right-sided chest pain, however this feels similar to history of sarcoidosis.  He states he does not feel similar to acute chest syndrome.  He denies cough, shortness of breath, fever, abdominal symptoms, urinary symptoms.  New weakness or fatigue.  His threshold for transfusion is a hemoglobin of 5.3 or less.  He states his hemoglobin is normally around 6.  He is followed by hematology at [facility].     The history is provided by the patient.    Past Medical History:  Diagnosis Date  . Aneurysm (HCC)    Pt states happened in 2011, Brain   . Heart murmur   . Sarcoid   . Sickle cell anemia [facility])     Patient Active Problem List   Diagnosis Date Noted  . Chronic pain syndrome   . Sarcoidosis 07/06/2018  . Persistent asthma without complication 07/06/2018  . Headache 05/03/2016  . Anemia of chronic disease 05/03/2016  . Headache disorder   . Vitamin D deficiency 02/09/2016  . Heart murmur 02/09/2016  . Recurrent occipital headache 10/15/2015  . History of aneurysm 10/13/2015  . Left hip pain 07/30/2015  . Left ankle pain 07/08/2015  . Sickle cell crisis (HCC) 03/08/2015  . 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 Sickle cell anemia with crisis (HCC) 12/07/2014  . Sickle cell anemia (HCC) 06/20/2014  . CKD (chronic kidney disease) stage 3, GFR  30-59 ml/min (HCC) 06/20/2014  . Leukocytosis 06/20/2014  . Sickle cell pain crisis (HCC) 06/20/2014  . Hypercalcemia 06/20/2014  . Symptomatic anemia 05/31/2014    Past Surgical History:  Procedure Laterality Date  . LYMPH NODE BIOPSY          Home Medications    Prior to Admission medications   Medication Sig Start Date End Date Taking? Authorizing Provider  acetaminophen (TYLENOL) 500 MG tablet Take 1,000 mg by mouth every 6 (six) hours as needed for moderate pain or fever (pain).    Yes [provider]  albuterol (PROVENTIL HFA;VENTOLIN HFA) 108 (90 Base) MCG/ACT inhaler Inhale 2 puffs into the lungs every 4 (four) hours as needed for wheezing or shortness of breath (cough, shortness of breath or wheezing.). 09/21/17  Yes Massie Maroon, FNP  Ascorbic Acid (VITAMIN C) 1000 MG tablet Take 1,000 mg by mouth daily.   Yes [provider]  budesonide-formoterol (SYMBICORT) 160-4.5 MCG/ACT inhaler Inhale 2 puffs into the lungs 2 (two) times daily. 05/10/18 05/10/19 Yes [provider]  cyclobenzaprine (FLEXERIL) 10 MG tablet Take 1 tablet (10 mg total) by mouth 3 (three) times daily as needed for muscle spasms. 04/09/18  Yes Street, [place], PA-C  DOK 100 MG capsule Take 100 mg by mouth daily as needed for mild constipation.  11/08/18  Yes [provider]  famotidine (PEPCID) 10 MG tablet Take 10 mg by  mouth daily as needed for heartburn or indigestion.   Yes [provider]  folic acid (FOLVITE) 1 MG tablet Take 1 tablet (1 mg total) by mouth daily. 02/09/16  Yes Quentin AngstJegede, [name], MD  hydroxyurea (HYDREA) 500 MG capsule Take 2 capsules (1,000 mg total) by mouth daily. May take with food to minimize GI side effects. Patient taking differently: Take 500 mg by mouth 4 (four) times daily. May take with food to minimize GI side effects. 02/09/16  Yes Quentin AngstJegede, [name], MD  Melatonin 10 MG TABS Take 10 mg by mouth at bedtime.   Yes [provider]  ondansetron (ZOFRAN) 4 MG tablet Take 1 tablet (4 mg total) by mouth every 6 (six) hours. Patient taking differently: Take 4 mg by mouth every 8 (eight) hours as needed for nausea or vomiting.  12/21/15  Yes Hedges, Tinnie GensJeffrey, PA-C  Oxycodone HCl 20 MG TABS Take 20 mg by mouth every 4 (four) hours as needed (pain).    Yes [provider]  prednisoLONE acetate (PRED FORTE) 1 % ophthalmic suspension See admin instructions. Apply 3 drops to each nostril in head-hanging position twice daily 11/26/18  Yes [provider]  vitamin B-12 (CYANOCOBALAMIN) 500 MCG tablet Take 500 mcg by mouth daily.   Yes [provider]  naproxen (NAPROSYN) 500 MG tablet Take 1 tablet (500 mg total) by mouth 2 (two) times daily as needed for mild pain, moderate pain or headache (TAKE WITH MEALS.). Patient not taking: Reported on 02/06/2019 04/09/18   Street, [place], PA-C    Family History Family History  Problem Relation Age of Onset  . Diabetes Mother   . Hypertension Father   . Lupus Sister     Social History Social History   Tobacco Use  . Smoking status: Former Smoker    Types: Cigarettes  . Smokeless tobacco: Never Used  Substance Use Topics  . Alcohol use: Yes    Alcohol/week: 1.0 standard drinks    Types: 1 Cans of beer per week    Comment: occasionally  . Drug use: Not Currently    Types: Marijuana     Allergies   Patient has no known allergies.   Review of Systems Review of Systems  All other systems reviewed and are negative.    Physical Exam Updated Vital Signs BP 140/72   Pulse 80   Temp 98.5 F (36.9 C) (Oral)   Resp 20   Ht 6' (1.829 m)   Wt 63.5 kg   SpO2 94%   BMI 18.99 kg/m   Physical Exam Vitals signs and nursing note reviewed.  Constitutional:      General: He is not in acute distress.    Appearance: He is well-developed. He is not ill-appearing.  HENT:     Head: Normocephalic and atraumatic.  Eyes:      Conjunctiva/sclera: Conjunctivae normal.  Cardiovascular:     Rate and Rhythm: Normal rate and regular rhythm.  Pulmonary:     Effort: Pulmonary effort is normal. No respiratory distress.     Breath sounds: Normal breath sounds.  Chest:     Chest wall: No tenderness.  Abdominal:     General: Bowel sounds are normal. There is no distension.     Palpations: Abdomen is soft.  Musculoskeletal:     Right lower leg: No edema.     Left lower leg: No edema.  Skin:    General: Skin is warm.  Neurological:     Mental Status:  He is alert.  Psychiatric:        Behavior: Behavior normal.      ED Treatments / Results  Labs (all labs ordered are listed, but only abnormal results are displayed) Labs Reviewed  CBC WITH DIFFERENTIAL/PLATELET - Abnormal; Notable for the following components:      Result Value   RBC 1.56 (*)    Hemoglobin 5.8 (*)    HCT 15.6 (*)    MCH 37.2 (*)    MCHC 37.2 (*)    RDW 23.1 (*)    Platelets 402 (*)    nRBC 5.0 (*)    All other components within normal limits  RETICULOCYTES - Abnormal; Notable for the following components:   Retic Ct Pct 17.4 (*)    RBC. 1.56 (*)    Retic Count, Absolute 270.7 (*)    Immature Retic Fract 32.7 (*)    All other components within normal limits  COMPREHENSIVE METABOLIC PANEL - Abnormal; Notable for the following components:   Chloride 115 (*)    CO2 20 (*)    Glucose, Bld 100 (*)    BUN 21 (*)    Creatinine, Ser 1.53 (*)    AST 64 (*)    Total Bilirubin 2.1 (*)    GFR calc non Af Amer 55 (*)    All other components within normal limits  URINALYSIS, ROUTINE W REFLEX MICROSCOPIC    EKG None  Radiology Dg Chest 2 View  Result Date: 02/06/2019 CLINICAL DATA:  History of sickle cell disease and sarcoidosis. Chest pain and shortness of breath. EXAM: CHEST - 2 VIEW COMPARISON:  CT chest 09/26/2017.  PA and lateral chest 09/13/2018. FINDINGS: Lungs are clear. Heart size is mildly enlarged. Mediastinal and hilar  lymphadenopathy appears unchanged. No pneumothorax or pleural effusion. No acute or focal bony abnormality. IMPRESSION: No acute disease. No change in mediastinal and hilar lymphadenopathy consistent with history of sarcoidosis. Electronically Signed   By: Drusilla Kanner M.D.   On: 02/06/2019 21:11    Procedures Procedures (including critical care time)  Medications Ordered in ED Medications  0.45 % sodium chloride infusion ( Intravenous Stopped 02/06/19 2225)  HYDROmorphone (DILAUDID) injection 2 mg (has no administration in time range)    Or  HYDROmorphone (DILAUDID) injection 2 mg (has no administration in time range)  HYDROmorphone (DILAUDID) injection 2 mg (2 mg Intravenous Given 02/06/19 2035)    Or  HYDROmorphone (DILAUDID) injection 2 mg ( Subcutaneous See Alternative 02/06/19 2035)  HYDROmorphone (DILAUDID) injection 2 mg (2 mg Intravenous Given 02/06/19 2107)    Or  HYDROmorphone (DILAUDID) injection 2 mg ( Subcutaneous See Alternative 02/06/19 2107)  HYDROmorphone (DILAUDID) injection 2 mg (2 mg Intravenous Given 02/06/19 2149)    Or  HYDROmorphone (DILAUDID) injection 2 mg ( Subcutaneous See Alternative 02/06/19 2149)     Initial Impression / Assessment and Plan / ED Course  I have reviewed the triage vital signs and the nursing notes.  Pertinent labs & imaging results that were available during my care of the patient were reviewed by me and considered in my medical decision making (see chart for details).        Patient presenting with sickle cell crisis. Patient with typical symptoms and pain. Pt with mild CP which he attributes to his sx related to sarcoidosis. No abdominal pain, fever, or SOB. CXR is negative. Pt is not exhibiting signs or symptoms of acute chest syndrome, organ failure, priapism, or DVT. Hgb is 5.8, pt reports his  threshold for transfusion is 5.3 Pt is afebrile, hemodynamically stable. WBC wnl. Retic appropriately elevated. Hgb without change from  baseline. Patient treated with sickle cell protocol, with improvement in symptoms. Pt tolerating PO. Pt is safe for discharge with symptomatic management. Instructed he follow up closely with PCP/hematologist for hgb recheck. Strict return precautions discussed.  Discussed patient with Dr. Ranae Palms, who agrees with workup and care plan.  Discussed results, findings, treatment and follow up. Patient advised of return precautions. Patient verbalized understanding and agreed with plan.  Final Clinical Impressions(s) / ED Diagnoses   Final diagnoses:  Sickle cell pain crisis [facility])    ED Discharge Orders    None       Davaris Youtsey, [place] N, PA-C 02/06/19 2315    Loren Racer, MD 02/07/19 2109

## 2019-02-06 NOTE — ED Notes (Signed)
ED Provider at bedside. 

## 2019-02-06 NOTE — Discharge Instructions (Signed)
Your hemoglobin today is 5.8. Be sure to follow up with your hematologist for recheck. Take your pain medications as prescribed, as needed. Return to the ER for chest pain, shortness of breath, fever, or new or concerning symptoms.

## 2019-02-11 ENCOUNTER — Ambulatory Visit: Payer: Medicare HMO | Admitting: Family Medicine

## 2019-04-12 ENCOUNTER — Other Ambulatory Visit: Payer: Self-pay

## 2019-04-12 ENCOUNTER — Emergency Department (HOSPITAL_COMMUNITY): Payer: Medicare HMO

## 2019-04-12 ENCOUNTER — Emergency Department (HOSPITAL_COMMUNITY)
Admission: EM | Admit: 2019-04-12 | Discharge: 2019-04-12 | Disposition: A | Discharge status: Home / Self Care | Payer: Medicare HMO | Attending: Emergency Medicine | Admitting: Emergency Medicine

## 2019-04-12 DIAGNOSIS — D5700 Hb-SS disease with crisis, unspecified: Secondary | ICD-10-CM

## 2019-04-12 DIAGNOSIS — Z87891 Personal history of nicotine dependence: Secondary | ICD-10-CM | POA: Diagnosis not present

## 2019-04-12 DIAGNOSIS — Z79899 Other long term (current) drug therapy: Secondary | ICD-10-CM | POA: Diagnosis not present

## 2019-04-12 DIAGNOSIS — D571 Sickle-cell disease without crisis: Secondary | ICD-10-CM | POA: Diagnosis not present

## 2019-04-12 DIAGNOSIS — M549 Dorsalgia, unspecified: Secondary | ICD-10-CM | POA: Diagnosis present

## 2019-04-12 LAB — CBC WITH DIFFERENTIAL/PLATELET
Abs Immature Granulocytes: 0.34 10*3/uL — ABNORMAL HIGH (ref 0.00–0.07)
Basophils Absolute: 0 10*3/uL (ref 0.0–0.1)
Basophils Relative: 0 %
Eosinophils Absolute: 0 10*3/uL (ref 0.0–0.5)
Eosinophils Relative: 0 %
HCT: 18.2 % — ABNORMAL LOW (ref 39.0–52.0)
Hemoglobin: 6.7 g/dL — CL (ref 13.0–17.0)
Immature Granulocytes: 5 %
Lymphocytes Relative: 7 %
Lymphs Abs: 0.5 10*3/uL — ABNORMAL LOW (ref 0.7–4.0)
MCH: 37.6 pg — ABNORMAL HIGH (ref 26.0–34.0)
MCHC: 36.8 g/dL — ABNORMAL HIGH (ref 30.0–36.0)
MCV: 102.2 fL — ABNORMAL HIGH (ref 80.0–100.0)
Monocytes Absolute: 0.1 10*3/uL (ref 0.1–1.0)
Monocytes Relative: 2 %
Neutro Abs: 5.8 10*3/uL (ref 1.7–7.7)
Neutrophils Relative %: 86 %
Platelets: 343 10*3/uL (ref 150–400)
RBC: 1.78 MIL/uL — ABNORMAL LOW (ref 4.22–5.81)
RDW: 19.2 % — ABNORMAL HIGH (ref 11.5–15.5)
WBC: 6.8 10*3/uL (ref 4.0–10.5)
nRBC: 5.5 % — ABNORMAL HIGH (ref 0.0–0.2)

## 2019-04-12 LAB — BASIC METABOLIC PANEL
Anion gap: 9 (ref 5–15)
BUN: 28 mg/dL — ABNORMAL HIGH (ref 6–20)
CO2: 16 mmol/L — ABNORMAL LOW (ref 22–32)
Calcium: 8.5 mg/dL — ABNORMAL LOW (ref 8.9–10.3)
Chloride: 112 mmol/L — ABNORMAL HIGH (ref 98–111)
Creatinine, Ser: 1.43 mg/dL — ABNORMAL HIGH (ref 0.61–1.24)
GFR calc Af Amer: >60 mL/min (ref >60)
GFR calc non Af Amer: 60 mL/min — ABNORMAL LOW (ref >60)
Glucose, Bld: 123 mg/dL — ABNORMAL HIGH (ref 70–99)
Potassium: 4 mmol/L (ref 3.5–5.1)
Sodium: 137 mmol/L (ref 135–145)

## 2019-04-12 LAB — RETICULOCYTES
Immature Retic Fract: 20 % — ABNORMAL HIGH (ref 2.3–15.9)
RBC.: 1.78 MIL/uL — ABNORMAL LOW (ref 4.22–5.81)
Retic Count, Absolute: 376 10*3/uL — ABNORMAL HIGH (ref 19.0–186.0)
Retic Ct Pct: 19.3 % — ABNORMAL HIGH (ref 0.4–3.1)

## 2019-04-12 MED ORDER — HYDROMORPHONE HCL 2 MG/ML IJ SOLN: 2.0000 mg | INTRAMUSCULAR | Status: DC

## 2019-04-12 MED ORDER — HYDROMORPHONE HCL 2 MG/ML IJ SOLN: 2.0000 mg | INTRAMUSCULAR | Status: AC

## 2019-04-12 MED ORDER — HYDROMORPHONE HCL 2 MG/ML IJ SOLN
2.0000 mg | INTRAMUSCULAR | Status: AC
  Administered 2019-04-12: 2 mg via INTRAVENOUS
  Filled 2019-04-12: qty 1

## 2019-04-12 MED ORDER — DIPHENHYDRAMINE HCL 50 MG/ML IJ SOLN
25.0000 mg | Freq: Once | INTRAMUSCULAR | Status: DC | PRN
  Filled 2019-04-12: qty 1

## 2019-04-12 MED ORDER — SODIUM CHLORIDE 0.45 % IV SOLN
INTRAVENOUS | Status: DC
  Administered 2019-04-12: 04:00:00 via INTRAVENOUS

## 2019-04-12 MED ORDER — ONDANSETRON HCL 4 MG/2ML IJ SOLN: 4.0000 mg | INTRAMUSCULAR | Status: DC | PRN

## 2019-04-12 NOTE — ED Notes (Signed)
Pt states pain is 0/10 and does not want second round of medication. EDP notified.

## 2019-04-12 NOTE — ED Notes (Signed)
Pt transported to Xray. 

## 2019-04-12 NOTE — ED Provider Notes (Signed)
[facility]-EMERGENCY DEPT Provider Note: Shawn DellJ. Lane Corleen Otwell, MD, FACEP  CSN: [id number] MRN: [id number] ARRIVAL: 04/12/19 at 0120 ROOM: WA20/[facility]20   CHIEF COMPLAINT  Sickle Cell Pain Crisis   HISTORY OF PRESENT ILLNESS  04/12/19 4:02 AM Shawn DollarLuke Lirette is a 43 y.o. male with sickle cell disease.  He is here with a sickle cell pain crisis.  His pain is located in his back and radiates to his abdomen, chest and left hip.  The pattern of his pain is similar to previous sickle cell crises.  He attributes this episode to sinus surgery 3 days ago.  He rated the pain as an 8 out of 10 on arrival.  He took some Tylenol and while waiting for a room his pain improved.  He now rates it as a 3 out of 10.  He has not been febrile.  He has had some baseline shortness of breath and cough for 1 day.  He denies nausea, vomiting or diarrhea.   Past Medical History:  Diagnosis Date  . Aneurysm (HCC)    Pt states happened in 2011, Brain   . Heart murmur   . Sarcoid   . Sickle cell anemia (HCC)     Past Surgical History:  Procedure Laterality Date  . LYMPH NODE BIOPSY      Family History  Problem Relation Age of Onset  . Diabetes Mother   . Hypertension Father   . Lupus Sister     Social History   Tobacco Use  . Smoking status: Former Smoker    Types: Cigarettes  . Smokeless tobacco: Never Used  Substance Use Topics  . Alcohol use: Yes    Alcohol/week: 1.0 standard drinks    Types: 1 Cans of beer per week    Comment: occasionally  . Drug use: Not Currently    Types: Marijuana    Prior to Admission medications   Medication Sig Start Date End Date Taking? Authorizing Provider  acetaminophen (TYLENOL) 500 MG tablet Take 1,000 mg by mouth every 6 (six) hours as needed for moderate pain or fever (pain).    Yes [provider]  albuterol (PROVENTIL HFA;VENTOLIN HFA) 108 (90 Base) MCG/ACT inhaler Inhale 2 puffs into the lungs every 4 (four) hours as needed for wheezing or shortness of breath  (cough, shortness of breath or wheezing.). 09/21/17  Yes Massie MaroonHollis, [name], FNP  Ascorbic Acid (VITAMIN C) 1000 MG tablet Take 1,000 mg by mouth daily.   Yes [provider]  budesonide-formoterol (SYMBICORT) 160-4.5 MCG/ACT inhaler Inhale 2 puffs into the lungs 2 (two) times daily. 05/10/18 05/10/19 Yes [provider]  cephALEXin (KEFLEX) 500 MG capsule Take 500 mg by mouth 3 (three) times daily. 04/09/19  Yes [provider]  cyclobenzaprine (FLEXERIL) 10 MG tablet Take 1 tablet (10 mg total) by mouth 3 (three) times daily as needed for muscle spasms. 04/09/18  Yes Street, BensonMercedes, PA-C  DOK 100 MG capsule Take 100 mg by mouth daily as needed for mild constipation.  11/08/18  Yes [provider]  famotidine (PEPCID) 10 MG tablet Take 10 mg by mouth daily as needed for heartburn or indigestion.   Yes [provider]  folic acid (FOLVITE) 1 MG tablet Take 1 tablet (1 mg total) by mouth daily. 02/09/16  Yes [name], Phylliss Blakeslugbemiga E, MD  GINSENG PO Take 1 tablet by mouth 2 (two) times a day.   Yes [provider]  hydroxyurea (HYDREA) 500 MG capsule Take 2 capsules (1,000 mg total) by  mouth daily. May take with food to minimize GI side effects. Patient taking differently: Take 500 mg by mouth 4 (four) times daily. May take with food to minimize GI side effects. 02/09/16  Yes Quentin AngstJegede, [name], MD  Melatonin 10 MG TABS Take 10 mg by mouth at bedtime.   Yes [provider]  ondansetron (ZOFRAN) 4 MG tablet Take 1 tablet (4 mg total) by mouth every 6 (six) hours. Patient taking differently: Take 4 mg by mouth every 8 (eight) hours as needed for nausea or vomiting.  12/21/15  Yes Hedges, Tinnie GensJeffrey, PA-C  Oxycodone HCl 20 MG TABS Take 20 mg by mouth every 4 (four) hours as needed (pain).    Yes [provider]  prednisoLONE acetate (PRED FORTE) 1 % ophthalmic suspension See admin instructions. Apply 3 drops to each nostril in head-hanging  position twice daily 11/26/18  Yes [provider]  predniSONE (DELTASONE) 10 MG tablet Take 40 mg by mouth daily. 04/09/19  Yes [provider]  vitamin B-12 (CYANOCOBALAMIN) 500 MCG tablet Take 500 mcg by mouth daily.   Yes [provider]  naproxen (NAPROSYN) 500 MG tablet Take 1 tablet (500 mg total) by mouth 2 (two) times daily as needed for mild pain, moderate pain or headache (TAKE WITH MEALS.). Patient not taking: Reported on 02/06/2019 04/09/18   Street, Miller PlaceMercedes, [place] JerseyPA-C    Allergies Patient has no known allergies.   REVIEW OF SYSTEMS  Negative except as noted here or in the History of Present Illness.   PHYSICAL EXAMINATION  Initial Vital Signs Blood pressure (!) 144/77, pulse (!) 49, temperature 98.2 F (36.8 C), temperature source Oral, resp. rate 15, SpO2 97 %.  Examination General: Well-developed, well-nourished male in no acute distress; appearance consistent with age of record HENT: normocephalic; atraumatic Eyes: pupils equal, round and reactive to light; extraocular muscles intact Neck: supple Heart: regular rate and rhythm; bradycardia Lungs: clear to auscultation bilaterally Abdomen: soft; nondistended; nontender; bowel sounds present Extremities: No deformity; full range of motion; pulses normal Neurologic: Awake, alert and oriented; motor function intact in all extremities and symmetric; no facial droop Skin: Warm and dry Psychiatric: Normal mood and affect   RESULTS  Summary of this visit's results, reviewed by myself:   EKG Interpretation  Date/Time:    Ventricular Rate:    PR Interval:    QRS Duration:   QT Interval:    QTC Calculation:   R Axis:     Text Interpretation:        Laboratory Studies: Results for orders placed or performed during the hospital encounter of 04/12/19 (from the past 24 hour(s))  Reticulocytes     Status: Abnormal   Collection Time: 04/12/19  4:16 AM  Result Value Ref Range   Retic Ct Pct 19.3  (H) 0.4 - 3.1 %   RBC. 1.78 (L) 4.22 - 5.81 MIL/uL   Retic Count, Absolute 376.0 (H) 19.0 - 186.0 K/uL   Immature Retic Fract 20.0 (H) 2.3 - 15.9 %  Basic metabolic panel     Status: Abnormal   Collection Time: 04/12/19  4:16 AM  Result Value Ref Range   Sodium 137 135 - 145 mmol/L   Potassium 4.0 3.5 - 5.1 mmol/L   Chloride 112 (H) 98 - 111 mmol/L   CO2 16 (L) 22 - 32 mmol/L   Glucose, Bld 123 (H) 70 - 99 mg/dL   BUN 28 (H) 6 - 20 mg/dL   Creatinine, Ser 1.191.43 (H) 0.61 -  1.24 mg/dL   Calcium 8.5 (L) 8.9 - 10.3 mg/dL   GFR calc non Af Amer 60 (L) >60 mL/min   GFR calc Af Amer >60 >60 mL/min   Anion gap 9 5 - 15  CBC WITH DIFFERENTIAL     Status: Abnormal   Collection Time: 04/12/19  4:16 AM  Result Value Ref Range   WBC 6.8 4.0 - 10.5 K/uL   RBC 1.78 (L) 4.22 - 5.81 MIL/uL   Hemoglobin 6.7 (LL) 13.0 - 17.0 g/dL   HCT 18.2 (L) 39.0 - 52.0 %   MCV 102.2 (H) 80.0 - 100.0 fL   MCH 37.6 (H) 26.0 - 34.0 pg   MCHC 36.8 (H) 30.0 - 36.0 g/dL   RDW 19.2 (H) 11.5 - 15.5 %   Platelets 343 150 - 400 K/uL   nRBC 5.5 (H) 0.0 - 0.2 %   Neutrophils Relative % 86 %   Neutro Abs 5.8 1.7 - 7.7 K/uL   Lymphocytes Relative 7 %   Lymphs Abs 0.5 (L) 0.7 - 4.0 K/uL   Monocytes Relative 2 %   Monocytes Absolute 0.1 0.1 - 1.0 K/uL   Eosinophils Relative 0 %   Eosinophils Absolute 0.0 0.0 - 0.5 K/uL   Basophils Relative 0 %   Basophils Absolute 0.0 0.0 - 0.1 K/uL   RBC Morphology HOWELL/JOLLY BODIES    Immature Granulocytes 5 %   Abs Immature Granulocytes 0.34 (H) 0.00 - 0.07 K/uL   Imaging Studies: Dg Chest 2 View  Result Date: 04/12/2019 CLINICAL DATA:  Cough EXAM: CHEST - 2 VIEW COMPARISON:  02/06/2019 FINDINGS: Chronic cardiomegaly and vascular pedicle widening from adenopathy. Hilar adenopathy is also seen on both sides. Interstitial coarsening at the bases with scarring along the lower left major fissure. There is no edema, consolidation, effusion, or pneumothorax. Blunting at the lateral  right costophrenic sulcus without pleural fluid seen posteriorly. IMPRESSION: 1. No acute finding when compared with prior. 2. Bulky adenopathy in the setting of sarcoid. Electronically Signed   By: Monte Fantasia M.D.   On: 04/12/2019 04:56    ED COURSE and MDM  Nursing notes and initial vitals signs, including pulse oximetry, reviewed.  Vitals:   04/12/19 0330 04/12/19 0400 04/12/19 0430 04/12/19 0500  BP: (!) 144/77 136/78 (!) 141/77 131/73  Pulse: (!) 49 (!) 47 61 (!) 52  Resp: 15 16 16 16   Temp:      TempSrc:      SpO2: 97% 98% 95% 95%   5:18 AM Patient's rates his pain as a 0 out of 10 now and states he is ready to go home.  His hemoglobin is 6.7 which is consistent with previous hemoglobins.  PROCEDURES    ED DIAGNOSES     ICD-10-CM   1. Sickle cell anemia with pain ([place])  D57.00        Bobbie Valletta, Jenny Reichmann, MD 04/12/19 [id number]

## 2019-04-12 NOTE — ED Triage Notes (Signed)
Pt brought in for sickle cell pain crisis. Pt states back pain that radiates to abdomen, chest, and hip. Rates 8/10, home meds not helping. Pain started at 10pm last night.

## 2019-06-03 ENCOUNTER — Encounter (HOSPITAL_COMMUNITY): Payer: Self-pay | Admitting: Emergency Medicine

## 2019-06-03 ENCOUNTER — Other Ambulatory Visit: Payer: Self-pay

## 2019-06-03 ENCOUNTER — Emergency Department (HOSPITAL_COMMUNITY)
Admission: EM | Admit: 2019-06-03 | Discharge: 2019-06-03 | Disposition: A | Discharge status: Home / Self Care | Payer: Medicare HMO | Attending: Emergency Medicine | Admitting: Emergency Medicine

## 2019-06-03 DIAGNOSIS — R220 Localized swelling, mass and lump, head: Secondary | ICD-10-CM | POA: Diagnosis present

## 2019-06-03 DIAGNOSIS — J0191 Acute recurrent sinusitis, unspecified: Secondary | ICD-10-CM

## 2019-06-03 DIAGNOSIS — Z87891 Personal history of nicotine dependence: Secondary | ICD-10-CM | POA: Diagnosis not present

## 2019-06-03 DIAGNOSIS — N183 Chronic kidney disease, stage 3 (moderate): Secondary | ICD-10-CM | POA: Insufficient documentation

## 2019-06-03 DIAGNOSIS — Z79899 Other long term (current) drug therapy: Secondary | ICD-10-CM | POA: Diagnosis not present

## 2019-06-03 MED ORDER — AMOXICILLIN-POT CLAVULANATE 875-125 MG PO TABS: 1.0000 | ORAL_TABLET | Freq: Two times a day (BID) | ORAL | 0 refills | Status: DC

## 2019-06-03 NOTE — Discharge Instructions (Signed)
Get help right away if:  You have a severe headache.  You have persistent vomiting.  You have severe pain or swelling around your face or eyes.  You have vision problems.  You develop confusion.  Your neck is stiff.  You have trouble breathing.

## 2019-06-03 NOTE — ED Triage Notes (Signed)
Patient reports swelling to right nose and "under chin" since last night. Denies injury. Reports pain 6/10. Denies cough, congestion, fever, body aches, chest pain, SOB. Speaking in full sentences without difficulty.

## 2019-06-03 NOTE — ED Provider Notes (Signed)
[facility]-EMERGENCY DEPT Provider Note   CSN: [id number] Arrival date & time: 06/03/19  1135     History   Chief Complaint Chief Complaint  Patient presents with  . Facial Swelling    HPI Shawn Adams is a 43 y.o. male who  has a past medical history of Aneurysm (HCC), Heart murmur, Sarcoid, and Sickle cell anemia (HCC), nasal sarcoidosis, chronic sinusitis who presents with  Right facial pain and swelling.  Patient states that he had "my sinuses washed out last month at [facility]."  He denies dental pain.    HPI  Past Medical History:  Diagnosis Date  . Aneurysm (HCC)    Pt states happened in 2011, Brain   . Heart murmur   . Sarcoid   . Sickle cell anemia [facility])     Patient Active Problem List   Diagnosis Date Noted  . Chronic pain syndrome   . Sarcoidosis 07/06/2018  . Persistent asthma without complication 07/06/2018  . Headache 05/03/2016  . Anemia of chronic disease 05/03/2016  . Headache disorder   . Vitamin D deficiency 02/09/2016  . Heart murmur 02/09/2016  . Recurrent occipital headache 10/15/2015  . History of aneurysm 10/13/2015  . Left hip pain 07/30/2015  . Left ankle pain 07/08/2015  . Sickle cell crisis (HCC) 03/08/2015  . 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 Sickle cell anemia with crisis (HCC) 12/07/2014  . Sickle cell anemia (HCC) 06/20/2014  . CKD (chronic kidney disease) stage 3, GFR 30-59 ml/min (HCC) 06/20/2014  . Leukocytosis 06/20/2014  . Sickle cell pain crisis (HCC) 06/20/2014  . Hypercalcemia 06/20/2014  . Symptomatic anemia 05/31/2014    Past Surgical History:  Procedure Laterality Date  . LYMPH NODE BIOPSY          Home Medications    Prior to Admission medications   Medication Sig Start Date End Date Taking? Authorizing Provider  acetaminophen (TYLENOL) 500 MG tablet Take 1,000 mg by mouth every 6 (six) hours as needed for moderate pain or fever (pain).     [provider]   albuterol (PROVENTIL HFA;VENTOLIN HFA) 108 (90 Base) MCG/ACT inhaler Inhale 2 puffs into the lungs every 4 (four) hours as needed for wheezing or shortness of breath (cough, shortness of breath or wheezing.). 09/21/17   Massie Maroon, FNP  Ascorbic Acid (VITAMIN C) 1000 MG tablet Take 1,000 mg by mouth daily.    [provider]  budesonide-formoterol (SYMBICORT) 160-4.5 MCG/ACT inhaler Inhale 2 puffs into the lungs 2 (two) times daily. 05/10/18 05/10/19  [provider]  cephALEXin (KEFLEX) 500 MG capsule Take 500 mg by mouth 3 (three) times daily. 04/09/19   [provider]  cyclobenzaprine (FLEXERIL) 10 MG tablet Take 1 tablet (10 mg total) by mouth 3 (three) times daily as needed for muscle spasms. 04/09/18   Street, Mercedes, PA-C  DOK 100 MG capsule Take 100 mg by mouth daily as needed for mild constipation.  11/08/18   [provider]  famotidine (PEPCID) 10 MG tablet Take 10 mg by mouth daily as needed for heartburn or indigestion.    [provider]  folic acid (FOLVITE) 1 MG tablet Take 1 tablet (1 mg total) by mouth daily. 02/09/16   Quentin Angst, MD  GINSENG PO Take 1 tablet by mouth 2 (two) times a day.    [provider]  hydroxyurea (HYDREA) 500 MG capsule Take 2 capsules (1,000 mg total) by mouth daily. May take with  food to minimize GI side effects. Patient taking differently: Take 500 mg by mouth 4 (four) times daily. May take with food to minimize GI side effects. 02/09/16   Quentin AngstJegede, [name], MD  Melatonin 10 MG TABS Take 10 mg by mouth at bedtime.    [provider]  ondansetron (ZOFRAN) 4 MG tablet Take 1 tablet (4 mg total) by mouth every 6 (six) hours. Patient taking differently: Take 4 mg by mouth every 8 (eight) hours as needed for nausea or vomiting.  12/21/15   Hedges, Tinnie GensJeffrey, PA-C  Oxycodone HCl 20 MG TABS Take 20 mg by mouth every 4 (four) hours as needed (pain).     [provider]   prednisoLONE acetate (PRED FORTE) 1 % ophthalmic suspension See admin instructions. Apply 3 drops to each nostril in head-hanging position twice daily 11/26/18   [provider]  predniSONE (DELTASONE) 10 MG tablet Take 40 mg by mouth daily. 04/09/19   [provider]  vitamin B-12 (CYANOCOBALAMIN) 500 MCG tablet Take 500 mcg by mouth daily.    [provider]    Family History Family History  Problem Relation Age of Onset  . Diabetes Mother   . Hypertension Father   . Lupus Sister     Social History Social History   Tobacco Use  . Smoking status: Former Smoker    Types: Cigarettes  . Smokeless tobacco: Never Used  Substance Use Topics  . Alcohol use: Yes    Alcohol/week: 1.0 standard drinks    Types: 1 Cans of beer per week    Comment: occasionally  . Drug use: Not Currently    Types: Marijuana     Allergies   Patient has no known allergies.   Review of Systems Review of Systems Ten systems reviewed and are negative for acute change, except as noted in the HPI.    Physical Exam Updated Vital Signs BP 136/80   Pulse (!) 102   Temp 98.4 F (36.9 C)   Resp 17   SpO2 97%   Physical Exam Vitals signs and nursing note reviewed.  Constitutional:      General: He is not in acute distress.    Appearance: He is well-developed. He is not diaphoretic.  HENT:     Head: Normocephalic and atraumatic.     Nose:      Mouth/Throat:   Eyes:     General: No scleral icterus.    Conjunctiva/sclera: Conjunctivae normal.  Neck:     Musculoskeletal: Normal range of motion and neck supple.  Cardiovascular:     Rate and Rhythm: Normal rate and regular rhythm.     Heart sounds: Normal heart sounds.  Pulmonary:     Effort: Pulmonary effort is normal. No respiratory distress.     Breath sounds: Normal breath sounds.  Abdominal:     Palpations: Abdomen is soft.     Tenderness: There is no abdominal tenderness.  Skin:    General: Skin is warm and  dry.  Neurological:     Mental Status: He is alert.  Psychiatric:        Behavior: Behavior normal.      ED Treatments / Results  Labs (all labs ordered are listed, but only abnormal results are displayed) Labs Reviewed - No data to display  EKG None  Radiology No results found.  Procedures Procedures (including critical care time)  Medications Ordered in ED Medications - No data to display   Initial Impression / Assessment and  Plan / ED Course  I have reviewed the triage vital signs and the nursing notes.  Pertinent labs & imaging results that were available during my care of the patient were reviewed by me and considered in my medical decision making (see chart for details).        Patient here with purulent nasal drainage from the right naris.  Seen in shared visit with Dr. Ashok Cordia.  We will begin the patient on Augmentin.  I have asked that he call his ENT doctor today or tomorrow morning for urgent follow-up.  Discussed return precautions.  He has no proptosis or other signs of severe infection.  He appears appropriate for discharge at this time  Final Clinical Impressions(s) / ED Diagnoses   Final diagnoses:  None    ED Discharge Orders    None       Margarita Mail, PA-C 06/03/19 1231    Lajean Saver, MD 06/03/19 [id number]

## 2019-06-05 ENCOUNTER — Encounter (HOSPITAL_COMMUNITY): Payer: Self-pay

## 2019-06-05 ENCOUNTER — Encounter (HOSPITAL_COMMUNITY): Payer: Self-pay | Admitting: *Deleted

## 2019-08-01 ENCOUNTER — Emergency Department (HOSPITAL_COMMUNITY)
Admission: EM | Admit: 2019-08-01 | Discharge: 2019-08-01 | Disposition: A | Discharge status: Home / Self Care | Payer: Medicare HMO | Attending: Emergency Medicine | Admitting: Emergency Medicine

## 2019-08-01 ENCOUNTER — Other Ambulatory Visit: Payer: Self-pay

## 2019-08-01 ENCOUNTER — Encounter (HOSPITAL_COMMUNITY): Payer: Self-pay | Admitting: Emergency Medicine

## 2019-08-01 DIAGNOSIS — Z79899 Other long term (current) drug therapy: Secondary | ICD-10-CM | POA: Diagnosis not present

## 2019-08-01 DIAGNOSIS — Z87891 Personal history of nicotine dependence: Secondary | ICD-10-CM | POA: Insufficient documentation

## 2019-08-01 DIAGNOSIS — M79604 Pain in right leg: Secondary | ICD-10-CM | POA: Diagnosis not present

## 2019-08-01 NOTE — ED Triage Notes (Signed)
Patient c/o intermittent left thigh pain x months. Denies injury. Hx sickle cell. States leg pain does not mimic SCC. Denies leg swelling, chest pain, and SOB.

## 2019-08-01 NOTE — ED Provider Notes (Signed)
[facility]-EMERGENCY DEPT Provider Note   CSN: [id number] Arrival date & time: 08/01/19  2025     History   Chief Complaint Chief Complaint  Patient presents with  . Leg Pain    HPI Shawn Adams is a 43 y.o. male.     HPI   He is here for evaluation of ongoing pain in right leg, from the hip to the knee, present for about 7 months.  He denies known trauma.  The pain tends to come and go and last as much as a day but usually improves when he takes pain medicine such as oxycodone or Tylenol.  He has sickle cell anemia, but rarely gets sickle cell crisis.  He does not feel that this pain represents a crisis.  He denies fever, chills, nausea, vomiting, weakness or dizziness.  He does not get much exercise and states he typically "sits around the house."  There are no other known modifying factors.  Past Medical History:  Diagnosis Date  . Aneurysm (HCC)    Pt states happened in 2011, Brain   . Heart murmur   . Sarcoid   . Sickle cell anemia [facility](HCC)     Patient Active Problem List   Diagnosis Date Noted  . Chronic pain syndrome   . Sarcoidosis 07/06/2018  . Persistent asthma without complication 07/06/2018  . Headache 05/03/2016  . Anemia of chronic disease 05/03/2016  . Headache disorder   . Vitamin D deficiency 02/09/2016  . Heart murmur 02/09/2016  . Recurrent occipital headache 10/15/2015  . History of aneurysm 10/13/2015  . Left hip pain 07/30/2015  . Left ankle pain 07/08/2015  . Sickle cell crisis (HCC) 03/08/2015  . 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 Sickle cell anemia with crisis (HCC) 12/07/2014  . Sickle cell anemia (HCC) 06/20/2014  . CKD (chronic kidney disease) stage 3, GFR 30-59 ml/min (HCC) 06/20/2014  . Leukocytosis 06/20/2014  . Sickle cell pain crisis (HCC) 06/20/2014  . Hypercalcemia 06/20/2014  . Symptomatic anemia 05/31/2014    Past Surgical History:  Procedure Laterality Date  . LYMPH NODE BIOPSY           Home Medications    Prior to Admission medications   Medication Sig Start Date End Date Taking? Authorizing Provider  acetaminophen (TYLENOL) 500 MG tablet Take 1,000 mg by mouth every 6 (six) hours as needed for moderate pain or fever (pain).     [provider]  albuterol (PROVENTIL HFA;VENTOLIN HFA) 108 (90 Base) MCG/ACT inhaler Inhale 2 puffs into the lungs every 4 (four) hours as needed for wheezing or shortness of breath (cough, shortness of breath or wheezing.). 09/21/17   Massie MaroonHollis, [name], FNP  amoxicillin-clavulanate (AUGMENTIN) 875-125 MG tablet Take 1 tablet by mouth 2 (two) times daily. One po bid x 7 days 06/03/19   Arthor CaptainHarris, Abigail, PA-C  Ascorbic Acid (VITAMIN C) 1000 MG tablet Take 1,000 mg by mouth daily.    [provider]  budesonide-formoterol (SYMBICORT) 160-4.5 MCG/ACT inhaler Inhale 2 puffs into the lungs 2 (two) times daily. 05/10/18 05/10/19  [provider]  cephALEXin (KEFLEX) 500 MG capsule Take 500 mg by mouth 3 (three) times daily. 04/09/19   [provider]  cyclobenzaprine (FLEXERIL) 10 MG tablet Take 1 tablet (10 mg total) by mouth 3 (three) times daily as needed for muscle spasms. 04/09/18   Street, Mercedes, PA-C  DOK 100 MG capsule Take 100 mg by mouth daily as needed for mild constipation.  11/08/18   [provider]  famotidine (PEPCID) 10 MG tablet Take 10 mg by mouth daily as needed for heartburn or indigestion.    [provider]  folic acid (FOLVITE) 1 MG tablet Take 1 tablet (1 mg total) by mouth daily. 02/09/16   [name], MD  GINSENG PO Take 1 tablet by mouth 2 (two) times a day.    [provider]  hydroxyurea (HYDREA) 500 MG capsule Take 2 capsules (1,000 mg total) by mouth daily. May take with food to minimize GI side effects. Patient taking differently: Take 500 mg by mouth 4 (four) times daily. May take with food to minimize GI side effects. 02/09/16   [name], MD   Melatonin 10 MG TABS Take 10 mg by mouth at bedtime.    [provider]  ondansetron (ZOFRAN) 4 MG tablet Take 1 tablet (4 mg total) by mouth every 6 (six) hours. Patient taking differently: Take 4 mg by mouth every 8 (eight) hours as needed for nausea or vomiting.  12/21/15   Hedges, Dellis Filbert, PA-C  Oxycodone HCl 20 MG TABS Take 20 mg by mouth every 4 (four) hours as needed (pain).     [provider]  prednisoLONE acetate (PRED FORTE) 1 % ophthalmic suspension See admin instructions. Apply 3 drops to each nostril in head-hanging position twice daily 11/26/18   [provider]  predniSONE (DELTASONE) 10 MG tablet Take 40 mg by mouth daily. 04/09/19   [provider]  vitamin B-12 (CYANOCOBALAMIN) 500 MCG tablet Take 500 mcg by mouth daily.    [provider]    Family History Family History  Problem Relation Age of Onset  . Diabetes Mother   . Hypertension Father   . Lupus Sister     Social History Social History   Tobacco Use  . Smoking status: Former Smoker    Types: Cigarettes  . Smokeless tobacco: Never Used  Substance Use Topics  . Alcohol use: Yes    Alcohol/week: 1.0 standard drinks    Types: 1 Cans of beer per week    Comment: occasionally  . Drug use: Not Currently    Types: Marijuana     Allergies   Patient has no known allergies.   Review of Systems Review of Systems  All other systems reviewed and are negative.    Physical Exam Updated Vital Signs BP 129/83 (BP Location: Right Arm)   Pulse 77   Temp 98.2 F (36.8 C) (Oral)   Resp 16   SpO2 97%   Physical Exam Vitals signs and nursing note reviewed.  Constitutional:      Appearance: He is well-developed. He is not ill-appearing or diaphoretic.  HENT:     Head: Normocephalic and atraumatic.     Right Ear: External ear normal.     Left Ear: External ear normal.  Eyes:     Conjunctiva/sclera: Conjunctivae normal.     Pupils: Pupils are equal, round, and  reactive to light.  Neck:     Musculoskeletal: Normal range of motion and neck supple.     Trachea: Phonation normal.  Cardiovascular:     Rate and Rhythm: Normal rate.  Pulmonary:     Effort: Pulmonary effort is normal.  Musculoskeletal: Normal range of motion.     Comments: Right knee and right hip have normal mobility, and are stable with stress.  He ambulates with a normal gait.  Skin:    General: Skin is warm and dry.  Neurological:     Mental Status: He is alert and oriented to person, place, and time.     Cranial Nerves: No cranial nerve deficit.     Sensory: No sensory deficit.     Motor: No abnormal muscle tone.     Coordination: Coordination normal.  Psychiatric:        Mood and Affect: Mood normal.        Behavior: Behavior normal.        Thought Content: Thought content normal.        Judgment: Judgment normal.      ED Treatments / Results  Labs (all labs ordered are listed, but only abnormal results are displayed) Labs Reviewed - No data to display  EKG None  Radiology No results found.  Procedures Procedures (including critical care time)  Medications Ordered in ED Medications - No data to display   Initial Impression / Assessment and Plan / ED Course  I have reviewed the triage vital signs and the nursing notes.  Pertinent labs & imaging results that were available during my care of the patient were reviewed by me and considered in my medical decision making (see chart for details).         Patient Vitals for the past 24 hrs:  BP Temp Temp src Pulse Resp SpO2  08/01/19 2045 129/83 98.2 F (36.8 C) Oral 77 16 97 %    10:55 PM Reevaluation with update and discussion. After initial assessment and treatment, an updated evaluation reveals he is comfortable has no further complaints, findings discussed and questions answered. Mancel Bale   Medical Decision Making: Nonspecific leg pain, right, without evidence for joint swelling.  Possible  relation to sickle cell anemia, versus sarcoid.  Doubt significant arthritis, fracture, lumbar radiculopathy or myelopathy.  No indication for further ED intervention at this time.  CRITICAL CARE-no Performed by: Mancel Bale  Nursing Notes Reviewed/ Care Coordinated Applicable Imaging Reviewed Interpretation of Laboratory Data incorporated into ED treatment  The patient appears reasonably screened and/or stabilized for discharge and I doubt any other medical condition or other [facility] requiring further screening, evaluation, or treatment in the ED at this time prior to discharge.  Plan: Home Medications-continue usual medicines; Home Treatments-heat treatments to painful area; return here if the recommended treatment, does not improve the symptoms; Recommended follow up-PCP follow-up as needed   Final Clinical Impressions(s) / ED Diagnoses   Final diagnoses:  None    ED Discharge Orders    None       Mancel Bale, MD 08/01/19 2256

## 2019-08-01 NOTE — Discharge Instructions (Signed)
There is no sign of serious problems such as fracture, sciatica, ligamentous injury or DVT of the right leg.  It is safe to use Tylenol, for pain and your usual pain medicine if needed.  Also try using heat on the sore areas 3 or 4 times a day when you are having the pain.  Check with your doctor if not better in a week or so.

## 2019-09-26 ENCOUNTER — Encounter (HOSPITAL_COMMUNITY): Payer: Self-pay | Admitting: *Deleted

## 2019-09-26 ENCOUNTER — Emergency Department (HOSPITAL_COMMUNITY)
Admission: EM | Admit: 2019-09-26 | Discharge: 2019-09-26 | Disposition: A | Discharge status: Home / Self Care | Payer: Medicare HMO | Attending: Emergency Medicine | Admitting: Emergency Medicine

## 2019-09-26 ENCOUNTER — Emergency Department (HOSPITAL_COMMUNITY): Payer: Medicare HMO

## 2019-09-26 ENCOUNTER — Other Ambulatory Visit: Payer: Self-pay

## 2019-09-26 DIAGNOSIS — Z87891 Personal history of nicotine dependence: Secondary | ICD-10-CM | POA: Insufficient documentation

## 2019-09-26 DIAGNOSIS — Z20828 Contact with and (suspected) exposure to other viral communicable diseases: Secondary | ICD-10-CM | POA: Insufficient documentation

## 2019-09-26 DIAGNOSIS — R05 Cough: Secondary | ICD-10-CM

## 2019-09-26 DIAGNOSIS — Z79899 Other long term (current) drug therapy: Secondary | ICD-10-CM | POA: Insufficient documentation

## 2019-09-26 DIAGNOSIS — N183 Chronic kidney disease, stage 3 unspecified: Secondary | ICD-10-CM | POA: Insufficient documentation

## 2019-09-26 DIAGNOSIS — R059 Cough, unspecified: Secondary | ICD-10-CM

## 2019-09-26 LAB — TROPONIN I (HIGH SENSITIVITY): Troponin I (High Sensitivity): 6 ng/L (ref <18)

## 2019-09-26 NOTE — ED Triage Notes (Signed)
Pt states he thinks he was exposed to Covid on Dec 20th. Friend tested positive on 25th. Pt has productive cough with yellow secretions, Temp at home 99.3, chills x 2 nights.

## 2019-09-26 NOTE — ED Provider Notes (Signed)
[facility]   CSN: [id number] Arrival date & time: 09/26/19  1745     History Chief Complaint  Patient presents with  . covid exposure  . Cough    Shawn Adams is a 43 y.o. male.  Patient states Shawn Adams was exposed to a friend on December 20 who tested positive for covid on the 09/20/19. Shawn Adams developed a cough two days ago. Shawn Adams reports intermittent fever, chills at night. No shortness of breath. History of sickle cell, sarcoid.  The history is provided by the patient. No language interpreter was used.  Cough Cough characteristics:  Productive Sputum characteristics:  Yellow Severity:  Mild Onset quality:  Gradual Timing:  Intermittent Progression:  Waxing and waning Chronicity:  New Smoker: yes   Associated symptoms: chills, fever and sinus congestion        Past Medical History:  Diagnosis Date  . Aneurysm (HCC)    Pt states happened in 2011, Brain   . Heart murmur   . Sarcoid   . Sickle cell anemia [facility])     Patient Active Problem List   Diagnosis Date Noted  . Chronic pain syndrome   . Sarcoidosis 07/06/2018  . Persistent asthma without complication 07/06/2018  . Headache 05/03/2016  . Anemia of chronic disease 05/03/2016  . Headache disorder   . Vitamin D deficiency 02/09/2016  . Heart murmur 02/09/2016  . Recurrent occipital headache 10/15/2015  . History of aneurysm 10/13/2015  . Left hip pain 07/30/2015  . Left ankle pain 07/08/2015  . Sickle cell crisis (HCC) 03/08/2015  . 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 Sickle cell anemia with crisis (HCC) 12/07/2014  . Sickle cell anemia (HCC) 06/20/2014  . CKD (chronic kidney disease) stage 3, GFR 30-59 ml/min (HCC) 06/20/2014  . Leukocytosis 06/20/2014  . Sickle cell pain crisis (HCC) 06/20/2014  . Hypercalcemia 06/20/2014  . Symptomatic anemia 05/31/2014    Past Surgical History:  Procedure Laterality Date  . LYMPH NODE BIOPSY          Family History  Problem Relation Age of Onset  . Diabetes Mother   . Hypertension Father   . Lupus Sister     Social History   Tobacco Use  . Smoking status: Former Smoker    Types: Cigarettes  . Smokeless tobacco: Never Used  Substance Use Topics  . Alcohol use: Not Currently    Alcohol/week: 1.0 standard drinks    Types: 1 Cans of beer per week    Comment: occasionally  . Drug use: Not Currently    Types: Marijuana    Home Medications Prior to Admission medications   Medication Sig Start Date End Date Taking? Authorizing Provider  acetaminophen (TYLENOL) 500 MG tablet Take 1,000 mg by mouth every 6 (six) hours as needed for moderate pain or fever (pain).     [provider]  albuterol (PROVENTIL HFA;VENTOLIN HFA) 108 (90 Base) MCG/ACT inhaler Inhale 2 puffs into the lungs every 4 (four) hours as needed for wheezing or shortness of breath (cough, shortness of breath or wheezing.). 09/21/17   Massie Maroon, FNP  amoxicillin-clavulanate (AUGMENTIN) 875-125 MG tablet Take 1 tablet by mouth 2 (two) times daily. One po bid x 7 days 06/03/19   Arthor Captain, PA-C  Ascorbic Acid (VITAMIN C) 1000 MG tablet Take 1,000 mg by mouth daily.    [provider]  budesonide-formoterol (SYMBICORT) 160-4.5 MCG/ACT inhaler Inhale 2 puffs into the lungs 2 (two) times daily. 05/10/18  05/10/19  [provider]  cephALEXin (KEFLEX) 500 MG capsule Take 500 mg by mouth 3 (three) times daily. 04/09/19   [provider]  cyclobenzaprine (FLEXERIL) 10 MG tablet Take 1 tablet (10 mg total) by mouth 3 (three) times daily as needed for muscle spasms. 04/09/18   Street, Mercedes, PA-C  DOK 100 MG capsule Take 100 mg by mouth daily as needed for mild constipation.  11/08/18   [provider]  famotidine (PEPCID) 10 MG tablet Take 10 mg by mouth daily as needed for heartburn or indigestion.    [provider]  folic acid (FOLVITE) 1 MG tablet Take 1  tablet (1 mg total) by mouth daily. 02/09/16   Quentin AngstJegede, [name], MD  GINSENG PO Take 1 tablet by mouth 2 (two) times a day.    [provider]  hydroxyurea (HYDREA) 500 MG capsule Take 2 capsules (1,000 mg total) by mouth daily. May take with food to minimize GI side effects. Patient taking differently: Take 500 mg by mouth 4 (four) times daily. May take with food to minimize GI side effects. 02/09/16   Quentin AngstJegede, [name], MD  Melatonin 10 MG TABS Take 10 mg by mouth at bedtime.    [provider]  ondansetron (ZOFRAN) 4 MG tablet Take 1 tablet (4 mg total) by mouth every 6 (six) hours. Patient taking differently: Take 4 mg by mouth every 8 (eight) hours as needed for nausea or vomiting.  12/21/15   Hedges, Tinnie GensJeffrey, PA-C  Oxycodone HCl 20 MG TABS Take 20 mg by mouth every 4 (four) hours as needed (pain).     [provider]  prednisoLONE acetate (PRED FORTE) 1 % ophthalmic suspension See admin instructions. Apply 3 drops to each nostril in head-hanging position twice daily 11/26/18   [provider]  predniSONE (DELTASONE) 10 MG tablet Take 40 mg by mouth daily. 04/09/19   [provider]  vitamin B-12 (CYANOCOBALAMIN) 500 MCG tablet Take 500 mcg by mouth daily.    [provider]    Allergies    Patient has no known allergies.  Review of Systems   Review of Systems  Constitutional: Positive for chills and fever.  Respiratory: Positive for cough.   All other systems reviewed and are negative.   Physical Exam Updated Vital Signs BP (!) 143/95 (BP Location: Right Arm)   Pulse (!) 120   Temp 98.3 F (36.8 C) (Oral)   Resp 18   Ht 6' (1.829 m)   Wt 61.2 kg   SpO2 99%   BMI 18.31 kg/m   Physical Exam Vitals and nursing note reviewed.  Constitutional:      General: Shawn Adams is not in acute distress.    Appearance: Normal appearance.  HENT:     Head: Normocephalic.     Nose: Congestion present.  Eyes:     Conjunctiva/sclera:  Conjunctivae normal.  Cardiovascular:     Rate and Rhythm: Tachycardia present.     Heart sounds: Murmur present.  Pulmonary:     Effort: Pulmonary effort is normal.     Breath sounds: Normal breath sounds.  Abdominal:     General: Abdomen is flat.     Palpations: Abdomen is soft.  Musculoskeletal:        General: Normal range of motion.  Lymphadenopathy:     Cervical: No cervical adenopathy.  Skin:    General: Skin is warm and dry.  Neurological:     Mental Status: Shawn Adams is alert and  oriented to person, place, and time.  Psychiatric:        Mood and Affect: Mood normal.     ED Results / Procedures / Treatments   Labs (all labs ordered are listed, but only abnormal results are displayed) Labs Reviewed  SARS CORONAVIRUS 2 (TAT 6-24 HRS)    EKG EKG Interpretation  Date/Time:  Thursday September 26 2019 20:30:15 EST Ventricular Rate:  99 PR Interval:    QRS Duration: 91 QT Interval:  354 QTC Calculation: 455 R Axis:   23 Text Interpretation: Sinus rhythm Borderline prolonged PR interval Consider right atrial enlargement Abnormal R-wave progression, early transition Left ventricular hypertrophy Borderline ST elevation, lateral leads Baseline wander in lead(s) V3 No significant change since last tracing Confirmed by Deno Etienne [phone]) on 09/26/2019 10:03:31 PM   Radiology DG Chest 2 View  Result Date: 09/26/2019 CLINICAL DATA:  Cough x 2 days - COVID exposure - smoker- sickle cell EXAM: CHEST - 2 VIEW COMPARISON:  Chest radiograph 04/12/2019 FINDINGS: Stable cardiomediastinal contours. Bilateral hilar enlargement secondary to known adenopathy appears unchanged. There is chronic interstitial coarsening likely secondary to smoking. No new focal infiltrate. No pneumothorax or pleural effusion. No acute finding in the visualized skeleton. IMPRESSION: 1.  No acute cardiopulmonary finding. 2.  Stable bulky adenopathy. 3. Chronic coarse interstitial markings likely related to smoking  history. Electronically Signed   By: Audie Pinto M.D.   On: 09/26/2019 19:29    Procedures Procedures (including critical care time)  Medications Ordered in ED Medications - No data to display  ED Course  I have reviewed the triage vital signs and the nursing notes.  Pertinent labs & imaging results that were available during my care of the patient were reviewed by me and considered in my medical decision making (see chart for details).    MDM Rules/Calculators/A&P                       Patient recently exposed to a covid positive contact. Shawn Adams reports occasional cough and fever. Shawn Adams is non-toxic appearing. Shawn Adams was noted to be a little tachycardic. ECG obtained, LVH with borderline ST elevation in lateral leads. No active chest pain. Normal troponin.  Patient discussed with Dr. Tyrone Nine.  Pt symptoms consistent with URI. CXR negative for acute infiltrate. Pt will be discharged with symptomatic treatment.  Discussed return precautions.  Pt is hemodynamically stable & in NAD prior to discharge.  Severin Bou was evaluated in Emergency Department on 09/26/2019 for the symptoms described in the history of present illness. Shawn Adams was evaluated in the context of the global COVID-19 pandemic, which necessitated consideration that the patient might be at risk for infection with the SARS-CoV-2 virus that causes COVID-19. Institutional protocols and algorithms that pertain to the evaluation of patients at risk for COVID-19 are in a state of rapid change based on information released by regulatory bodies including the CDC and federal and state organizations. These policies and algorithms were followed during the patient's care in the ED.  l Impression(s) / ED Diagnoses Final diagnoses:  Cough    Rx / DC Orders ED Discharge Orders    None       Etta Quill, NP 09/26/19 [place], [place], DO 10/01/19 [id number]

## 2019-09-27 LAB — SARS CORONAVIRUS 2 (TAT 6-24 HRS): SARS Coronavirus 2: NEGATIVE

## 2020-04-07 ENCOUNTER — Emergency Department (HOSPITAL_COMMUNITY): Payer: Medicare HMO

## 2020-04-07 ENCOUNTER — Encounter (HOSPITAL_COMMUNITY): Payer: Self-pay

## 2020-04-07 ENCOUNTER — Inpatient Hospital Stay (HOSPITAL_COMMUNITY)
Admission: EM | Admit: 2020-04-07 | Discharge: 2020-04-12 | DRG: 871 | Disposition: A | Discharge status: Home / Self Care | Payer: Medicare HMO | Attending: Internal Medicine | Admitting: Internal Medicine

## 2020-04-07 ENCOUNTER — Other Ambulatory Visit: Payer: Self-pay

## 2020-04-07 DIAGNOSIS — N170 Acute kidney failure with tubular necrosis: Secondary | ICD-10-CM | POA: Diagnosis present

## 2020-04-07 DIAGNOSIS — Z79891 Long term (current) use of opiate analgesic: Secondary | ICD-10-CM

## 2020-04-07 DIAGNOSIS — N390 Urinary tract infection, site not specified: Secondary | ICD-10-CM | POA: Diagnosis present

## 2020-04-07 DIAGNOSIS — Z7951 Long term (current) use of inhaled steroids: Secondary | ICD-10-CM

## 2020-04-07 DIAGNOSIS — J4530 Mild persistent asthma, uncomplicated: Secondary | ICD-10-CM | POA: Diagnosis present

## 2020-04-07 DIAGNOSIS — G894 Chronic pain syndrome: Secondary | ICD-10-CM | POA: Diagnosis present

## 2020-04-07 DIAGNOSIS — E871 Hypo-osmolality and hyponatremia: Secondary | ICD-10-CM | POA: Diagnosis present

## 2020-04-07 DIAGNOSIS — Z791 Long term (current) use of non-steroidal anti-inflammatories (NSAID): Secondary | ICD-10-CM

## 2020-04-07 DIAGNOSIS — E872 Acidosis: Secondary | ICD-10-CM | POA: Diagnosis not present

## 2020-04-07 DIAGNOSIS — A4151 Sepsis due to Escherichia coli [E. coli]: Secondary | ICD-10-CM

## 2020-04-07 DIAGNOSIS — Z87891 Personal history of nicotine dependence: Secondary | ICD-10-CM | POA: Diagnosis not present

## 2020-04-07 DIAGNOSIS — Z8249 Family history of ischemic heart disease and other diseases of the circulatory system: Secondary | ICD-10-CM | POA: Diagnosis not present

## 2020-04-07 DIAGNOSIS — Z833 Family history of diabetes mellitus: Secondary | ICD-10-CM | POA: Diagnosis not present

## 2020-04-07 DIAGNOSIS — R109 Unspecified abdominal pain: Secondary | ICD-10-CM

## 2020-04-07 DIAGNOSIS — D571 Sickle-cell disease without crisis: Secondary | ICD-10-CM | POA: Diagnosis present

## 2020-04-07 DIAGNOSIS — N183 Chronic kidney disease, stage 3 unspecified: Secondary | ICD-10-CM

## 2020-04-07 DIAGNOSIS — Z79899 Other long term (current) drug therapy: Secondary | ICD-10-CM | POA: Diagnosis not present

## 2020-04-07 DIAGNOSIS — Z832 Family history of diseases of the blood and blood-forming organs and certain disorders involving the immune mechanism: Secondary | ICD-10-CM

## 2020-04-07 DIAGNOSIS — D649 Anemia, unspecified: Secondary | ICD-10-CM | POA: Diagnosis not present

## 2020-04-07 DIAGNOSIS — Z8679 Personal history of other diseases of the circulatory system: Secondary | ICD-10-CM

## 2020-04-07 DIAGNOSIS — N179 Acute kidney failure, unspecified: Secondary | ICD-10-CM | POA: Diagnosis not present

## 2020-04-07 DIAGNOSIS — R079 Chest pain, unspecified: Secondary | ICD-10-CM

## 2020-04-07 DIAGNOSIS — N182 Chronic kidney disease, stage 2 (mild): Secondary | ICD-10-CM | POA: Diagnosis present

## 2020-04-07 DIAGNOSIS — R6520 Severe sepsis without septic shock: Secondary | ICD-10-CM | POA: Diagnosis present

## 2020-04-07 DIAGNOSIS — R011 Cardiac murmur, unspecified: Secondary | ICD-10-CM | POA: Diagnosis not present

## 2020-04-07 DIAGNOSIS — Z20822 Contact with and (suspected) exposure to covid-19: Secondary | ICD-10-CM | POA: Diagnosis present

## 2020-04-07 DIAGNOSIS — D869 Sarcoidosis, unspecified: Secondary | ICD-10-CM | POA: Diagnosis present

## 2020-04-07 DIAGNOSIS — D638 Anemia in other chronic diseases classified elsewhere: Secondary | ICD-10-CM | POA: Diagnosis not present

## 2020-04-07 DIAGNOSIS — D5700 Hb-SS disease with crisis, unspecified: Secondary | ICD-10-CM | POA: Diagnosis present

## 2020-04-07 DIAGNOSIS — R1030 Lower abdominal pain, unspecified: Secondary | ICD-10-CM | POA: Diagnosis not present

## 2020-04-07 DIAGNOSIS — N178 Other acute kidney failure: Secondary | ICD-10-CM | POA: Diagnosis not present

## 2020-04-07 DIAGNOSIS — D860 Sarcoidosis of lung: Secondary | ICD-10-CM | POA: Diagnosis present

## 2020-04-07 DIAGNOSIS — A419 Sepsis, unspecified organism: Secondary | ICD-10-CM | POA: Diagnosis not present

## 2020-04-07 DIAGNOSIS — D508 Other iron deficiency anemias: Secondary | ICD-10-CM | POA: Diagnosis not present

## 2020-04-07 LAB — COMPREHENSIVE METABOLIC PANEL
ALT: 29 U/L (ref 0–44)
AST: 48 U/L — ABNORMAL HIGH (ref 15–41)
Albumin: 3.4 g/dL — ABNORMAL LOW (ref 3.5–5.0)
Alkaline Phosphatase: 96 U/L (ref 38–126)
Anion gap: 14 (ref 5–15)
BUN: 104 mg/dL — ABNORMAL HIGH (ref 6–20)
CO2: 11 mmol/L — ABNORMAL LOW (ref 22–32)
Calcium: 8.8 mg/dL — ABNORMAL LOW (ref 8.9–10.3)
Chloride: 105 mmol/L (ref 98–111)
Creatinine, Ser: 7.06 mg/dL — ABNORMAL HIGH (ref 0.61–1.24)
GFR calc Af Amer: 10 mL/min — ABNORMAL LOW (ref >60)
GFR calc non Af Amer: 9 mL/min — ABNORMAL LOW (ref >60)
Glucose, Bld: 105 mg/dL — ABNORMAL HIGH (ref 70–99)
Potassium: 4.2 mmol/L (ref 3.5–5.1)
Sodium: 130 mmol/L — ABNORMAL LOW (ref 135–145)
Total Bilirubin: 3.2 mg/dL — ABNORMAL HIGH (ref 0.3–1.2)
Total Protein: 7.5 g/dL (ref 6.5–8.1)

## 2020-04-07 LAB — URINALYSIS, ROUTINE W REFLEX MICROSCOPIC
Bilirubin Urine: NEGATIVE
Glucose, UA: NEGATIVE mg/dL
Ketones, ur: NEGATIVE mg/dL
Nitrite: NEGATIVE
Protein, ur: >=300 mg/dL — AB
Specific Gravity, Urine: 1.009 (ref 1.005–1.030)
WBC, UA: >50 WBC/hpf — ABNORMAL HIGH (ref 0–5)
pH: 6 (ref 5.0–8.0)

## 2020-04-07 LAB — CBC
HCT: 11.5 % — ABNORMAL LOW (ref 39.0–52.0)
HCT: 10.2 % — ABNORMAL LOW (ref 39.0–52.0)
Hemoglobin: 4.4 g/dL — CL (ref 13.0–17.0)
Hemoglobin: 3.9 g/dL — CL (ref 13.0–17.0)
MCH: 35.5 pg — ABNORMAL HIGH (ref 26.0–34.0)
MCH: 35.5 pg — ABNORMAL HIGH (ref 26.0–34.0)
MCHC: 38.3 g/dL — ABNORMAL HIGH (ref 30.0–36.0)
MCV: 92.7 fL (ref 80.0–100.0)
MCV: 92.7 fL (ref 80.0–100.0)
Platelets: 253 10*3/uL (ref 150–400)
Platelets: 235 10*3/uL (ref 150–400)
RBC: 1.24 MIL/uL — ABNORMAL LOW (ref 4.22–5.81)
RBC: 1.1 MIL/uL — ABNORMAL LOW (ref 4.22–5.81)
RDW: 19.9 % — ABNORMAL HIGH (ref 11.5–15.5)
RDW: 20.1 % — ABNORMAL HIGH (ref 11.5–15.5)
WBC: 32.4 10*3/uL — ABNORMAL HIGH (ref 4.0–10.5)
WBC: 28.7 10*3/uL — ABNORMAL HIGH (ref 4.0–10.5)
nRBC: 0.3 % — ABNORMAL HIGH (ref 0.0–0.2)
nRBC: 0.4 % — ABNORMAL HIGH (ref 0.0–0.2)

## 2020-04-07 LAB — PROCALCITONIN: Procalcitonin: 91.83 ng/mL

## 2020-04-07 LAB — RETICULOCYTES
Immature Retic Fract: 13 % (ref 2.3–15.9)
RBC.: 1.17 MIL/uL — ABNORMAL LOW (ref 4.22–5.81)
Retic Count, Absolute: 73.7 10*3/uL (ref 19.0–186.0)
Retic Ct Pct: 6.3 % — ABNORMAL HIGH (ref 0.4–3.1)

## 2020-04-07 LAB — CREATININE, SERUM
Creatinine, Ser: 7.09 mg/dL — ABNORMAL HIGH (ref 0.61–1.24)
GFR calc Af Amer: 10 mL/min — ABNORMAL LOW (ref >60)
GFR calc non Af Amer: 9 mL/min — ABNORMAL LOW (ref >60)

## 2020-04-07 LAB — POC OCCULT BLOOD, ED: Fecal Occult Bld: NEGATIVE

## 2020-04-07 LAB — LACTIC ACID, PLASMA
Lactic Acid, Venous: 1.5 mmol/L (ref 0.5–1.9)
Lactic Acid, Venous: 2.8 mmol/L (ref 0.5–1.9)

## 2020-04-07 LAB — LIPASE, BLOOD
Lipase: 58 U/L — ABNORMAL HIGH (ref 11–51)
Lipase: 61 U/L — ABNORMAL HIGH (ref 11–51)

## 2020-04-07 LAB — TROPONIN I (HIGH SENSITIVITY)
Troponin I (High Sensitivity): 7 ng/L (ref <18)
Troponin I (High Sensitivity): 7 ng/L (ref <18)

## 2020-04-07 LAB — SARS CORONAVIRUS 2 BY RT PCR (HOSPITAL ORDER, PERFORMED IN ~~LOC~~ HOSPITAL LAB): SARS Coronavirus 2: NEGATIVE

## 2020-04-07 LAB — PREPARE RBC (CROSSMATCH)

## 2020-04-07 MED ORDER — HYDROXYCHLOROQUINE SULFATE 200 MG PO TABS
200.0000 mg | ORAL_TABLET | Freq: Every day | ORAL | Status: DC
  Administered 2020-04-08 – 2020-04-12 (×5): 200 mg via ORAL
  Filled 2020-04-07 (×6): qty 1

## 2020-04-07 MED ORDER — ALBUTEROL SULFATE HFA 108 (90 BASE) MCG/ACT IN AERS: 2.0000 | INHALATION_SPRAY | RESPIRATORY_TRACT | Status: DC | PRN

## 2020-04-07 MED ORDER — HYDROXYUREA 500 MG PO CAPS: 1000.0000 mg | ORAL_CAPSULE | Freq: Every day | ORAL | Status: DC

## 2020-04-07 MED ORDER — FOLIC ACID 1 MG PO TABS
1.0000 mg | ORAL_TABLET | Freq: Every day | ORAL | Status: DC
  Administered 2020-04-07 – 2020-04-12 (×6): 1 mg via ORAL
  Filled 2020-04-07 (×6): qty 1

## 2020-04-07 MED ORDER — CYCLOBENZAPRINE HCL 10 MG PO TABS: 10.0000 mg | ORAL_TABLET | Freq: Three times a day (TID) | ORAL | Status: DC | PRN

## 2020-04-07 MED ORDER — DOCUSATE SODIUM 100 MG PO CAPS: 100.0000 mg | ORAL_CAPSULE | Freq: Every day | ORAL | Status: DC | PRN

## 2020-04-07 MED ORDER — PANTOPRAZOLE SODIUM 40 MG IV SOLR
40.0000 mg | Freq: Every day | INTRAVENOUS | Status: DC
  Administered 2020-04-10: 40 mg via INTRAVENOUS
  Filled 2020-04-07 (×2): qty 40

## 2020-04-07 MED ORDER — SENNOSIDES-DOCUSATE SODIUM 8.6-50 MG PO TABS
1.0000 | ORAL_TABLET | Freq: Two times a day (BID) | ORAL | Status: DC
  Administered 2020-04-07 – 2020-04-12 (×9): 1 via ORAL
  Filled 2020-04-07 (×9): qty 1

## 2020-04-07 MED ORDER — HYDROMORPHONE HCL 1 MG/ML IJ SOLN
1.0000 mg | INTRAMUSCULAR | Status: DC | PRN
  Administered 2020-04-08: 1 mg via INTRAVENOUS
  Filled 2020-04-07: qty 1

## 2020-04-07 MED ORDER — CYANOCOBALAMIN 500 MCG PO TABS
500.0000 ug | ORAL_TABLET | Freq: Every day | ORAL | Status: DC
  Administered 2020-04-08 – 2020-04-12 (×5): 500 ug via ORAL
  Filled 2020-04-07 (×6): qty 1

## 2020-04-07 MED ORDER — UMECLIDINIUM BROMIDE 62.5 MCG/INH IN AEPB
1.0000 | INHALATION_SPRAY | Freq: Every day | RESPIRATORY_TRACT | Status: DC
  Administered 2020-04-08 – 2020-04-12 (×5): 1 via RESPIRATORY_TRACT
  Filled 2020-04-07: qty 7

## 2020-04-07 MED ORDER — HYDROMORPHONE HCL 1 MG/ML IJ SOLN
1.0000 mg | Freq: Once | INTRAMUSCULAR | Status: AC
  Administered 2020-04-07: 1 mg via INTRAVENOUS
  Filled 2020-04-07: qty 1

## 2020-04-07 MED ORDER — VOXELOTOR 500 MG PO TABS: 1500.0000 mg | ORAL_TABLET | Freq: Every day | ORAL | Status: DC

## 2020-04-07 MED ORDER — SODIUM CHLORIDE 0.9 % IV SOLN: 10.0000 mL/h | Freq: Once | INTRAVENOUS | Status: DC

## 2020-04-07 MED ORDER — FAMOTIDINE 20 MG PO TABS: 10.0000 mg | ORAL_TABLET | Freq: Every day | ORAL | Status: DC | PRN

## 2020-04-07 MED ORDER — SODIUM CHLORIDE 0.9 % IV BOLUS (SEPSIS): 1000.0000 mL | Freq: Once | INTRAVENOUS | Status: DC

## 2020-04-07 MED ORDER — ONDANSETRON HCL 4 MG/2ML IJ SOLN: 4.0000 mg | INTRAMUSCULAR | Status: DC | PRN

## 2020-04-07 MED ORDER — VITAMIN D (ERGOCALCIFEROL) 1.25 MG (50000 UNIT) PO CAPS: 50000.0000 [IU] | ORAL_CAPSULE | ORAL | Status: DC

## 2020-04-07 MED ORDER — SODIUM CHLORIDE 0.45 % IV SOLN: INTRAVENOUS | Status: AC

## 2020-04-07 MED ORDER — SODIUM BICARBONATE 650 MG PO TABS
650.0000 mg | ORAL_TABLET | Freq: Every day | ORAL | Status: DC
  Administered 2020-04-07 – 2020-04-10 (×4): 650 mg via ORAL
  Filled 2020-04-07 (×4): qty 1

## 2020-04-07 MED ORDER — PANTOPRAZOLE SODIUM 40 MG PO TBEC
40.0000 mg | DELAYED_RELEASE_TABLET | Freq: Every day | ORAL | Status: DC
  Administered 2020-04-07 – 2020-04-12 (×5): 40 mg via ORAL
  Filled 2020-04-07 (×6): qty 1

## 2020-04-07 MED ORDER — ASCORBIC ACID 500 MG PO TABS
1000.0000 mg | ORAL_TABLET | Freq: Every day | ORAL | Status: DC
  Administered 2020-04-07 – 2020-04-12 (×6): 1000 mg via ORAL
  Filled 2020-04-07 (×6): qty 2

## 2020-04-07 MED ORDER — SODIUM CHLORIDE 0.9 % IV SOLN
2.0000 g | Freq: Once | INTRAVENOUS | Status: AC
  Administered 2020-04-07: 2 g via INTRAVENOUS
  Filled 2020-04-07: qty 2

## 2020-04-07 MED ORDER — ALBUTEROL SULFATE (2.5 MG/3ML) 0.083% IN NEBU: 2.5000 mg | INHALATION_SOLUTION | RESPIRATORY_TRACT | Status: DC | PRN

## 2020-04-07 MED ORDER — OXYCODONE HCL 5 MG PO TABS
5.0000 mg | ORAL_TABLET | ORAL | Status: DC | PRN
  Administered 2020-04-11: 5 mg via ORAL
  Filled 2020-04-07: qty 1

## 2020-04-07 MED ORDER — METRONIDAZOLE IN NACL 5-0.79 MG/ML-% IV SOLN
500.0000 mg | Freq: Once | INTRAVENOUS | Status: AC
  Administered 2020-04-07: 500 mg via INTRAVENOUS
  Filled 2020-04-07: qty 100

## 2020-04-07 MED ORDER — ONDANSETRON HCL 4 MG PO TABS: 4.0000 mg | ORAL_TABLET | ORAL | Status: DC | PRN

## 2020-04-07 MED ORDER — MELATONIN 5 MG PO TABS
10.0000 mg | ORAL_TABLET | Freq: Every day | ORAL | Status: DC
  Administered 2020-04-07 – 2020-04-11 (×5): 10 mg via ORAL
  Filled 2020-04-07 (×5): qty 2

## 2020-04-07 MED ORDER — HEPARIN SODIUM (PORCINE) 5000 UNIT/ML IJ SOLN
5000.0000 [IU] | Freq: Three times a day (TID) | INTRAMUSCULAR | Status: DC
Start: 2020-04-07 — End: 2020-04-07

## 2020-04-07 MED ORDER — VANCOMYCIN HCL IN DEXTROSE 1-5 GM/200ML-% IV SOLN
1000.0000 mg | Freq: Once | INTRAVENOUS | Status: AC
  Administered 2020-04-07: 1000 mg via INTRAVENOUS
  Filled 2020-04-07: qty 200

## 2020-04-07 MED ORDER — FLUTICASONE FUROATE-VILANTEROL 200-25 MCG/INH IN AEPB
1.0000 | INHALATION_SPRAY | Freq: Every day | RESPIRATORY_TRACT | Status: DC
  Administered 2020-04-08 – 2020-04-12 (×5): 1 via RESPIRATORY_TRACT
  Filled 2020-04-07: qty 28

## 2020-04-07 MED ORDER — ACETAMINOPHEN 500 MG PO TABS
500.0000 mg | ORAL_TABLET | Freq: Four times a day (QID) | ORAL | Status: DC | PRN
  Administered 2020-04-07: 500 mg via ORAL
  Filled 2020-04-07: qty 1

## 2020-04-07 MED ORDER — POLYETHYLENE GLYCOL 3350 17 G PO PACK: 17.0000 g | PACK | Freq: Every day | ORAL | Status: DC | PRN

## 2020-04-07 MED ORDER — SODIUM CHLORIDE 0.9% FLUSH
3.0000 mL | Freq: Once | INTRAVENOUS | Status: AC
  Administered 2020-04-07: 3 mL via INTRAVENOUS

## 2020-04-07 NOTE — H&P (Signed)
History and Physical    Shawn DollarLuke Padron ZOX:[id number] DOB: April 30, 1976 DOA: 04/07/2020  PCP: Mike Gipouglas, Andre, FNP   Chief Complaint: Abdominal pain  HPI: Shawn DollarLuke Shawn Adams is a 44 y.o. male with medical history significant of sickle cell disease, anemia, and sarcoid presents with 48h of diffuse abdominal pain and progressing to diffuse chest pain with questionable fever/chills at home and a single episode of loose stool during this timeframe. He also admits to extremely poor PO intake over the past 48h; he otherwise denies nausea, vomiting, headache, shortness of breath, or constipation.  ED Course: In ED labs were remarkable for leukocytosis of 32, creatinine of 7 and hemoglobin of 4. IV fluids were initiated, 2u PRBC was ordered and pain was well controlled on dilaudid. CT abdomen with no acute findings; RUQ US remarkable for cholelithiasis and no evidence of cholecystitis as well as a hypoechoic lesion of the R hepatic lobe. Given profound lab abnormalities hospitalist was called to admit.  Review of Systems: As per HPI above.   Assessment/Plan Active Problems:   Symptomatic anemia   AKI (acute kidney injury) (HCC)   Sickle cell anemia (HCC)   CKD (chronic kidney disease) stage 2, GFR 60-89 ml/min   Sickle cell pain crisis (HCC)   Sickle cell anemia with crisis (HCC)   Sickle cell crisis (HCC)   History of aneurysm   Heart murmur   Anemia of chronic disease   Sarcoidosis   Chronic pain syndrome   Sickle cell anemia with pain (HCC)    Acute symptomatic anemia in the setting of sickle cell disease/crisis; rule out blood loss, POA - Continue IVF - bolus 10-4320ml/kg as needed for occlusive pain/crisis - ED transfusing 2u PRBC - likely need an additional 1-2 units - repeat H/H after transfusion completed - Fecal occult negative - no signs/symptoms of blood loss/trauma/bleeding - Continue home sickle cell medications - Pain controlled as below (Dilaudid/oxycodone) - hold NSAIDs given  AKI  AKI on CKD2 secondary to above - Continue aggressive fluids as above; baseline creatinine around 1.2-1.4  - If worsening or not improving creatinine with fluids would sideline nephrology - Follow UOP - appears to be making urine adequately in the ED at admission Lab Results  Component Value Date   CREATININE 7.09 (H) 04/07/2020   CREATININE 7.06 (H) 04/07/2020   CREATININE 1.43 (H) 04/12/2019    Intake/Output Summary (Last 24 hours) at 04/07/2020 1937 Last data filed at 04/07/2020 1816 Gross per 24 hour  Intake 359.09 ml  Output 275 ml  Net 84.09 ml    Acute intractable abdominal pain - concerning for sickle cell crisis - Continue Dilaudid/oxy as above - avoid NSAIDs  Leukocytosis - likely leukemoid reaction given above - rule out SEPSIS (Patient does meet SIRS criteria without source) - Likely reactive - continue to follow - Hold all abx - received flagyl/cefepime/Vanc in ED x1 dose - Follow procalcitonin  Hypovolemic hyponatremia - Continue IVF as above  DVT prophylaxis: Heparin q8  Code Status: Full  Family Communication: None present  Status is: Inpt  Dispo: The patient is from: Home              Anticipated d/c is to: Home              Anticipated d/c date is: 48-72h pending clinical course              Patient currently NOT medically stable for discharge due to need for ongoing IV narcotics, IV fluids, and close monitoring  given high risk for morbidity and mortality.  Consultants:   Sickle cell team  Procedures:   None indicated   Past Medical History:  Diagnosis Date  . Aneurysm (HCC)    Pt states happened in 2011, Brain   . Heart murmur   . Sarcoid   . Sickle cell anemia (HCC)     Past Surgical History:  Procedure Laterality Date  . LYMPH NODE BIOPSY    . NASAL SINUS SURGERY       reports that he has quit smoking. His smoking use included cigarettes. He has never used smokeless tobacco. He reports previous alcohol use of about 1.0 standard  drink of alcohol per week. He reports previous drug use. Drug: Marijuana.  No Known Allergies  Family History  Problem Relation Age of Onset  . Diabetes Mother   . Hypertension Father   . Lupus Sister     Prior to Admission medications   Medication Sig Start Date End Date Taking? Authorizing Provider  acetaminophen (TYLENOL) 500 MG tablet Take 500 mg by mouth every 6 (six) hours as needed for moderate pain or fever (pain).    Yes [provider]  albuterol (PROVENTIL HFA;VENTOLIN HFA) 108 (90 Base) MCG/ACT inhaler Inhale 2 puffs into the lungs every 4 (four) hours as needed for wheezing or shortness of breath (cough, shortness of breath or wheezing.). Patient taking differently: Inhale 2 puffs into the lungs every 4 (four) hours as needed for wheezing or shortness of breath.  09/21/17  Yes Massie Maroon, FNP  Ascorbic Acid (VITAMIN C) 1000 MG tablet Take 1,000 mg by mouth daily.   Yes [provider]  budesonide-formoterol (SYMBICORT) 160-4.5 MCG/ACT inhaler Inhale 2 puffs into the lungs 2 (two) times daily. 05/10/18 04/07/20 Yes [provider]  cyclobenzaprine (FLEXERIL) 10 MG tablet Take 1 tablet (10 mg total) by mouth 3 (three) times daily as needed for muscle spasms. 04/09/18  Yes Street, [place], PA-C  docusate sodium (COLACE) 100 MG capsule Take 100 mg by mouth daily as needed for mild constipation.   Yes [provider]  ergocalciferol (VITAMIN D2) 1.25 MG (50000 UT) capsule Take 50,000 Units by mouth every 30 (thirty) days.  10/24/19  Yes [provider]  famotidine (PEPCID) 10 MG tablet Take 10 mg by mouth daily as needed for heartburn or indigestion.   Yes [provider]  folic acid (FOLVITE) 1 MG tablet Take 1 tablet (1 mg total) by mouth daily. 02/09/16  Yes Quentin Angst, MD  hydroxychloroquine (PLAQUENIL) 200 MG tablet Take 200 mg by mouth daily. 01/13/20  Yes [provider]  hydroxyurea (HYDREA) 500 MG  capsule Take 2 capsules (1,000 mg total) by mouth daily. May take with food to minimize GI side effects. Patient taking differently: Take 500 mg by mouth 2 (two) times daily. May take with food to minimize GI side effects. 02/09/16  Yes [name], [name], MD  INCRUSE ELLIPTA 62.5 MCG/INH AEPB Inhale 1 puff into the lungs daily. 03/17/20  Yes [provider]  Melatonin 10 MG TABS Take 10 mg by mouth at bedtime.   Yes [provider]  meloxicam (MOBIC) 15 MG tablet Take 15 mg by mouth daily. 03/10/20  Yes [provider]  ondansetron (ZOFRAN) 4 MG tablet Take 1 tablet (4 mg total) by mouth every 6 (six) hours. Patient taking differently: Take 4 mg by mouth every 8 (eight) hours as needed for nausea or vomiting.  12/21/15  Yes Hedges, Tinnie Gens, PA-C  OXBRYTA 500 MG TABS tablet Take 1,500 mg by mouth.  03/03/20  Yes [provider]  Oxycodone HCl 20 MG TABS Take 20 mg by mouth every 4 (four) hours as needed (pain).    Yes [provider]  sodium bicarbonate 650 MG tablet Take 650 mg by mouth daily.  10/14/19  Yes [provider]  vitamin B-12 (CYANOCOBALAMIN) 500 MCG tablet Take 500 mcg by mouth daily.   Yes [provider]  amoxicillin-clavulanate (AUGMENTIN) 875-125 MG tablet Take 1 tablet by mouth 2 (two) times daily. One po bid x 7 days Patient not taking: Reported on 04/07/2020 06/03/19   Arthor Captain, PA-C  predniSONE (DELTASONE) 10 MG tablet Take 40 mg by mouth daily. Patient not taking: Reported on 04/07/2020 04/09/19   [provider]    Physical Exam: Vitals:   04/07/20 1631 04/07/20 1700 04/07/20 1730 04/07/20 1843  BP: 119/72 114/61 (!) 111/58 (!) 150/66  Pulse: 83 83 88 (!) 106  Resp: (!) 24 20 (!) 22 (!) 30  Temp:    (!) 97.5 F (36.4 C)  TempSrc:    Oral  SpO2: 100% 100% 100% 99%  Weight:      Height:        Constitutional: NAD, calm, comfortable Vitals:   04/07/20 1631 04/07/20 1700 04/07/20 1730 04/07/20  1843  BP: 119/72 114/61 (!) 111/58 (!) 150/66  Pulse: 83 83 88 (!) 106  Resp: (!) 24 20 (!) 22 (!) 30  Temp:    (!) 97.5 F (36.4 C)  TempSrc:    Oral  SpO2: 100% 100% 100% 99%  Weight:      Height:       General:  Resting in bed, no acute distress HEENT:  Normocephalic atraumatic.  Sclerae nonicteric, noninjected.  Extraocular movements intact bilaterally. Neck:  Without mass or deformity.  Trachea is midline. Lungs:  Clear to auscultate bilaterally without rhonchi, wheeze, or rales. Heart:  Regular rate and rhythm.  Without murmurs, rubs, or gallops. Abdomen:  Diffusely tender, nondistended.  Without guarding or rebound. Extremities: Without cyanosis, clubbing, edema, or obvious deformity. Vascular:  Dorsalis pedis and posterior tibial pulses palpable bilaterally. Skin:  Warm and dry, no erythema, no ulcerations.  Labs on Admission: I have personally reviewed following labs and imaging studies  CBC: Recent Labs  Lab 04/07/20 1443  WBC 32.4*  HGB 4.4*  HCT 11.5*  MCV 92.7  PLT 253   Basic Metabolic Panel: Recent Labs  Lab 04/07/20 1443 04/07/20 1841  NA 130*  --   K 4.2  --   CL 105  --   CO2 11*  --   GLUCOSE 105*  --   BUN 104*  --   CREATININE 7.06* 7.09*  CALCIUM 8.8*  --    GFR: Estimated Creatinine Clearance: 11.6 mL/min (A) (by C-G formula based on SCr of 7.09 mg/dL (H)). Liver Function Tests: Recent Labs  Lab 04/07/20 1443  AST 48*  ALT 29  ALKPHOS 96  BILITOT 3.2*  PROT 7.5  ALBUMIN 3.4*   Recent Labs  Lab 04/07/20 1443 04/07/20 1552  LIPASE 58* 61*   No results for input(s): AMMONIA in the last 168 hours. Coagulation Profile: No results for input(s): INR, PROTIME in the last 168 hours. Cardiac Enzymes: No results for input(s): CKTOTAL, CKMB, CKMBINDEX, TROPONINI in the last 168 hours. BNP (last 3 results) No results for input(s): PROBNP in the last 8760 hours. HbA1C: No results for input(s): HGBA1C in the last 72  hours. CBG: No  results for input(s): GLUCAP in the last 168 hours. Lipid Profile: No results for input(s): CHOL, HDL, LDLCALC, TRIG, CHOLHDL, LDLDIRECT in the last 72 hours. Thyroid Function Tests: No results for input(s): TSH, T4TOTAL, FREET4, T3FREE, THYROIDAB in the last 72 hours. Anemia Panel: Recent Labs    04/07/20 1552  RETICCTPCT 6.3*   Urine analysis:    Component Value Date/Time   COLORURINE AMBER (A) 04/07/2020 1812   APPEARANCEUR TURBID (A) 04/07/2020 1812   APPEARANCEUR Clear 10/05/2017 1600   LABSPEC 1.009 04/07/2020 1812   PHURINE 6.0 04/07/2020 1812   GLUCOSEU NEGATIVE 04/07/2020 1812   HGBUR MODERATE (A) 04/07/2020 1812   BILIRUBINUR NEGATIVE 04/07/2020 1812   BILIRUBINUR neg 08/06/2018 1201   BILIRUBINUR Negative 10/05/2017 1600   KETONESUR NEGATIVE 04/07/2020 1812   PROTEINUR >=300 (A) 04/07/2020 1812   UROBILINOGEN 2.0 (A) 08/06/2018 1201   UROBILINOGEN 0.2 12/08/2017 1339   NITRITE NEGATIVE 04/07/2020 1812   LEUKOCYTESUR LARGE (A) 04/07/2020 1812    Radiological Exams on Admission: CT Abdomen Pelvis Wo Contrast  Result Date: 04/07/2020 CLINICAL DATA:  Abdominal pain and fever. History of sickle cell disease. EXAM: CT ABDOMEN AND PELVIS WITHOUT CONTRAST TECHNIQUE: Multidetector CT imaging of the abdomen and pelvis was performed following the standard protocol without IV contrast. COMPARISON:  None. FINDINGS: Lower chest: Scarring is identified within the posterior right lower lobe. No pleural effusion. Heart size is enlarged. Hepatobiliary: Hepatomegaly. Within the limitations of unenhanced technique there is no focal liver abnormality. Small stones are noted within the gallbladder measuring up to 4 mm. Pancreas: Unremarkable. No pancreatic ductal dilatation or surrounding inflammatory changes. Spleen: Auto infarcted calcified spleen. Adrenals/Urinary Tract: Normal appearance of the adrenal glands. No kidney mass or hydronephrosis. The urinary bladder is unremarkable.  Stomach/Bowel: Stomach appears normal. No bowel wall thickening, inflammation, or distension. Retro cecal appendix noted. No findings of acute appendicitis. Vascular/Lymphatic: Normal appearance of the abdominal aorta. No abdominopelvic adenopathy identified. No inguinal adenopathy. Reproductive: Prostate is unremarkable. Other: No free fluid or fluid collections. Musculoskeletal: Bony changes of sickle cell disease noted. No acute or suspicious osseous findings. IMPRESSION: 1. No acute findings identified within the abdomen or pelvis. 2. Gallstones. 3. Hepatomegaly. 4. Bony changes of sickle cell disease. Electronically Signed   By: Signa Kell M.D.   On: 04/07/2020 16:09   DG Chest Port 1 View  Result Date: 04/07/2020 CLINICAL DATA:  Chest pain EXAM: PORTABLE CHEST 1 VIEW COMPARISON:  09/26/2019 FINDINGS: Heart is borderline in size. Bulky mediastinal and hilar adenopathy, likely related to known sarcoidosis. This is stable since prior study. No confluent opacities or effusions. No acute bony abnormality. IMPRESSION: Borderline heart size. Mediastinal and hilar adenopathy compatible with sarcoidosis. No acute cardiopulmonary disease. Electronically Signed   By: Charlett Nose M.D.   On: 04/07/2020 16:25   US Abdomen Limited RUQ  Result Date: 04/07/2020 CLINICAL DATA:  Cholelithiasis EXAM: ULTRASOUND ABDOMEN LIMITED RIGHT UPPER QUADRANT COMPARISON:  Noncontrast CT abdomen pelvis 04/07/2020 FINDINGS: Gallbladder: At least 1 mobile gallstone is seen within the gallbladder fundus measuring 7 mm in diameter. The gallbladder, however, is not distended, there is no gallbladder wall thickening, and no pericholecystic fluid is identified. The sonographic Eulah Pont sign is reportedly negative. Common bile duct: Diameter: 6 mm in proximal diameter Liver: The left hepatic lobe is relatively small and there is enlargement of the right hepatic lobe in keeping with a Riedel's lobe variant. Parenchymal echogenicity and  echotexture is normal. The liver contour is  smooth. There is a circumscribed hypoechoic homogeneous solid lesion within the right hepatic lobe measuring 10 mm x 12 mm x 13 mm, best seen on image # [id number] which is indeterminate, but may represent a atypical hemangioma or post infectious or post inflammatory lesion. This is, however, indeterminate on this examination. Portal vein is patent on color Doppler imaging with normal direction of blood flow towards the liver. Other: Incidentally noted is diffusely increased renal cortical echogenicity of the right kidney suggesting changes of underlying medical renal disease. This appears slightly progressive since prior examination of 04/28/2016. Renal cortical thickness, however, is preserved. IMPRESSION: Cholelithiasis without sonographic evidence of acute cholecystitis. 13 mm hypoechoic lesion within the right hepatic lobe, indeterminate. This could be better assessed with contrast enhanced MRI examination if clinically indicated. Alternatively, a follow-up sonogram in 6 months may be helpful to document stability. Electronically Signed   By: Helyn Numbers MD   On: 04/07/2020 16:57    EKG: Independently reviewed. Sinus rhythm without overt ST elevation/depression - large R waves noted laterally.   Azucena Fallen DO [facility] For contact please use secure messenger on Epic  If 7PM-7AM, please contact night-coverage located on [email or web address]   04/07/2020, 7:37 PM

## 2020-04-07 NOTE — ED Notes (Signed)
Date and time results received: 04/07/20 3:13 PM  (use smartphrase ".now" to insert current time)  Test: Hgb Critical Value: 4.4  Name of Provider Notified: Steinl  Orders Received? Or Actions Taken?: Orders Received - See Orders for details

## 2020-04-07 NOTE — Progress Notes (Signed)
A consult was received from an ED physician for vancomycin and cefepime per pharmacy dosing.  The patient's profile has been reviewed for ht/wt/allergies/indication/available labs.   A one time order has been placed for vancomycin 1g and cefepime 2g.  Further antibiotics/pharmacy consults should be ordered by admitting physician if indicated.                       Thank you, Loralee Pacas, PharmD, BCPS 04/07/2020  3:49 PM

## 2020-04-07 NOTE — ED Triage Notes (Signed)
Patient c/o mid abdominal pain and feeling fatigue x 2 days.

## 2020-04-07 NOTE — ED Notes (Signed)
Patient transported to CT 

## 2020-04-07 NOTE — ED Notes (Signed)
Urinal and specimen containers at bedside for collection when available. [employer]

## 2020-04-07 NOTE — Progress Notes (Signed)
PHARMACY BRIEF NOTE: HYDROXYUREA   By [facility] policy, hydroxyurea is automatically held when any of the following laboratory values occur:  ANC < 2 K  Pltc < 80K in sickle-cell patients; < 100K in other patients  Hgb <= 6 in sickle-cell patients; < 8 in other patients (Hgb 4.4) Reticulocytes < 80K when Hgb < 9  Hydroxyurea has been held (discontinued from profile) per policy.    Loralee Pacas, PharmD, BCPS Pharmacy: [phone] 04/07/2020 6:23 PM

## 2020-04-07 NOTE — Sepsis Progress Note (Signed)
Notified bedside nurse of need to administer antibiotics.  

## 2020-04-07 NOTE — ED Provider Notes (Signed)
[facility]-EMERGENCY DEPT Provider Note   CSN: [id number] Arrival date & time: 04/07/20  1258     History Chief Complaint  Patient presents with  . Abdominal Pain  . Fatigue    Shawn Adams is a 44 y.o. male with PMHx sickle cell anemia, sarcoidosis, and brain aneurysm (2011) who presents to the ED today with complaint of gradual onset, constant, sharp, diffuse lower abdominal pain x 3 days. Pt also complains of fatigue, fevers with tmax 100.5, nausea, diarrhea, chest pain, and shortness of breath. Pt reports he has not eaten anything for the past few days due to severe pain. He states this does not feel like typical sickle cell crisis. Pt denies any recent sick contacts or suspicious food intake. No one in his house is sick. He denies any recent abx use. No bright red blood in his stool or melena. No vomiting.   The history is provided by the patient and medical records.       Past Medical History:  Diagnosis Date  . Aneurysm (HCC)    Pt states happened in 2011, Brain   . Heart murmur   . Sarcoid   . Sickle cell anemia [facility])     Patient Active Problem List   Diagnosis Date Noted  . Chronic pain syndrome   . Sarcoidosis 07/06/2018  . Persistent asthma without complication 07/06/2018  . Headache 05/03/2016  . Anemia of chronic disease 05/03/2016  . Headache disorder   . Vitamin D deficiency 02/09/2016  . Heart murmur 02/09/2016  . Recurrent occipital headache 10/15/2015  . History of aneurysm 10/13/2015  . Left hip pain 07/30/2015  . Left ankle pain 07/08/2015  . Sickle cell crisis (HCC) 03/08/2015  . 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 Sickle cell anemia with crisis (HCC) 12/07/2014  . Sickle cell anemia (HCC) 06/20/2014  . CKD (chronic kidney disease) stage 3, GFR 30-59 ml/min (HCC) 06/20/2014  . Leukocytosis 06/20/2014  . Sickle cell pain crisis (HCC) 06/20/2014  . Hypercalcemia 06/20/2014  . Symptomatic anemia 05/31/2014     Past Surgical History:  Procedure Laterality Date  . LYMPH NODE BIOPSY    . NASAL SINUS SURGERY         Family History  Problem Relation Age of Onset  . Diabetes Mother   . Hypertension Father   . Lupus Sister     Social History   Tobacco Use  . Smoking status: Former Smoker    Types: Cigarettes  . Smokeless tobacco: Never Used  Vaping Use  . Vaping Use: Never used  Substance Use Topics  . Alcohol use: Not Currently    Alcohol/week: 1.0 standard drink    Types: 1 Cans of beer per week    Comment: occasionally  . Drug use: Not Currently    Types: Marijuana    Home Medications Prior to Admission medications   Medication Sig Start Date End Date Taking? Authorizing Provider  acetaminophen (TYLENOL) 500 MG tablet Take 500 mg by mouth every 6 (six) hours as needed for moderate pain or fever (pain).    Yes [provider]  albuterol (PROVENTIL HFA;VENTOLIN HFA) 108 (90 Base) MCG/ACT inhaler Inhale 2 puffs into the lungs every 4 (four) hours as needed for wheezing or shortness of breath (cough, shortness of breath or wheezing.). Patient taking differently: Inhale 2 puffs into the lungs every 4 (four) hours as needed for wheezing or shortness of breath.  09/21/17  Yes Massie Maroon, FNP  Ascorbic  Acid (VITAMIN C) 1000 MG tablet Take 1,000 mg by mouth daily.   Yes [provider]  budesonide-formoterol (SYMBICORT) 160-4.5 MCG/ACT inhaler Inhale 2 puffs into the lungs 2 (two) times daily. 05/10/18 04/07/20 Yes [provider]  cyclobenzaprine (FLEXERIL) 10 MG tablet Take 1 tablet (10 mg total) by mouth 3 (three) times daily as needed for muscle spasms. 04/09/18  Yes Street, McIntosh, PA-C  docusate sodium (COLACE) 100 MG capsule Take 100 mg by mouth daily as needed for mild constipation.   Yes [provider]  ergocalciferol (VITAMIN D2) 1.25 MG (50000 UT) capsule Take 50,000 Units by mouth every 30 (thirty) days.  10/24/19  Yes [provider]  famotidine (PEPCID) 10 MG tablet Take 10 mg by mouth daily as needed for heartburn or indigestion.   Yes [provider]  folic acid (FOLVITE) 1 MG tablet Take 1 tablet (1 mg total) by mouth daily. 02/09/16  Yes Quentin Angst, MD  hydroxychloroquine (PLAQUENIL) 200 MG tablet Take 200 mg by mouth daily. 01/13/20  Yes [provider]  hydroxyurea (HYDREA) 500 MG capsule Take 2 capsules (1,000 mg total) by mouth daily. May take with food to minimize GI side effects. Patient taking differently: Take 500 mg by mouth 2 (two) times daily. May take with food to minimize GI side effects. 02/09/16  Yes [name], [name], MD  INCRUSE ELLIPTA 62.5 MCG/INH AEPB Inhale 1 puff into the lungs daily. 03/17/20  Yes [provider]  Melatonin 10 MG TABS Take 10 mg by mouth at bedtime.   Yes [provider]  meloxicam (MOBIC) 15 MG tablet Take 15 mg by mouth daily. 03/10/20  Yes [provider]  ondansetron (ZOFRAN) 4 MG tablet Take 1 tablet (4 mg total) by mouth every 6 (six) hours. Patient taking differently: Take 4 mg by mouth every 8 (eight) hours as needed for nausea or vomiting.  12/21/15  Yes Hedges, Tinnie Gens, PA-C  OXBRYTA 500 MG TABS tablet Take 1,500 mg by mouth.  03/03/20  Yes [provider]  Oxycodone HCl 20 MG TABS Take 20 mg by mouth every 4 (four) hours as needed (pain).    Yes [provider]  sodium bicarbonate 650 MG tablet Take 650 mg by mouth daily.  10/14/19  Yes [provider]  vitamin B-12 (CYANOCOBALAMIN) 500 MCG tablet Take 500 mcg by mouth daily.   Yes [provider]  amoxicillin-clavulanate (AUGMENTIN) 875-125 MG tablet Take 1 tablet by mouth 2 (two) times daily. One po bid x 7 days Patient not taking: Reported on 04/07/2020 06/03/19   Arthor Captain, PA-C  predniSONE (DELTASONE) 10 MG tablet Take 40 mg by mouth daily. Patient not taking: Reported on 04/07/2020 04/09/19   [provider]      Allergies    Patient has no known allergies.  Review of Systems   Review of Systems  Constitutional: Positive for chills, fatigue and fever.  Respiratory: Positive for shortness of breath and wheezing (reports baseline). Negative for cough.   Cardiovascular: Positive for chest pain.  Gastrointestinal: Positive for abdominal pain, diarrhea and nausea. Negative for blood in stool and vomiting.  All other systems reviewed and are negative.   Physical Exam Updated Vital Signs BP 118/69   Pulse 92   Temp (!) 97.4 F (36.3 C) (Oral)   Resp (!) 23   Ht 6' (1.829 m)   Wt 61.2 kg   SpO2 98%   BMI 18.31 kg/m   Physical Exam Vitals  and nursing note reviewed.  Constitutional:      Appearance: He is not ill-appearing or diaphoretic.  HENT:     Head: Normocephalic and atraumatic.  Eyes:     General: Scleral icterus present.     Conjunctiva/sclera: Conjunctivae normal.  Cardiovascular:     Rate and Rhythm: Normal rate and regular rhythm.  Pulmonary:     Effort: Pulmonary effort is normal.     Breath sounds: Normal breath sounds. No wheezing, rhonchi or rales.  Abdominal:     General: Abdomen is flat.     Palpations: Abdomen is soft.     Tenderness: There is generalized abdominal tenderness. There is no right CVA tenderness, left CVA tenderness, guarding or rebound.  Genitourinary:    Rectum: Guaiac result negative.  Musculoskeletal:     Cervical back: Neck supple.  Skin:    General: Skin is warm and dry.     Coloration: Skin is pale.  Neurological:     Mental Status: He is alert.     ED Results / Procedures / Treatments   Labs (all labs ordered are listed, but only abnormal results are displayed) Labs Reviewed  LIPASE, BLOOD - Abnormal; Notable for the following components:      Result Value   Lipase 58 (*)    All other components within normal limits  COMPREHENSIVE METABOLIC PANEL - Abnormal; Notable for the following components:   Sodium 130 (*)    CO2 11 (*)     Glucose, Bld 105 (*)    BUN 104 (*)    Creatinine, Ser 7.06 (*)    Calcium 8.8 (*)    Albumin 3.4 (*)    AST 48 (*)    Total Bilirubin 3.2 (*)    GFR calc non Af Amer 9 (*)    GFR calc Af Amer 10 (*)    All other components within normal limits  CBC - Abnormal; Notable for the following components:   WBC 32.4 (*)    RBC 1.24 (*)    Hemoglobin 4.4 (*)    HCT 11.5 (*)    MCH 35.5 (*)    MCHC 38.3 (*)    RDW 19.9 (*)    nRBC 0.3 (*)    All other components within normal limits  LIPASE, BLOOD - Abnormal; Notable for the following components:   Lipase 61 (*)    All other components within normal limits  RETICULOCYTES - Abnormal; Notable for the following components:   Retic Ct Pct 6.3 (*)    RBC. 1.17 (*)    All other components within normal limits  CULTURE, BLOOD (ROUTINE X 2)  CULTURE, BLOOD (ROUTINE X 2)  URINE CULTURE  SARS CORONAVIRUS 2 BY RT PCR (HOSPITAL ORDER, PERFORMED IN [facility])  LACTIC ACID, PLASMA  URINALYSIS, ROUTINE W REFLEX MICROSCOPIC  LACTIC ACID, PLASMA  POC OCCULT BLOOD, ED  TYPE AND SCREEN  PREPARE RBC (CROSSMATCH)  TROPONIN I (HIGH SENSITIVITY)  TROPONIN I (HIGH SENSITIVITY)    EKG EKG Interpretation  Date/Time:  Tuesday April 07 2020 15:43:20 EDT Ventricular Rate:  85 PR Interval:    QRS Duration: 95 QT Interval:  376 QTC Calculation: 448 R Axis:   53 Text Interpretation: Sinus rhythm Abnormal R-wave progression, early transition Left ventricular hypertrophy ST elevation suggests acute pericarditis No acute changes No significant change since last tracing Confirmed by Derwood Kaplan [phone]) on 04/07/2020 4:06:02 PM   Radiology CT Abdomen Pelvis Wo Contrast  Result Date: 04/07/2020 CLINICAL  DATA:  Abdominal pain and fever. History of sickle cell disease. EXAM: CT ABDOMEN AND PELVIS WITHOUT CONTRAST TECHNIQUE: Multidetector CT imaging of the abdomen and pelvis was performed following the standard protocol without IV  contrast. COMPARISON:  None. FINDINGS: Lower chest: Scarring is identified within the posterior right lower lobe. No pleural effusion. Heart size is enlarged. Hepatobiliary: Hepatomegaly. Within the limitations of unenhanced technique there is no focal liver abnormality. Small stones are noted within the gallbladder measuring up to 4 mm. Pancreas: Unremarkable. No pancreatic ductal dilatation or surrounding inflammatory changes. Spleen: Auto infarcted calcified spleen. Adrenals/Urinary Tract: Normal appearance of the adrenal glands. No kidney mass or hydronephrosis. The urinary bladder is unremarkable. Stomach/Bowel: Stomach appears normal. No bowel wall thickening, inflammation, or distension. Retro cecal appendix noted. No findings of acute appendicitis. Vascular/Lymphatic: Normal appearance of the abdominal aorta. No abdominopelvic adenopathy identified. No inguinal adenopathy. Reproductive: Prostate is unremarkable. Other: No free fluid or fluid collections. Musculoskeletal: Bony changes of sickle cell disease noted. No acute or suspicious osseous findings. IMPRESSION: 1. No acute findings identified within the abdomen or pelvis. 2. Gallstones. 3. Hepatomegaly. 4. Bony changes of sickle cell disease. Electronically Signed   By: Signa Kellaylor  Stroud M.D.   On: 04/07/2020 16:09   DG Chest Port 1 View  Result Date: 04/07/2020 CLINICAL DATA:  Chest pain EXAM: PORTABLE CHEST 1 VIEW COMPARISON:  09/26/2019 FINDINGS: Heart is borderline in size. Bulky mediastinal and hilar adenopathy, likely related to known sarcoidosis. This is stable since prior study. No confluent opacities or effusions. No acute bony abnormality. IMPRESSION: Borderline heart size. Mediastinal and hilar adenopathy compatible with sarcoidosis. No acute cardiopulmonary disease. Electronically Signed   By: Charlett NoseKevin  Dover M.D.   On: 04/07/2020 16:25   US Abdomen Limited RUQ  Result Date: 04/07/2020 CLINICAL DATA:  Cholelithiasis EXAM: ULTRASOUND ABDOMEN  LIMITED RIGHT UPPER QUADRANT COMPARISON:  Noncontrast CT abdomen pelvis 04/07/2020 FINDINGS: Gallbladder: At least 1 mobile gallstone is seen within the gallbladder fundus measuring 7 mm in diameter. The gallbladder, however, is not distended, there is no gallbladder wall thickening, and no pericholecystic fluid is identified. The sonographic Eulah PontMurphy sign is reportedly negative. Common bile duct: Diameter: 6 mm in proximal diameter Liver: The left hepatic lobe is relatively small and there is enlargement of the right hepatic lobe in keeping with a Riedel's lobe variant. Parenchymal echogenicity and echotexture is normal. The liver contour is smooth. There is a circumscribed hypoechoic homogeneous solid lesion within the right hepatic lobe measuring 10 mm x 12 mm x 13 mm, best seen on image # [id number] which is indeterminate, but may represent a atypical hemangioma or post infectious or post inflammatory lesion. This is, however, indeterminate on this examination. Portal vein is patent on color Doppler imaging with normal direction of blood flow towards the liver. Other: Incidentally noted is diffusely increased renal cortical echogenicity of the right kidney suggesting changes of underlying medical renal disease. This appears slightly progressive since prior examination of 04/28/2016. Renal cortical thickness, however, is preserved. IMPRESSION: Cholelithiasis without sonographic evidence of acute cholecystitis. 13 mm hypoechoic lesion within the right hepatic lobe, indeterminate. This could be better assessed with contrast enhanced MRI examination if clinically indicated. Alternatively, a follow-up sonogram in 6 months may be helpful to document stability. Electronically Signed   By: Helyn NumbersAshesh  Parikh MD   On: 04/07/2020 16:57    Procedures .Critical Care Performed by: Tanda RockersVenter, Devario Bucklew, PA-C Authorized by: Tanda RockersVenter, Riad Wagley, PA-C   Critical care provider statement:    Critical  care time (minutes):  45   Critical care  was necessary to treat or prevent imminent or life-threatening deterioration of the following conditions:  Sepsis (anemia requiring blood transfusion)   Critical care was time spent personally by me on the following activities:  Discussions with consultants, evaluation of patient's response to treatment, examination of patient, ordering and performing treatments and interventions, ordering and review of laboratory studies, ordering and review of radiographic studies, pulse oximetry, re-evaluation of patient's condition, obtaining history from patient or surrogate and review of old charts   (including critical care time)  Medications Ordered in ED Medications  vancomycin (VANCOCIN) IVPB 1000 mg/200 mL premix (1,000 mg Intravenous New Bag/Given 04/07/20 1707)  0.45 % sodium chloride infusion ( Intravenous New Bag/Given (Non-Interop) 04/07/20 1614)  HYDROmorphone (DILAUDID) injection 1 mg (has no administration in time range)  sodium chloride flush (NS) 0.9 % injection 3 mL (3 mLs Intravenous Given 04/07/20 1620)  ceFEPIme (MAXIPIME) 2 g in sodium chloride 0.9 % 100 mL IVPB (0 g Intravenous Stopped 04/07/20 1650)  metroNIDAZOLE (FLAGYL) IVPB 500 mg (0 mg Intravenous Stopped 04/07/20 1740)    ED Course  I have reviewed the triage vital signs and the nursing notes.  Pertinent labs & imaging results that were available during my care of the patient were reviewed by me and considered in my medical decision making (see chart for details).  Clinical Course as of Apr 07 1805  Tue Apr 07, 2020  1549 Hemoglobin(!!): 4.4 [MV]  1723 WBC(!): 32.4 [MV]  1723 Creatinine(!): 7.06 [MV]  1723 Fecal Occult Blood, POC: NEGATIVE [MV]    Clinical Course User Index [MV] Tanda Rockers, PA-C   MDM Rules/Calculators/A&P                          44 year old male with a history of sickle cell anemia presents to the ED today complaining of diffuse abdominal pain as well as chest pain for the past 3 days with  associated nausea, diarrhea, fevers with T-max 100.5.  On arrival to the ED patient is afebrile 97.4.  He is nontachycardic at 60.  Blood pressure soft at 100/58.  Nontachypneic.  He appears pale on exam. He is noted to have scleral icterus.  His lungs are clear to auscultation bilaterally however he does state he has felt short of breath.  He has diffuse abdominal tenderness to palpation. CBC was obtained while pt was in the waiting room with critical value Hgb 4.4 and WBC 32.4. Will work up for sepsis at this time with suspected source either intraabdominal vs pulmonary. CT A/P ordered. Will also plan for CXR and troponin given chest pain and SOB. Will start on empiric abx and plan for blood transfusion with 2 units PRBCs.   Hemoglobin  Date Value Ref Range Status  04/07/2020 4.4 (LL) 13.0 - 17.0 g/dL Final    Comment:    REPEATED TO VERIFY THIS CRITICAL RESULT HAS VERIFIED AND BEEN CALLED TO MCIVER,M. RN BY KATHLEEN COHEN ON 07 13 2021 AT 1531, AND HAS BEEN READ BACK. CRITICAL RESULT VERIFIED   04/12/2019 6.7 (LL) 13.0 - 17.0 g/dL Final    Comment:    REPEATED TO VERIFY THIS CRITICAL RESULT HAS VERIFIED AND BEEN CALLED TO J FRICKEY RN BY AMELIA NAVARRO ON 07 17 2020 AT 0438, AND HAS BEEN READ BACK.    02/06/2019 5.8 (LL) 13.0 - 17.0 g/dL Final    Comment:    REPEATED TO VERIFY  THIS CRITICAL RESULT HAS VERIFIED AND BEEN CALLED TO R.GROVES BY NATHAN THOMPSON ON 05 13 2020 AT 2137, AND HAS BEEN READ BACK. CRITICAL RESULT VERIFIED   09/13/2018 6.3 (LL) 13.0 - 17.0 g/dL Final    Comment:    This critical result has verified and been called to [facility], A. RN by Patrina Levering on 12 19 2019 at 0530, and has been read back. CRITICAL RESULT VERIFIED  05/08/2018 6.2 (LL) 13.0 - 17.7 g/dL Final    Comment:    **Result Repeated**  05/04/2018 5.6 (LL) 13.0 - 17.7 g/dL Final    Comment:    **CBC Results Repeated**  12/08/2017 6.8 (LL) 13.0 - 17.7 g/dL Final  16/06/9603 6.7 (LL) 13.0 - 17.7 g/dL  Final   Received call from radiology - creatinine has returned elevated at 7.06. BUN 104 and bicarb 11. Will change CT to noncon. Will add on bladder scan.   Lab Results  Component Value Date   CREATININE 7.06 (H) 04/07/2020   CREATININE 1.43 (H) 04/12/2019   CREATININE 1.53 (H) 02/06/2019   Lipase 61.   CT scan without acute findings; gallstones appreciated. Will obtain RUQ ultrasound to rule out cholecystitis given this could be causing his abd pain and chest pain.   CXR clear.  RUQ does show lesion in the right hepatic lobe; recommends MRI or repeat ultrasound in 6 months time. No evidence of acute cholecystitis.   Still awaiting U/A at this time. Difficult to say where pt's fever is coming from and if leukocytosis is present s/2 sepsis vs reaction to hgb decrease vs sickle cell reaction. Will consult hospitalist for admission at this time.   Discussed case with [facility] Dr. Natale Milch who agrees to evaluate patient for admission.   This note was prepared using Dragon voice recognition software and may include unintentional dictation errors due to the inherent limitations of voice recognition software.  Final Clinical Impression(s) / ED Diagnoses Final diagnoses:  Abdominal pain  Anemia, unspecified type  Sepsis with acute renal failure without septic shock, due to unspecified organism, unspecified acute renal failure type [facility])  Chest pain, unspecified type    Rx / DC Orders ED Discharge Orders    None       Tanda Rockers, PA-C 04/07/20 1806    Derwood Kaplan, MD 04/13/20 [phone]

## 2020-04-07 NOTE — ED Notes (Signed)
Attempted to call report x 1. Receiving RN is giving direct patient care. Will continue to monitor.

## 2020-04-08 ENCOUNTER — Inpatient Hospital Stay (HOSPITAL_COMMUNITY): Payer: Medicare HMO

## 2020-04-08 DIAGNOSIS — N182 Chronic kidney disease, stage 2 (mild): Secondary | ICD-10-CM

## 2020-04-08 DIAGNOSIS — A419 Sepsis, unspecified organism: Secondary | ICD-10-CM

## 2020-04-08 DIAGNOSIS — D649 Anemia, unspecified: Secondary | ICD-10-CM

## 2020-04-08 DIAGNOSIS — D638 Anemia in other chronic diseases classified elsewhere: Secondary | ICD-10-CM

## 2020-04-08 DIAGNOSIS — R6520 Severe sepsis without septic shock: Secondary | ICD-10-CM

## 2020-04-08 DIAGNOSIS — G894 Chronic pain syndrome: Secondary | ICD-10-CM

## 2020-04-08 DIAGNOSIS — R1030 Lower abdominal pain, unspecified: Secondary | ICD-10-CM

## 2020-04-08 DIAGNOSIS — N179 Acute kidney failure, unspecified: Secondary | ICD-10-CM

## 2020-04-08 LAB — BLOOD CULTURE ID PANEL (REFLEXED)

## 2020-04-08 LAB — CBC
HCT: 14.7 % — ABNORMAL LOW (ref 39.0–52.0)
HCT: 16.2 % — ABNORMAL LOW (ref 39.0–52.0)
Hemoglobin: 5.4 g/dL — CL (ref 13.0–17.0)
Hemoglobin: 5.9 g/dL — CL (ref 13.0–17.0)
MCH: 33.1 pg (ref 26.0–34.0)
MCH: 32.6 pg (ref 26.0–34.0)
MCHC: 36.7 g/dL — ABNORMAL HIGH (ref 30.0–36.0)
MCHC: 36.4 g/dL — ABNORMAL HIGH (ref 30.0–36.0)
MCV: 90.2 fL (ref 80.0–100.0)
MCV: 89.5 fL (ref 80.0–100.0)
Platelets: 217 10*3/uL (ref 150–400)
Platelets: 217 10*3/uL (ref 150–400)
RBC: 1.63 MIL/uL — ABNORMAL LOW (ref 4.22–5.81)
RBC: 1.81 MIL/uL — ABNORMAL LOW (ref 4.22–5.81)
RDW: 17.8 % — ABNORMAL HIGH (ref 11.5–15.5)
RDW: 17.6 % — ABNORMAL HIGH (ref 11.5–15.5)
WBC: 23.8 10*3/uL — ABNORMAL HIGH (ref 4.0–10.5)
WBC: 21.2 10*3/uL — ABNORMAL HIGH (ref 4.0–10.5)
nRBC: 0.5 % — ABNORMAL HIGH (ref 0.0–0.2)
nRBC: 0.7 % — ABNORMAL HIGH (ref 0.0–0.2)

## 2020-04-08 LAB — COMPREHENSIVE METABOLIC PANEL
ALT: 24 U/L (ref 0–44)
AST: 36 U/L (ref 15–41)
Albumin: 3 g/dL — ABNORMAL LOW (ref 3.5–5.0)
Alkaline Phosphatase: 88 U/L (ref 38–126)
Anion gap: 13 (ref 5–15)
BUN: 114 mg/dL — ABNORMAL HIGH (ref 6–20)
CO2: 10 mmol/L — ABNORMAL LOW (ref 22–32)
Calcium: 8.4 mg/dL — ABNORMAL LOW (ref 8.9–10.3)
Chloride: 105 mmol/L (ref 98–111)
Creatinine, Ser: 7.07 mg/dL — ABNORMAL HIGH (ref 0.61–1.24)
GFR calc Af Amer: 10 mL/min — ABNORMAL LOW (ref >60)
GFR calc non Af Amer: 9 mL/min — ABNORMAL LOW (ref >60)
Glucose, Bld: 104 mg/dL — ABNORMAL HIGH (ref 70–99)
Potassium: 4.1 mmol/L (ref 3.5–5.1)
Sodium: 128 mmol/L — ABNORMAL LOW (ref 135–145)
Total Bilirubin: 3 mg/dL — ABNORMAL HIGH (ref 0.3–1.2)
Total Protein: 6.6 g/dL (ref 6.5–8.1)

## 2020-04-08 LAB — SODIUM, URINE, RANDOM: Sodium, Ur: 47 mmol/L

## 2020-04-08 LAB — BASIC METABOLIC PANEL
Anion gap: 15 (ref 5–15)
BUN: 113 mg/dL — ABNORMAL HIGH (ref 6–20)
CO2: 12 mmol/L — ABNORMAL LOW (ref 22–32)
Calcium: 8.7 mg/dL — ABNORMAL LOW (ref 8.9–10.3)
Chloride: 104 mmol/L (ref 98–111)
Creatinine, Ser: 7.43 mg/dL — ABNORMAL HIGH (ref 0.61–1.24)
GFR calc Af Amer: 9 mL/min — ABNORMAL LOW (ref >60)
GFR calc non Af Amer: 8 mL/min — ABNORMAL LOW (ref >60)
Glucose, Bld: 98 mg/dL (ref 70–99)
Potassium: 4.4 mmol/L (ref 3.5–5.1)
Sodium: 131 mmol/L — ABNORMAL LOW (ref 135–145)

## 2020-04-08 LAB — HIV ANTIBODY (ROUTINE TESTING W REFLEX): HIV Screen 4th Generation wRfx: NONREACTIVE

## 2020-04-08 LAB — PREPARE RBC (CROSSMATCH)

## 2020-04-08 LAB — PROCALCITONIN: Procalcitonin: 59.22 ng/mL

## 2020-04-08 LAB — CREATININE, URINE, RANDOM: Creatinine, Urine: 59.22 mg/dL

## 2020-04-08 MED ORDER — SODIUM CHLORIDE 0.45 % IV SOLN: INTRAVENOUS | Status: DC

## 2020-04-08 MED ORDER — SODIUM CHLORIDE 0.9% IV SOLUTION: Freq: Once | INTRAVENOUS | Status: AC

## 2020-04-08 MED ORDER — HYDROMORPHONE 1 MG/ML IV SOLN: INTRAVENOUS | Status: DC

## 2020-04-08 MED ORDER — NALOXONE HCL 0.4 MG/ML IJ SOLN: 0.4000 mg | INTRAMUSCULAR | Status: DC | PRN

## 2020-04-08 MED ORDER — ACETAMINOPHEN 325 MG PO TABS
650.0000 mg | ORAL_TABLET | Freq: Once | ORAL | Status: AC
  Administered 2020-04-08: 650 mg via ORAL
  Filled 2020-04-08: qty 2

## 2020-04-08 MED ORDER — ONDANSETRON HCL 4 MG/2ML IJ SOLN: 4.0000 mg | Freq: Four times a day (QID) | INTRAMUSCULAR | Status: DC | PRN

## 2020-04-08 MED ORDER — SODIUM CHLORIDE 0.9 % IV SOLN: 2.0000 g | INTRAVENOUS | Status: DC

## 2020-04-08 MED ORDER — SODIUM CHLORIDE 0.9% FLUSH: 9.0000 mL | INTRAVENOUS | Status: DC | PRN

## 2020-04-08 MED ORDER — SODIUM CHLORIDE 0.45 % IV SOLN: INTRAVENOUS | Status: AC

## 2020-04-08 MED ORDER — SODIUM CHLORIDE 0.9 % IV SOLN
2.0000 g | INTRAVENOUS | Status: DC
  Administered 2020-04-08 – 2020-04-12 (×5): 2 g via INTRAVENOUS
  Filled 2020-04-08 (×5): qty 2

## 2020-04-08 MED ORDER — DIPHENHYDRAMINE HCL 25 MG PO CAPS
25.0000 mg | ORAL_CAPSULE | Freq: Once | ORAL | Status: AC
  Administered 2020-04-08: 25 mg via ORAL
  Filled 2020-04-08: qty 1

## 2020-04-08 MED ORDER — DIPHENHYDRAMINE HCL 50 MG/ML IJ SOLN: 12.5000 mg | Freq: Four times a day (QID) | INTRAMUSCULAR | Status: DC | PRN

## 2020-04-08 MED ORDER — HYDROMORPHONE HCL 1 MG/ML IJ SOLN
1.0000 mg | INTRAMUSCULAR | Status: DC | PRN
  Administered 2020-04-08 – 2020-04-11 (×9): 1 mg via INTRAVENOUS
  Filled 2020-04-08 (×9): qty 1

## 2020-04-08 MED ORDER — DIPHENHYDRAMINE HCL 12.5 MG/5ML PO ELIX: 12.5000 mg | ORAL_SOLUTION | Freq: Four times a day (QID) | ORAL | Status: DC | PRN

## 2020-04-08 NOTE — Consult Note (Addendum)
[facility]  Total days of antibiotics 2       Reason for Consult: e.coli sepsis   Referring Physician: [email or web address]  Active Problems:   Anemia   AKI (acute kidney injury) (HCC)   Sickle cell anemia (HCC)   CKD (chronic kidney disease) stage 2, GFR 60-89 ml/min   Sickle cell pain crisis (HCC)   Sickle cell anemia with crisis (HCC)   Sickle cell crisis (HCC)   History of aneurysm   Heart murmur   Anemia of chronic disease   Sarcoidosis   Chronic pain syndrome   Sickle cell anemia with pain (HCC)   Sepsis with acute renal failure without septic shock (HCC)    HPI: Shawn Adams is a 44 y.o. male with sickle cell disease, hx of CKD2 , sarcoidosis, ACD who was admitted on 7/13 for 2 day history of abdominal pain, with fever/chills. He had poor po intake but no significant n/v/nor diarrhea. Denies dysuria. In the ED he was found to have WBC of 32K, hgb 4, AKI with Cr 7, concerning for sickle crisis due to underlying infection. He was started on RBC transfusion plus empiric abtx. His CT of abdomen was unrevealing and RUQ U/S did not show cholecystis but had some cholelithiasis. He remained afebrile, but has had improvement with leukocytosis. His infectious work up has revealed ecoli bacteremia.     Past Medical History:  Diagnosis Date  . Aneurysm (HCC)    Pt states happened in 2011, Brain   . Heart murmur   . Sarcoid   . Sickle cell anemia (HCC)     Allergies: No Known Allergies  MEDICATIONS: . vitamin C  1,000 mg Oral Daily  . fluticasone furoate-vilanterol  1 puff Inhalation Daily  . folic acid  1 mg Oral Daily  . hydroxychloroquine  200 mg Oral Daily  . melatonin  10 mg Oral QHS  . pantoprazole  40 mg Oral Daily   Or  . pantoprazole (PROTONIX) IV  40 mg Intravenous Daily  . senna-docusate  1 tablet Oral BID  . sodium bicarbonate  650 mg Oral Daily  . umeclidinium bromide  1 puff Inhalation Daily  . vitamin B-12  500 mcg Oral Daily  . [START ON  04/24/2020] Vitamin D (Ergocalciferol)  50,000 Units Oral Q30 days  . voxelotor  1,500 mg Oral Daily    Social History   Tobacco Use  . Smoking status: Former Smoker    Types: Cigarettes  . Smokeless tobacco: Never Used  Vaping Use  . Vaping Use: Never used  Substance Use Topics  . Alcohol use: Not Currently    Alcohol/week: 1.0 standard drink    Types: 1 Cans of beer per week    Comment: occasionally  . Drug use: Not Currently    Types: Marijuana    Family History  Problem Relation Age of Onset  . Diabetes Mother   . Hypertension Father   . Lupus Sister      Review of Systems  Constitutional: + for fever, chills, diaphoresis, activity change, appetite change, fatigue and unexpected weight change.  HENT: Negative for congestion, sore throat, rhinorrhea, sneezing, trouble swallowing and sinus pressure.  Eyes: Negative for photophobia and visual disturbance.  Respiratory: Negative for cough, chest tightness, shortness of breath, wheezing and stridor.  Cardiovascular: Negative for chest pain, palpitations and leg swelling.  Gastrointestinal: + abdominal pain. Negative for nausea, vomiting, , diarrhea, constipation, blood in stool, abdominal distention and anal bleeding.  Genitourinary: Negative for dysuria, hematuria, flank pain and difficulty urinating.  Musculoskeletal: Negative for myalgias, back pain, joint swelling, arthralgias and gait problem.  Skin: Negative for color change, pallor, rash and wound.  Neurological: Negative for dizziness, tremors, weakness and light-headedness.  Hematological: Negative for adenopathy. Does not bruise/bleed easily.  Psychiatric/Behavioral: Negative for behavioral problems, confusion, sleep disturbance, dysphoric mood, decreased concentration and agitation.     OBJECTIVE: Temp:  [97.5 F (36.4 C)-99.4 F (37.4 C)] 98.3 F (36.8 C) (07/14 1257) Pulse Rate:  [85-109] 85 (07/14 1257) Resp:  [18-32] 20 (07/14 1257) BP:  (106-150)/(52-67) 118/63 (07/14 1257) SpO2:  [90 %-100 %] 99 % (07/14 1257) Physical Exam  Constitutional: He is oriented to person, place, and time. He appears well-developed and well-nourished. No distress.  HENT:  Mouth/Throat: Oropharynx is clear and moist. No oropharyngeal exudate.  Cardiovascular: Normal rate, regular rhythm and normal heart sounds. Exam reveals no gallop and no friction rub.  No murmur heard.  Pulmonary/Chest: Effort normal and breath sounds normal. No respiratory distress. He has no wheezes.  Abdominal: Soft. Bowel sounds are normal. He exhibits no distension. There is no tenderness.  Lymphadenopathy:  He has no cervical adenopathy.  Neurological: He is alert and oriented to person, place, and time.  Skin: Skin is warm and dry. No rash noted. No erythema.  Psychiatric: He has a normal mood and affect. His behavior is normal.     LABS: Results for orders placed or performed during the hospital encounter of 04/07/20 (from the past 48 hour(s))  Lipase, blood     Status: Abnormal   Collection Time: 04/07/20  2:43 PM  Result Value Ref Range   Lipase 58 (H) 11 - 51 U/L    Comment: Performed at [facility], [address], [place], [place] [address]  Comprehensive metabolic panel     Status: Abnormal   Collection Time: 04/07/20  2:43 PM  Result Value Ref Range   Sodium 130 (L) 135 - 145 mmol/L   Potassium 4.2 3.5 - 5.1 mmol/L   Chloride 105 98 - 111 mmol/L   CO2 11 (L) 22 - 32 mmol/L   Glucose, Bld 105 (H) 70 - 99 mg/dL    Comment: Glucose reference range applies only to samples taken after fasting for at least 8 hours.   BUN 104 (H) 6 - 20 mg/dL    Comment: RESULTS CONFIRMED BY MANUAL DILUTION   Creatinine, Ser 7.06 (H) 0.61 - 1.24 mg/dL   Calcium 8.8 (L) 8.9 - 10.3 mg/dL   Total Protein 7.5 6.5 - 8.1 g/dL   Albumin 3.4 (L) 3.5 - 5.0 g/dL   AST 48 (H) 15 - 41 U/L   ALT 29 0 - 44 U/L   Alkaline Phosphatase 96 38 - 126 U/L   Total  Bilirubin 3.2 (H) 0.3 - 1.2 mg/dL   GFR calc non Af Amer 9 (L) >60 mL/min   GFR calc Af Amer 10 (L) >60 mL/min   Anion gap 14 5 - 15    Comment: Performed at [facility], [address] [place], [place] [address]  CBC     Status: Abnormal   Collection Time: 04/07/20  2:43 PM  Result Value Ref Range   WBC 32.4 (H) 4.0 - 10.5 K/uL   RBC 1.24 (L) 4.22 - 5.81 MIL/uL   Hemoglobin 4.4 (LL) 13.0 - 17.0 g/dL    Comment: REPEATED TO VERIFY THIS CRITICAL RESULT HAS VERIFIED AND BEEN CALLED TO  MCIVER,M. RN BY KATHLEEN COHEN ON 07 13 2021 AT 1531, AND HAS BEEN READ BACK. CRITICAL RESULT VERIFIED    HCT 11.5 (L) 39 - 52 %   MCV 92.7 80.0 - 100.0 fL   MCH 35.5 (H) 26.0 - 34.0 pg   MCHC 38.3 (H) 30.0 - 36.0 g/dL   RDW 16.1 (H) 09.6 - 04.5 %   Platelets 253 150 - 400 K/uL   nRBC 0.3 (H) 0.0 - 0.2 %    Comment: Performed at [facility], [address], [place], [place] [address]  Culture, blood (routine x 2)     Status: None (Preliminary result)   Collection Time: 04/07/20  3:52 PM   Specimen: BLOOD  Result Value Ref Range   Specimen Description      BLOOD RIGHT ANTECUBITAL Performed at [facility], [address], [place], [place] [address]    Special Requests      BOTTLES DRAWN AEROBIC AND ANAEROBIC Blood Culture adequate volume Performed at [facility], [address], [place], [place] [address]    Culture  Setup Time      GRAM NEGATIVE RODS AEROBIC BOTTLE ONLY CRITICAL RESULT CALLED TO, READ BACK BY AND VERIFIED WITH: PHARMD M SWAYNE [id number] AT 927 AM BY CM Performed at [facility], [address], [place], [place] [address]    Culture GRAM NEGATIVE RODS    Report Status PENDING   Lipase, blood     Status: Abnormal   Collection Time: 04/07/20  3:52 PM  Result Value Ref Range   Lipase 61 (H) 11 - 51 U/L    Comment: Performed at [facility], [address] [place], [place]  [address]  Lactic acid, plasma     Status: None   Collection Time: 04/07/20  3:52 PM  Result Value Ref Range   Lactic Acid, Venous 1.5 0.5 - 1.9 mmol/L    Comment: Performed at [facility], [address] [place], [place] [address]  Troponin I (High Sensitivity)     Status: None   Collection Time: 04/07/20  3:52 PM  Result Value Ref Range   Troponin I (High Sensitivity) 7 <18 ng/L    Comment: (NOTE) Elevated high sensitivity troponin I (hsTnI) values and significant  changes across serial measurements may suggest ACS but many other  chronic and acute conditions are known to elevate hsTnI results.  Refer to the "Links" section for chest pain algorithms and additional  guidance. Performed at [facility], [address], [place], [place] [address]   Reticulocytes     Status: Abnormal   Collection Time: 04/07/20  3:52 PM  Result Value Ref Range   Retic Ct Pct 6.3 (H) 0.4 - 3.1 %   RBC. 1.17 (L) 4.22 - 5.81 MIL/uL   Retic Count, Absolute 73.7 19.0 - 186.0 K/uL   Immature Retic Fract 13.0 2.3 - 15.9 %    Comment: Performed at [facility], [address], [place], [place] [address]  Blood Culture ID Panel (Reflexed)     Status: Abnormal   Collection Time: 04/07/20  3:52 PM  Result Value Ref Range   Enterococcus species NOT DETECTED NOT DETECTED   Listeria monocytogenes NOT DETECTED NOT DETECTED   Staphylococcus species NOT DETECTED NOT DETECTED   Staphylococcus aureus (BCID) NOT DETECTED NOT DETECTED   Streptococcus species NOT DETECTED NOT DETECTED   Streptococcus agalactiae NOT DETECTED NOT DETECTED  Streptococcus pneumoniae NOT DETECTED NOT DETECTED   Streptococcus pyogenes NOT DETECTED NOT DETECTED   Acinetobacter baumannii NOT DETECTED NOT DETECTED   Enterobacteriaceae species DETECTED (A) NOT DETECTED    Comment: Enterobacteriaceae represent a large family of gram-negative bacteria, not a single organism. CRITICAL  RESULT CALLED TO, READ BACK BY AND VERIFIED WITH: PHARMD M SWAYNE [id number] AT 927 AM BY CM    Enterobacter cloacae complex NOT DETECTED NOT DETECTED   Escherichia coli DETECTED (A) NOT DETECTED    Comment: CRITICAL RESULT CALLED TO, READ BACK BY AND VERIFIED WITH: PHARMD M SWAYNE [id number] AT 927 AM BY CM    Klebsiella oxytoca NOT DETECTED NOT DETECTED   Klebsiella pneumoniae NOT DETECTED NOT DETECTED   Proteus species NOT DETECTED NOT DETECTED   Serratia marcescens NOT DETECTED NOT DETECTED   Carbapenem resistance NOT DETECTED NOT DETECTED   Haemophilus influenzae NOT DETECTED NOT DETECTED   Neisseria meningitidis NOT DETECTED NOT DETECTED   Pseudomonas aeruginosa NOT DETECTED NOT DETECTED   Candida albicans NOT DETECTED NOT DETECTED   Candida glabrata NOT DETECTED NOT DETECTED   Candida krusei NOT DETECTED NOT DETECTED   Candida parapsilosis NOT DETECTED NOT DETECTED   Candida tropicalis NOT DETECTED NOT DETECTED    Comment: Performed at [facility], [address], [place], [place] [address]  Type and screen [facility]     Status: None (Preliminary result)   Collection Time: 04/07/20  4:22 PM  Result Value Ref Range   ABO/RH(D) B POS    Antibody Screen NEG    Sample Expiration 04/10/2020,2359    Unit Number [id number]    Blood Component Type RED CELLS,LR    Unit division 00    Status of Unit ISSUED,FINAL    Donor AG Type NEGATIVE FOR C ANTIGEN NEGATIVE FOR KELL ANTIGEN    Transfusion Status OK TO TRANSFUSE    Crossmatch Result COMPATIBLE    Unit Number [id number]    Blood Component Type RED CELLS,LR    Unit division 00    Status of Unit ISSUED    Donor AG Type NEGATIVE FOR C ANTIGEN NEGATIVE FOR KELL ANTIGEN    Transfusion Status OK TO TRANSFUSE    Crossmatch Result COMPATIBLE    Unit Number [id number]    Blood Component Type RED CELLS,LR    Unit division 00    Status of Unit ISSUED    Donor AG Type NEGATIVE FOR C ANTIGEN NEGATIVE FOR  KELL ANTIGEN    Transfusion Status OK TO TRANSFUSE    Crossmatch Result COMPATIBLE   Prepare RBC (crossmatch)     Status: None   Collection Time: 04/07/20  4:22 PM  Result Value Ref Range   Order Confirmation      ORDER PROCESSED BY BLOOD BANK Performed at [facility], [address], [place], [place] [address]   SARS Coronavirus 2 by RT PCR (hospital order, performed in [facility] hospital lab) Nasopharyngeal Nasopharyngeal Swab     Status: None   Collection Time: 04/07/20  4:22 PM   Specimen: Nasopharyngeal Swab  Result Value Ref Range   SARS Coronavirus 2 NEGATIVE NEGATIVE    Comment: (NOTE) SARS-CoV-2 target nucleic acids are NOT DETECTED.  The SARS-CoV-2 RNA is generally detectable in upper and lower respiratory specimens during the acute phase of infection. The lowest concentration of SARS-CoV-2 viral copies this assay can detect is 250 copies / mL. A negative result does not preclude SARS-CoV-2 infection and should not  be used as the sole basis for treatment or other patient management decisions.  A negative result may occur with improper specimen collection / handling, submission of specimen other than nasopharyngeal swab, presence of viral mutation(s) within the areas targeted by this assay, and inadequate number of viral copies (<250 copies / mL). A negative result must be combined with clinical observations, patient history, and epidemiological information.  Fact Sheet for Patients:   BoilerBrush.com.cy  Fact Sheet for Healthcare Providers: https://pope.com/  This test is not yet approved or  cleared by the [place] FDA and has been authorized for detection and/or diagnosis of SARS-CoV-2 by FDA under an Emergency Use Authorization (EUA).  This EUA will remain in effect (meaning this test can be used) for the duration of the COVID-19 declaration under Section 564(b)(1) of the Act, 21  U.S.C. section 360bbb-3(b)(1), unless the authorization is terminated or revoked sooner.  Performed at [facility], [address] [place], [place] [address]   POC occult blood, ED Provider will collect     Status: None   Collection Time: 04/07/20  5:21 PM  Result Value Ref Range   Fecal Occult Bld NEGATIVE NEGATIVE  Urinalysis, Routine w reflex microscopic     Status: Abnormal   Collection Time: 04/07/20  6:12 PM  Result Value Ref Range   Color, Urine AMBER (A) YELLOW    Comment: BIOCHEMICALS MAY BE AFFECTED BY COLOR   APPearance TURBID (A) CLEAR   Specific Gravity, Urine 1.009 1.005 - 1.030   pH 6.0 5.0 - 8.0   Glucose, UA NEGATIVE NEGATIVE mg/dL   Hgb urine dipstick MODERATE (A) NEGATIVE   Bilirubin Urine NEGATIVE NEGATIVE   Ketones, ur NEGATIVE NEGATIVE mg/dL   Protein, ur >=098 (A) NEGATIVE mg/dL   Nitrite NEGATIVE NEGATIVE   Leukocytes,Ua LARGE (A) NEGATIVE   RBC / HPF 21-50 0 - 5 RBC/hpf   WBC, UA >50 (H) 0 - 5 WBC/hpf   Bacteria, UA MANY (A) NONE SEEN   WBC Clumps PRESENT    Mucus PRESENT     Comment: Performed at [facility], [address] [place], [place] [address]  Lactic acid, plasma     Status: Abnormal   Collection Time: 04/07/20  6:41 PM  Result Value Ref Range   Lactic Acid, Venous 2.8 (HH) 0.5 - 1.9 mmol/L    Comment: CRITICAL RESULT CALLED TO, READ BACK BY AND VERIFIED WITH: J.SMITH AT 1929 ON 04/07/20 BY N.THOMPSON Performed at [facility], [address], [place], [place] [address]   Troponin I (High Sensitivity)     Status: None   Collection Time: 04/07/20  6:41 PM  Result Value Ref Range   Troponin I (High Sensitivity) 7 <18 ng/L    Comment: (NOTE) Elevated high sensitivity troponin I (hsTnI) values and significant  changes across serial measurements may suggest ACS but many other  chronic and acute conditions are known to elevate hsTnI results.  Refer to the "Links" section for chest  pain algorithms and additional  guidance. Performed at [facility], [address] [place], [place] [address]   HIV Antibody (routine testing w rflx)     Status: None   Collection Time: 04/07/20  6:41 PM  Result Value Ref Range   HIV Screen 4th Generation wRfx Non Reactive Non Reactive    Comment: Performed at [facility], [address] [place], [place] [address]  CBC     Status: Abnormal  Collection Time: 04/07/20  6:41 PM  Result Value Ref Range   WBC 28.7 (H) 4.0 - 10.5 K/uL   RBC 1.10 (L) 4.22 - 5.81 MIL/uL   Hemoglobin 3.9 (LL) 13.0 - 17.0 g/dL    Comment: CRITICAL VALUE NOTED.  VALUE IS CONSISTENT WITH PREVIOUSLY REPORTED AND CALLED VALUE. REPEATED TO VERIFY    HCT 10.2 (L) 39 - 52 %   MCV 92.7 80.0 - 100.0 fL   MCH 35.5 (H) 26.0 - 34.0 pg   MCHC RESULTS UNAVAILABLE DUE TO INTERFERING SUBSTANCE 30.0 - 36.0 g/dL    Comment: SICKLE CELLS   RDW 20.1 (H) 11.5 - 15.5 %   Platelets 235 150 - 400 K/uL   nRBC 0.4 (H) 0.0 - 0.2 %    Comment: Performed at [facility], [address] [place], [place] [address]  Creatinine, serum     Status: Abnormal   Collection Time: 04/07/20  6:41 PM  Result Value Ref Range   Creatinine, Ser 7.09 (H) 0.61 - 1.24 mg/dL   GFR calc non Af Amer 9 (L) >60 mL/min   GFR calc Af Amer 10 (L) >60 mL/min    Comment: Performed at [facility], [address] [place], [place] [address]  Procalcitonin - Baseline     Status: None   Collection Time: 04/07/20  7:49 PM  Result Value Ref Range   Procalcitonin 91.83 ng/mL    Comment:        Interpretation: PCT >= 10 ng/mL: Important systemic inflammatory response, almost exclusively due to severe bacterial sepsis or septic shock. (NOTE)       Sepsis PCT Algorithm           Lower Respiratory Tract                                      Infection PCT Algorithm    ----------------------------     ----------------------------         PCT <  0.25 ng/mL                PCT < 0.10 ng/mL          Strongly encourage             Strongly discourage   discontinuation of antibiotics    initiation of antibiotics    ----------------------------     -----------------------------       PCT 0.25 - 0.50 ng/mL            PCT 0.10 - 0.25 ng/mL               OR       >80% decrease in PCT            Discourage initiation of                                            antibiotics      Encourage discontinuation           of antibiotics    ----------------------------     -----------------------------         PCT >= 0.50 ng/mL              PCT 0.26 - 0.50 ng/mL  AND       <80% decrease in PCT             Encourage initiation of                                             antibiotics       Encourage continuation           of antibiotics    ----------------------------     -----------------------------        PCT >= 0.50 ng/mL                  PCT > 0.50 ng/mL               AND         increase in PCT                  Strongly encourage                                      initiation of antibiotics    Strongly encourage escalation           of antibiotics                                     -----------------------------                                           PCT <= 0.25 ng/mL                                                 OR                                        > 80% decrease in PCT                                      Discontinue / Do not initiate                                             antibiotics  Performed at [facility], [address] [place], [place] [address]   Procalcitonin     Status: None   Collection Time: 04/08/20  6:15 AM  Result Value Ref Range   Procalcitonin 59.22 ng/mL    Comment:        Interpretation: PCT >= 10 ng/mL: Important systemic inflammatory response, almost exclusively due to severe bacterial sepsis or septic shock. (NOTE)       Sepsis PCT Algorithm            Lower Respiratory Tract  Infection PCT Algorithm    ----------------------------     ----------------------------         PCT < 0.25 ng/mL                PCT < 0.10 ng/mL          Strongly encourage             Strongly discourage   discontinuation of antibiotics    initiation of antibiotics    ----------------------------     -----------------------------       PCT 0.25 - 0.50 ng/mL            PCT 0.10 - 0.25 ng/mL               OR       >80% decrease in PCT            Discourage initiation of                                            antibiotics      Encourage discontinuation           of antibiotics    ----------------------------     -----------------------------         PCT >= 0.50 ng/mL              PCT 0.26 - 0.50 ng/mL                AND       <80% decrease in PCT             Encourage initiation of                                             antibiotics       Encourage continuation           of antibiotics    ----------------------------     -----------------------------        PCT >= 0.50 ng/mL                  PCT > 0.50 ng/mL               AND         increase in PCT                  Strongly encourage                                      initiation of antibiotics    Strongly encourage escalation           of antibiotics                                     -----------------------------                                           PCT <= 0.25 ng/mL  OR                                        > 80% decrease in PCT                                      Discontinue / Do not initiate                                             antibiotics  Performed at [facility], [address] [place], [place] [address]   CBC     Status: Abnormal   Collection Time: 04/08/20  6:15 AM  Result Value Ref Range   WBC 23.8 (H) 4.0 - 10.5 K/uL   RBC 1.63 (L) 4.22 - 5.81 MIL/uL    Hemoglobin 5.4 (LL) 13.0 - 17.0 g/dL    Comment: CRITICAL VALUE NOTED.  VALUE IS CONSISTENT WITH PREVIOUSLY REPORTED AND CALLED VALUE. REPEATED TO VERIFY POST TRANSFUSION SPECIMEN    HCT 14.7 (L) 39 - 52 %   MCV 90.2 80.0 - 100.0 fL   MCH 33.1 26.0 - 34.0 pg   MCHC 36.7 (H) 30.0 - 36.0 g/dL   RDW 60.4 (H) 54.0 - 98.1 %   Platelets 217 150 - 400 K/uL   nRBC 0.5 (H) 0.0 - 0.2 %    Comment: Performed at [facility], [address] [place], [place] [address]  Comprehensive metabolic panel     Status: Abnormal   Collection Time: 04/08/20  6:15 AM  Result Value Ref Range   Sodium 128 (L) 135 - 145 mmol/L   Potassium 4.1 3.5 - 5.1 mmol/L   Chloride 105 98 - 111 mmol/L   CO2 10 (L) 22 - 32 mmol/L   Glucose, Bld 104 (H) 70 - 99 mg/dL    Comment: Glucose reference range applies only to samples taken after fasting for at least 8 hours.   BUN 114 (H) 6 - 20 mg/dL    Comment: RESULTS CONFIRMED BY MANUAL DILUTION   Creatinine, Ser 7.07 (H) 0.61 - 1.24 mg/dL   Calcium 8.4 (L) 8.9 - 10.3 mg/dL   Total Protein 6.6 6.5 - 8.1 g/dL   Albumin 3.0 (L) 3.5 - 5.0 g/dL   AST 36 15 - 41 U/L   ALT 24 0 - 44 U/L   Alkaline Phosphatase 88 38 - 126 U/L   Total Bilirubin 3.0 (H) 0.3 - 1.2 mg/dL   GFR calc non Af Amer 9 (L) >60 mL/min   GFR calc Af Amer 10 (L) >60 mL/min   Anion gap 13 5 - 15    Comment: Performed at [facility], [address] [place], [place] [address]  Prepare RBC (crossmatch)     Status: None   Collection Time: 04/08/20  7:26 AM  Result Value Ref Range   Order Confirmation      ORDER PROCESSED BY BLOOD BANK Performed at [facility], [address] [place], [place] [address]   Basic metabolic panel     Status: Abnormal   Collection Time: 04/08/20  2:56 PM  Result Value Ref Range   Sodium 131 (L) 135 -  145 mmol/L   Potassium 4.4 3.5 - 5.1 mmol/L   Chloride 104 98 - 111 mmol/L   CO2 12 (L) 22 - 32 mmol/L   Glucose, Bld  98 70 - 99 mg/dL    Comment: Glucose reference range applies only to samples taken after fasting for at least 8 hours.   BUN 113 (H) 6 - 20 mg/dL    Comment: RESULTS CONFIRMED BY MANUAL DILUTION   Creatinine, Ser 7.43 (H) 0.61 - 1.24 mg/dL   Calcium 8.7 (L) 8.9 - 10.3 mg/dL   GFR calc non Af Amer 8 (L) >60 mL/min   GFR calc Af Amer 9 (L) >60 mL/min   Anion gap 15 5 - 15    Comment: Performed at [facility], [address] [place], [place] [address]  CBC     Status: Abnormal   Collection Time: 04/08/20  2:56 PM  Result Value Ref Range   WBC 21.2 (H) 4.0 - 10.5 K/uL   RBC 1.81 (L) 4.22 - 5.81 MIL/uL   Hemoglobin 5.9 (LL) 13.0 - 17.0 g/dL    Comment: CRITICAL VALUE NOTED.  VALUE IS CONSISTENT WITH PREVIOUSLY REPORTED AND CALLED VALUE. THIS CRITICAL RESULT HAS VERIFIED AND BEEN CALLED TO [facility] BY MARINDA BLACK ON 07 14 2021 AT 1531, AND HAS BEEN READ BACK. [facility]    HCT 16.2 (L) 39 - 52 %   MCV 89.5 80.0 - 100.0 fL   MCH 32.6 26.0 - 34.0 pg   MCHC 36.4 (H) 30.0 - 36.0 g/dL   RDW 08.6 (H) 57.8 - 46.9 %   Platelets 217 150 - 400 K/uL   nRBC 0.7 (H) 0.0 - 0.2 %    Comment: Performed at [facility], [address] [place], [place] [address]    MICRO: reviewed IMAGING: CT Abdomen Pelvis Wo Contrast  Result Date: 04/07/2020 CLINICAL DATA:  Abdominal pain and fever. History of sickle cell disease. EXAM: CT ABDOMEN AND PELVIS WITHOUT CONTRAST TECHNIQUE: Multidetector CT imaging of the abdomen and pelvis was performed following the standard protocol without IV contrast. COMPARISON:  None. FINDINGS: Lower chest: Scarring is identified within the posterior right lower lobe. No pleural effusion. Heart size is enlarged. Hepatobiliary: Hepatomegaly. Within the limitations of unenhanced technique there is no focal liver abnormality. Small stones are noted within the gallbladder measuring up to 4 mm. Pancreas: Unremarkable. No pancreatic ductal dilatation or  surrounding inflammatory changes. Spleen: Auto infarcted calcified spleen. Adrenals/Urinary Tract: Normal appearance of the adrenal glands. No kidney mass or hydronephrosis. The urinary bladder is unremarkable. Stomach/Bowel: Stomach appears normal. No bowel wall thickening, inflammation, or distension. Retro cecal appendix noted. No findings of acute appendicitis. Vascular/Lymphatic: Normal appearance of the abdominal aorta. No abdominopelvic adenopathy identified. No inguinal adenopathy. Reproductive: Prostate is unremarkable. Other: No free fluid or fluid collections. Musculoskeletal: Bony changes of sickle cell disease noted. No acute or suspicious osseous findings. IMPRESSION: 1. No acute findings identified within the abdomen or pelvis. 2. Gallstones. 3. Hepatomegaly. 4. Bony changes of sickle cell disease. Electronically Signed   By: Signa Kell M.D.   On: 04/07/2020 16:09   DG Chest Port 1 View  Result Date: 04/07/2020 CLINICAL DATA:  Chest pain EXAM: PORTABLE CHEST 1 VIEW COMPARISON:  09/26/2019 FINDINGS: Heart is borderline in size. Bulky mediastinal and hilar adenopathy, likely related to known sarcoidosis. This is stable since prior study. No confluent opacities or effusions. No acute bony abnormality. IMPRESSION: Borderline heart size. Mediastinal and hilar adenopathy compatible  with sarcoidosis. No acute cardiopulmonary disease. Electronically Signed   By: Charlett Nose M.D.   On: 04/07/2020 16:25   US Abdomen Limited RUQ  Result Date: 04/07/2020 CLINICAL DATA:  Cholelithiasis EXAM: ULTRASOUND ABDOMEN LIMITED RIGHT UPPER QUADRANT COMPARISON:  Noncontrast CT abdomen pelvis 04/07/2020 FINDINGS: Gallbladder: At least 1 mobile gallstone is seen within the gallbladder fundus measuring 7 mm in diameter. The gallbladder, however, is not distended, there is no gallbladder wall thickening, and no pericholecystic fluid is identified. The sonographic Eulah Pont sign is reportedly negative. Common bile duct:  Diameter: 6 mm in proximal diameter Liver: The left hepatic lobe is relatively small and there is enlargement of the right hepatic lobe in keeping with a Riedel's lobe variant. Parenchymal echogenicity and echotexture is normal. The liver contour is smooth. There is a circumscribed hypoechoic homogeneous solid lesion within the right hepatic lobe measuring 10 mm x 12 mm x 13 mm, best seen on image # [id number] which is indeterminate, but may represent a atypical hemangioma or post infectious or post inflammatory lesion. This is, however, indeterminate on this examination. Portal vein is patent on color Doppler imaging with normal direction of blood flow towards the liver. Other: Incidentally noted is diffusely increased renal cortical echogenicity of the right kidney suggesting changes of underlying medical renal disease. This appears slightly progressive since prior examination of 04/28/2016. Renal cortical thickness, however, is preserved. IMPRESSION: Cholelithiasis without sonographic evidence of acute cholecystitis. 13 mm hypoechoic lesion within the right hepatic lobe, indeterminate. This could be better assessed with contrast enhanced MRI examination if clinically indicated. Alternatively, a follow-up sonogram in 6 months may be helpful to document stability. Electronically Signed   By: Helyn Numbers MD   On: 04/07/2020 16:57   Assessment/Plan:  44yo M admitted for sepsis with sickle cell crisis found to have ecoli bacteremia as possible trigger. Has had some urinary symptoms - as possible bladder/urinary source for infection. Imaging does not suggest colitis, intra-abdominal infection  - narrow abtx to ceftriaxone 2gm iv daily - will await urine cx to see if found at other sources - continue to follow leukocytosis to see that it is continued to trending down - if improves quickly to iv abtx- can consider to change to orals and, will need to treat for 7-10d depending on response to treatment  Sickle cell  crisis = defer to primary team for management. hgb appears responded since receiving 2 u rbc transfusion  Mandel Seiden B. Drue Second MD MPH [facility] [phone]

## 2020-04-08 NOTE — TOC Initial Note (Signed)
Transition of Care [facility]) - Initial/Assessment Note    Patient Details  Name: Shawn Adams MRN: [id number] Date of Birth: 02/09/1976  [facility] CM/SW Contact:    Lanier Clam, RN Phone Number: 04/08/2020, 1:26 PM  Clinical Narrative: Patient states d/c plan home. Has pcp,pharmacy,transport home. CM referral for home RN-patient has no need for home RN. No further CM needs.                  Expected Discharge Plan: Home/Self Care Barriers to Discharge: Continued Medical Work up   Patient Goals and CMS Choice Patient states their goals for this hospitalization and ongoing recovery are:: go home CMS Medicare.gov Compare Post Acute Care list provided to:: Patient Choice offered to / list presented to : NA  Expected Discharge Plan and Services Expected Discharge Plan: Home/Self Care   Discharge Planning Services: CM Consult   Living arrangements for the past 2 months: Single Family Home                                      Prior Living Arrangements/Services Living arrangements for the past 2 months: Single Family Home Lives with:: Significant Other   Do you feel safe going back to the place where you live?: Yes               Activities of Daily Living Home Assistive Devices/Equipment: Dentures (specify type) (partials, bottom (at home)) ADL Screening (condition at time of admission) Patient's cognitive ability adequate to safely complete daily activities?: No Is the patient deaf or have difficulty hearing?: No Does the patient have difficulty seeing, even when wearing glasses/contacts?: No Does the patient have difficulty concentrating, remembering, or making decisions?: Yes Patient able to express need for assistance with ADLs?: Yes Does the patient have difficulty dressing or bathing?: No Independently performs ADLs?: Yes (appropriate for developmental age) Does the patient have difficulty walking or climbing stairs?: No Weakness of Legs:  None Weakness of Arms/Hands: None  Permission Sought/Granted Permission sought to share information with : Case Manager Permission granted to share information with : Yes, Verbal Permission Granted              Emotional Assessment              Admission diagnosis:  Sickle cell anemia with pain (HCC) [D57.00] Abdominal pain [R10.9] Anemia, unspecified type [D64.9] Chest pain, unspecified type [R07.9] Sepsis with acute renal failure without septic shock, due to unspecified organism, unspecified acute renal failure type (HCC) [A41.9, R65.20, N17.9] Patient Active Problem List   Diagnosis Date Noted  . Sickle cell anemia with pain (HCC) 04/07/2020  . Chronic pain syndrome   . Sarcoidosis 07/06/2018  . Persistent asthma without complication 07/06/2018  . Headache 05/03/2016  . Anemia of chronic disease 05/03/2016  . Headache disorder   . Vitamin D deficiency 02/09/2016  . Heart murmur 02/09/2016  . Recurrent occipital headache 10/15/2015  . History of aneurysm 10/13/2015  . Left hip pain 07/30/2015  . Left ankle pain 07/08/2015  . Sickle cell crisis (HCC) 03/08/2015  . 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 Sickle cell anemia with crisis (HCC) 12/07/2014  . Sickle cell anemia (HCC) 06/20/2014  . CKD (chronic kidney disease) stage 2, GFR 60-89 ml/min 06/20/2014  . Leukocytosis 06/20/2014  . Sickle cell pain crisis (HCC) 06/20/2014  . Hypercalcemia 06/20/2014  . Symptomatic anemia 05/31/2014  .  AKI (acute kidney injury) (HCC) 05/31/2014   PCP:  Mike Gip, FNP Pharmacy:   [id number] [place], [place] - [phone] AT [facility] & [address] [place] [address] Phone: [phone] Fax: [phone]     Social Determinants of Health (SDOH) Interventions    Readmission Risk Interventions No flowsheet data found.

## 2020-04-08 NOTE — Plan of Care (Signed)
  Problem: Activity: Goal: Ability to return to normal activity level will improve to the fullest extent possible by discharge Outcome: Progressing   Problem: Education: Goal: Knowledge of medication regimen will be met for pain relief regimen by discharge Outcome: Progressing Goal: Understanding of ways to prevent infection will improve by discharge Outcome: Progressing   Problem: Coping: Goal: Ability to verbalize feelings will improve by discharge Outcome: Progressing Goal: Family members realistic understanding of the patients condition will improve by discharge Outcome: Progressing   Problem: Fluid Volume: Goal: Maintenance of adequate hydration will improve by discharge Outcome: Progressing   Problem: Medication: Goal: Compliance with prescribed medication regimen will improve by discharge Outcome: Progressing   Problem: Physical Regulation: Goal: Hemodynamic stability will return to baseline for the patient by discharge Outcome: Progressing Goal: Diagnostic test results will improve Outcome: Progressing Goal: Will remain free from infection Outcome: Progressing   Problem: Respiratory: Goal: Ability to maintain adequate oxygenation and ventilation will improve by discharge Outcome: Progressing   Problem: Role Relationship: Goal: Ability to identify and utilize available support systems will improve by discharge Outcome: Progressing   Problem: Pain Management: Goal: Satisfaction with pain management regimen will be met by discharge Outcome: Progressing

## 2020-04-08 NOTE — Progress Notes (Addendum)
Subjective: Shawn Adams is a 44 year old male with a medical history significant for sickle cell disease, history of symptomatic anemia, history of sarcoidosis, and stage III chronic kidney disease was admitted for symptomatic anemia in the setting of sickle cell pain crisis.  On admission, patient's hemoglobin was 3.9.  He is status post 2 units PRBCs.  Today, hemoglobin has increased to 5.4.  Patient endorses some shortness of breath with exertion.  He also says that he feels weak.  He denies dizziness, chest pain, paresthesias, urinary frequency, urgency, retention, nausea, vomiting, or diarrhea.  Also, blood culture positive for growth of E. coli.  Patient continues to be afebrile and oxygen saturation is 99% on 2 L.  Patient does not have any implanted devices.  COVID-19 negative.  Urinalysis shows large leukocytes and many bacteria.  Objective:  Vital signs in last 24 hours:  Vitals:   04/08/20 0758 04/08/20 0833 04/08/20 0943 04/08/20 1010  BP:  114/66 110/63 118/61  Pulse:  98 92 88  Resp:  20    Temp:  99.4 F (37.4 C) 98.2 F (36.8 C) 98.4 F (36.9 C)  TempSrc:  Oral Oral Oral  SpO2: 94% 97% 99% 98%  Weight:      Height:        Intake/Output from previous day:   Intake/Output Summary (Last 24 hours) at 04/08/2020 1157 Last data filed at 04/08/2020 0518 Gross per 24 hour  Intake 999.09 ml  Output 1100 ml  Net -100.91 ml   Physical Exam Constitutional:      Appearance: He is well-developed.  HENT:     Head: Normocephalic.     Mouth/Throat:     Mouth: Mucous membranes are dry.  Eyes:     General: Lids are normal. Scleral icterus present.  Cardiovascular:     Rate and Rhythm: Normal rate and regular rhythm.     Heart sounds: Normal heart sounds.  Pulmonary:     Effort: Pulmonary effort is normal.  Abdominal:     General: Bowel sounds are normal.     Palpations: Abdomen is soft.     Tenderness: There is no abdominal tenderness. There is no right CVA tenderness  or left CVA tenderness.  Skin:    General: Skin is dry.  Neurological:     General: No focal deficit present.     Mental Status: He is alert and oriented to person, place, and time.  Psychiatric:        Mood and Affect: Mood normal.        Behavior: Behavior normal.     Lab Results:  Basic Metabolic Panel:    Component Value Date/Time   NA 128 (L) 04/08/2020 0615   NA 138 05/04/2018 1352   K 4.1 04/08/2020 0615   CL 105 04/08/2020 0615   CO2 10 (L) 04/08/2020 0615   BUN 114 (H) 04/08/2020 0615   BUN 15 05/04/2018 1352   CREATININE 7.07 (H) 04/08/2020 0615   CREATININE 1.39 (H) 05/11/2016 1100   GLUCOSE 104 (H) 04/08/2020 0615   CALCIUM 8.4 (L) 04/08/2020 0615   CBC:    Component Value Date/Time   WBC 23.8 (H) 04/08/2020 0615   HGB 5.4 (LL) 04/08/2020 0615   HGB 6.2 (LL) 05/08/2018 1107   HCT 14.7 (L) 04/08/2020 0615   HCT 17.0 (LL) 05/08/2018 1107   PLT 217 04/08/2020 0615   PLT 431 05/08/2018 1107   MCV 90.2 04/08/2020 0615   MCV 98 (H) 05/08/2018 1107  NEUTROABS 5.8 04/12/2019 0416   NEUTROABS 3.4 05/08/2018 1107   LYMPHSABS 0.5 (L) 04/12/2019 0416   LYMPHSABS 3.1 05/08/2018 1107   MONOABS 0.1 04/12/2019 0416   EOSABS 0.0 04/12/2019 0416   EOSABS 0.1 05/08/2018 1107   BASOSABS 0.0 04/12/2019 0416   BASOSABS 0.3 (H) 05/08/2018 1107    Recent Results (from the past 240 hour(s))  Culture, blood (routine x 2)     Status: None (Preliminary result)   Collection Time: 04/07/20  3:52 PM   Specimen: BLOOD  Result Value Ref Range Status   Specimen Description   Final    BLOOD RIGHT ANTECUBITAL Performed at [facility], [address], [place], [place] [address]    Special Requests   Final    BOTTLES DRAWN AEROBIC AND ANAEROBIC Blood Culture adequate volume Performed at [facility], [address], [place], [place] [address]    Culture  Setup Time   Final    GRAM NEGATIVE RODS AEROBIC BOTTLE ONLY CRITICAL RESULT  CALLED TO, READ BACK BY AND VERIFIED WITH: PHARMD M SWAYNE [id number] AT 927 AM BY CM Performed at [facility], [address], [place], [place] [address]    Culture GRAM NEGATIVE RODS  Final   Report Status PENDING  Incomplete  Blood Culture ID Panel (Reflexed)     Status: Abnormal   Collection Time: 04/07/20  3:52 PM  Result Value Ref Range Status   Enterococcus species NOT DETECTED NOT DETECTED Final   Listeria monocytogenes NOT DETECTED NOT DETECTED Final   Staphylococcus species NOT DETECTED NOT DETECTED Final   Staphylococcus aureus (BCID) NOT DETECTED NOT DETECTED Final   Streptococcus species NOT DETECTED NOT DETECTED Final   Streptococcus agalactiae NOT DETECTED NOT DETECTED Final   Streptococcus pneumoniae NOT DETECTED NOT DETECTED Final   Streptococcus pyogenes NOT DETECTED NOT DETECTED Final   Acinetobacter baumannii NOT DETECTED NOT DETECTED Final   Enterobacteriaceae species DETECTED (A) NOT DETECTED Final    Comment: Enterobacteriaceae represent a large family of gram-negative bacteria, not a single organism. CRITICAL RESULT CALLED TO, READ BACK BY AND VERIFIED WITH: PHARMD M SWAYNE [id number] AT 927 AM BY CM    Enterobacter cloacae complex NOT DETECTED NOT DETECTED Final   Escherichia coli DETECTED (A) NOT DETECTED Final    Comment: CRITICAL RESULT CALLED TO, READ BACK BY AND VERIFIED WITH: PHARMD M SWAYNE [id number] AT 927 AM BY CM    Klebsiella oxytoca NOT DETECTED NOT DETECTED Final   Klebsiella pneumoniae NOT DETECTED NOT DETECTED Final   Proteus species NOT DETECTED NOT DETECTED Final   Serratia marcescens NOT DETECTED NOT DETECTED Final   Carbapenem resistance NOT DETECTED NOT DETECTED Final   Haemophilus influenzae NOT DETECTED NOT DETECTED Final   Neisseria meningitidis NOT DETECTED NOT DETECTED Final   Pseudomonas aeruginosa NOT DETECTED NOT DETECTED Final   Candida albicans NOT DETECTED NOT DETECTED Final   Candida glabrata NOT DETECTED NOT DETECTED Final    Candida krusei NOT DETECTED NOT DETECTED Final   Candida parapsilosis NOT DETECTED NOT DETECTED Final   Candida tropicalis NOT DETECTED NOT DETECTED Final    Comment: Performed at [facility], [address] [place], [place] [address]  SARS Coronavirus 2 by RT PCR (hospital order, performed in [facility] hospital lab) Nasopharyngeal Nasopharyngeal Swab     Status: None   Collection Time: 04/07/20  4:22 PM   Specimen: Nasopharyngeal Swab  Result Value Ref Range Status   SARS  Coronavirus 2 NEGATIVE NEGATIVE Final    Comment: (NOTE) SARS-CoV-2 target nucleic acids are NOT DETECTED.  The SARS-CoV-2 RNA is generally detectable in upper and lower respiratory specimens during the acute phase of infection. The lowest concentration of SARS-CoV-2 viral copies this assay can detect is 250 copies / mL. A negative result does not preclude SARS-CoV-2 infection and should not be used as the sole basis for treatment or other patient management decisions.  A negative result may occur with improper specimen collection / handling, submission of specimen other than nasopharyngeal swab, presence of viral mutation(s) within the areas targeted by this assay, and inadequate number of viral copies (<250 copies / mL). A negative result must be combined with clinical observations, patient history, and epidemiological information.  Fact Sheet for Patients:   BoilerBrush.com.cyhttps://www.fda.gov/media/136312/download  Fact Sheet for Healthcare Providers: https://pope.com/https://www.fda.gov/media/136313/download  This test is not yet approved or  cleared by the [place] FDA and has been authorized for detection and/or diagnosis of SARS-CoV-2 by FDA under an Emergency Use Authorization (EUA).  This EUA will remain in effect (meaning this test can be used) for the duration of the COVID-19 declaration under Section 564(b)(1) of the Act, 21 U.S.C. section 360bbb-3(b)(1), unless the authorization is terminated or revoked  sooner.  Performed at [facility], [address] [place], [place] [id number]     Studies/Results: CT Abdomen Pelvis Wo Contrast  Result Date: 04/07/2020 CLINICAL DATA:  Abdominal pain and fever. History of sickle cell disease. EXAM: CT ABDOMEN AND PELVIS WITHOUT CONTRAST TECHNIQUE: Multidetector CT imaging of the abdomen and pelvis was performed following the standard protocol without IV contrast. COMPARISON:  None. FINDINGS: Lower chest: Scarring is identified within the posterior right lower lobe. No pleural effusion. Heart size is enlarged. Hepatobiliary: Hepatomegaly. Within the limitations of unenhanced technique there is no focal liver abnormality. Small stones are noted within the gallbladder measuring up to 4 mm. Pancreas: Unremarkable. No pancreatic ductal dilatation or surrounding inflammatory changes. Spleen: Auto infarcted calcified spleen. Adrenals/Urinary Tract: Normal appearance of the adrenal glands. No kidney mass or hydronephrosis. The urinary bladder is unremarkable. Stomach/Bowel: Stomach appears normal. No bowel wall thickening, inflammation, or distension. Retro cecal appendix noted. No findings of acute appendicitis. Vascular/Lymphatic: Normal appearance of the abdominal aorta. No abdominopelvic adenopathy identified. No inguinal adenopathy. Reproductive: Prostate is unremarkable. Other: No free fluid or fluid collections. Musculoskeletal: Bony changes of sickle cell disease noted. No acute or suspicious osseous findings. IMPRESSION: 1. No acute findings identified within the abdomen or pelvis. 2. Gallstones. 3. Hepatomegaly. 4. Bony changes of sickle cell disease. Electronically Signed   By: Signa Kellaylor  Stroud M.D.   On: 04/07/2020 16:09   DG Chest Port 1 View  Result Date: 04/07/2020 CLINICAL DATA:  Chest pain EXAM: PORTABLE CHEST 1 VIEW COMPARISON:  09/26/2019 FINDINGS: Heart is borderline in size. Bulky mediastinal and hilar adenopathy, likely related to  known sarcoidosis. This is stable since prior study. No confluent opacities or effusions. No acute bony abnormality. IMPRESSION: Borderline heart size. Mediastinal and hilar adenopathy compatible with sarcoidosis. No acute cardiopulmonary disease. Electronically Signed   By: Charlett NoseKevin  Dover M.D.   On: 04/07/2020 16:25   US Abdomen Limited RUQ  Result Date: 04/07/2020 CLINICAL DATA:  Cholelithiasis EXAM: ULTRASOUND ABDOMEN LIMITED RIGHT UPPER QUADRANT COMPARISON:  Noncontrast CT abdomen pelvis 04/07/2020 FINDINGS: Gallbladder: At least 1 mobile gallstone is seen within the gallbladder fundus measuring 7 mm in diameter. The gallbladder, however, is not distended, there is no gallbladder wall  thickening, and no pericholecystic fluid is identified. The sonographic Eulah Pont sign is reportedly negative. Common bile duct: Diameter: 6 mm in proximal diameter Liver: The left hepatic lobe is relatively small and there is enlargement of the right hepatic lobe in keeping with a Riedel's lobe variant. Parenchymal echogenicity and echotexture is normal. The liver contour is smooth. There is a circumscribed hypoechoic homogeneous solid lesion within the right hepatic lobe measuring 10 mm x 12 mm x 13 mm, best seen on image # [id number] which is indeterminate, but may represent a atypical hemangioma or post infectious or post inflammatory lesion. This is, however, indeterminate on this examination. Portal vein is patent on color Doppler imaging with normal direction of blood flow towards the liver. Other: Incidentally noted is diffusely increased renal cortical echogenicity of the right kidney suggesting changes of underlying medical renal disease. This appears slightly progressive since prior examination of 04/28/2016. Renal cortical thickness, however, is preserved. IMPRESSION: Cholelithiasis without sonographic evidence of acute cholecystitis. 13 mm hypoechoic lesion within the right hepatic lobe, indeterminate. This could be better  assessed with contrast enhanced MRI examination if clinically indicated. Alternatively, a follow-up sonogram in 6 months may be helpful to document stability. Electronically Signed   By: Helyn Numbers MD   On: 04/07/2020 16:57    Medications: Scheduled Meds: . vitamin C  1,000 mg Oral Daily  . fluticasone furoate-vilanterol  1 puff Inhalation Daily  . folic acid  1 mg Oral Daily  . hydroxychloroquine  200 mg Oral Daily  . melatonin  10 mg Oral QHS  . pantoprazole  40 mg Oral Daily   Or  . pantoprazole (PROTONIX) IV  40 mg Intravenous Daily  . senna-docusate  1 tablet Oral BID  . sodium bicarbonate  650 mg Oral Daily  . umeclidinium bromide  1 puff Inhalation Daily  . vitamin B-12  500 mcg Oral Daily  . [START ON 04/24/2020] Vitamin D (Ergocalciferol)  50,000 Units Oral Q30 days  . voxelotor  1,500 mg Oral Daily   Continuous Infusions: . cefTRIAXone (ROCEPHIN)  IV     PRN Meds:.acetaminophen, albuterol, cyclobenzaprine, docusate sodium, famotidine, HYDROmorphone (DILAUDID) injection, ondansetron **OR** ondansetron (ZOFRAN) IV, oxyCODONE, polyethylene glycol  Consultants:  Infectious disease  Nephrology  Procedures:  TEE Pending  Antibiotics:  IV Rocephin  Assessment/Plan: Active Problems:   Symptomatic anemia   AKI (acute kidney injury) (HCC)   Sickle cell anemia (HCC)   CKD (chronic kidney disease) stage 2, GFR 60-89 ml/min   Sickle cell pain crisis (HCC)   Sickle cell anemia with crisis (HCC)   Sickle cell crisis (HCC)   History of aneurysm   Heart murmur   Anemia of chronic disease   Sarcoidosis   Chronic pain syndrome   Sickle cell anemia with pain (HCC)  Symptomatic anemia in the setting of sickle cell disease: Today,hemoglobin is 5.5, he is s/p 2 units PRBCs.  Transfuse 1 additional unit and repeat posttransfusion CBC Patient has negative fecal occult blood, no signs or symptoms of bleeding. Continue to monitor closely.  Sickle cell disease with pain  crisis: Patient is opiate tolerant Discontinue Dilaudid every 2 hours as needed. Add full dose PCA.  IV fluids, 0.45% saline at 100 ml/hr Reevaluate pain scale regularly  We will continue Dilaudid 1 mg every 2 hours as needed.  Bacteremia/Sepsis:  Blood culture positive for E.Coli. Intiate Rocephin 2 g daily. More that likely r/t urinary tract infection. Will schedule TEE.  Consult infectious disease for further work up  and evaluation.   Leukocytosis: Improving. WBCs 23.8.Marland Kitchen Procalcitonin negative Continue Rocephin 2 g daily Blood culture positive for E.coli  Acute kidney injury superimposed on CKD:  Creatinine is markedly increased. Patient is followed by Dr. Liz Beach, [facility], but has been lost to follow up since January. According tor records, patient's baseline creatinine is 1.4-1.5. Consulted with Dr. Arlean Hopping, nephrology, recommended aggressive hydration. Patient to be evaluated by nephrology in am.    History of asthma:  Continue home medications. Patient is followed by [facility]. Patient was last seen on 01/24/2020 (Records reviewed by me).  History of sarcoidosis: Stable. Continue home medications  Probable urinary tract infection:  Urinalysis shows turbid appearance, moderate blood, large leukocytes, large WBCs, many bacteria, mucus and WBC clumps.  We will continue to follow urine culture.  Initiate IV Rocephin    Code Status: Full Code Family Communication: N/A Disposition Plan: Not yet ready for discharge  Penni Penado Rennis Petty  APRN, MSN, FNP-C [facility] [address] [place], [place] [address] [phone]  If 5PM-8AM, please contact night-coverage.  04/08/2020, 11:57 AM  LOS: 1 day

## 2020-04-08 NOTE — Progress Notes (Signed)
PHARMACY - PHYSICIAN COMMUNICATION CRITICAL VALUE ALERT - BLOOD CULTURE IDENTIFICATION (BCID)  Shawn Adams is an 44 y.o. male with sickle cell abemia who presented to [facility] on 04/07/2020 with a chief complaint of abdominal pain, fever and weakness. One of two bcx bottles has GNR (BCID = Ecoli)  Name of physician (or Provider) Contacted: Julianne Handler  Current antibiotics: none  Changes to prescribed antibiotics recommended:  - ceftriaxone 2 gm IV q24h  Results for orders placed or performed during the hospital encounter of 04/07/20  Blood Culture ID Panel (Reflexed) (Collected: 04/07/2020  3:52 PM)  Result Value Ref Range   Enterococcus species NOT DETECTED NOT DETECTED   Listeria monocytogenes NOT DETECTED NOT DETECTED   Staphylococcus species NOT DETECTED NOT DETECTED   Staphylococcus aureus (BCID) NOT DETECTED NOT DETECTED   Streptococcus species NOT DETECTED NOT DETECTED   Streptococcus agalactiae NOT DETECTED NOT DETECTED   Streptococcus pneumoniae NOT DETECTED NOT DETECTED   Streptococcus pyogenes NOT DETECTED NOT DETECTED   Acinetobacter baumannii NOT DETECTED NOT DETECTED   Enterobacteriaceae species DETECTED (A) NOT DETECTED   Enterobacter cloacae complex NOT DETECTED NOT DETECTED   Escherichia coli DETECTED (A) NOT DETECTED   Klebsiella oxytoca NOT DETECTED NOT DETECTED   Klebsiella pneumoniae NOT DETECTED NOT DETECTED   Proteus species NOT DETECTED NOT DETECTED   Serratia marcescens NOT DETECTED NOT DETECTED   Carbapenem resistance NOT DETECTED NOT DETECTED   Haemophilus influenzae NOT DETECTED NOT DETECTED   Neisseria meningitidis NOT DETECTED NOT DETECTED   Pseudomonas aeruginosa NOT DETECTED NOT DETECTED   Candida albicans NOT DETECTED NOT DETECTED   Candida glabrata NOT DETECTED NOT DETECTED   Candida krusei NOT DETECTED NOT DETECTED   Candida parapsilosis NOT DETECTED NOT DETECTED   Candida tropicalis NOT DETECTED NOT DETECTED    Arlester Marker, Paylin Hailu P 04/08/2020   10:22 AM

## 2020-04-09 ENCOUNTER — Encounter (HOSPITAL_COMMUNITY): Payer: Self-pay | Admitting: Internal Medicine

## 2020-04-09 DIAGNOSIS — A4151 Sepsis due to Escherichia coli [E. coli]: Secondary | ICD-10-CM

## 2020-04-09 DIAGNOSIS — Z8679 Personal history of other diseases of the circulatory system: Secondary | ICD-10-CM

## 2020-04-09 DIAGNOSIS — D869 Sarcoidosis, unspecified: Secondary | ICD-10-CM

## 2020-04-09 LAB — PROTEIN / CREATININE RATIO, URINE
Creatinine, Urine: 60.15 mg/dL
Protein Creatinine Ratio: 1.18 mg/mg{Cre} — ABNORMAL HIGH (ref 0.00–0.15)
Total Protein, Urine: 71 mg/dL

## 2020-04-09 LAB — COMPREHENSIVE METABOLIC PANEL
ALT: 23 U/L (ref 0–44)
AST: 28 U/L (ref 15–41)
Albumin: 2.9 g/dL — ABNORMAL LOW (ref 3.5–5.0)
Alkaline Phosphatase: 114 U/L (ref 38–126)
Anion gap: 13 (ref 5–15)
BUN: 109 mg/dL — ABNORMAL HIGH (ref 6–20)
CO2: 11 mmol/L — ABNORMAL LOW (ref 22–32)
Calcium: 8.7 mg/dL — ABNORMAL LOW (ref 8.9–10.3)
Chloride: 107 mmol/L (ref 98–111)
Creatinine, Ser: 6.98 mg/dL — ABNORMAL HIGH (ref 0.61–1.24)
GFR calc Af Amer: 10 mL/min — ABNORMAL LOW (ref >60)
GFR calc non Af Amer: 9 mL/min — ABNORMAL LOW (ref >60)
Glucose, Bld: 100 mg/dL — ABNORMAL HIGH (ref 70–99)
Potassium: 4.2 mmol/L (ref 3.5–5.1)
Sodium: 131 mmol/L — ABNORMAL LOW (ref 135–145)
Total Bilirubin: 2.1 mg/dL — ABNORMAL HIGH (ref 0.3–1.2)
Total Protein: 6.8 g/dL (ref 6.5–8.1)

## 2020-04-09 LAB — PROCALCITONIN: Procalcitonin: 37.53 ng/mL

## 2020-04-09 LAB — CBC
HCT: 16.9 % — ABNORMAL LOW (ref 39.0–52.0)
Hemoglobin: 6.1 g/dL — CL (ref 13.0–17.0)
MCH: 32.3 pg (ref 26.0–34.0)
MCHC: 36.1 g/dL — ABNORMAL HIGH (ref 30.0–36.0)
MCV: 89.4 fL (ref 80.0–100.0)
Platelets: 238 10*3/uL (ref 150–400)
RBC: 1.89 MIL/uL — ABNORMAL LOW (ref 4.22–5.81)
RDW: 19.2 % — ABNORMAL HIGH (ref 11.5–15.5)
WBC: 20.4 10*3/uL — ABNORMAL HIGH (ref 4.0–10.5)
nRBC: 1.1 % — ABNORMAL HIGH (ref 0.0–0.2)

## 2020-04-09 NOTE — Progress Notes (Addendum)
Subjective: Shawn Adams is a 44 year old male with a medical history significant for sickle cell disease, history of symptomatic anemia, history of sarcoidosis, and stage III chronic kidney disease was admitted for symptomatic anemia in the setting of sickle cell pain crisis.  Today, patient's hemoglobin has improved to 6.1, which is much improved from 3.9 at admission.  He is status post 3 units PRBCs.  Patient states that he feels better today.  Pain intensity has improved to 2/10.  Pain is primarily to low back. Serum creatinine is markedly elevated at 6.9, which is minimally improved from previous.  He is followed by nephrology.  Blood culture was positive for E. coli.  Patient continues to be afebrile and oxygen saturation is 99% on 2 L.  He denies any dizziness, headache, chest pain, paresthesias, urinary frequency, urgency, retention, nausea, vomiting, or diarrhea.  Objective:  Vital signs in last 24 hours:  Vitals:   04/08/20 2108 04/09/20 0803 04/09/20 1100 04/09/20 1153  BP: (!) 106/59  (!) 101/56   Pulse: 88  93   Resp: 20  18   Temp: 98 F (36.7 C)  98.1 F (36.7 C)   TempSrc: Oral  Oral   SpO2: 92% 99% 98%   Weight:    56.6 kg  Height:    6' (1.829 m)    Intake/Output from previous day:   Intake/Output Summary (Last 24 hours) at 04/09/2020 1415 Last data filed at 04/09/2020 1145 Gross per 24 hour  Intake 1279.23 ml  Output 2100 ml  Net -820.77 ml    Physical Exam: General: Alert, awake, oriented x3, in no acute distress.  HEENT: [place]/AT PEERL, EOMI.  Scleral icterus Neck: Trachea midline,  no masses, no thyromegal,y no JVD, no carotid bruit OROPHARYNX: Very dry, No exudate/ erythema/lesions.  Heart: Regular rate and rhythm, without murmurs, rubs, gallops, PMI non-displaced, no heaves or thrills on palpation.  Lungs: Clear to auscultation, no wheezing or rhonchi noted. No increased vocal fremitus resonant to percussion  Abdomen: Soft, nontender, nondistended,  positive bowel sounds, no masses no hepatosplenomegaly noted..  Neuro: No focal neurological deficits noted cranial nerves II through XII grossly intact. DTRs 2+ bilaterally upper and lower extremities. Strength 5 out of 5 in bilateral upper and lower extremities. Musculoskeletal: No warm swelling or erythema around joints, no spinal tenderness noted. Psychiatric: Patient alert and oriented x3, good insight and cognition, good recent to remote recall. Lymph node survey: No cervical axillary or inguinal lymphadenopathy noted.  Lab Results:  Basic Metabolic Panel:    Component Value Date/Time   NA 131 (L) 04/09/2020 0518   NA 138 05/04/2018 1352   K 4.2 04/09/2020 0518   CL 107 04/09/2020 0518   CO2 11 (L) 04/09/2020 0518   BUN 109 (H) 04/09/2020 0518   BUN 15 05/04/2018 1352   CREATININE 6.98 (H) 04/09/2020 0518   CREATININE 1.39 (H) 05/11/2016 1100   GLUCOSE 100 (H) 04/09/2020 0518   CALCIUM 8.7 (L) 04/09/2020 0518   CBC:    Component Value Date/Time   WBC 20.4 (H) 04/09/2020 0518   HGB 6.1 (LL) 04/09/2020 0518   HGB 6.2 (LL) 05/08/2018 1107   HCT 16.9 (L) 04/09/2020 0518   HCT 17.0 (LL) 05/08/2018 1107   PLT 238 04/09/2020 0518   PLT 431 05/08/2018 1107   MCV 89.4 04/09/2020 0518   MCV 98 (H) 05/08/2018 1107   NEUTROABS 5.8 04/12/2019 0416   NEUTROABS 3.4 05/08/2018 1107   LYMPHSABS 0.5 (L) 04/12/2019 4098  LYMPHSABS 3.1 05/08/2018 1107   MONOABS 0.1 04/12/2019 0416   EOSABS 0.0 04/12/2019 0416   EOSABS 0.1 05/08/2018 1107   BASOSABS 0.0 04/12/2019 0416   BASOSABS 0.3 (H) 05/08/2018 1107    Recent Results (from the past 240 hour(s))  Culture, blood (routine x 2)     Status: Abnormal (Preliminary result)   Collection Time: 04/07/20  3:52 PM   Specimen: BLOOD  Result Value Ref Range Status   Specimen Description   Final    BLOOD RIGHT ANTECUBITAL Performed at [facility], [address] [place], [place] [address]    Special Requests   Final     BOTTLES DRAWN AEROBIC AND ANAEROBIC Blood Culture adequate volume Performed at [facility], [address] [place], [place] [address]    Culture  Setup Time   Final    GRAM NEGATIVE RODS IN BOTH AEROBIC AND ANAEROBIC BOTTLES CRITICAL RESULT CALLED TO, READ BACK BY AND VERIFIED WITH: PHARMD M SWAYNE [id number] AT 927 AM BY CM Performed at [facility], [address] [place], [place] [address]    Culture ESCHERICHIA COLI (A)  Final   Report Status PENDING  Incomplete  Blood Culture ID Panel (Reflexed)     Status: Abnormal   Collection Time: 04/07/20  3:52 PM  Result Value Ref Range Status   Enterococcus species NOT DETECTED NOT DETECTED Final   Listeria monocytogenes NOT DETECTED NOT DETECTED Final   Staphylococcus species NOT DETECTED NOT DETECTED Final   Staphylococcus aureus (BCID) NOT DETECTED NOT DETECTED Final   Streptococcus species NOT DETECTED NOT DETECTED Final   Streptococcus agalactiae NOT DETECTED NOT DETECTED Final   Streptococcus pneumoniae NOT DETECTED NOT DETECTED Final   Streptococcus pyogenes NOT DETECTED NOT DETECTED Final   Acinetobacter baumannii NOT DETECTED NOT DETECTED Final   Enterobacteriaceae species DETECTED (A) NOT DETECTED Final    Comment: Enterobacteriaceae represent a large family of gram-negative bacteria, not a single organism. CRITICAL RESULT CALLED TO, READ BACK BY AND VERIFIED WITH: PHARMD M SWAYNE [id number] AT 927 AM BY CM    Enterobacter cloacae complex NOT DETECTED NOT DETECTED Final   Escherichia coli DETECTED (A) NOT DETECTED Final    Comment: CRITICAL RESULT CALLED TO, READ BACK BY AND VERIFIED WITH: PHARMD M SWAYNE [id number] AT 927 AM BY CM    Klebsiella oxytoca NOT DETECTED NOT DETECTED Final   Klebsiella pneumoniae NOT DETECTED NOT DETECTED Final   Proteus species NOT DETECTED NOT DETECTED Final   Serratia marcescens NOT DETECTED NOT DETECTED Final   Carbapenem resistance NOT DETECTED NOT DETECTED Final    Haemophilus influenzae NOT DETECTED NOT DETECTED Final   Neisseria meningitidis NOT DETECTED NOT DETECTED Final   Pseudomonas aeruginosa NOT DETECTED NOT DETECTED Final   Candida albicans NOT DETECTED NOT DETECTED Final   Candida glabrata NOT DETECTED NOT DETECTED Final   Candida krusei NOT DETECTED NOT DETECTED Final   Candida parapsilosis NOT DETECTED NOT DETECTED Final   Candida tropicalis NOT DETECTED NOT DETECTED Final    Comment: Performed at [facility], [address] [place], [place] [address]  SARS Coronavirus 2 by RT PCR (hospital order, performed in [facility] hospital lab) Nasopharyngeal Nasopharyngeal Swab     Status: None   Collection Time: 04/07/20  4:22 PM   Specimen: Nasopharyngeal Swab  Result Value Ref Range Status   SARS Coronavirus 2 NEGATIVE NEGATIVE Final    Comment: (NOTE) SARS-CoV-2 target nucleic acids are NOT  DETECTED.  The SARS-CoV-2 RNA is generally detectable in upper and lower respiratory specimens during the acute phase of infection. The lowest concentration of SARS-CoV-2 viral copies this assay can detect is 250 copies / mL. A negative result does not preclude SARS-CoV-2 infection and should not be used as the sole basis for treatment or other patient management decisions.  A negative result may occur with improper specimen collection / handling, submission of specimen other than nasopharyngeal swab, presence of viral mutation(s) within the areas targeted by this assay, and inadequate number of viral copies (<250 copies / mL). A negative result must be combined with clinical observations, patient history, and epidemiological information.  Fact Sheet for Patients:   BoilerBrush.com.cy  Fact Sheet for Healthcare Providers: https://pope.com/  This test is not yet approved or  cleared by the [place] FDA and has been authorized for detection and/or diagnosis of SARS-CoV-2 by FDA under an  Emergency Use Authorization (EUA).  This EUA will remain in effect (meaning this test can be used) for the duration of the COVID-19 declaration under Section 564(b)(1) of the Act, 21 U.S.C. section 360bbb-3(b)(1), unless the authorization is terminated or revoked sooner.  Performed at [facility], [address] [place], [place] [address]   Urine culture     Status: Abnormal (Preliminary result)   Collection Time: 04/07/20  6:12 PM   Specimen: Urine, Random  Result Value Ref Range Status   Specimen Description   Final    URINE, RANDOM Performed at [facility], [address], [place], [place] [address]    Special Requests   Final    NONE Performed at [facility], [address], [place], [place] [address]    Culture (A)  Final    >=100,000 COLONIES/mL ESCHERICHIA COLI SUSCEPTIBILITIES TO FOLLOW Performed at [facility], [address] [place], [place] [address]    Report Status PENDING  Incomplete    Studies/Results: CT Abdomen Pelvis Wo Contrast  Result Date: 04/07/2020 CLINICAL DATA:  Abdominal pain and fever. History of sickle cell disease. EXAM: CT ABDOMEN AND PELVIS WITHOUT CONTRAST TECHNIQUE: Multidetector CT imaging of the abdomen and pelvis was performed following the standard protocol without IV contrast. COMPARISON:  None. FINDINGS: Lower chest: Scarring is identified within the posterior right lower lobe. No pleural effusion. Heart size is enlarged. Hepatobiliary: Hepatomegaly. Within the limitations of unenhanced technique there is no focal liver abnormality. Small stones are noted within the gallbladder measuring up to 4 mm. Pancreas: Unremarkable. No pancreatic ductal dilatation or surrounding inflammatory changes. Spleen: Auto infarcted calcified spleen. Adrenals/Urinary Tract: Normal appearance of the adrenal glands. No kidney mass or hydronephrosis. The urinary bladder is unremarkable. Stomach/Bowel:  Stomach appears normal. No bowel wall thickening, inflammation, or distension. Retro cecal appendix noted. No findings of acute appendicitis. Vascular/Lymphatic: Normal appearance of the abdominal aorta. No abdominopelvic adenopathy identified. No inguinal adenopathy. Reproductive: Prostate is unremarkable. Other: No free fluid or fluid collections. Musculoskeletal: Bony changes of sickle cell disease noted. No acute or suspicious osseous findings. IMPRESSION: 1. No acute findings identified within the abdomen or pelvis. 2. Gallstones. 3. Hepatomegaly. 4. Bony changes of sickle cell disease. Electronically Signed   By: Signa Kell M.D.   On: 04/07/2020 16:09   US RENAL  Result Date: 04/08/2020 CLINICAL DATA:  Acute renal injury, stage III renal insufficiency EXAM: RENAL / URINARY TRACT ULTRASOUND COMPLETE COMPARISON:  04/28/2016 FINDINGS: Right Kidney: Renal measurements: 13.7 x 5.4 x 5.9 cm =  volume: 231 mL. Renal cortical echogenicity is diffusely increased, progressive since prior examination. Renal cortical thickness, however, has been preserved. There is no hydronephrosis. No intrarenal masses or calcifications are seen. Left Kidney: Renal measurements: 11.9 x 6.3 x 6.0 cm = volume: 239 mL. There is limited evaluation of the upper pole of the left kidney due to overlying osseous structure. Renal cortical echogenicity is diffusely increased. This is progressive since prior examination. Renal cortical thickness, however, has been preserved. No hydronephrosis. No intrarenal masses or calcifications. Bladder: The bladder is distended and a small amount of layering debris is seen within the bladder lumen. Other: None. IMPRESSION: Diffusely increased renal cortical echogenicity, in keeping with underlying medical renal disease, progressive since prior examination. Layering debris within the bladder lumen. Electronically Signed   By: Helyn NumbersAshesh  Parikh MD   On: 04/08/2020 21:40   DG Chest Port 1 View  Result  Date: 04/07/2020 CLINICAL DATA:  Chest pain EXAM: PORTABLE CHEST 1 VIEW COMPARISON:  09/26/2019 FINDINGS: Heart is borderline in size. Bulky mediastinal and hilar adenopathy, likely related to known sarcoidosis. This is stable since prior study. No confluent opacities or effusions. No acute bony abnormality. IMPRESSION: Borderline heart size. Mediastinal and hilar adenopathy compatible with sarcoidosis. No acute cardiopulmonary disease. Electronically Signed   By: Charlett NoseKevin  Dover M.D.   On: 04/07/2020 16:25   US Abdomen Limited RUQ  Result Date: 04/07/2020 CLINICAL DATA:  Cholelithiasis EXAM: ULTRASOUND ABDOMEN LIMITED RIGHT UPPER QUADRANT COMPARISON:  Noncontrast CT abdomen pelvis 04/07/2020 FINDINGS: Gallbladder: At least 1 mobile gallstone is seen within the gallbladder fundus measuring 7 mm in diameter. The gallbladder, however, is not distended, there is no gallbladder wall thickening, and no pericholecystic fluid is identified. The sonographic Eulah PontMurphy sign is reportedly negative. Common bile duct: Diameter: 6 mm in proximal diameter Liver: The left hepatic lobe is relatively small and there is enlargement of the right hepatic lobe in keeping with a Riedel's lobe variant. Parenchymal echogenicity and echotexture is normal. The liver contour is smooth. There is a circumscribed hypoechoic homogeneous solid lesion within the right hepatic lobe measuring 10 mm x 12 mm x 13 mm, best seen on image # [id number] which is indeterminate, but may represent a atypical hemangioma or post infectious or post inflammatory lesion. This is, however, indeterminate on this examination. Portal vein is patent on color Doppler imaging with normal direction of blood flow towards the liver. Other: Incidentally noted is diffusely increased renal cortical echogenicity of the right kidney suggesting changes of underlying medical renal disease. This appears slightly progressive since prior examination of 04/28/2016. Renal cortical thickness,  however, is preserved. IMPRESSION: Cholelithiasis without sonographic evidence of acute cholecystitis. 13 mm hypoechoic lesion within the right hepatic lobe, indeterminate. This could be better assessed with contrast enhanced MRI examination if clinically indicated. Alternatively, a follow-up sonogram in 6 months may be helpful to document stability. Electronically Signed   By: Helyn NumbersAshesh  Parikh MD   On: 04/07/2020 16:57    Medications: Scheduled Meds: . vitamin C  1,000 mg Oral Daily  . fluticasone furoate-vilanterol  1 puff Inhalation Daily  . folic acid  1 mg Oral Daily  . hydroxychloroquine  200 mg Oral Daily  . melatonin  10 mg Oral QHS  . pantoprazole  40 mg Oral Daily   Or  . pantoprazole (PROTONIX) IV  40 mg Intravenous Daily  . senna-docusate  1 tablet Oral BID  . sodium bicarbonate  650 mg Oral Daily  . umeclidinium bromide  1 puff  Inhalation Daily  . vitamin B-12  500 mcg Oral Daily  . [START ON 04/24/2020] Vitamin D (Ergocalciferol)  50,000 Units Oral Q30 days  . voxelotor  1,500 mg Oral Daily   Continuous Infusions: . sodium chloride 100 mL/hr at 04/09/20 1154  . cefTRIAXone (ROCEPHIN)  IV 2 g (04/09/20 1053)   PRN Meds:.acetaminophen, albuterol, cyclobenzaprine, docusate sodium, famotidine, HYDROmorphone (DILAUDID) injection, ondansetron **OR** ondansetron (ZOFRAN) IV, oxyCODONE, polyethylene glycol  Consultants:  Nephrology  Infectious disease  Procedures:  Renal US  Antibiotics:  IV Rocephin  Assessment/Plan: Active Problems:   Anemia   AKI (acute kidney injury) (HCC)   Sickle cell anemia (HCC)   CKD (chronic kidney disease) stage 2, GFR 60-89 ml/min   Sickle cell pain crisis (HCC)   Sickle cell anemia with crisis (HCC)   Sickle cell crisis (HCC)   History of aneurysm   Heart murmur   Anemia of chronic disease   Sarcoidosis   Chronic pain syndrome   Sickle cell anemia with pain (HCC)   Sepsis with acute renal failure without septic shock  (HCC)   Symptomatic anemia: Today, hemoglobin has improved to 6.1.  He is s/p 3 units PRBCs.  Continue to follow closely.  Repeat CBC in a.m.  Sickle cell disease with pain crisis: Pain is mostly resolved.  Continue Dilaudid 1 mg every 4 hours as needed for severe pain IV fluids, 0.45% saline at 150 mL/h Reevaluate pain scale regularly, monitor vital signs closely, and supplemental oxygen as needed.  Bacteremia/Urosepsis: Blood culture and urine culture positive for E. coli.  Continue IV Rocephin.  Patient evaluated by infectious disease, will continue to follow.  TEE pending.  Leukocytosis: Much better on today.  Continue IV Rocephin.  Repeat CBC in a.m.  Acute kidney injury superimposed on chronic kidney disease: Nephrology consulted.  No urgent indication for RRT at this time.  Nephrology will continue to follow, their input is much appreciated.  History of asthma: Continue home medications.  Followed as an outpatient by [facility] pulmonology.  History of sarcoidosis: Stable.  Continue home medications. Code Status: Full Code Family Communication: N/A Disposition Plan: Not yet ready for discharge  Asra Gambrel Rennis Petty  APRN, MSN, FNP-C [facility] [address] [place], [place] [address] [phone]  If 5PM-7AM, please contact night-coverage.  04/09/2020, 2:15 PM  LOS: 2 days

## 2020-04-09 NOTE — Progress Notes (Signed)
[facility]    Date of Admission:  04/07/2020   Total days of antibiotics 3          ID: Shawn Adams is a 44 y.o. male with   Principal Problem:   Sepsis due to Escherichia coli (E. coli) (HCC) Active Problems:   Anemia   AKI (acute kidney injury) (HCC)   Sickle cell anemia (HCC)   CKD (chronic kidney disease) stage 2, GFR 60-89 ml/min   Sickle cell pain crisis (HCC)   Sickle cell anemia with crisis (HCC)   Sickle cell crisis (HCC)   History of aneurysm   Heart murmur   Anemia of chronic disease   Sarcoidosis   Chronic pain syndrome   Sickle cell anemia with pain (HCC)   Sepsis with acute renal failure without septic shock (HCC)    Subjective: Afebrile, denies dysuria, but reports some shortness of breath  Medications:  . vitamin C  1,000 mg Oral Daily  . fluticasone furoate-vilanterol  1 puff Inhalation Daily  . folic acid  1 mg Oral Daily  . hydroxychloroquine  200 mg Oral Daily  . melatonin  10 mg Oral QHS  . pantoprazole  40 mg Oral Daily   Or  . pantoprazole (PROTONIX) IV  40 mg Intravenous Daily  . senna-docusate  1 tablet Oral BID  . sodium bicarbonate  650 mg Oral Daily  . umeclidinium bromide  1 puff Inhalation Daily  . vitamin B-12  500 mcg Oral Daily  . [START ON 04/24/2020] Vitamin D (Ergocalciferol)  50,000 Units Oral Q30 days  . voxelotor  1,500 mg Oral Daily    Objective: Vital signs in last 24 hours: Temp:  [98 F (36.7 C)-98.1 F (36.7 C)] 98.1 F (36.7 C) (07/15 1100) Pulse Rate:  [88-93] 93 (07/15 1100) Resp:  [18-20] 18 (07/15 1100) BP: (101-106)/(56-59) 101/56 (07/15 1100) SpO2:  [92 %-99 %] 98 % (07/15 1100) Weight:  [56.6 kg] 56.6 kg (07/15 1153) Physical Exam  Constitutional: He is oriented to person, place, and time. He appears well-developed and well-nourished. No distress.  HENT:  Mouth/Throat: Oropharynx is clear and moist. No oropharyngeal exudate.  Cardiovascular: Normal rate, regular rhythm and  normal heart sounds. Exam reveals no gallop and no friction rub.  No murmur heard.  Pulmonary/Chest: Effort normal and breath sounds normal. No respiratory distress. He has no wheezes.  Abdominal: Soft. Bowel sounds are normal. He exhibits no distension. There is no tenderness.  Lymphadenopathy:  He has no cervical adenopathy.  Neurological: He is alert and oriented to person, place, and time.  Skin: Skin is warm and dry. No rash noted. No erythema.  Psychiatric: He has a normal mood and affect but nonsequitor speech    Lab Results Recent Labs    04/08/20 1456 04/09/20 0518  WBC 21.2* 20.4*  HGB 5.9* 6.1*  HCT 16.2* 16.9*  NA 131* 131*  K 4.4 4.2  CL 104 107  CO2 12* 11*  BUN 113* 109*  CREATININE 7.43* 6.98*   Liver Panel Recent Labs    04/08/20 0615 04/09/20 0518  PROT 6.6 6.8  ALBUMIN 3.0* 2.9*  AST 36 28  ALT 24 23  ALKPHOS 88 114  BILITOT 3.0* 2.1*    Microbiology: Reviewed- sensitivities pending Studies/Results: US RENAL  Result Date: 04/08/2020 CLINICAL DATA:  Acute renal injury, stage III renal insufficiency EXAM: RENAL / URINARY TRACT ULTRASOUND COMPLETE COMPARISON:  04/28/2016 FINDINGS: Right Kidney: Renal measurements: 13.7 x 5.4 x  5.9 cm = volume: 231 mL. Renal cortical echogenicity is diffusely increased, progressive since prior examination. Renal cortical thickness, however, has been preserved. There is no hydronephrosis. No intrarenal masses or calcifications are seen. Left Kidney: Renal measurements: 11.9 x 6.3 x 6.0 cm = volume: 239 mL. There is limited evaluation of the upper pole of the left kidney due to overlying osseous structure. Renal cortical echogenicity is diffusely increased. This is progressive since prior examination. Renal cortical thickness, however, has been preserved. No hydronephrosis. No intrarenal masses or calcifications. Bladder: The bladder is distended and a small amount of layering debris is seen within the bladder lumen. Other:  None. IMPRESSION: Diffusely increased renal cortical echogenicity, in keeping with underlying medical renal disease, progressive since prior examination. Layering debris within the bladder lumen. Electronically Signed   By: Helyn Numbers MD   On: 04/08/2020 21:40     Assessment/Plan: 44yo M with sickle cell, sarcoidosis, admitted for E coli urosepsis with bacteremia and AKI. Cr not much improved.  -continue with ceftriaxone for now. Await sensitivities and further improvement before transitioning to oral abtx  aki = appreciate renal consultation to help to see whether further evaluation is needed. Await to see he improves from ATN  [facility] Cell: [phone] Pager: [phone]  04/09/2020, 5:40 PM

## 2020-04-09 NOTE — Consult Note (Addendum)
Renal Service Consult Note [facility]  Rayder Sullenger 04/09/2020 Sol Blazing Requesting Physician:  Dr Doreene Burke  Reason for Consult:  Renal failure HPI: The patient is a 44 y.o. year-old w/ hx of sickle cell anemia, sarcoidosis and CKD 3 f/b CKA.  Pt presented w/ abd pain and fatiguex 2 days, then had chest pain and subjective fevers and chills. No n/v. +diarrhea x 1.  No SOB or HA.  In ED wBC 32K, creat 7, Hb 4.  IVF's started and prbc's x 2 ordered and IV pain meds.  CT Abd was negative, RUQ [place] was negative. Pt was admitted. Asked to see for renal failure.   Pt seen in room. CP a little better, no cough, SOB , no flank pain or hematuria. Sees a kidney doctor in [place], Dr Jolinda Croak.  Baseline creat 1.4- 1.7.    Last admit Oct 2019 for sickle crisis, anemia, CKD 3 , asthma   ROS  denies CP  no joint pain   no HA  no blurry vision  no rash  no diarrhea  no nausea/ vomiting  no dysuria  no difficulty voiding  no change in urine color    Past Medical History  Past Medical History:  Diagnosis Date  . Aneurysm ([place])    Pt states happened in 2011, Brain   . Heart murmur   . Sarcoid   . Sickle cell anemia (HCC)    Past Surgical History  Past Surgical History:  Procedure Laterality Date  . LYMPH NODE BIOPSY    . NASAL SINUS SURGERY     Family History  Family History  Problem Relation Age of Onset  . Diabetes Mother   . Hypertension Father   . Lupus Sister    Social History  reports that he has quit smoking. His smoking use included cigarettes. He has never used smokeless tobacco. He reports previous alcohol use of about 1.0 standard drink of alcohol per week. He reports previous drug use. Drug: Marijuana. Allergies No Known Allergies Home medications Prior to Admission medications   Medication Sig Start Date End Date Taking? Authorizing Provider  acetaminophen (TYLENOL) 500 MG tablet Take 500 mg by mouth every 6 (six) hours as needed for moderate  pain or fever (pain).    Yes [provider]  albuterol (PROVENTIL HFA;VENTOLIN HFA) 108 (90 Base) MCG/ACT inhaler Inhale 2 puffs into the lungs every 4 (four) hours as needed for wheezing or shortness of breath (cough, shortness of breath or wheezing.). Patient taking differently: Inhale 2 puffs into the lungs every 4 (four) hours as needed for wheezing or shortness of breath.  09/21/17  Yes Dorena Dew, FNP  Ascorbic Acid (VITAMIN C) 1000 MG tablet Take 1,000 mg by mouth daily.   Yes [provider]  budesonide-formoterol (SYMBICORT) 160-4.5 MCG/ACT inhaler Inhale 2 puffs into the lungs 2 (two) times daily. 05/10/18 04/07/20 Yes [provider]  cyclobenzaprine (FLEXERIL) 10 MG tablet Take 1 tablet (10 mg total) by mouth 3 (three) times daily as needed for muscle spasms. 04/09/18  Yes Street, [place], PA-C  docusate sodium (COLACE) 100 MG capsule Take 100 mg by mouth daily as needed for mild constipation.   Yes [provider]  ergocalciferol (VITAMIN D2) 1.25 MG (50000 UT) capsule Take 50,000 Units by mouth every 30 (thirty) days.  10/24/19  Yes [provider]  famotidine (PEPCID) 10 MG tablet Take 10 mg by mouth daily as needed for heartburn or indigestion.   Yes [provider]  folic acid (FOLVITE) 1 MG tablet Take 1 tablet (1 mg total) by mouth daily. 02/09/16  Yes [name], MD  hydroxychloroquine (PLAQUENIL) 200 MG tablet Take 200 mg by mouth daily. 01/13/20  Yes [provider]  hydroxyurea (HYDREA) 500 MG capsule Take 2 capsules (1,000 mg total) by mouth daily. May take with food to minimize GI side effects. Patient taking differently: Take 500 mg by mouth 2 (two) times daily. May take with food to minimize GI side effects. 02/09/16  Yes [name], [name], MD  INCRUSE ELLIPTA 62.5 MCG/INH AEPB Inhale 1 puff into the lungs daily. 03/17/20  Yes [provider]  Melatonin 10 MG TABS Take 10 mg by mouth at  bedtime.   Yes [provider]  meloxicam (MOBIC) 15 MG tablet Take 15 mg by mouth daily. 03/10/20  Yes [provider]  ondansetron (ZOFRAN) 4 MG tablet Take 1 tablet (4 mg total) by mouth every 6 (six) hours. Patient taking differently: Take 4 mg by mouth every 8 (eight) hours as needed for nausea or vomiting.  12/21/15  Yes Hedges, Dellis Filbert, PA-C  OXBRYTA 500 MG TABS tablet Take 1,500 mg by mouth.  03/03/20  Yes [provider]  Oxycodone HCl 20 MG TABS Take 20 mg by mouth every 4 (four) hours as needed (pain).    Yes [provider]  sodium bicarbonate 650 MG tablet Take 650 mg by mouth daily.  10/14/19  Yes [provider]  vitamin B-12 (CYANOCOBALAMIN) 500 MCG tablet Take 500 mcg by mouth daily.   Yes [provider]  amoxicillin-clavulanate (AUGMENTIN) 875-125 MG tablet Take 1 tablet by mouth 2 (two) times daily. One po bid x 7 days Patient not taking: Reported on 04/07/2020 06/03/19   Margarita Mail, PA-C  predniSONE (DELTASONE) 10 MG tablet Take 40 mg by mouth daily. Patient not taking: Reported on 04/07/2020 04/09/19   [provider]     Vitals:   04/08/20 1257 04/08/20 1628 04/08/20 2108 04/09/20 0803  BP: 118/63  (!) 106/59   Pulse: 85  88   Resp: 20  20   Temp: 98.3 F (36.8 C)  98 F (36.7 C)   TempSrc: Oral  Oral   SpO2: 99%  92% 99%  Weight:  58.2 kg    Height:       Exam Gen alert, no asterixis, distracted No rash, cyanosis or gangrene Sclera anicteric, throat clear   +JVD no bruits Chest clear bilat to bases no rales/ wheezing RRR no MRG Abd soft ntnd no mass or ascites +bs GU normal male MS no joint effusions or deformity Ext no leg or UE edema, no wounds or ulcers  Neuro is alert, Ox 3 , nf    Home meds:  - sod bicarb 650 qd/ pepcid 10 prn  - prednisone 40 qd  - hydroxyurea 500 bid/ plaquenil 200 qd  - incruse ellipta 1 qd/ symbicort 2 bid   - mobic 15 qd  - oxbryta 1500 mg po  - oxy IR '20mg'$  qid  prn  - prn's/ vitamins/ supplements   UA - turbid, >300 prot, many bact, 21-50 rbc, >50 wbc  UNa 47, UCr 59   Renal US - 13 cm kidneys w/o hydro, stable cortical thickness but new ^'d echotexture bilat    WBC 10   Hb 6.1  ptl 238    Date   Creat  eGFR  2012   0.87  2015   2.3- 2.7  2016- 17  1.2- 1.8   2018- 19  1.5- 2.2 40- 64  May-July 2020 1.4- 1.6 > 60  04/09/20  6.98      Assessment/ Plan: 1. AoCKD 3a - baseline creat 1.4-1.7, eGFR 45- 65.  Creat here 7.0, in setting of poor po intake/ COX II inhibitor/ urosepsis w/ EColi growing in urine/ blood cx's. BP's soft. UOP good w/ IVF"s overnight, no edema on exam. Getting IV abx for infection. Renal US new [place] Pace poss due to ATN.  Nonoliguric. No urgent indication for RRT at this time, will follow closely. Will check UP/C ratio w/ ^prot by dipstick. Repeat UA after 1-2 days of abx. Will follow.  2. Urosepsis - +ecoli in blood/ urine cx's, getting IV abx 3. Sickle cell anemia - Hb 4 >> 6.1 afer 2u prbcs, per primary.  4. Sarcoidosis - on pred 20 / d.       Kelly Splinter  MD 04/09/2020, 8:05 AM  Recent Labs  Lab 04/08/20 1456 04/09/20 0518  WBC 21.2* 20.4*  HGB 5.9* 6.1*   Recent Labs  Lab 04/08/20 1456 04/09/20 0518  K 4.4 4.2  BUN 113* 109*  CREATININE 7.43* 6.98*  CALCIUM 8.7* 8.7*

## 2020-04-10 DIAGNOSIS — A4151 Sepsis due to Escherichia coli [E. coli]: Principal | ICD-10-CM

## 2020-04-10 DIAGNOSIS — N178 Other acute kidney failure: Secondary | ICD-10-CM

## 2020-04-10 DIAGNOSIS — R109 Unspecified abdominal pain: Secondary | ICD-10-CM

## 2020-04-10 DIAGNOSIS — R079 Chest pain, unspecified: Secondary | ICD-10-CM

## 2020-04-10 DIAGNOSIS — D508 Other iron deficiency anemias: Secondary | ICD-10-CM

## 2020-04-10 LAB — COMPREHENSIVE METABOLIC PANEL
ALT: 23 U/L (ref 0–44)
AST: 27 U/L (ref 15–41)
Albumin: 3 g/dL — ABNORMAL LOW (ref 3.5–5.0)
Alkaline Phosphatase: 129 U/L — ABNORMAL HIGH (ref 38–126)
Anion gap: 10 (ref 5–15)
BUN: 110 mg/dL — ABNORMAL HIGH (ref 6–20)
CO2: 12 mmol/L — ABNORMAL LOW (ref 22–32)
Calcium: 9.2 mg/dL (ref 8.9–10.3)
Chloride: 111 mmol/L (ref 98–111)
Creatinine, Ser: 6.16 mg/dL — ABNORMAL HIGH (ref 0.61–1.24)
GFR calc Af Amer: 12 mL/min — ABNORMAL LOW (ref >60)
GFR calc non Af Amer: 10 mL/min — ABNORMAL LOW (ref >60)
Glucose, Bld: 112 mg/dL — ABNORMAL HIGH (ref 70–99)
Potassium: 4.1 mmol/L (ref 3.5–5.1)
Sodium: 133 mmol/L — ABNORMAL LOW (ref 135–145)
Total Bilirubin: 1.5 mg/dL — ABNORMAL HIGH (ref 0.3–1.2)
Total Protein: 7.1 g/dL (ref 6.5–8.1)

## 2020-04-10 LAB — URINALYSIS, ROUTINE W REFLEX MICROSCOPIC
Bilirubin Urine: NEGATIVE
Glucose, UA: NEGATIVE mg/dL
Ketones, ur: NEGATIVE mg/dL
Nitrite: NEGATIVE
Protein, ur: 30 mg/dL — AB
Specific Gravity, Urine: 1.008 (ref 1.005–1.030)
WBC, UA: >50 WBC/hpf — ABNORMAL HIGH (ref 0–5)
pH: 6 (ref 5.0–8.0)

## 2020-04-10 LAB — CBC
HCT: 15.4 % — ABNORMAL LOW (ref 39.0–52.0)
Hemoglobin: 5.5 g/dL — CL (ref 13.0–17.0)
MCH: 31.8 pg (ref 26.0–34.0)
MCHC: 35.7 g/dL (ref 30.0–36.0)
MCV: 89 fL (ref 80.0–100.0)
Platelets: 246 10*3/uL (ref 150–400)
RBC: 1.73 MIL/uL — ABNORMAL LOW (ref 4.22–5.81)
RDW: 20.6 % — ABNORMAL HIGH (ref 11.5–15.5)
WBC: 16.6 10*3/uL — ABNORMAL HIGH (ref 4.0–10.5)
nRBC: 1.2 % — ABNORMAL HIGH (ref 0.0–0.2)

## 2020-04-10 LAB — URINE CULTURE: Culture: >=100000 — AB

## 2020-04-10 LAB — HEMOGLOBIN AND HEMATOCRIT, BLOOD
HCT: 17.3 % — ABNORMAL LOW (ref 39.0–52.0)
Hemoglobin: 5.9 g/dL — CL (ref 13.0–17.0)

## 2020-04-10 LAB — PREPARE RBC (CROSSMATCH)

## 2020-04-10 MED ORDER — SODIUM BICARBONATE 650 MG PO TABS
650.0000 mg | ORAL_TABLET | Freq: Three times a day (TID) | ORAL | Status: DC
  Administered 2020-04-10 – 2020-04-12 (×6): 650 mg via ORAL
  Filled 2020-04-10 (×6): qty 1

## 2020-04-10 MED ORDER — DIPHENHYDRAMINE HCL 25 MG PO CAPS
25.0000 mg | ORAL_CAPSULE | Freq: Once | ORAL | Status: AC
  Administered 2020-04-10: 25 mg via ORAL
  Filled 2020-04-10: qty 1

## 2020-04-10 MED ORDER — SODIUM CHLORIDE 0.45 % IV SOLN: INTRAVENOUS | Status: AC

## 2020-04-10 MED ORDER — ACETAMINOPHEN 325 MG PO TABS
650.0000 mg | ORAL_TABLET | Freq: Once | ORAL | Status: AC
  Administered 2020-04-10: 650 mg via ORAL
  Filled 2020-04-10: qty 2

## 2020-04-10 NOTE — Progress Notes (Addendum)
[facility]    Date of Admission:  04/07/2020   Total days of antibiotics 4           ID: Shawn Adams is a 44 y.o. male with ecoli sepsis with bacteremia Principal Problem:   Sepsis due to Escherichia coli (E. coli) (HCC) Active Problems:   Anemia   AKI (acute kidney injury) (HCC)   Sickle cell anemia (HCC)   CKD (chronic kidney disease) stage 2, GFR 60-89 ml/min   Sickle cell pain crisis (HCC)   Sickle cell anemia with crisis (HCC)   Sickle cell crisis (HCC)   History of aneurysm   Heart murmur   Anemia of chronic disease   Sarcoidosis   Chronic pain syndrome   Sickle cell anemia with pain (HCC)   Sepsis with acute renal failure without septic shock (HCC)    Subjective: afebrile  Medications:  . vitamin C  1,000 mg Oral Daily  . fluticasone furoate-vilanterol  1 puff Inhalation Daily  . folic acid  1 mg Oral Daily  . hydroxychloroquine  200 mg Oral Daily  . melatonin  10 mg Oral QHS  . pantoprazole  40 mg Oral Daily   Or  . pantoprazole (PROTONIX) IV  40 mg Intravenous Daily  . senna-docusate  1 tablet Oral BID  . sodium bicarbonate  650 mg Oral TID  . umeclidinium bromide  1 puff Inhalation Daily  . vitamin B-12  500 mcg Oral Daily  . [START ON 04/24/2020] Vitamin D (Ergocalciferol)  50,000 Units Oral Q30 days  . voxelotor  1,500 mg Oral Daily    Objective: Vital signs in last 24 hours: Temp:  [97.8 F (36.6 C)-99.2 F (37.3 C)] 98.6 F (37 C) (07/16 1307) Pulse Rate:  [67-91] 67 (07/16 1307) Resp:  [14-18] 14 (07/16 1307) BP: (109-122)/(60-71) 114/69 (07/16 1307) SpO2:  [97 %-100 %] 100 % (07/16 1307) Physical Exam  Constitutional: He is oriented to person, place, and time. He appears well-developed and well-nourished. No distress.  HENT:  Mouth/Throat: Oropharynx is clear and moist. No oropharyngeal exudate.  Cardiovascular: Normal rate, regular rhythm and normal heart sounds. Exam reveals no gallop and no friction rub.    No murmur heard.  Pulmonary/Chest: Effort normal and breath sounds normal. No respiratory distress. He has no wheezes.  Abdominal: Soft. Bowel sounds are normal. He exhibits no distension. There is no tenderness.  Lymphadenopathy:  He has no cervical adenopathy.  Neurological: He is alert and oriented to person, place, and time.  Skin: Skin is warm and dry. No rash noted. No erythema.  Psychiatric: He has a normal mood and affect. His behavior is normal.     Lab Results Recent Labs    04/09/20 0518 04/10/20 0459  WBC 20.4* 16.6*  HGB 6.1* 5.5*  HCT 16.9* 15.4*  NA 131* 133*  K 4.2 4.1  CL 107 111  CO2 11* 12*  BUN 109* 110*  CREATININE 6.98* 6.16*   Liver Panel Recent Labs    04/09/20 0518 04/10/20 0459  PROT 6.8 7.1  ALBUMIN 2.9* 3.0*  AST 28 27  ALT 23 23  ALKPHOS 114 129*  BILITOT 2.1* 1.5*   Sedimentation Rate No results for input(s): ESRSEDRATE in the last 72 hours. C-Reactive Protein No results for input(s): CRP in the last 72 hours.  Microbiology: Reviewed  Escherichia coli    MIC    AMPICILLIN 8 SENSITIVE  Sensitive    AMPICILLIN/SULBACTAM <=2 SENSITIVE  Sensitive  CEFAZOLIN <=4 SENSITIVE  Sensitive    CEFEPIME <=0.12 SENS... Sensitive    CEFTAZIDIME <=1 SENSITIVE  Sensitive    CEFTRIAXONE <=0.25 SENS... Sensitive    CIPROFLOXACIN <=0.25 SENS... Sensitive    GENTAMICIN <=1 SENSITIVE  Sensitive    IMIPENEM <=0.25 SENS... Sensitive    PIP/TAZO <=4 SENSITIVE  Sensitive    TRIMETH/SULFA <=20 SENSIT... Sensitive     Studies/Results: US RENAL  Result Date: 04/08/2020 CLINICAL DATA:  Acute renal injury, stage III renal insufficiency EXAM: RENAL / URINARY TRACT ULTRASOUND COMPLETE COMPARISON:  04/28/2016 FINDINGS: Right Kidney: Renal measurements: 13.7 x 5.4 x 5.9 cm = volume: 231 mL. Renal cortical echogenicity is diffusely increased, progressive since prior examination. Renal cortical thickness, however, has been preserved. There is no  hydronephrosis. No intrarenal masses or calcifications are seen. Left Kidney: Renal measurements: 11.9 x 6.3 x 6.0 cm = volume: 239 mL. There is limited evaluation of the upper pole of the left kidney due to overlying osseous structure. Renal cortical echogenicity is diffusely increased. This is progressive since prior examination. Renal cortical thickness, however, has been preserved. No hydronephrosis. No intrarenal masses or calcifications. Bladder: The bladder is distended and a small amount of layering debris is seen within the bladder lumen. Other: None. IMPRESSION: Diffusely increased renal cortical echogenicity, in keeping with underlying medical renal disease, progressive since prior examination. Layering debris within the bladder lumen. Electronically Signed   By: Helyn Numbers MD   On: 04/08/2020 21:40     Assessment/Plan: ecoli sepsis with secondary bacteremia = currently on ceftriaxone, appears improving, leukocytosis is improving still not back to baseline. Currently on day 4 of IV ceftriaxone. Recommend to treat for a total of 10 days. Can transition to oral on Sunday or when ready to discharge home. Can do amoxicillin or cephalexin.  Acute on chronic ckd = still slow to recover from ATN associated with sepsis  Anemia/sickle cell = continue to monitor for need for transfusion  Will sign off  [facility] Cell: [phone] Pager: [phone]  04/10/2020, 3:20 PM

## 2020-04-10 NOTE — Progress Notes (Signed)
Subjective: Shawn Adams is a 44 year old male with a medical history significant for sickle cell disease, history of symptomatic anemia, sarcoidosis, stage II chronic kidney disease, chronic pain syndrome, and opiate dependence was admitted for symptomatic anemia and sepsis in the setting of sickle cell pain crisis Patient's hemoglobin continues to be below baseline.  Today, hemoglobin is 5.5.  Patient denies fatigue, dizziness, chest pain, any abdominal discomfort nausea, vomiting, or diarrhea. He is having very little pain on today.  He endorses some low back pain with an intensity of 2/10.  Patient has not required Dilaudid for pain.  Creatinine is improving.  Patient is followed by nephrology.  He continues to be afebrile.  Oxygen saturation 100% on RA.  Patient appears to be resting comfortably. Objective:  Vital signs in last 24 hours:  Vitals:   04/10/20 1132 04/10/20 1133 04/10/20 1247 04/10/20 1307  BP: 120/71  121/71 114/69  Pulse: 75  69 67  Resp:  Temp: 98.6 F (37 C)   98.6 F (37 C)  TempSrc: Oral   Oral  SpO2: 99%  100% 100%  Weight:      Height:        Intake/Output from previous day:   Intake/Output Summary (Last 24 hours) at 04/10/2020 1604 Last data filed at 04/10/2020 1133 Gross per 24 hour  Intake 2153.33 ml  Output 1750 ml  Net 403.33 ml    Physical Exam: General: Alert, awake, oriented x3, in no acute distress.  HEENT: [place]/AT PEERL, EOMI.  Scleral icterus. Neck: Trachea midline,  no masses, no thyromegal,y no JVD, no carotid bruit OROPHARYNX:  Moist, No exudate/ erythema/lesions.  Heart: Regular rate and rhythm, without murmurs, rubs, gallops, PMI non-displaced, no heaves or thrills on palpation.  Lungs: Clear to auscultation, no wheezing or rhonchi noted. No increased vocal fremitus resonant to percussion  Abdomen: Soft, nontender, nondistended, positive bowel sounds, no masses no hepatosplenomegaly noted..  Neuro: No focal neurological  deficits noted cranial nerves II through XII grossly intact. DTRs 2+ bilaterally upper and lower extremities. Strength 5 out of 5 in bilateral upper and lower extremities. Musculoskeletal: No warm swelling or erythema around joints, no spinal tenderness noted. Psychiatric: Patient alert and oriented x3, good insight and cognition, good recent to remote recall. Lymph node survey: No cervical axillary or inguinal lymphadenopathy noted.  Lab Results:  Basic Metabolic Panel:    Component Value Date/Time   NA 133 (L) 04/10/2020 0459   NA 138 05/04/2018 1352   K 4.1 04/10/2020 0459   CL 111 04/10/2020 0459   CO2 12 (L) 04/10/2020 0459   BUN 110 (H) 04/10/2020 0459   BUN 15 05/04/2018 1352   CREATININE 6.16 (H) 04/10/2020 0459   CREATININE 1.39 (H) 05/11/2016 1100   GLUCOSE 112 (H) 04/10/2020 0459   CALCIUM 9.2 04/10/2020 0459   CBC:    Component Value Date/Time   WBC 16.6 (H) 04/10/2020 0459   HGB 5.9 (LL) 04/10/2020 1431   HGB 6.2 (LL) 05/08/2018 1107   HCT 17.3 (L) 04/10/2020 1431   HCT 17.0 (LL) 05/08/2018 1107   PLT 246 04/10/2020 0459   PLT 431 05/08/2018 1107   MCV 89.0 04/10/2020 0459   MCV 98 (H) 05/08/2018 1107   NEUTROABS 5.8 04/12/2019 0416   NEUTROABS 3.4 05/08/2018 1107   LYMPHSABS 0.5 (L) 04/12/2019 0416   LYMPHSABS 3.1 05/08/2018 1107   MONOABS 0.1 04/12/2019 0416   EOSABS 0.0 04/12/2019 0416   EOSABS 0.1 05/08/2018 1107  BASOSABS 0.0 04/12/2019 0416   BASOSABS 0.3 (H) 05/08/2018 1107    Recent Results (from the past 240 hour(s))  Culture, blood (routine x 2)     Status: Abnormal   Collection Time: 04/07/20  3:52 PM   Specimen: BLOOD  Result Value Ref Range Status   Specimen Description   Final    BLOOD RIGHT ANTECUBITAL Performed at [facility], [address] [place], [place] [address]    Special Requests   Final    BOTTLES DRAWN AEROBIC AND ANAEROBIC Blood Culture adequate volume Performed at [facility],  [address] [place], [place] [address]    Culture  Setup Time   Final    GRAM NEGATIVE RODS IN BOTH AEROBIC AND ANAEROBIC BOTTLES CRITICAL RESULT CALLED TO, READ BACK BY AND VERIFIED WITH: PHARMD M SWAYNE [id number] AT 927 AM BY CM Performed at [facility], [address], [place], [place] [address]    Culture ESCHERICHIA COLI (A)  Final   Report Status 04/10/2020 FINAL  Final   Organism ID, Bacteria ESCHERICHIA COLI  Final      Susceptibility   Escherichia coli - MIC*    AMPICILLIN 8 SENSITIVE Sensitive     CEFAZOLIN <=4 SENSITIVE Sensitive     CEFEPIME <=0.12 SENSITIVE Sensitive     CEFTAZIDIME <=1 SENSITIVE Sensitive     CEFTRIAXONE <=0.25 SENSITIVE Sensitive     CIPROFLOXACIN <=0.25 SENSITIVE Sensitive     GENTAMICIN <=1 SENSITIVE Sensitive     IMIPENEM <=0.25 SENSITIVE Sensitive     TRIMETH/SULFA <=20 SENSITIVE Sensitive     AMPICILLIN/SULBACTAM <=2 SENSITIVE Sensitive     PIP/TAZO <=4 SENSITIVE Sensitive     * ESCHERICHIA COLI  Blood Culture ID Panel (Reflexed)     Status: Abnormal   Collection Time: 04/07/20  3:52 PM  Result Value Ref Range Status   Enterococcus species NOT DETECTED NOT DETECTED Final   Listeria monocytogenes NOT DETECTED NOT DETECTED Final   Staphylococcus species NOT DETECTED NOT DETECTED Final   Staphylococcus aureus (BCID) NOT DETECTED NOT DETECTED Final   Streptococcus species NOT DETECTED NOT DETECTED Final   Streptococcus agalactiae NOT DETECTED NOT DETECTED Final   Streptococcus pneumoniae NOT DETECTED NOT DETECTED Final   Streptococcus pyogenes NOT DETECTED NOT DETECTED Final   Acinetobacter baumannii NOT DETECTED NOT DETECTED Final   Enterobacteriaceae species DETECTED (A) NOT DETECTED Final    Comment: Enterobacteriaceae represent a large family of gram-negative bacteria, not a single organism. CRITICAL RESULT CALLED TO, READ BACK BY AND VERIFIED WITH: PHARMD M SWAYNE [id number] AT 927 AM BY CM    Enterobacter cloacae complex NOT  DETECTED NOT DETECTED Final   Escherichia coli DETECTED (A) NOT DETECTED Final    Comment: CRITICAL RESULT CALLED TO, READ BACK BY AND VERIFIED WITH: PHARMD M SWAYNE [id number] AT 927 AM BY CM    Klebsiella oxytoca NOT DETECTED NOT DETECTED Final   Klebsiella pneumoniae NOT DETECTED NOT DETECTED Final   Proteus species NOT DETECTED NOT DETECTED Final   Serratia marcescens NOT DETECTED NOT DETECTED Final   Carbapenem resistance NOT DETECTED NOT DETECTED Final   Haemophilus influenzae NOT DETECTED NOT DETECTED Final   Neisseria meningitidis NOT DETECTED NOT DETECTED Final   Pseudomonas aeruginosa NOT DETECTED NOT DETECTED Final   Candida albicans NOT DETECTED NOT DETECTED Final   Candida glabrata NOT DETECTED NOT DETECTED Final   Candida krusei NOT DETECTED NOT DETECTED Final   Candida parapsilosis NOT DETECTED NOT  DETECTED Final   Candida tropicalis NOT DETECTED NOT DETECTED Final    Comment: Performed at [facility], [address] [place], [place] [address]  SARS Coronavirus 2 by RT PCR (hospital order, performed in [facility] hospital lab) Nasopharyngeal Nasopharyngeal Swab     Status: None   Collection Time: 04/07/20  4:22 PM   Specimen: Nasopharyngeal Swab  Result Value Ref Range Status   SARS Coronavirus 2 NEGATIVE NEGATIVE Final    Comment: (NOTE) SARS-CoV-2 target nucleic acids are NOT DETECTED.  The SARS-CoV-2 RNA is generally detectable in upper and lower respiratory specimens during the acute phase of infection. The lowest concentration of SARS-CoV-2 viral copies this assay can detect is 250 copies / mL. A negative result does not preclude SARS-CoV-2 infection and should not be used as the sole basis for treatment or other patient management decisions.  A negative result may occur with improper specimen collection / handling, submission of specimen other than nasopharyngeal swab, presence of viral mutation(s) within the areas targeted by this assay, and inadequate  number of viral copies (<250 copies / mL). A negative result must be combined with clinical observations, patient history, and epidemiological information.  Fact Sheet for Patients:   BoilerBrush.com.cyhttps://www.fda.gov/media/136312/download  Fact Sheet for Healthcare Providers: https://pope.com/https://www.fda.gov/media/136313/download  This test is not yet approved or  cleared by the [place] FDA and has been authorized for detection and/or diagnosis of SARS-CoV-2 by FDA under an Emergency Use Authorization (EUA).  This EUA will remain in effect (meaning this test can be used) for the duration of the COVID-19 declaration under Section 564(b)(1) of the Act, 21 U.S.C. section 360bbb-3(b)(1), unless the authorization is terminated or revoked sooner.  Performed at [facility], [address] [place], [place] [address]   Urine culture     Status: Abnormal   Collection Time: 04/07/20  6:12 PM   Specimen: Urine, Random  Result Value Ref Range Status   Specimen Description   Final    URINE, RANDOM Performed at [facility], [address] [place], [place] [address]    Special Requests   Final    NONE Performed at [facility], [address] [place], [place] [address]    Culture >=100,000 COLONIES/mL ESCHERICHIA COLI (A)  Final   Report Status 04/10/2020 FINAL  Final   Organism ID, Bacteria ESCHERICHIA COLI (A)  Final      Susceptibility   Escherichia coli - MIC*    AMPICILLIN 8 SENSITIVE Sensitive     CEFAZOLIN <=4 SENSITIVE Sensitive     CEFTRIAXONE <=0.25 SENSITIVE Sensitive     CIPROFLOXACIN <=0.25 SENSITIVE Sensitive     GENTAMICIN <=1 SENSITIVE Sensitive     IMIPENEM <=0.25 SENSITIVE Sensitive     NITROFURANTOIN <=16 SENSITIVE Sensitive     TRIMETH/SULFA <=20 SENSITIVE Sensitive     AMPICILLIN/SULBACTAM 4 SENSITIVE Sensitive     PIP/TAZO <=4 SENSITIVE Sensitive     * >=100,000 COLONIES/mL ESCHERICHIA COLI    Studies/Results: US  RENAL  Result Date: 04/08/2020 CLINICAL DATA:  Acute renal injury, stage III renal insufficiency EXAM: RENAL / URINARY TRACT ULTRASOUND COMPLETE COMPARISON:  04/28/2016 FINDINGS: Right Kidney: Renal measurements: 13.7 x 5.4 x 5.9 cm = volume: 231 mL. Renal cortical echogenicity is diffusely increased, progressive since prior examination. Renal cortical thickness, however, has been preserved. There is no hydronephrosis. No intrarenal masses or calcifications are seen. Left Kidney: Renal measurements: 11.9 x 6.3 x 6.0 cm = volume: 239  mL. There is limited evaluation of the upper pole of the left kidney due to overlying osseous structure. Renal cortical echogenicity is diffusely increased. This is progressive since prior examination. Renal cortical thickness, however, has been preserved. No hydronephrosis. No intrarenal masses or calcifications. Bladder: The bladder is distended and a small amount of layering debris is seen within the bladder lumen. Other: None. IMPRESSION: Diffusely increased renal cortical echogenicity, in keeping with underlying medical renal disease, progressive since prior examination. Layering debris within the bladder lumen. Electronically Signed   By: Helyn Numbers MD   On: 04/08/2020 21:40    Medications: Scheduled Meds: . vitamin C  1,000 mg Oral Daily  . fluticasone furoate-vilanterol  1 puff Inhalation Daily  . folic acid  1 mg Oral Daily  . hydroxychloroquine  200 mg Oral Daily  . melatonin  10 mg Oral QHS  . pantoprazole  40 mg Oral Daily   Or  . pantoprazole (PROTONIX) IV  40 mg Intravenous Daily  . senna-docusate  1 tablet Oral BID  . sodium bicarbonate  650 mg Oral TID  . umeclidinium bromide  1 puff Inhalation Daily  . vitamin B-12  500 mcg Oral Daily  . [START ON 04/24/2020] Vitamin D (Ergocalciferol)  50,000 Units Oral Q30 days  . voxelotor  1,500 mg Oral Daily   Continuous Infusions: . sodium chloride 100 mL/hr at 04/10/20 0635  . cefTRIAXone (ROCEPHIN)  IV  2 g (04/10/20 1042)   PRN Meds:.acetaminophen, albuterol, cyclobenzaprine, docusate sodium, famotidine, HYDROmorphone (DILAUDID) injection, ondansetron **OR** ondansetron (ZOFRAN) IV, oxyCODONE, polyethylene glycol  Consultants:  Infectious disease  Nephrology  Procedures:  None  Antibiotics:  IV Rocephin  Assessment/Plan: Principal Problem:   Sepsis due to Escherichia coli (E. coli) (HCC) Active Problems:   Anemia   Acute renal failure superimposed on stage 3 chronic kidney disease (HCC)   Sickle cell anemia (HCC)   CKD (chronic kidney disease) stage 2, GFR 60-89 ml/min   Sickle cell pain crisis (HCC)   Sickle cell anemia with crisis (HCC)   Sickle cell crisis (HCC)   History of aneurysm   Heart murmur   Anemia of chronic disease   Sarcoidosis   Chronic pain syndrome   Sickle cell anemia with pain (HCC)   Sepsis with acute renal failure without septic shock (HCC)   Chest pain   Abdominal pain  Symptomatic anemia: Today, patient's hemoglobin is 5.5.  Transfuse an additional unit of PRBCs.  Continue to follow closely.  Repeat CBC in a.m.  Sickle cell disease with pain crisis: Pain is mostly resolved.  Continue home medications.  Dilaudid 1 mg IV every 4 hours as needed for severe breakthrough pain Continue hydration Reevaluate pain scale regularly, monitor vital signs closely, and supplemental oxygen as needed  Bacteremia/sepsis: Blood culture and urine cultures positive for E. coli.  Continue IV Rocephin.  Patient has been followed by infectious disease.  Appreciate their input.  Leukocytosis: Continues to improve.  Patient is afebrile.  Continue IV Rocephin.  Repeat CBC in a.m.  Acute kidney injury on chronic kidney disease stage III: Patient renal function appears to be improving.  Patient does not warrant dialysis at this time per nephrology. Nephrology continues to follow, their input is much appreciated.  History of asthma: Continue home  medications  History of sarcoidosis: Stable.  Continue home medications.  He will follow with [facility] pulmonology as an outpatient.   Code Status: Full Code Family Communication: N/A Disposition Plan: Not yet ready for  discharge   Nolon Nations  APRN, MSN, FNP-C Patient Care [facility] [address] [place], [place] [address] [phone]  If 5PM-7AM, please contact night-coverage.  04/10/2020, 4:04 PM  LOS: 3 days

## 2020-04-10 NOTE — Care Management Important Message (Signed)
Important Message  Patient Details IM Letter presented to the [facility] Name: Shawn Adams MRN: [id number] Date of Birth: 10-01-1975   Medicare Important Message Given:  Yes     Caren Macadam 04/10/2020, 4:19 PM

## 2020-04-10 NOTE — Progress Notes (Addendum)
Patient ID: Shawn Adams, male   DOB: [birth year], 44 y.o.   MRN: [id number] [facility] Progress Note   Assessment/ Plan:   1. Acute kidney Injury on chronic kidney disease stage III: Nonoliguric and likely from ATN associated with sepsis with preceding volume contraction from poor oral intake/COX-2 inhibitor use. Renal function improving based on the improvement of creatinine noted overnight; he does not have any indications for dialysis and we will continue to follow renal function with labs and ongoing management of underlying sepsis. 2. E. coli urinary tract infection with sepsis: With improving leukocytosis and remains hemodynamically stable on ceftriaxone. 3. Sarcoidosis: Remains clinically stable on hydroxychloroquine; he informs me that he does not take prednisone as an outpatient. 4. Sickle cell anemia: Relatively stable hemoglobin and hematocrit on Voxelotor and transfusion triggers per primary service. 5. Anion gap metabolic acidosis: Secondary to acute kidney injury and possibly component of renal tubular acidosis with sickle cell disease, I will increase the dose of his oral sodium bicarbonate supplementation.  Subjective:   Reports to be feeling slightly better and inquires about his hemoglobin and hematocrit   Objective:   BP 120/71   Pulse 75   Temp 98.6 F (37 C) (Oral)   Resp 14   Ht 6' (1.829 m)   Wt 56.6 kg   SpO2 99%   BMI 16.92 kg/m   Intake/Output Summary (Last 24 hours) at 04/10/2020 1243 Last data filed at 04/10/2020 1133 Gross per 24 hour  Intake 4454.83 ml  Output 1750 ml  Net 2704.83 ml   Weight change: -1.596 kg  Physical Exam: Gen: Frail man who appears to be chronically ill resting comfortably in bed CVS: Pulse regular rhythm, normal rate, S1 and S2 with ejection systolic murmur Resp: Anteriorly clear to auscultation, no rales/rhonchi Abd: Soft, flat, nontender Ext: No lower extremity edema  Imaging: US RENAL  Result Date:  04/08/2020 CLINICAL DATA:  Acute renal injury, stage III renal insufficiency EXAM: RENAL / URINARY TRACT ULTRASOUND COMPLETE COMPARISON:  04/28/2016 FINDINGS: Right Kidney: Renal measurements: 13.7 x 5.4 x 5.9 cm = volume: 231 mL. Renal cortical echogenicity is diffusely increased, progressive since prior examination. Renal cortical thickness, however, has been preserved. There is no hydronephrosis. No intrarenal masses or calcifications are seen. Left Kidney: Renal measurements: 11.9 x 6.3 x 6.0 cm = volume: 239 mL. There is limited evaluation of the upper pole of the left kidney due to overlying osseous structure. Renal cortical echogenicity is diffusely increased. This is progressive since prior examination. Renal cortical thickness, however, has been preserved. No hydronephrosis. No intrarenal masses or calcifications. Bladder: The bladder is distended and a small amount of layering debris is seen within the bladder lumen. Other: None. IMPRESSION: Diffusely increased renal cortical echogenicity, in keeping with underlying medical renal disease, progressive since prior examination. Layering debris within the bladder lumen. Electronically Signed   By: Helyn Numbers MD   On: 04/08/2020 21:40    Labs: BMET Recent Labs  Lab 04/07/20 1443 04/07/20 1841 04/08/20 0615 04/08/20 1456 04/09/20 0518 04/10/20 0459  NA 130*  --  128* 131* 131* 133*  K 4.2  --  4.1 4.4 4.2 4.1  CL 105  --  105 104 107 111  CO2 11*  --  10* 12* 11* 12*  GLUCOSE 105*  --  104* 98 100* 112*  BUN 104*  --  114* 113* 109* 110*  CREATININE 7.06* 7.09* 7.07* 7.43* 6.98* 6.16*  CALCIUM 8.8*  --  8.4* 8.7*  8.7* 9.2   CBC Recent Labs  Lab 04/08/20 0615 04/08/20 1456 04/09/20 0518 04/10/20 0459  WBC 23.8* 21.2* 20.4* 16.6*  HGB 5.4* 5.9* 6.1* 5.5*  HCT 14.7* 16.2* 16.9* 15.4*  MCV 90.2 89.5 89.4 89.0  PLT 217 217 238 246    Medications:    . vitamin C  1,000 mg Oral Daily  . fluticasone furoate-vilanterol  1 puff  Inhalation Daily  . folic acid  1 mg Oral Daily  . hydroxychloroquine  200 mg Oral Daily  . melatonin  10 mg Oral QHS  . pantoprazole  40 mg Oral Daily   Or  . pantoprazole (PROTONIX) IV  40 mg Intravenous Daily  . senna-docusate  1 tablet Oral BID  . sodium bicarbonate  650 mg Oral Daily  . umeclidinium bromide  1 puff Inhalation Daily  . vitamin B-12  500 mcg Oral Daily  . [START ON 04/24/2020] Vitamin D (Ergocalciferol)  50,000 Units Oral Q30 days  . voxelotor  1,500 mg Oral Daily   Zetta Bills, MD 04/10/2020, 12:43 PM

## 2020-04-11 DIAGNOSIS — N183 Chronic kidney disease, stage 3 unspecified: Secondary | ICD-10-CM

## 2020-04-11 DIAGNOSIS — N170 Acute kidney failure with tubular necrosis: Secondary | ICD-10-CM

## 2020-04-11 LAB — COMPREHENSIVE METABOLIC PANEL
ALT: 23 U/L (ref 0–44)
AST: 36 U/L (ref 15–41)
Albumin: 3.1 g/dL — ABNORMAL LOW (ref 3.5–5.0)
Alkaline Phosphatase: 117 U/L (ref 38–126)
Anion gap: 12 (ref 5–15)
BUN: 89 mg/dL — ABNORMAL HIGH (ref 6–20)
CO2: 12 mmol/L — ABNORMAL LOW (ref 22–32)
Calcium: 9 mg/dL (ref 8.9–10.3)
Chloride: 109 mmol/L (ref 98–111)
Creatinine, Ser: 5.14 mg/dL — ABNORMAL HIGH (ref 0.61–1.24)
GFR calc Af Amer: 15 mL/min — ABNORMAL LOW (ref >60)
GFR calc non Af Amer: 13 mL/min — ABNORMAL LOW (ref >60)
Glucose, Bld: 95 mg/dL (ref 70–99)
Potassium: 4.5 mmol/L (ref 3.5–5.1)
Sodium: 133 mmol/L — ABNORMAL LOW (ref 135–145)
Total Bilirubin: 1.3 mg/dL — ABNORMAL HIGH (ref 0.3–1.2)
Total Protein: 7.4 g/dL (ref 6.5–8.1)

## 2020-04-11 LAB — CBC
HCT: 19.2 % — ABNORMAL LOW (ref 39.0–52.0)
Hemoglobin: 6.4 g/dL — CL (ref 13.0–17.0)
MCH: 29.6 pg (ref 26.0–34.0)
MCHC: 33.3 g/dL (ref 30.0–36.0)
MCV: 88.9 fL (ref 80.0–100.0)
Platelets: 304 10*3/uL (ref 150–400)
RBC: 2.16 MIL/uL — ABNORMAL LOW (ref 4.22–5.81)
RDW: 24 % — ABNORMAL HIGH (ref 11.5–15.5)
WBC: 14.6 10*3/uL — ABNORMAL HIGH (ref 4.0–10.5)
nRBC: 0.6 % — ABNORMAL HIGH (ref 0.0–0.2)

## 2020-04-11 LAB — CULTURE, BLOOD (ROUTINE X 2): Special Requests: ADEQUATE

## 2020-04-11 NOTE — Progress Notes (Signed)
Subjective: Patient admitted with acute kidney injury on chronic kidney disease stage III, sickle cell disease, complication of sarcoidosis UTI as well as anion gap metabolic acidosis.  He is improving now.  Patient transfused a pack of red blood cells.  Renal function is slowly improving.  At this point pain is only 5 out of 10.  No fever or chills no nausea vomiting or diarrhea.  Objective: Vital signs in last 24 hours: Temp:  [97.9 F (36.6 C)-98.6 F (37 C)] 97.9 F (36.6 C) (07/17 0459) Pulse Rate:  [67-90] 90 (07/17 0459) Resp:  [14-18] 14 (07/17 0459) BP: (111-132)/(59-78) 132/59 (07/17 0459) SpO2:  [99 %-100 %] 100 % (07/17 0459) Weight:  [56.5 kg] 56.5 kg (07/17 0500) Weight change: -0.1 kg Last BM Date: 04/07/20  Intake/Output from previous day: 07/16 0701 - 07/17 0700 In: 2404.9 [I.V.:1664.9; Blood:640; IV Piggyback:100] Out: 1700 [Urine:1700] Intake/Output this shift: Total I/O In: -  Out: 700 [Urine:700]  General appearance: alert, cooperative, appears stated age and no distress Head: Normocephalic, without obvious abnormality, atraumatic Back: symmetric, no curvature. ROM normal. No CVA tenderness. Resp: clear to auscultation bilaterally Chest wall: no tenderness Cardio: regular rate and rhythm, S1, S2 normal, no murmur, click, rub or gallop GI: soft, non-tender; bowel sounds normal; no masses,  no organomegaly Extremities: extremities normal, atraumatic, no cyanosis or edema Pulses: 2+ and symmetric Skin: Skin color, texture, turgor normal. No rashes or lesions Neurologic: Grossly normal  Lab Results: Recent Labs    04/10/20 0459 04/10/20 0459 04/10/20 1431 04/11/20 0540  WBC 16.6*  --   --  14.6*  HGB 5.5*   < > 5.9* 6.4*  HCT 15.4*   < > 17.3* 19.2*  PLT 246  --   --  304   < > = values in this interval not displayed.   BMET Recent Labs    04/10/20 0459 04/11/20 0540  NA 133* 133*  K 4.1 4.5  CL 111 109  CO2 12* 12*  GLUCOSE 112* 95  BUN  110* 89*  CREATININE 6.16* 5.14*  CALCIUM 9.2 9.0    Studies/Results: No results found.  Medications: I have reviewed the patient's current medications.  Assessment/Plan: A 44 year old admitted with sickle cell crisis but also acute kidney injury.  #1 ATN with acute kidney injury on chronic kidney disease stage III: BUN/creatinine is improving.  Currently 89 and 5.14 down from 110 and 6.16 yesterday.  Nephrology is following.  Suspected due to prerenal causes as well as NSAID use.  Continue per nephrology.  #2 sickle cell anemia with crisis: On Dilaudid PCA and doing much better.  No Toradol due to kidney disease.  Monitor pain closely.  #3 leukocytosis: Continue to monitor white count.  #4 sarcoidosis: Stable at baseline.  #5 hyponatremia: Continue to monitor sodium level.  #6 UTI: Continue with antibiotics monitoring.  LOS: 4 days   Saiya Crist,LAWAL 04/11/2020, 10:30 AM

## 2020-04-11 NOTE — Progress Notes (Signed)
Patient ID: Shawn Adams, male   DOB: [birth year], 44 y.o.   MRN: [id number] [facility] Progress Note   Assessment/ Plan:   1. Acute kidney Injury on chronic kidney disease stage III: Nonoliguric and likely from ATN associated with sepsis with preceding volume contraction from poor oral intake/COX-2 inhibitor use. Renal function showing improvement with downtrending creatinine and decent urine output.  No indications for dialysis and may be able to discharge home tomorrow if creatinine continues to trend down with patient follow-up with his PCP. 2. E. coli urinary tract infection with sepsis: With improving leukocytosis and remains hemodynamically stable on ceftriaxone-plans for transitioning to oral antibiotic per ID recommendations noted for tomorrow. 3. Sarcoidosis: Remains clinically stable on hydroxychloroquine. Contrary to prior records, he informs me that he does not take prednisone for sarcoidosis. 4. Sickle cell anemia: Relatively stable hemoglobin and hematocrit on Voxelotor and transfusion triggers per primary service. 5. Anion gap metabolic acidosis: Secondary to acute kidney injury and possibly component of renal tubular acidosis with sickle cell disease, I just increased his oral sodium bicarbonate supplementation yesterday and its impact should be visible on labs tomorrow morning.  Subjective:   Continues to feel better and inquires about going home/discharge.   Objective:   BP (!) 132/59 (BP Location: Left Arm)   Pulse 90   Temp 97.9 F (36.6 C) (Oral)   Resp 14   Ht 6' (1.829 m)   Wt 56.5 kg   SpO2 99%   BMI 16.89 kg/m   Intake/Output Summary (Last 24 hours) at 04/11/2020 1118 Last data filed at 04/11/2020 1004 Gross per 24 hour  Intake 2541.57 ml  Output 2300 ml  Net 241.57 ml   Weight change: -0.1 kg  Physical Exam: Gen: Thin built man who is resting comfortably in bed CVS: Pulse regular rhythm, normal rate, S1 and S2 with ejection systolic  murmur Resp: Anteriorly clear to auscultation, no rales/rhonchi Abd: Soft, flat, nontender Ext: No lower extremity edema  Imaging: No results found.  Labs: BMET Recent Labs  Lab 04/07/20 1443 04/07/20 1841 04/08/20 0615 04/08/20 1456 04/09/20 0518 04/10/20 0459 04/11/20 0540  NA 130*  --  128* 131* 131* 133* 133*  K 4.2  --  4.1 4.4 4.2 4.1 4.5  CL 105  --  105 104 107 111 109  CO2 11*  --  10* 12* 11* 12* 12*  GLUCOSE 105*  --  104* 98 100* 112* 95  BUN 104*  --  114* 113* 109* 110* 89*  CREATININE 7.06* 7.09* 7.07* 7.43* 6.98* 6.16* 5.14*  CALCIUM 8.8*  --  8.4* 8.7* 8.7* 9.2 9.0   CBC Recent Labs  Lab 04/08/20 1456 04/08/20 1456 04/09/20 0518 04/10/20 0459 04/10/20 1431 04/11/20 0540  WBC 21.2*  --  20.4* 16.6*  --  14.6*  HGB 5.9*   < > 6.1* 5.5* 5.9* 6.4*  HCT 16.2*   < > 16.9* 15.4* 17.3* 19.2*  MCV 89.5  --  89.4 89.0  --  88.9  PLT 217  --  238 246  --  304   < > = values in this interval not displayed.    Medications:    . vitamin C  1,000 mg Oral Daily  . fluticasone furoate-vilanterol  1 puff Inhalation Daily  . folic acid  1 mg Oral Daily  . hydroxychloroquine  200 mg Oral Daily  . melatonin  10 mg Oral QHS  . pantoprazole  40 mg Oral Daily   Or  .  pantoprazole (PROTONIX) IV  40 mg Intravenous Daily  . senna-docusate  1 tablet Oral BID  . sodium bicarbonate  650 mg Oral TID  . umeclidinium bromide  1 puff Inhalation Daily  . vitamin B-12  500 mcg Oral Daily  . [START ON 04/24/2020] Vitamin D (Ergocalciferol)  50,000 Units Oral Q30 days  . voxelotor  1,500 mg Oral Daily   Zetta Bills, MD 04/11/2020, 11:18 AM

## 2020-04-12 LAB — CBC
HCT: 18.6 % — ABNORMAL LOW (ref 39.0–52.0)
Hemoglobin: 6.2 g/dL — CL (ref 13.0–17.0)
MCH: 29.5 pg (ref 26.0–34.0)
MCHC: 33.3 g/dL (ref 30.0–36.0)
MCV: 88.6 fL (ref 80.0–100.0)
Platelets: 361 10*3/uL (ref 150–400)
RBC: 2.1 MIL/uL — ABNORMAL LOW (ref 4.22–5.81)
RDW: 24.2 % — ABNORMAL HIGH (ref 11.5–15.5)
WBC: 11.3 10*3/uL — ABNORMAL HIGH (ref 4.0–10.5)
nRBC: 0.9 % — ABNORMAL HIGH (ref 0.0–0.2)

## 2020-04-12 LAB — COMPREHENSIVE METABOLIC PANEL
ALT: 21 U/L (ref 0–44)
AST: 25 U/L (ref 15–41)
Albumin: 3.1 g/dL — ABNORMAL LOW (ref 3.5–5.0)
Alkaline Phosphatase: 115 U/L (ref 38–126)
Anion gap: 10 (ref 5–15)
BUN: 82 mg/dL — ABNORMAL HIGH (ref 6–20)
CO2: 14 mmol/L — ABNORMAL LOW (ref 22–32)
Calcium: 9.3 mg/dL (ref 8.9–10.3)
Chloride: 111 mmol/L (ref 98–111)
Creatinine, Ser: 4.35 mg/dL — ABNORMAL HIGH (ref 0.61–1.24)
GFR calc Af Amer: 18 mL/min — ABNORMAL LOW (ref >60)
GFR calc non Af Amer: 16 mL/min — ABNORMAL LOW (ref >60)
Glucose, Bld: 103 mg/dL — ABNORMAL HIGH (ref 70–99)
Potassium: 4.1 mmol/L (ref 3.5–5.1)
Sodium: 135 mmol/L (ref 135–145)
Total Bilirubin: 1 mg/dL (ref 0.3–1.2)
Total Protein: 7.5 g/dL (ref 6.5–8.1)

## 2020-04-12 MED ORDER — SODIUM BICARBONATE 650 MG PO TABS: 1300.0000 mg | ORAL_TABLET | Freq: Three times a day (TID) | ORAL | Status: DC

## 2020-04-12 MED ORDER — AMOXICILLIN-POT CLAVULANATE 875-125 MG PO TABS
1.0000 | ORAL_TABLET | Freq: Two times a day (BID) | ORAL | 0 refills | Status: DC
Start: 2020-04-12 — End: 2020-06-29

## 2020-04-12 NOTE — Progress Notes (Signed)
Patient ID: Shawn Adams, male   DOB: [birth year], 44 y.o.   MRN: [id number] [facility] Progress Note   Assessment/ Plan:   1. Acute kidney Injury on chronic kidney disease stage III: Nonoliguric and likely from ATN associated with sepsis with preceding volume contraction from poor oral intake/COX-2 inhibitor use. Renal function showing improvement with downtrending creatinine and decent urine output.  No indications for dialysis and he appears to be stable for discharge home with outpatient labs to be followed up by his primary care provider for continued surveillance of renal recovery. 2. E. coli urinary tract infection with sepsis: With improving leukocytosis and remains hemodynamically stable on ceftriaxone-per ID recommendations, may possibly be able to transition to oral antibiotics today. 3. Sarcoidosis: Remains clinically stable on hydroxychloroquine. Contrary to prior records, he informed me that he does not take prednisone for sarcoidosis. 4. Sickle cell anemia: Remains on maintenance therapy with voxelotor and earlier hemoglobin and hematocrit values noted-transfusion triggers per primary service. 5. Anion gap metabolic acidosis: Secondary to acute kidney injury and possibly component of renal tubular acidosis with sickle cell disease, I will increase his sodium bicarbonate dose further.  Renal service will sign off-recommend outpatient follow-up with his primary care provider and labs within the next 2 to 3 days.  I recommend discharging him home on sodium bicarbonate 1300 mg 3 times daily (decrease dose or stop when bicarbonate levels are >20).  Call with questions or concerns.  Subjective:   Denies any acute issues overnight, denies any chest pain or shortness of breath.   Objective:   BP 123/61 (BP Location: Left Arm)   Pulse 74   Temp 98.4 F (36.9 C) (Oral)   Resp 18   Ht 6' (1.829 m)   Wt 56.5 kg   SpO2 99%   BMI 16.89 kg/m   Intake/Output Summary (Last 24  hours) at 04/12/2020 1120 Last data filed at 04/12/2020 0200 Gross per 24 hour  Intake --  Output 1375 ml  Net -1375 ml   Weight change:   Physical Exam: Gen: Thin built man sleeping comfortably in bed, easy to awaken and engage in conversation. CVS: Pulse regular rhythm, normal rate, S1 and S2 with ejection systolic murmur Resp: Anteriorly clear to auscultation, no rales/rhonchi Abd: Soft, flat, nontender Ext: No lower extremity edema  Imaging: No results found.  Labs: BMET Recent Labs  Lab 04/07/20 1443 04/07/20 1443 04/07/20 1841 04/08/20 0615 04/08/20 1456 04/09/20 0518 04/10/20 0459 04/11/20 0540 04/12/20 0641  NA 130*  --   --  128* 131* 131* 133* 133* 135  K 4.2  --   --  4.1 4.4 4.2 4.1 4.5 4.1  CL 105  --   --  105 104 107 111 109 111  CO2 11*  --   --  10* 12* 11* 12* 12* 14*  GLUCOSE 105*  --   --  104* 98 100* 112* 95 103*  BUN 104*  --   --  114* 113* 109* 110* 89* 82*  CREATININE 7.06*   < > 7.09* 7.07* 7.43* 6.98* 6.16* 5.14* 4.35*  CALCIUM 8.8*  --   --  8.4* 8.7* 8.7* 9.2 9.0 9.3   < > = values in this interval not displayed.   CBC Recent Labs  Lab 04/09/20 0518 04/09/20 0518 04/10/20 0459 04/10/20 1431 04/11/20 0540 04/12/20 0641  WBC 20.4*  --  16.6*  --  14.6* 11.3*  HGB 6.1*   < > 5.5* 5.9* 6.4* 6.2*  HCT 16.9*   < > 15.4* 17.3* 19.2* 18.6*  MCV 89.4  --  89.0  --  88.9 88.6  PLT 238  --  246  --  304 361   < > = values in this interval not displayed.    Medications:    . vitamin C  1,000 mg Oral Daily  . fluticasone furoate-vilanterol  1 puff Inhalation Daily  . folic acid  1 mg Oral Daily  . hydroxychloroquine  200 mg Oral Daily  . melatonin  10 mg Oral QHS  . pantoprazole  40 mg Oral Daily   Or  . pantoprazole (PROTONIX) IV  40 mg Intravenous Daily  . senna-docusate  1 tablet Oral BID  . sodium bicarbonate  650 mg Oral TID  . umeclidinium bromide  1 puff Inhalation Daily  . vitamin B-12  500 mcg Oral Daily  . [START ON  04/24/2020] Vitamin D (Ergocalciferol)  50,000 Units Oral Q30 days  . voxelotor  1,500 mg Oral Daily   Zetta Bills, MD 04/12/2020, 11:20 AM

## 2020-04-12 NOTE — Progress Notes (Signed)
CRITICAL VALUE ALERT  Critical Value:  Hgb 6.2  Date & Time Notied:  04/12/20, 1448  Provider Notified: MD Mikeal Hawthorne  Orders Received/Actions taken: Awaiting new orders.

## 2020-04-12 NOTE — Discharge Summary (Signed)
Physician Discharge Summary  Patient ID: Alto Gandolfo MRN: [id number] DOB/AGE: December 07, 1975 44 y.o.  Admit date: 04/07/2020 Discharge date: 04/12/2020  Admission Diagnoses:  Discharge Diagnoses:  Principal Problem:   Sepsis due to Escherichia coli (E. coli) (HCC) Active Problems:   Anemia   Acute renal failure superimposed on stage 3 chronic kidney disease (HCC)   Sickle cell anemia (HCC)   CKD (chronic kidney disease) stage 2, GFR 60-89 ml/min   Sickle cell pain crisis (HCC)   Sickle cell anemia with crisis (HCC)   Sickle cell crisis (HCC)   History of aneurysm   Heart murmur   Anemia of chronic disease   Sarcoidosis   Chronic pain syndrome   Sickle cell anemia with pain (HCC)   Sepsis with acute renal failure without septic shock (HCC)   Chest pain   Abdominal pain   Discharged Condition: good  Hospital Course: Patient admitted with sickle cell painful crisis, sepsis due to E. coli UTI, acute on chronic kidney disease stage II, anemia of chronic disease chronic pain syndrome no abdominal pain.  He was seen and treated with IV antibiotics.  Consultation with with nephrology due to worsening renal function creatinine as high as 6.  He had ATN secondary to prerenal causes and NSAIDs.  Conservative measures IV fluids supportive care.  Renal function has steadily declined.  Creatinine down to around 4 and then less than 4 into the threes.  There is a good trend of improvement.  Nephrology therefore recommended discharge to have outpatient follow-up.  Patient also has sickle cell anemia with acute sickle cell painful crisis treated with IV Dilaudid initially PCA transition to IV Dilaudid pushes as his oral pain medications which were adjusted.  At time of discharge patient was back on his home regiment.  He does have sarcoidosis which also contributed to history morbidity.  At time of discharge he seems to be back to baseline except for his creatinine that is still elevated but trending  downwards.  He will follow up with PCP and nephrologist and continue pain management with home regimen  Consults: nephrology  Significant Diagnostic Studies: labs: Serial CBCs CMP is blood cultures will check  Treatments: IV hydration, antibiotics: ceftriaxone and analgesia: acetaminophen and Dilaudid  Discharge Exam: Blood pressure 123/61, pulse 74, temperature 98.4 F (36.9 C), temperature source Oral, resp. rate 18, height 6' (1.829 m), weight 56.5 kg, SpO2 99 %. General appearance: alert, cooperative, appears stated age and no distress Head: Normocephalic, without obvious abnormality, atraumatic Eyes: conjunctivae/corneas clear. PERRL, EOM's intact. Fundi benign. Back: symmetric, no curvature. ROM normal. No CVA tenderness. Resp: clear to auscultation bilaterally Cardio: regular rate and rhythm, S1, S2 normal, no murmur, click, rub or gallop GI: soft, non-tender; bowel sounds normal; no masses,  no organomegaly Extremities: extremities normal, atraumatic, no cyanosis or edema Pulses: 2+ and symmetric Skin: Skin color, texture, turgor normal. No rashes or lesions Neurologic: Grossly normal  Disposition: Discharge disposition: 01-Home or Self Care       Discharge Instructions    Diet - low sodium heart healthy   Complete by: As directed    Increase activity slowly   Complete by: As directed      Allergies as of 04/12/2020   No Known Allergies     Medication List    STOP taking these medications   meloxicam 15 MG tablet Commonly known as: MOBIC     TAKE these medications   acetaminophen 500 MG tablet Commonly known as: TYLENOL Take 500  mg by mouth every 6 (six) hours as needed for moderate pain or fever (pain).   albuterol 108 (90 Base) MCG/ACT inhaler Commonly known as: VENTOLIN HFA Inhale 2 puffs into the lungs every 4 (four) hours as needed for wheezing or shortness of breath (cough, shortness of breath or wheezing.). What changed: reasons to take this    amoxicillin-clavulanate 875-125 MG tablet Commonly known as: Augmentin Take 1 tablet by mouth 2 (two) times daily. One po bid x 7 days   cyclobenzaprine 10 MG tablet Commonly known as: FLEXERIL Take 1 tablet (10 mg total) by mouth 3 (three) times daily as needed for muscle spasms.   docusate sodium 100 MG capsule Commonly known as: COLACE Take 100 mg by mouth daily as needed for mild constipation.   ergocalciferol 1.25 MG (50000 UT) capsule Commonly known as: VITAMIN D2 Take 50,000 Units by mouth every 30 (thirty) days.   famotidine 10 MG tablet Commonly known as: PEPCID Take 10 mg by mouth daily as needed for heartburn or indigestion.   folic acid 1 MG tablet Commonly known as: FOLVITE Take 1 tablet (1 mg total) by mouth daily.   hydroxychloroquine 200 MG tablet Commonly known as: PLAQUENIL Take 200 mg by mouth daily.   hydroxyurea 500 MG capsule Commonly known as: HYDREA Take 2 capsules (1,000 mg total) by mouth daily. May take with food to minimize GI side effects. What changed:   how much to take  when to take this   Incruse Ellipta 62.5 MCG/INH Aepb Generic drug: umeclidinium bromide Inhale 1 puff into the lungs daily.   Melatonin 10 MG Tabs Take 10 mg by mouth at bedtime.   ondansetron 4 MG tablet Commonly known as: ZOFRAN Take 1 tablet (4 mg total) by mouth every 6 (six) hours. What changed:   when to take this  reasons to take this   Oxbryta 500 MG Tabs tablet Generic drug: voxelotor Take 1,500 mg by mouth.   Oxycodone HCl 20 MG Tabs Take 20 mg by mouth every 4 (four) hours as needed (pain).   predniSONE 10 MG tablet Commonly known as: DELTASONE Take 40 mg by mouth daily.   sodium bicarbonate 650 MG tablet Take 650 mg by mouth daily.   Symbicort 160-4.5 MCG/ACT inhaler Generic drug: budesonide-formoterol Inhale 2 puffs into the lungs 2 (two) times daily.   vitamin B-12 500 MCG tablet Commonly known as: CYANOCOBALAMIN Take 500 mcg by  mouth daily.   vitamin C 1000 MG tablet Take 1,000 mg by mouth daily.        SignedLonia Blood 04/12/2020, 11:55 AM   Time spent 35 minutes

## 2020-04-13 LAB — BPAM RBC
Blood Product Expiration Date: 202108062359
Blood Product Expiration Date: 202108062359
Blood Product Expiration Date: 202108082359
Blood Product Expiration Date: 202108142359
ISSUE DATE / TIME: 202107132154
ISSUE DATE / TIME: 202107140111
ISSUE DATE / TIME: 202107140948
ISSUE DATE / TIME: 202107160902
Unit Type and Rh: 9500
Unit Type and Rh: 9500
Unit Type and Rh: 9500
Unit Type and Rh: 9500

## 2020-04-13 LAB — TYPE AND SCREEN
ABO/RH(D): B POS
Antibody Screen: NEGATIVE
Donor AG Type: NEGATIVE
Donor AG Type: NEGATIVE
Donor AG Type: NEGATIVE
Unit division: 0
Unit division: 0
Unit division: 0
Unit division: 0

## 2020-05-28 ENCOUNTER — Encounter: Payer: Self-pay | Admitting: Nurse Practitioner

## 2020-05-28 ENCOUNTER — Other Ambulatory Visit: Payer: Self-pay

## 2020-05-28 ENCOUNTER — Ambulatory Visit (INDEPENDENT_AMBULATORY_CARE_PROVIDER_SITE_OTHER): Payer: Medicare HMO | Admitting: Nurse Practitioner

## 2020-05-28 VITALS — BP 121/68 | HR 76 | Temp 98.6°F | Resp 16 | Ht 72.0 in | Wt 131.4 lb

## 2020-05-28 DIAGNOSIS — F439 Reaction to severe stress, unspecified: Secondary | ICD-10-CM

## 2020-05-28 DIAGNOSIS — R55 Syncope and collapse: Secondary | ICD-10-CM

## 2020-05-28 DIAGNOSIS — D869 Sarcoidosis, unspecified: Secondary | ICD-10-CM | POA: Diagnosis not present

## 2020-05-28 DIAGNOSIS — D571 Sickle-cell disease without crisis: Secondary | ICD-10-CM

## 2020-05-28 DIAGNOSIS — Z8679 Personal history of other diseases of the circulatory system: Secondary | ICD-10-CM

## 2020-05-28 NOTE — Patient Instructions (Signed)
     Syncope Syncope is when you pass out (faint) for a short time. It is caused by a sudden decrease in blood flow to the brain. Signs that you may be about to pass out include:  Feeling dizzy or light-headed.  Feeling sick to your stomach (nauseous).  Seeing all white or all black.  Having cold, clammy skin. If you pass out, get help right away. Call your local emergency services (911 in the [place] Do not drive yourself to the hospital. Follow these instructions at home: Watch for any changes in your symptoms. Take these actions to stay safe and help with your symptoms: Lifestyle  Do not drive, use machinery, or play sports until your doctor says it is okay.  Do not drink alcohol.  Do not use any products that contain nicotine or tobacco, such as cigarettes and e-cigarettes. If you need help quitting, ask your doctor.  Drink enough fluid to keep your pee (urine) pale yellow. General instructions  Take over-the-counter and prescription medicines only as told by your doctor.  If you are taking blood pressure or heart medicine, sit up and stand up slowly. Spend a few minutes getting ready to sit and then stand. This can help you feel less dizzy.  Have someone stay with you until you feel stable.  If you start to feel like you might pass out, lie down right away and raise (elevate) your feet above the level of your heart. Breathe deeply and steadily. Wait until all of the symptoms are gone.  Keep all follow-up visits as told by your doctor. This is important. Get help right away if:  You have a very bad headache.  You pass out once or more than once.  You have pain in your chest, belly, or back.  You have a very fast or uneven heartbeat (palpitations).  It hurts to breathe.  You are bleeding from your mouth or your bottom (rectum).  You have black or tarry poop (stool).  You have jerky movements that you cannot control (seizure).  You are confused.  You have  trouble walking.  You are very weak.  You have vision problems. These symptoms may be an emergency. Do not wait to see if the symptoms will go away. Get medical help right away. Call your local emergency services (911 in the [place] Do not drive yourself to the hospital. Summary  Syncope is when you pass out (faint) for a short time. It is caused by a sudden decrease in blood flow to the brain.  Signs that you may be about to faint include feeling dizzy, light-headed, or sick to your stomach, seeing all white or all black, or having cold, clammy skin.  If you start to feel like you might pass out, lie down right away and raise (elevate) your feet above the level of your heart. Breathe deeply and steadily. Wait until all of the symptoms are gone. This information is not intended to replace advice given to you by your health care provider. Make sure you discuss any questions you have with your health care provider. Document Revised: 10/25/2017 Document Reviewed: 10/25/2017 [employer] Patient Education  2020 [employer]  

## 2020-05-28 NOTE — Progress Notes (Signed)
[facility] [address] [place], [place]  [address] Phone:  [phone]   Fax:  [phone]   New Patient Office Visit  Subjective:  Patient ID: Shawn Adams, male    DOB: 1976-08-12  Age: 44 y.o. MRN: [id number]  CC:  Chief Complaint  Patient presents with   Follow-up    Pt states he would like to discuss his blackouts.  One day he was driving down the road and he had to shake his self to focus.[id number].    HPI Shawn Adams presents to establish care. He  has a past medical history of Aneurysm (HCC), Heart murmur, Sarcoid, and Sickle cell anemia (HCC).   Syncope Patient complains of near syncope.  Symptoms have stabilized  Patient describes the episode as never actually lost consciousness, had precursor symptoms only, including dizziness, headaches and speech difficulty. Associated symptoms: none. The patient denies abdominal pain, diarrhea, excessive thirst, general feeling of lightheadedness, melena, nausea, tachycardia/palpitations and visual aura. Medications putting patient at risk for syncope: none. Hx of headaches 2-3 yrs ago He admits that this happens when he is alone. He is disable for SCD. He is seen in [facility]. He has history of brain aneurysm . He is seen by neurology but not recently.   Past Medical History:  Diagnosis Date   Aneurysm (HCC)    Pt states happened in 2011, Brain    Heart murmur    Sarcoid    Sickle cell anemia (HCC)     Past Surgical History:  Procedure Laterality Date   LYMPH NODE BIOPSY     NASAL SINUS SURGERY      Family History  Problem Relation Age of Onset   Diabetes Mother    Hypertension Father    Lupus Sister     Social History   Socioeconomic History   Marital status: Single    Spouse name: Not on file   Number of children: Not on file   Years of education: Not on file   Highest education level: Not on file  Occupational History   Not on file  Tobacco Use   Smoking status: Former  Smoker    Types: Cigarettes   Smokeless tobacco: Never Used  Building services engineer Use: Never used  Substance and Sexual Activity   Alcohol use: Not Currently    Alcohol/week: 1.0 standard drink    Types: 1 Cans of beer per week    Comment: occasionally   Drug use: Not Currently    Types: Marijuana   Sexual activity: Yes    Birth control/protection: None  Other Topics Concern   Not on file  Social History Narrative   Not on file   Social Determinants of Health   Financial Resource Strain:    Difficulty of Paying Living Expenses: Not on file  Food Insecurity:    Worried About Programme researcher, broadcasting/film/video in the Last Year: Not on file   [employer] of Food in the Last Year: Not on file  Transportation Needs:    Lack of Transportation (Medical): Not on file   Lack of Transportation (Non-Medical): Not on file  Physical Activity:    Days of Exercise per Week: Not on file   Minutes of Exercise per Session: Not on file  Stress:    Feeling of Stress : Not on file  Social Connections:    Frequency of Communication with Friends and Family: Not on file   Frequency of Social Gatherings with Friends  and Family: Not on file   Attends Religious Services: Not on file   Active Member of Clubs or Organizations: Not on file   Attends Banker Meetings: Not on file   Marital Status: Not on file  Intimate Partner Violence:    Fear of Current or Ex-Partner: Not on file   Emotionally Abused: Not on file   Physically Abused: Not on file   Sexually Abused: Not on file    ROS Review of Systems  Constitutional:       Hydrating well  Eating well  Eyes: Negative for photophobia and visual disturbance.       Opthalmology last year  Respiratory: Negative for chest tightness and shortness of breath.   Cardiovascular: Positive for palpitations. Negative for chest pain.  Neurological: Positive for dizziness, speech difficulty and headaches. Negative for tremors and  seizures.    Objective:   Today's Vitals: BP 121/68 (BP Location: Left Arm, Patient Position: Sitting, Cuff Size: Normal)    Pulse 76    Temp 98.6 F (37 C)    Resp 16    Ht 6' (1.829 m)    Wt 131 lb 6.4 oz (59.6 kg)    SpO2 99%    BMI 17.82 kg/m   Physical Exam Constitutional:      General: He is not in acute distress. HENT:     Head: Normocephalic and atraumatic.     Nose: Nose normal.     Mouth/Throat:     Mouth: Mucous membranes are moist.     Pharynx: Oropharynx is clear.  Eyes:     Pupils: Pupils are equal, round, and reactive to light.  Cardiovascular:     Rate and Rhythm: Normal rate and regular rhythm.     Pulses: Normal pulses.     Heart sounds: Normal heart sounds.  Pulmonary:     Effort: Pulmonary effort is normal.     Breath sounds: Normal breath sounds.  Abdominal:     General: Bowel sounds are normal.     Palpations: Abdomen is soft.  Musculoskeletal:     Cervical back: Normal range of motion and neck supple.     Right lower leg: Edema present.     Left lower leg: Edema present.  Skin:    General: Skin is warm and dry.     Capillary Refill: Capillary refill takes less than 2 seconds.  Neurological:     General: No focal deficit present.     Mental Status: He is alert and oriented to person, place, and time.     Comments: CN 2-12 intact  Psychiatric:        Mood and Affect: Mood normal.        Behavior: Behavior normal.        Thought Content: Thought content normal.        Judgment: Judgment normal.     Assessment & Plan:   Problem List Items Addressed This Visit      Other   Sarcoidosis Follow up with pulmonology as scheduled   History of aneurysm   Hb-SS disease without crisis (HCC) We discussed the need for good hydration, monitoring of hydration status, avoidance of heat, cold, stress, and infection triggers. We discussed the risks and benefits of Hydrea, including bone marrow suppression, the possibility of GI upset, skin ulcers, hair  thinning, and teratogenicity. The patient was reminded of the need to seek medical attention of any symptoms of bleeding, anemia, or infection. Continue folic acid 1  mg daily to prevent aplastic bone marrow crises.     Other Visit Diagnoses    Stress    -  Primary Discussed with patient education provided.    Near syncope     Encouraged follow up with neurology or go to ED if symptoms persist      Outpatient Encounter Medications as of 05/28/2020  Medication Sig   acetaminophen (TYLENOL) 500 MG tablet Take 500 mg by mouth every 6 (six) hours as needed for moderate pain or fever (pain).    albuterol (VENTOLIN HFA) 108 (90 Base) MCG/ACT inhaler Inhale into the lungs.   Ascorbic Acid (VITAMIN C) 1000 MG tablet Take 1,000 mg by mouth daily.   cyclobenzaprine (FLEXERIL) 10 MG tablet Take 1 tablet (10 mg total) by mouth 3 (three) times daily as needed for muscle spasms.   docusate sodium (COLACE) 100 MG capsule Take 100 mg by mouth daily as needed for mild constipation.   ergocalciferol (VITAMIN D2) 1.25 MG (50000 UT) capsule Take 50,000 Units by mouth every 30 (thirty) days.    famotidine (PEPCID) 10 MG tablet Take 10 mg by mouth daily as needed for heartburn or indigestion.   folic acid (FOLVITE) 1 MG tablet Take 1 tablet (1 mg total) by mouth daily.   hydroxychloroquine (PLAQUENIL) 200 MG tablet Take 200 mg by mouth daily.   hydroxyurea (HYDREA) 500 MG capsule Take 2 capsules (1,000 mg total) by mouth daily. May take with food to minimize GI side effects. (Patient taking differently: Take 500 mg by mouth 2 (two) times daily. May take with food to minimize GI side effects.)   INCRUSE ELLIPTA 62.5 MCG/INH AEPB Inhale 1 puff into the lungs daily.   Melatonin 10 MG TABS Take 10 mg by mouth at bedtime.   ondansetron (ZOFRAN) 4 MG tablet Take 1 tablet (4 mg total) by mouth every 6 (six) hours. (Patient taking differently: Take 4 mg by mouth every 8 (eight) hours as needed for nausea or  vomiting. )   OXBRYTA 500 MG TABS tablet Take 1,500 mg by mouth.    Oxycodone HCl 20 MG TABS Take 20 mg by mouth every 4 (four) hours as needed (pain).    sodium bicarbonate 650 MG tablet Take 650 mg by mouth daily.    vitamin B-12 (CYANOCOBALAMIN) 500 MCG tablet Take 500 mcg by mouth daily.   [DISCONTINUED] albuterol (PROVENTIL HFA;VENTOLIN HFA) 108 (90 Base) MCG/ACT inhaler Inhale 2 puffs into the lungs every 4 (four) hours as needed for wheezing or shortness of breath (cough, shortness of breath or wheezing.). (Patient taking differently: Inhale 2 puffs into the lungs every 4 (four) hours as needed for wheezing or shortness of breath. )   amoxicillin-clavulanate (AUGMENTIN) 875-125 MG tablet Take 1 tablet by mouth 2 (two) times daily. One po bid x 7 days (Patient not taking: Reported on 05/28/2020)   budesonide-formoterol (SYMBICORT) 160-4.5 MCG/ACT inhaler Inhale 2 puffs into the lungs 2 (two) times daily.   predniSONE (DELTASONE) 10 MG tablet Take 40 mg by mouth daily. (Patient not taking: Reported on 04/07/2020)   No facility-administered encounter medications on file as of 05/28/2020.    Follow-up: Return in about 6 months (around 11/25/2020).   Barbette Merino, NP

## 2020-06-29 ENCOUNTER — Encounter: Payer: Self-pay | Admitting: Nurse Practitioner

## 2020-06-29 ENCOUNTER — Ambulatory Visit (HOSPITAL_COMMUNITY)
Admission: RE | Admit: 2020-06-29 | Discharge: 2020-06-29 | Disposition: A | Discharge status: Home / Self Care | Payer: Medicare HMO | Source: Ambulatory Visit | Attending: Nurse Practitioner | Admitting: Nurse Practitioner

## 2020-06-29 ENCOUNTER — Other Ambulatory Visit: Payer: Self-pay

## 2020-06-29 ENCOUNTER — Ambulatory Visit (INDEPENDENT_AMBULATORY_CARE_PROVIDER_SITE_OTHER): Payer: Medicare HMO | Admitting: Nurse Practitioner

## 2020-06-29 VITALS — BP 136/71 | HR 98 | Temp 98.1°F | Ht 72.0 in | Wt 132.0 lb

## 2020-06-29 DIAGNOSIS — G8929 Other chronic pain: Secondary | ICD-10-CM

## 2020-06-29 DIAGNOSIS — M546 Pain in thoracic spine: Secondary | ICD-10-CM | POA: Insufficient documentation

## 2020-06-29 NOTE — Progress Notes (Signed)
[facility] Patient [facility] [address] [place], [place]  [address] Phone:  [phone]   Fax:  [phone]   Established Patient Office Visit  Subjective:  Patient ID: Shawn [facility], male    DOB: 03-22-1976  Age: 44 y.o. MRN: [id number]  CC:  Chief Complaint  Patient presents with  . Spasms    having spasms in back onset one month    HPI Shawn Adams presents for back spasms. He  has a past medical history of Aneurysm (HCC), Heart murmur, Sarcoid, and Sickle cell anemia (HCC).   Joint/Muscle Pain Patient complains of myalgias, which have/has been present for 3 months. Pain is located in in his back . The pain is described as intermittent, electric shock pain . Associated symptoms include: crepitation and soreness.Marland KitchenHe complains of right foot numbness. The patient has had no relief from OTC pain medications (oxycodone ) . He has cyclobenzaprine on his list but not using.  Related to injury: no. He was to see neurology for what the thought was seizure activity however due to a mix up his apt was changed to 10/2020. He thinks that he had a back xray in July.      Past Medical History:  Diagnosis Date  . Aneurysm (HCC)    Pt states happened in 2011, Brain   . Heart murmur   . Sarcoid   . Sickle cell anemia (HCC)     Past Surgical History:  Procedure Laterality Date  . LYMPH NODE BIOPSY    . NASAL SINUS SURGERY      Family History  Problem Relation Age of Onset  . Diabetes Mother   . Hypertension Father   . Lupus Sister     Social History   Socioeconomic History  . Marital status: Single    Spouse name: Not on file  . Number of children: Not on file  . Years of education: Not on file  . Highest education level: Not on file  Occupational History  . Not on file  Tobacco Use  . Smoking status: Former Smoker    Types: Cigarettes  . Smokeless tobacco: Never Used  Vaping Use  . Vaping Use: Never used  Substance and Sexual Activity  . Alcohol use: Not  Currently    Alcohol/week: 1.0 standard drink    Types: 1 Cans of beer per week    Comment: occasionally  . Drug use: Not Currently    Types: Marijuana  . Sexual activity: Yes    Birth control/protection: None  Other Topics Concern  . Not on file  Social History Narrative  . Not on file   Social Determinants of Health   Financial Resource Strain:   . Difficulty of Paying Living Expenses: Not on file  Food Insecurity:   . Worried About Programme researcher, broadcasting/film/video in the Last Year: Not on file  . Ran Out of Food in the Last Year: Not on file  Transportation Needs:   . Lack of Transportation (Medical): Not on file  . Lack of Transportation (Non-Medical): Not on file  Physical Activity:   . Days of Exercise per Week: Not on file  . Minutes of Exercise per Session: Not on file  Stress:   . Feeling of Stress : Not on file  Social Connections:   . Frequency of Communication with Friends and Family: Not on file  . Frequency of Social Gatherings with Friends and Family: Not on file  . Attends Religious Services: Not on file  .  Active Member of Clubs or Organizations: Not on file  . Attends Banker Meetings: Not on file  . Marital Status: Not on file  Intimate Partner Violence:   . Fear of Current or Ex-Partner: Not on file  . Emotionally Abused: Not on file  . Physically Abused: Not on file  . Sexually Abused: Not on file    Outpatient Medications Prior to Visit  Medication Sig Dispense Refill  . acetaminophen (TYLENOL) 500 MG tablet Take 500 mg by mouth every 6 (six) hours as needed for moderate pain or fever (pain).     Marland Kitchen albuterol (VENTOLIN HFA) 108 (90 Base) MCG/ACT inhaler Inhale into the lungs.    . Ascorbic Acid (VITAMIN C) 1000 MG tablet Take 1,000 mg by mouth daily.    . cyclobenzaprine (FLEXERIL) 10 MG tablet Take 1 tablet (10 mg total) by mouth 3 (three) times daily as needed for muscle spasms. 15 tablet 0  . docusate sodium (COLACE) 100 MG capsule Take 100 mg  by mouth daily as needed for mild constipation.    . ergocalciferol (VITAMIN D2) 1.25 MG (50000 UT) capsule Take 50,000 Units by mouth every 30 (thirty) days.     . famotidine (PEPCID) 10 MG tablet Take 10 mg by mouth daily as needed for heartburn or indigestion.    . folic acid (FOLVITE) 1 MG tablet Take 1 tablet (1 mg total) by mouth daily. 90 tablet 3  . hydroxychloroquine (PLAQUENIL) 200 MG tablet Take 200 mg by mouth daily.    . hydroxyurea (HYDREA) 500 MG capsule Take 2 capsules (1,000 mg total) by mouth daily. May take with food to minimize GI side effects. (Patient taking differently: Take 500 mg by mouth 2 (two) times daily. May take with food to minimize GI side effects.) 180 capsule 3  . INCRUSE ELLIPTA 62.5 MCG/INH AEPB Inhale 1 puff into the lungs daily.    . Melatonin 10 MG TABS Take 10 mg by mouth at bedtime.    . ondansetron (ZOFRAN) 4 MG tablet Take 1 tablet (4 mg total) by mouth every 6 (six) hours. (Patient taking differently: Take 4 mg by mouth every 8 (eight) hours as needed for nausea or vomiting. ) 12 tablet 0  . OXBRYTA 500 MG TABS tablet Take 1,500 mg by mouth.     . Oxycodone HCl 20 MG TABS Take 20 mg by mouth every 4 (four) hours as needed (pain).     . sodium bicarbonate 650 MG tablet Take 650 mg by mouth daily.     . vitamin B-12 (CYANOCOBALAMIN) 500 MCG tablet Take 500 mcg by mouth daily.    . budesonide-formoterol (SYMBICORT) 160-4.5 MCG/ACT inhaler Inhale 2 puffs into the lungs 2 (two) times daily.    . predniSONE (DELTASONE) 10 MG tablet Take 40 mg by mouth daily.  (Patient not taking: Reported on 06/29/2020)    . amoxicillin-clavulanate (AUGMENTIN) 875-125 MG tablet Take 1 tablet by mouth 2 (two) times daily. One po bid x 7 days (Patient not taking: Reported on 05/28/2020) 14 tablet 0   No facility-administered medications prior to visit.    No Known Allergies  ROS Review of Systems  Constitutional: Negative for chills and fever.  HENT: Negative.   Eyes:  Negative.   Respiratory: Negative for shortness of breath.   Cardiovascular: Positive for leg swelling (Oxybrta may cause swelling in the feet. he has less pain episodes). Negative for chest pain and palpitations.  Gastrointestinal: Negative for nausea.  Endocrine: Negative.  Neurological: Positive for numbness (left arm last night lasting for 30 minutes no CP or nausea.  right foot numbness on the top).      Objective:    Physical Exam Constitutional:      Comments: underweight  HENT:     Head: Normocephalic and atraumatic.  Cardiovascular:     Rate and Rhythm: Normal rate.     Pulses: Normal pulses.  Pulmonary:     Effort: Pulmonary effort is normal.     Breath sounds: Normal breath sounds.  Musculoskeletal:     Cervical back: Normal and normal range of motion.     Thoracic back: Bony tenderness (left middle) present.     Lumbar back: Normal. Negative right straight leg raise test and negative left straight leg raise test.  Skin:    General: Skin is dry.     Capillary Refill: Capillary refill takes less than 2 seconds.     Comments: Dry skin to entire back  Neurological:     General: No focal deficit present.     Mental Status: He is alert and oriented to person, place, and time.     BP 136/71   Pulse 98   Temp 98.1 F (36.7 C) (Temporal)   Ht 6' (1.829 m)   Wt 132 lb (59.9 kg)   SpO2 100%   BMI 17.90 kg/m  Wt Readings from Last 3 Encounters:  06/29/20 132 lb (59.9 kg)  05/28/20 131 lb 6.4 oz (59.6 kg)  04/11/20 124 lb 9 oz (56.5 kg)     Health Maintenance Due  Topic Date Due  . Hepatitis C Screening  Never done  . Meningococcal B Vaccine (1 of 4 - Increased Risk Bexsero 2-dose series) Never done  . COVID-19 Vaccine (1) Never done    There are no preventive care reminders to display for this patient.  No results found for: TSH Lab Results  Component Value Date   WBC 11.3 (H) 04/12/2020   HGB 6.2 (LL) 04/12/2020   HCT 18.6 (L) 04/12/2020   MCV 88.6  04/12/2020   PLT 361 04/12/2020   Lab Results  Component Value Date   NA 135 04/12/2020   K 4.1 04/12/2020   CO2 14 (L) 04/12/2020   GLUCOSE 103 (H) 04/12/2020   BUN 82 (H) 04/12/2020   CREATININE 4.35 (H) 04/12/2020   BILITOT 1.0 04/12/2020   ALKPHOS 115 04/12/2020   AST 25 04/12/2020   ALT 21 04/12/2020   PROT 7.5 04/12/2020   ALBUMIN 3.1 (L) 04/12/2020   CALCIUM 9.3 04/12/2020   ANIONGAP 10 04/12/2020   No results found for: CHOL No results found for: HDL No results found for: LDLCALC No results found for: TRIG No results found for: CHOLHDL No results found for: EQAS3M    Assessment & Plan:   Problem List Items Addressed This Visit    None    Visit Diagnoses    Chronic left-sided thoracic back pain    -  Primary Encouraged use of flexeril qhs and make [place] aware if symptoms  . Pt to start new job soon.    Relevant Orders   DG Lumbar Spine Complete W/Bend      No orders of the defined types were placed in this encounter.   Follow-up: Return for Appointment As Scheduled.    Barbette Merino, NP

## 2020-06-30 ENCOUNTER — Encounter: Payer: Self-pay | Admitting: Nurse Practitioner

## 2020-06-30 ENCOUNTER — Telehealth: Payer: Self-pay

## 2020-06-30 NOTE — Telephone Encounter (Signed)
Patient called saying that he "feels like his hemoglobin is low.  He was seen on 06/28/2020.

## 2020-07-01 ENCOUNTER — Other Ambulatory Visit: Payer: Self-pay | Admitting: Nurse Practitioner

## 2020-07-01 DIAGNOSIS — D571 Sickle-cell disease without crisis: Secondary | ICD-10-CM

## 2020-07-01 NOTE — Telephone Encounter (Signed)
He is treated for his sickle cell at [facility].  However I will be glad to put in orders for him to come and have his blood work checked.  The lab orders have been placed

## 2020-07-01 NOTE — Progress Notes (Signed)
° °  [facility] Patient [facility] [address] [place], [place]  [address] Phone:  [phone]   Fax:  [phone]  Patient has a history of Hb SS disease for which he is being followed at [facility].  However he called the office today because he feels like his hemoglobin is low. Stat CBC orders placed.

## 2020-07-02 NOTE — Telephone Encounter (Signed)
Spoke with patient he went to the ED at [facility] on 07/01/2020.  He said that he is still in pain, and will call us if needed

## 2020-07-02 NOTE — Telephone Encounter (Signed)
Called patient lvm for call back to schedule lab appointment

## 2020-07-10 ENCOUNTER — Other Ambulatory Visit: Payer: Self-pay

## 2020-07-10 ENCOUNTER — Emergency Department (HOSPITAL_COMMUNITY): Payer: Medicare HMO

## 2020-07-10 DIAGNOSIS — Z87891 Personal history of nicotine dependence: Secondary | ICD-10-CM

## 2020-07-10 DIAGNOSIS — R0602 Shortness of breath: Secondary | ICD-10-CM

## 2020-07-10 DIAGNOSIS — Z833 Family history of diabetes mellitus: Secondary | ICD-10-CM

## 2020-07-10 DIAGNOSIS — Z8249 Family history of ischemic heart disease and other diseases of the circulatory system: Secondary | ICD-10-CM

## 2020-07-10 DIAGNOSIS — D5700 Hb-SS disease with crisis, unspecified: Principal | ICD-10-CM | POA: Diagnosis present

## 2020-07-10 DIAGNOSIS — G894 Chronic pain syndrome: Secondary | ICD-10-CM | POA: Diagnosis present

## 2020-07-10 DIAGNOSIS — F1120 Opioid dependence, uncomplicated: Secondary | ICD-10-CM | POA: Diagnosis present

## 2020-07-10 DIAGNOSIS — N179 Acute kidney failure, unspecified: Secondary | ICD-10-CM | POA: Diagnosis present

## 2020-07-10 DIAGNOSIS — D869 Sarcoidosis, unspecified: Secondary | ICD-10-CM | POA: Diagnosis present

## 2020-07-10 DIAGNOSIS — N182 Chronic kidney disease, stage 2 (mild): Secondary | ICD-10-CM | POA: Diagnosis present

## 2020-07-10 DIAGNOSIS — U071 COVID-19: Secondary | ICD-10-CM | POA: Diagnosis present

## 2020-07-10 DIAGNOSIS — D638 Anemia in other chronic diseases classified elsewhere: Secondary | ICD-10-CM | POA: Diagnosis present

## 2020-07-10 DIAGNOSIS — Z79899 Other long term (current) drug therapy: Secondary | ICD-10-CM

## 2020-07-10 NOTE — ED Triage Notes (Addendum)
Patient has a hx of sickle cell. Patient does not think what is going on related to his sickle cell. Patient was tested positive for covid at [facility] on 07/07/2020. Patient is having sob.

## 2020-07-11 ENCOUNTER — Inpatient Hospital Stay (HOSPITAL_COMMUNITY)
Admission: EM | Admit: 2020-07-11 | Discharge: 2020-07-14 | DRG: 811 | Disposition: A | Discharge status: Home / Self Care | Payer: Medicare HMO | Attending: Internal Medicine | Admitting: Internal Medicine

## 2020-07-11 ENCOUNTER — Encounter (HOSPITAL_COMMUNITY): Payer: Self-pay | Admitting: Emergency Medicine

## 2020-07-11 DIAGNOSIS — Z833 Family history of diabetes mellitus: Secondary | ICD-10-CM | POA: Diagnosis not present

## 2020-07-11 DIAGNOSIS — G894 Chronic pain syndrome: Secondary | ICD-10-CM | POA: Diagnosis present

## 2020-07-11 DIAGNOSIS — D5709 Hb-ss disease with crisis with other specified complication: Secondary | ICD-10-CM

## 2020-07-11 DIAGNOSIS — N182 Chronic kidney disease, stage 2 (mild): Secondary | ICD-10-CM | POA: Diagnosis present

## 2020-07-11 DIAGNOSIS — D869 Sarcoidosis, unspecified: Secondary | ICD-10-CM | POA: Diagnosis present

## 2020-07-11 DIAGNOSIS — N179 Acute kidney failure, unspecified: Secondary | ICD-10-CM | POA: Diagnosis present

## 2020-07-11 DIAGNOSIS — R0602 Shortness of breath: Secondary | ICD-10-CM

## 2020-07-11 DIAGNOSIS — Z87891 Personal history of nicotine dependence: Secondary | ICD-10-CM | POA: Diagnosis not present

## 2020-07-11 DIAGNOSIS — F1120 Opioid dependence, uncomplicated: Secondary | ICD-10-CM | POA: Diagnosis present

## 2020-07-11 DIAGNOSIS — D5700 Hb-SS disease with crisis, unspecified: Secondary | ICD-10-CM | POA: Diagnosis present

## 2020-07-11 DIAGNOSIS — Z79899 Other long term (current) drug therapy: Secondary | ICD-10-CM | POA: Diagnosis not present

## 2020-07-11 DIAGNOSIS — U071 COVID-19: Secondary | ICD-10-CM | POA: Diagnosis present

## 2020-07-11 DIAGNOSIS — D860 Sarcoidosis of lung: Secondary | ICD-10-CM | POA: Diagnosis present

## 2020-07-11 DIAGNOSIS — D638 Anemia in other chronic diseases classified elsewhere: Secondary | ICD-10-CM | POA: Diagnosis present

## 2020-07-11 DIAGNOSIS — Z8249 Family history of ischemic heart disease and other diseases of the circulatory system: Secondary | ICD-10-CM | POA: Diagnosis not present

## 2020-07-11 LAB — CBC WITH DIFFERENTIAL/PLATELET
Abs Immature Granulocytes: 0.34 10*3/uL — ABNORMAL HIGH (ref 0.00–0.07)
Basophils Absolute: 0 10*3/uL (ref 0.0–0.1)
Basophils Relative: 1 %
Eosinophils Absolute: 0.1 10*3/uL (ref 0.0–0.5)
Eosinophils Relative: 2 %
HCT: 15.3 % — ABNORMAL LOW (ref 39.0–52.0)
Hemoglobin: 5.6 g/dL — CL (ref 13.0–17.0)
Immature Granulocytes: 6 %
Lymphocytes Relative: 32 %
Lymphs Abs: 1.9 10*3/uL (ref 0.7–4.0)
MCH: 34.1 pg — ABNORMAL HIGH (ref 26.0–34.0)
MCHC: 36.6 g/dL — ABNORMAL HIGH (ref 30.0–36.0)
MCV: 93.3 fL (ref 80.0–100.0)
Monocytes Absolute: 0.7 10*3/uL (ref 0.1–1.0)
Monocytes Relative: 11 %
Neutro Abs: 2.7 10*3/uL (ref 1.7–7.7)
Neutrophils Relative %: 48 %
Platelets: 456 10*3/uL — ABNORMAL HIGH (ref 150–400)
RBC: 1.64 MIL/uL — ABNORMAL LOW (ref 4.22–5.81)
RDW: 25.8 % — ABNORMAL HIGH (ref 11.5–15.5)
WBC: 5.8 10*3/uL (ref 4.0–10.5)
nRBC: 17.6 % — ABNORMAL HIGH (ref 0.0–0.2)

## 2020-07-11 LAB — C-REACTIVE PROTEIN: CRP: 1.6 mg/dL — ABNORMAL HIGH (ref <1.0)

## 2020-07-11 LAB — COMPREHENSIVE METABOLIC PANEL
ALT: 21 U/L (ref 0–44)
AST: 30 U/L (ref 15–41)
Albumin: 3.8 g/dL (ref 3.5–5.0)
Alkaline Phosphatase: 91 U/L (ref 38–126)
Anion gap: 12 (ref 5–15)
BUN: 30 mg/dL — ABNORMAL HIGH (ref 6–20)
CO2: 20 mmol/L — ABNORMAL LOW (ref 22–32)
Calcium: 9.5 mg/dL (ref 8.9–10.3)
Chloride: 112 mmol/L — ABNORMAL HIGH (ref 98–111)
Creatinine, Ser: 2.64 mg/dL — ABNORMAL HIGH (ref 0.61–1.24)
GFR, Estimated: 28 mL/min — ABNORMAL LOW (ref >60)
Glucose, Bld: 117 mg/dL — ABNORMAL HIGH (ref 70–99)
Potassium: 4.2 mmol/L (ref 3.5–5.1)
Sodium: 144 mmol/L (ref 135–145)
Total Bilirubin: 1.2 mg/dL (ref 0.3–1.2)
Total Protein: 7.7 g/dL (ref 6.5–8.1)

## 2020-07-11 LAB — URINALYSIS, ROUTINE W REFLEX MICROSCOPIC
Bilirubin Urine: NEGATIVE
Glucose, UA: NEGATIVE mg/dL
Ketones, ur: NEGATIVE mg/dL
Leukocytes,Ua: NEGATIVE
Nitrite: NEGATIVE
Protein, ur: 30 mg/dL — AB
Specific Gravity, Urine: 1.015 (ref 1.005–1.030)
pH: 6.5 (ref 5.0–8.0)

## 2020-07-11 LAB — RETICULOCYTES
Immature Retic Fract: 21.7 % — ABNORMAL HIGH (ref 2.3–15.9)
RBC.: 1.59 MIL/uL — ABNORMAL LOW (ref 4.22–5.81)
Retic Count, Absolute: 193.2 10*3/uL — ABNORMAL HIGH (ref 19.0–186.0)
Retic Ct Pct: 12.2 % — ABNORMAL HIGH (ref 0.4–3.1)

## 2020-07-11 LAB — PREPARE RBC (CROSSMATCH)

## 2020-07-11 LAB — FERRITIN: Ferritin: 2757 ng/mL — ABNORMAL HIGH (ref 24–336)

## 2020-07-11 LAB — TROPONIN I (HIGH SENSITIVITY)
Troponin I (High Sensitivity): 4 ng/L (ref <18)
Troponin I (High Sensitivity): 6 ng/L (ref <18)

## 2020-07-11 LAB — TRIGLYCERIDES: Triglycerides: 182 mg/dL — ABNORMAL HIGH (ref <150)

## 2020-07-11 LAB — D-DIMER, QUANTITATIVE: D-Dimer, Quant: 2.49 ug/mL-FEU — ABNORMAL HIGH (ref 0.00–0.50)

## 2020-07-11 LAB — URINALYSIS, MICROSCOPIC (REFLEX)
Bacteria, UA: NONE SEEN
Squamous Epithelial / HPF: NONE SEEN (ref 0–5)

## 2020-07-11 LAB — PROCALCITONIN: Procalcitonin: 0.15 ng/mL

## 2020-07-11 LAB — LACTIC ACID, PLASMA: Lactic Acid, Venous: 1.3 mmol/L (ref 0.5–1.9)

## 2020-07-11 LAB — HEMOGLOBIN AND HEMATOCRIT, BLOOD
HCT: 21.3 % — ABNORMAL LOW (ref 39.0–52.0)
Hemoglobin: 7.4 g/dL — ABNORMAL LOW (ref 13.0–17.0)

## 2020-07-11 LAB — FIBRINOGEN: Fibrinogen: 423 mg/dL (ref 210–475)

## 2020-07-11 LAB — LACTATE DEHYDROGENASE: LDH: 342 U/L — ABNORMAL HIGH (ref 98–192)

## 2020-07-11 MED ORDER — SENNOSIDES-DOCUSATE SODIUM 8.6-50 MG PO TABS
1.0000 | ORAL_TABLET | Freq: Two times a day (BID) | ORAL | Status: DC
  Administered 2020-07-11 – 2020-07-14 (×7): 1 via ORAL
  Filled 2020-07-11 (×7): qty 1

## 2020-07-11 MED ORDER — UMECLIDINIUM BROMIDE 62.5 MCG/INH IN AEPB
1.0000 | INHALATION_SPRAY | Freq: Every day | RESPIRATORY_TRACT | Status: DC
  Administered 2020-07-11 – 2020-07-14 (×4): 1 via RESPIRATORY_TRACT
  Filled 2020-07-11: qty 7

## 2020-07-11 MED ORDER — FENTANYL CITRATE (PF) 100 MCG/2ML IJ SOLN
75.0000 ug | Freq: Once | INTRAMUSCULAR | Status: AC
  Administered 2020-07-11: 75 ug via INTRAVENOUS
  Filled 2020-07-11: qty 2

## 2020-07-11 MED ORDER — SODIUM CHLORIDE 0.45 % IV SOLN: INTRAVENOUS | Status: DC

## 2020-07-11 MED ORDER — MELATONIN 5 MG PO TABS
10.0000 mg | ORAL_TABLET | Freq: Every day | ORAL | Status: DC
  Administered 2020-07-11 – 2020-07-13 (×3): 10 mg via ORAL
  Filled 2020-07-11 (×3): qty 2

## 2020-07-11 MED ORDER — SODIUM CHLORIDE 0.9% FLUSH: 9.0000 mL | INTRAVENOUS | Status: DC | PRN

## 2020-07-11 MED ORDER — HEPARIN SODIUM (PORCINE) 5000 UNIT/ML IJ SOLN
5000.0000 [IU] | Freq: Three times a day (TID) | INTRAMUSCULAR | Status: DC
  Administered 2020-07-11 – 2020-07-14 (×10): 5000 [IU] via SUBCUTANEOUS
  Filled 2020-07-11 (×9): qty 1

## 2020-07-11 MED ORDER — DOCUSATE SODIUM 100 MG PO CAPS: 100.0000 mg | ORAL_CAPSULE | Freq: Every day | ORAL | Status: DC | PRN

## 2020-07-11 MED ORDER — ALBUTEROL SULFATE HFA 108 (90 BASE) MCG/ACT IN AERS: 2.0000 | INHALATION_SPRAY | RESPIRATORY_TRACT | Status: DC | PRN

## 2020-07-11 MED ORDER — ASCORBIC ACID 500 MG PO TABS
1000.0000 mg | ORAL_TABLET | Freq: Every day | ORAL | Status: DC
  Administered 2020-07-11 – 2020-07-14 (×4): 1000 mg via ORAL
  Filled 2020-07-11 (×4): qty 2

## 2020-07-11 MED ORDER — VITAMIN B-12 1000 MCG PO TABS
500.0000 ug | ORAL_TABLET | Freq: Every day | ORAL | Status: DC
  Administered 2020-07-11 – 2020-07-14 (×3): 500 ug via ORAL
  Filled 2020-07-11 (×5): qty 1

## 2020-07-11 MED ORDER — MOMETASONE FURO-FORMOTEROL FUM 200-5 MCG/ACT IN AERO
2.0000 | INHALATION_SPRAY | Freq: Two times a day (BID) | RESPIRATORY_TRACT | Status: DC
  Administered 2020-07-11 – 2020-07-14 (×7): 2 via RESPIRATORY_TRACT
  Filled 2020-07-11: qty 8.8

## 2020-07-11 MED ORDER — NALOXONE HCL 0.4 MG/ML IJ SOLN: 0.4000 mg | INTRAMUSCULAR | Status: DC | PRN

## 2020-07-11 MED ORDER — POLYETHYLENE GLYCOL 3350 17 G PO PACK: 17.0000 g | PACK | Freq: Every day | ORAL | Status: DC | PRN

## 2020-07-11 MED ORDER — ONDANSETRON HCL 4 MG/2ML IJ SOLN
4.0000 mg | Freq: Four times a day (QID) | INTRAMUSCULAR | Status: DC | PRN
  Administered 2020-07-11: 4 mg via INTRAVENOUS
  Filled 2020-07-11: qty 2

## 2020-07-11 MED ORDER — FAMOTIDINE 20 MG PO TABS: 10.0000 mg | ORAL_TABLET | Freq: Every day | ORAL | Status: DC | PRN

## 2020-07-11 MED ORDER — SODIUM CHLORIDE 0.9 % IV SOLN
10.0000 mL/h | Freq: Once | INTRAVENOUS | Status: AC
  Administered 2020-07-11: 10 mL/h via INTRAVENOUS

## 2020-07-11 MED ORDER — HYDROXYCHLOROQUINE SULFATE 200 MG PO TABS
200.0000 mg | ORAL_TABLET | Freq: Every day | ORAL | Status: DC
  Administered 2020-07-11 – 2020-07-14 (×4): 200 mg via ORAL
  Filled 2020-07-11 (×4): qty 1

## 2020-07-11 MED ORDER — SODIUM BICARBONATE 650 MG PO TABS
1300.0000 mg | ORAL_TABLET | Freq: Every day | ORAL | Status: DC
  Administered 2020-07-11 – 2020-07-14 (×4): 1300 mg via ORAL
  Filled 2020-07-11 (×4): qty 2

## 2020-07-11 MED ORDER — VOXELOTOR 500 MG PO TABS: 1500.0000 mg | ORAL_TABLET | Freq: Every day | ORAL | Status: DC

## 2020-07-11 MED ORDER — HYDROMORPHONE 1 MG/ML IV SOLN
INTRAVENOUS | Status: DC
  Administered 2020-07-11: 0.3 mg via INTRAVENOUS
  Administered 2020-07-11: 0.5 mg via INTRAVENOUS
  Administered 2020-07-11: 30 mg via INTRAVENOUS
  Administered 2020-07-11: 0.6 mg via INTRAVENOUS
  Administered 2020-07-12: 0.9 mg via INTRAVENOUS
  Administered 2020-07-12: 1.5 mg via INTRAVENOUS
  Administered 2020-07-12: 0.6 mg via INTRAVENOUS
  Administered 2020-07-12: 1.2 mg via INTRAVENOUS
  Administered 2020-07-13 (×2): 0.6 mg via INTRAVENOUS
  Administered 2020-07-13: 0.3 mg via INTRAVENOUS
  Administered 2020-07-13: 0.6 mg via INTRAVENOUS
  Administered 2020-07-13 – 2020-07-14 (×3): 0 mg via INTRAVENOUS
  Administered 2020-07-14: 0.9 mg via INTRAVENOUS
  Filled 2020-07-11 (×2): qty 30

## 2020-07-11 MED ORDER — FOLIC ACID 1 MG PO TABS
1.0000 mg | ORAL_TABLET | Freq: Every day | ORAL | Status: DC
  Administered 2020-07-11 – 2020-07-14 (×4): 1 mg via ORAL
  Filled 2020-07-11 (×4): qty 1

## 2020-07-11 NOTE — ED Provider Notes (Signed)
[facility]-EMERGENCY DEPT Provider Note   CSN: [id number] Arrival date & time: 07/10/20  2303     History Chief Complaint  Patient presents with  . Shortness of Breath  . Covid Positive    Shawn Adams is a 44 y.o. male with a history of Hb-SS sickle cell disease, sarcoidosis, sepsis due to E. coli UTI, acute on chronic kidney disease, anemia of chronic disease, chronic pain syndrome who presents to the emergency department with a chief complaint of shortness of breath.  The patient reports worsening fatigue, shortness of breath, malaise, cough, congestion, headaches, sore throat, fever on 9/30.  He thought his symptoms might be related to asthma or sarcoidosis.  However, he tested positive for COVID-19 on 10/12 at [facility] after his shortness of breath continue to gradually worsen since onset.  He is also been having intermittent aching, dull pain in the center of his chest for several days.  However, 2 days ago the pain became constant.  It is not pleuritic.  No other known aggravating or alleviating factors.  He is also noticed that the white part of his eyes appears more pale.  He reports that his baseline hemoglobin is 6-7.   He denies loss of sense of taste or smell, back pain, neck pain, rash.  He has not had fevers or chills for the last few days.  Over the summer, he had sepsis secondary to an E. coli UTI.  No history of PE or DVT.  The history is provided by the patient and medical records. No language interpreter was used.       Past Medical History:  Diagnosis Date  . Aneurysm (HCC)    Pt states happened in 2011, Brain   . Heart murmur   . Sarcoid   . Sickle cell anemia [facility])     Patient Active Problem List   Diagnosis Date Noted  . Chest pain   . Abdominal pain   . Sepsis due to Escherichia coli (E. coli) (HCC) 04/09/2020  . Sepsis with acute renal failure without septic shock (HCC)   . Sickle cell anemia with pain (HCC) 04/07/2020  .  Chronic pain syndrome   . Sarcoidosis 07/06/2018  . Persistent asthma without complication 07/06/2018  . Headache 05/03/2016  . Anemia of chronic disease 05/03/2016  . Headache disorder   . Vitamin D deficiency 02/09/2016  . Heart murmur 02/09/2016  . Recurrent occipital headache 10/15/2015  . History of aneurysm 10/13/2015  . Left hip pain 07/30/2015  . Left ankle pain 07/08/2015  . Sickle cell crisis (HCC) 03/08/2015  . 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 Sickle cell anemia with crisis (HCC) 12/07/2014  . Sickle cell anemia (HCC) 06/20/2014  . CKD (chronic kidney disease) stage 2, GFR 60-89 ml/min 06/20/2014  . Leukocytosis 06/20/2014  . Sickle cell pain crisis (HCC) 06/20/2014  . Hypercalcemia 06/20/2014  . Anemia 05/31/2014  . Acute renal failure superimposed on stage 3 chronic kidney disease (HCC) 05/31/2014    Past Surgical History:  Procedure Laterality Date  . LYMPH NODE BIOPSY    . NASAL SINUS SURGERY         Family History  Problem Relation Age of Onset  . Diabetes Mother   . Hypertension Father   . Lupus Sister     Social History   Tobacco Use  . Smoking status: Former Smoker    Types: Cigarettes  . Smokeless tobacco: Never Used  Vaping Use  . Vaping Use: Never  used  Substance Use Topics  . Alcohol use: Not Currently    Alcohol/week: 1.0 standard drink    Types: 1 Cans of beer per week    Comment: occasionally  . Drug use: Not Currently    Types: Marijuana    Home Medications Prior to Admission medications   Medication Sig Start Date End Date Taking? Authorizing Provider  acetaminophen (TYLENOL) 500 MG tablet Take 500 mg by mouth every 6 (six) hours as needed for moderate pain or fever (pain).    Yes [provider]  albuterol (VENTOLIN HFA) 108 (90 Base) MCG/ACT inhaler Inhale into the lungs. 01/24/20 01/23/21 Yes [provider]  Ascorbic Acid (VITAMIN C) 1000 MG tablet Take 1,000 mg by mouth daily.   Yes [provider]  budesonide-formoterol (SYMBICORT) 160-4.5 MCG/ACT inhaler Inhale 2 puffs into the lungs 2 (two) times daily. 05/10/18 07/11/20 Yes [provider]  cyclobenzaprine (FLEXERIL) 10 MG tablet Take 1 tablet (10 mg total) by mouth 3 (three) times daily as needed for muscle spasms. 04/09/18  Yes Street, [place], PA-C  docusate sodium (COLACE) 100 MG capsule Take 100 mg by mouth daily as needed for mild constipation.   Yes [provider]  ergocalciferol (VITAMIN D2) 1.25 MG (50000 UT) capsule Take 50,000 Units by mouth every 30 (thirty) days.  10/24/19  Yes [provider]  famotidine (PEPCID) 10 MG tablet Take 10 mg by mouth daily as needed for heartburn or indigestion.   Yes [provider]  folic acid (FOLVITE) 1 MG tablet Take 1 tablet (1 mg total) by mouth daily. 02/09/16  Yes Quentin Angst, MD  hydroxychloroquine (PLAQUENIL) 200 MG tablet Take 200 mg by mouth daily. 01/13/20  Yes [provider]  hydroxyurea (HYDREA) 500 MG capsule Take 2 capsules (1,000 mg total) by mouth daily. May take with food to minimize GI side effects. 02/09/16  Yes [name], [name], MD  INCRUSE ELLIPTA 62.5 MCG/INH AEPB Inhale 1 puff into the lungs daily. 03/17/20  Yes [provider]  levofloxacin (LEVAQUIN) 750 MG tablet Take 750 mg by mouth daily.  07/07/20  Yes [provider]  Melatonin 10 MG TABS Take 10 mg by mouth at bedtime.   Yes [provider]  ondansetron (ZOFRAN) 4 MG tablet Take 1 tablet (4 mg total) by mouth every 6 (six) hours. Patient taking differently: Take 4 mg by mouth every 8 (eight) hours as needed for nausea or vomiting.  12/21/15  Yes Hedges, Tinnie Gens, PA-C  OXBRYTA 500 MG TABS tablet Take 1,500 mg by mouth.  03/03/20  Yes [provider]  Oxycodone HCl 20 MG TABS Take 20 mg by mouth every 4 (four) hours as needed (pain).    Yes [provider]  sodium bicarbonate 650 MG tablet Take 1,300 mg by  mouth daily.  10/14/19  Yes [provider]  vitamin B-12 (CYANOCOBALAMIN) 500 MCG tablet Take 500 mcg by mouth daily.   Yes [provider]  predniSONE (DELTASONE) 10 MG tablet Take 40 mg by mouth daily.  Patient not taking: Reported on 06/29/2020 04/09/19   [provider]    Allergies    Patient has no known allergies.  Review of Systems   Review of Systems  Constitutional: Positive for fatigue. Negative for appetite change, chills and fever.  HENT: Positive for congestion. Negative for ear pain, sore throat and voice change.   Eyes: Negative for visual disturbance.  Respiratory: Positive for cough and shortness of breath. Negative  for wheezing.   Cardiovascular: Positive for chest pain. Negative for palpitations and leg swelling.  Gastrointestinal: Negative for abdominal pain, blood in stool, diarrhea, nausea and vomiting.  Genitourinary: Positive for flank pain. Negative for dysuria.  Musculoskeletal: Negative for back pain, gait problem, joint swelling, neck pain and neck stiffness.  Skin: Negative for rash and wound.  Allergic/Immunologic: Negative for immunocompromised state.  Neurological: Negative for dizziness, seizures, syncope, weakness, numbness and headaches.  Psychiatric/Behavioral: Negative for confusion.    Physical Exam Updated Vital Signs BP 119/82   Pulse 71   Temp 97.8 F (36.6 C) (Oral)   Resp 20   Ht 6' (1.829 m)   Wt 59.9 kg   SpO2 100%   BMI 17.90 kg/m   Physical Exam Vitals and nursing note reviewed.  Constitutional:      Appearance: He is well-developed.     Comments: No acute distress.  Thin male.  HENT:     Head: Normocephalic.  Eyes:     Conjunctiva/sclera: Conjunctivae normal.     Comments: Pale sclera.  Cardiovascular:     Rate and Rhythm: Normal rate and regular rhythm.     Heart sounds: No murmur heard.   Pulmonary:     Effort: Pulmonary effort is normal. No respiratory distress.     Breath sounds: No  stridor. No wheezing, rhonchi or rales.     Comments: Lungs are clear to auscultation bilaterally.  No increased work of breathing.  No adventitious breath sounds.  No reproducible pain to the chest wall. Chest:     Chest wall: No tenderness.  Abdominal:     General: There is no distension.     Palpations: Abdomen is soft.     Comments: CVA tenderness bilaterally.  Abdomen is soft, nondistended.  Musculoskeletal:     Cervical back: Neck supple.     Right lower leg: No edema.     Left lower leg: No edema.  Skin:    General: Skin is warm and dry.  Neurological:     Mental Status: He is alert.  Psychiatric:        Behavior: Behavior normal.     ED Results / Procedures / Treatments   Labs (all labs ordered are listed, but only abnormal results are displayed) Labs Reviewed  COMPREHENSIVE METABOLIC PANEL - Abnormal; Notable for the following components:      Result Value   Chloride 112 (*)    CO2 20 (*)    Glucose, Bld 117 (*)    BUN 30 (*)    Creatinine, Ser 2.64 (*)    GFR, Estimated 28 (*)    All other components within normal limits  CBC WITH DIFFERENTIAL/PLATELET - Abnormal; Notable for the following components:   RBC 1.64 (*)    Hemoglobin 5.6 (*)    HCT 15.3 (*)    MCH 34.1 (*)    MCHC 36.6 (*)    RDW 25.8 (*)    Platelets 456 (*)    nRBC 17.6 (*)    Abs Immature Granulocytes 0.34 (*)    All other components within normal limits  RETICULOCYTES - Abnormal; Notable for the following components:   Retic Ct Pct 12.2 (*)    RBC. 1.59 (*)    Retic Count, Absolute 193.2 (*)    Immature Retic Fract 21.7 (*)    All other components within normal limits  LACTATE DEHYDROGENASE - Abnormal; Notable for the following components:   LDH 342 (*)    All other  components within normal limits  TRIGLYCERIDES - Abnormal; Notable for the following components:   Triglycerides 182 (*)    All other components within normal limits  CULTURE, BLOOD (ROUTINE X 2)  CULTURE, BLOOD (ROUTINE  X 2)  LACTIC ACID, PLASMA  URINALYSIS, ROUTINE W REFLEX MICROSCOPIC  LACTIC ACID, PLASMA  D-DIMER, QUANTITATIVE (NOT AT [facility])  PROCALCITONIN  FERRITIN  FIBRINOGEN  C-REACTIVE PROTEIN  PREPARE RBC (CROSSMATCH)  TYPE AND SCREEN  TROPONIN I (HIGH SENSITIVITY)  TROPONIN I (HIGH SENSITIVITY)    EKG EKG Interpretation  Date/Time:  Friday July 10 2020 23:22:15 EDT Ventricular Rate:  73 PR Interval:    QRS Duration: 114 QT Interval:  409 QTC Calculation: 451 R Axis:   2 Text Interpretation: Sinus rhythm Left ventricular hypertrophy Lateral infarct, recent Minimal ST elevation, inferior leads Baseline wander in lead(s) V1 No significant change was found Confirmed by Paula LibraMolpus, John ([id number]) on 07/11/2020 1:17:54 AM   Radiology DG Chest Port 1 View  Result Date: 07/11/2020 CLINICAL DATA:  Sickle cell disease.  COVID-19. EXAM: PORTABLE CHEST 1 VIEW COMPARISON:  04/07/2020 FINDINGS: There is mild cardiomegaly with enlargement of the pulmonary arteries, unchanged. No focal airspace consolidation or pulmonary edema. Pleural spaces are normal. IMPRESSION: Unchanged mild cardiomegaly without focal airspace disease. Electronically Signed   By: Deatra RobinsonKevin  Herman M.D.   On: 07/11/2020 00:28    Procedures Procedures (including critical care time)  Medications Ordered in ED Medications  0.9 %  sodium chloride infusion (10 mL/hr Intravenous New Bag/Given 07/11/20 0324)  fentaNYL (SUBLIMAZE) injection 75 mcg (75 mcg Intravenous Given 07/11/20 0309)    ED Course  I have reviewed the triage vital signs and the nursing notes.  Pertinent labs & imaging results that were available during my care of the patient were reviewed by me and considered in my medical decision making (see chart for details).    MDM Rules/Calculators/A&P                          44 year old male with a history of Hb-SS sickle cell disease, sarcoidosis, sepsis due to E. coli UTI, acute on chronic kidney disease, anemia of  chronic disease, chronic pain syndrome who was recently diagnosed with COVID-19 on 10/12.  He has also been having 2 weeks of worsening shortness of breath, chest pain, fatigue, malaise, nasal congestion.  He previously had fever and chills, which have resolved.  Blood pressure is normal.  Afebrile.  No tachycardia or tachypnea.  He has 98% on room air.  However, he continues to endorse shortness of breath.  He was placed on 2 L nasal cannula for comfort.  He has had no documented hypoxia in the ER.  Labs and imaging have been reviewed and interpreted by me.  The patient was discussed with Dr. Read DriversMolpus, attending physician.  Creatinine is 2.64, markedly improved from his last admission in July.  No other metabolic derangements.  EKG is unchanged from previous.  Troponin is not elevated.  Chest x-ray is negative for acute cardiopulmonary findings.  COVID-19 admission labs are pending.  He also has a urinalysis that is pending.  Hemoglobin is 5.6, down from his baseline of 6-7.  I suspect shortness of breath and chest pain is related to sickle cell pain crisis, likely exacerbated by COVID-19 infection.  Patient has not had any hypoxia, but was placed on 2 L nasal cannula for comfort in the ER.  Will not start Decadron at this time as  patient has not been hypoxic.  Will order 2 units of packed RBCs for anemia and the fentanyl for pain control.  He will require admission for further work-up and evaluation.  Consult to the hospitalist team and Dr. Toniann Fail will accept the patient for admission.   The patient appears reasonably stabilized for admission considering the current resources, flow, and capabilities available in the ED at this time, and I doubt any other [facility] requiring further screening and/or treatment in the ED prior to admission.  Final Clinical Impression(s) / ED Diagnoses Final diagnoses:  Sickle cell disease with crisis and other complication (HCC)  COVID-19    Rx / DC Orders ED  Discharge Orders    None       Ridhi Hoffert A, PA-C 07/11/20 0409    Molpus, Jonny Ruiz, MD 07/11/20 [phone]

## 2020-07-11 NOTE — Plan of Care (Signed)
Patient is alert, oriented and no longer requiring Oxygen. He is maintaining 98% on room air. Patient called and spoke with his spouse to update her on his current status throughout the day. Patient has been calm, cooperative, and very responsive today. Patient ate all of his meals. With encouragement the patient got out of bed and ambulated in his room as far as his many lines would allow. UA collected and sent. Patient started on 0.45 at 75 post receiving two units of PRBC's. Patient has not had a BM this day but reports he doesn't normally defecate until night. Patient continues on the PCA.  Report given to nurse Alcario Drought RN.  Problem: Bowel/Gastric: Goal: Gut motility will be maintained Outcome: Progressing   Problem: Fluid Volume: Goal: Ability to maintain a balanced intake and output will improve Outcome: Progressing   Problem: Self-Care: Goal: Ability to incorporate actions that prevent/reduce pain crisis will improve Outcome: Adequate for Discharge

## 2020-07-11 NOTE — ED Notes (Signed)
Date and time results received: 07/11/20 0059 (use smartphrase ".now" to insert current time)  Test: Hemoglobin Critical Value: 5.6  Name of Provider Notified: molpus md  Orders Received? Or Actions Taken?:waiting on orders.

## 2020-07-11 NOTE — H&P (Signed)
History and Physical    Shawn Adams ZOX:[id number] DOB: 1975/11/23 DOA: 07/11/2020  PCP: Patient, No Pcp Per  Patient coming from: Home.  Chief Complaint: Shortness of breath chest pain fatigue.  HPI: Shawn Adams is a 44 y.o. male with history of sarcoidosis, sickle cell anemia, chronic kidney disease stage II who was admitted in July 2021 about 4 months ago for sepsis secondary to E. coli bacteremia at that time patient had profound renal failure which improved presents to the ER because of chest pain shortness of breath fatigue and weakness.  Patient states he had some fever and productive cough which eventually became mildly bloody and was tested for COVID-19 infection on July 07, 2020 results of which are available in Care Everywhere.  His hemoptysis stopped the following day but has become progressively weak and has been having some chest pain body aches fatigue.  Patient decided to come to the ER.  Patient was placed on empiric antibiotics by the urgent care where he was diagnosed with Covid infection.  Patient chest pain is only on deep coughing.  ED Course: In the ER patient is not hypoxic or febrile.  Chest x-ray does not show any infiltrates.  Labs are significant for hemoglobin decreased from baseline and is around 5.6 creatinine is 2.6 LDH is 342 procalcitonin 0.15 D-dimer 2.4.  Patient admitted for sickle cell pain crisis with COVID-19 infection.  2 units of PRBC transfusion was ordered by the ER physician.  Review of Systems: As per HPI, rest all negative.   Past Medical History:  Diagnosis Date  . Aneurysm (HCC)    Pt states happened in 2011, Brain   . Heart murmur   . Sarcoid   . Sickle cell anemia (HCC)     Past Surgical History:  Procedure Laterality Date  . LYMPH NODE BIOPSY    . NASAL SINUS SURGERY       reports that he has quit smoking. His smoking use included cigarettes. He has never used smokeless tobacco. He reports previous alcohol use of about 1.0  standard drink of alcohol per week. He reports previous drug use. Drug: Marijuana.  No Known Allergies  Family History  Problem Relation Age of Onset  . Diabetes Mother   . Hypertension Father   . Lupus Sister     Prior to Admission medications   Medication Sig Start Date End Date Taking? Authorizing Provider  acetaminophen (TYLENOL) 500 MG tablet Take 500 mg by mouth every 6 (six) hours as needed for moderate pain or fever (pain).    Yes [provider]  albuterol (VENTOLIN HFA) 108 (90 Base) MCG/ACT inhaler Inhale into the lungs. 01/24/20 01/23/21 Yes [provider]  Ascorbic Acid (VITAMIN C) 1000 MG tablet Take 1,000 mg by mouth daily.   Yes [provider]  budesonide-formoterol (SYMBICORT) 160-4.5 MCG/ACT inhaler Inhale 2 puffs into the lungs 2 (two) times daily. 05/10/18 07/11/20 Yes [provider]  cyclobenzaprine (FLEXERIL) 10 MG tablet Take 1 tablet (10 mg total) by mouth 3 (three) times daily as needed for muscle spasms. 04/09/18  Yes Street, [place], PA-C  docusate sodium (COLACE) 100 MG capsule Take 100 mg by mouth daily as needed for mild constipation.   Yes [provider]  ergocalciferol (VITAMIN D2) 1.25 MG (50000 UT) capsule Take 50,000 Units by mouth every 30 (thirty) days.  10/24/19  Yes [provider]  famotidine (PEPCID) 10 MG tablet Take 10 mg by mouth daily as needed for heartburn or indigestion.  Yes [provider]  folic acid (FOLVITE) 1 MG tablet Take 1 tablet (1 mg total) by mouth daily. 02/09/16  Yes Quentin Angst, MD  hydroxychloroquine (PLAQUENIL) 200 MG tablet Take 200 mg by mouth daily. 01/13/20  Yes [provider]  hydroxyurea (HYDREA) 500 MG capsule Take 2 capsules (1,000 mg total) by mouth daily. May take with food to minimize GI side effects. 02/09/16  Yes [name], [name], MD  INCRUSE ELLIPTA 62.5 MCG/INH AEPB Inhale 1 puff into the lungs daily. 03/17/20  Yes [provider]  levofloxacin (LEVAQUIN) 750 MG tablet Take 750 mg by mouth daily.  07/07/20  Yes [provider]  Melatonin 10 MG TABS Take 10 mg by mouth at bedtime.   Yes [provider]  ondansetron (ZOFRAN) 4 MG tablet Take 1 tablet (4 mg total) by mouth every 6 (six) hours. Patient taking differently: Take 4 mg by mouth every 8 (eight) hours as needed for nausea or vomiting.  12/21/15  Yes Hedges, Tinnie Gens, PA-C  OXBRYTA 500 MG TABS tablet Take 1,500 mg by mouth.  03/03/20  Yes [provider]  Oxycodone HCl 20 MG TABS Take 20 mg by mouth every 4 (four) hours as needed (pain).    Yes [provider]  sodium bicarbonate 650 MG tablet Take 1,300 mg by mouth daily.  10/14/19  Yes [provider]  vitamin B-12 (CYANOCOBALAMIN) 500 MCG tablet Take 500 mcg by mouth daily.   Yes [provider]  predniSONE (DELTASONE) 10 MG tablet Take 40 mg by mouth daily.  Patient not taking: Reported on 06/29/2020 04/09/19   [provider]    Physical Exam: Constitutional: Moderately built and nourished. Vitals:   07/11/20 0300 07/11/20 0315 07/11/20 0330 07/11/20 0345  BP: (!) 144/62 119/78 123/76 119/82  Pulse: 74 63 (!) 58 71  Resp: 15 14 11 20   Temp:      TempSrc:      SpO2: 97% 99% 100% 100%  Weight:      Height:       Eyes: Anicteric mild pallor. ENMT: No discharge from the ears eyes nose or mouth. Neck: No mass felt.  No neck rigidity. Respiratory: No rhonchi or crepitations. Cardiovascular: S1-S2 heard. Abdomen: Soft nontender bowel sounds present. Musculoskeletal: No edema. Skin: No rash. Neurologic: Alert awake oriented to time place and person.  Moves all extremities. Psychiatric: Appears normal.  Normal affect.   Labs on Admission: I have personally reviewed following labs and imaging studies  CBC: Recent Labs  Lab 07/11/20 0016  WBC 5.8  NEUTROABS 2.7  HGB 5.6*  HCT 15.3*  MCV 93.3  PLT 456*   Basic Metabolic  Panel: Recent Labs  Lab 07/11/20 0016  NA 144  K 4.2  CL 112*  CO2 20*  GLUCOSE 117*  BUN 30*  CREATININE 2.64*  CALCIUM 9.5   GFR: Estimated Creatinine Clearance: 30.3 mL/min (A) (by C-G formula based on SCr of 2.64 mg/dL (H)). Liver Function Tests: Recent Labs  Lab 07/11/20 0016  AST 30  ALT 21  ALKPHOS 91  BILITOT 1.2  PROT 7.7  ALBUMIN 3.8   No results for input(s): LIPASE, AMYLASE in the last 168 hours. No results for input(s): AMMONIA in the last 168 hours. Coagulation Profile: No results for input(s): INR, PROTIME in the last 168 hours. Cardiac Enzymes: No results for input(s): CKTOTAL, CKMB, CKMBINDEX, TROPONINI in the last 168 hours. BNP (last 3 results) No results for input(s): PROBNP in the  last 8760 hours. HbA1C: No results for input(s): HGBA1C in the last 72 hours. CBG: No results for input(s): GLUCAP in the last 168 hours. Lipid Profile: Recent Labs    07/11/20 0315  TRIG 182*   Thyroid Function Tests: No results for input(s): TSH, T4TOTAL, FREET4, T3FREE, THYROIDAB in the last 72 hours. Anemia Panel: Recent Labs    07/11/20 0016  RETICCTPCT 12.2*   Urine analysis:    Component Value Date/Time   COLORURINE YELLOW 04/10/2020 0400   APPEARANCEUR HAZY (A) 04/10/2020 0400   APPEARANCEUR Clear 10/05/2017 1600   LABSPEC 1.008 04/10/2020 0400   PHURINE 6.0 04/10/2020 0400   GLUCOSEU NEGATIVE 04/10/2020 0400   HGBUR SMALL (A) 04/10/2020 0400   BILIRUBINUR NEGATIVE 04/10/2020 0400   BILIRUBINUR neg 08/06/2018 1201   BILIRUBINUR Negative 10/05/2017 1600   KETONESUR NEGATIVE 04/10/2020 0400   PROTEINUR 30 (A) 04/10/2020 0400   UROBILINOGEN 2.0 (A) 08/06/2018 1201   UROBILINOGEN 0.2 12/08/2017 1339   NITRITE NEGATIVE 04/10/2020 0400   LEUKOCYTESUR LARGE (A) 04/10/2020 0400   Sepsis Labs: @LABRCNTIP (procalcitonin:4,lacticidven:4) )No results found for this or any previous visit (from the past 240 hour(s)).   Radiological Exams on  Admission: DG Chest Port 1 View  Result Date: 07/11/2020 CLINICAL DATA:  Sickle cell disease.  COVID-19. EXAM: PORTABLE CHEST 1 VIEW COMPARISON:  04/07/2020 FINDINGS: There is mild cardiomegaly with enlargement of the pulmonary arteries, unchanged. No focal airspace consolidation or pulmonary edema. Pleural spaces are normal. IMPRESSION: Unchanged mild cardiomegaly without focal airspace disease. Electronically Signed   By: 04/09/2020 M.D.   On: 07/11/2020 00:28    EKG: Independently reviewed.  Normal sinus rhythm with nonspecific ST changes.  Assessment/Plan Principal Problem:   Sickle cell pain crisis (HCC) Active Problems:   CKD (chronic kidney disease) stage 2, GFR 60-89 ml/min   Sarcoidosis   COVID-19 virus infection    1. Sickle cell pain crisis -patient has generalized body ache and chest pain no definite signs of any childhood chest syndrome.  Presently we will keep patient on pain relief medications on Dilaudid PCA closely monitor since patient has chronic kidney disease.  Avoiding NSAIDs due to renal failure.  We will hold off patient's hydroxyurea due to worsening hemoglobin.  Discussed with pharmacy and it was felt to be okay to continue Plaquenil and patient's Oxbryta.  Patient is also on folic acid and B12 supplements. 2. Worsening anemia likely contributing to patient's symptoms for which patient is receiving blood transfusion. 3. COVID-19 infection presently not hypoxic or febrile.  We will continue to monitor respiratory status inflammatory markers closely.  At this time I have not started on Covid specific treatment. 4. History of sarcoidosis presently on Plaquenil.  See #1 with regarding continuing Plaquenil. 5. Chronic kidney disease stage II creatinine appears to be at baseline. 6. Admitted for E. coli sepsis and July 2021 at the time patient also had profound renal failure.  Follow blood cultures.  Since patient has sickle cell pain crisis and worsening anemia with  Covid infection will need close monitoring for any further worsening in inpatient status.   DVT prophylaxis: Heparin. Code Status: Full code. Family Communication: Discussed with patient. Disposition Plan: Home. Consults called: None. Admission status: Inpatient.   August 2021 MD [facility] Pager [phone].  If 7PM-7AM, please contact night-coverage [email or web address] Password [email or web address]  07/11/2020, 4:25 AM

## 2020-07-12 DIAGNOSIS — N179 Acute kidney failure, unspecified: Secondary | ICD-10-CM

## 2020-07-12 DIAGNOSIS — D5700 Hb-SS disease with crisis, unspecified: Principal | ICD-10-CM

## 2020-07-12 DIAGNOSIS — D869 Sarcoidosis, unspecified: Secondary | ICD-10-CM | POA: Diagnosis not present

## 2020-07-12 DIAGNOSIS — U071 COVID-19: Secondary | ICD-10-CM

## 2020-07-12 DIAGNOSIS — N182 Chronic kidney disease, stage 2 (mild): Secondary | ICD-10-CM

## 2020-07-12 LAB — CBC WITH DIFFERENTIAL/PLATELET
Abs Immature Granulocytes: 0.28 10*3/uL — ABNORMAL HIGH (ref 0.00–0.07)
Basophils Absolute: 0 10*3/uL (ref 0.0–0.1)
Basophils Relative: 1 %
Eosinophils Absolute: 0.2 10*3/uL (ref 0.0–0.5)
Eosinophils Relative: 3 %
HCT: 19.2 % — ABNORMAL LOW (ref 39.0–52.0)
Hemoglobin: 6.5 g/dL — CL (ref 13.0–17.0)
Immature Granulocytes: 5 %
Lymphocytes Relative: 10 %
Lymphs Abs: 0.6 10*3/uL — ABNORMAL LOW (ref 0.7–4.0)
MCH: 30.5 pg (ref 26.0–34.0)
MCHC: 33.9 g/dL (ref 30.0–36.0)
MCV: 90.1 fL (ref 80.0–100.0)
Monocytes Absolute: 0.4 10*3/uL (ref 0.1–1.0)
Monocytes Relative: 6 %
Neutro Abs: 4.7 10*3/uL (ref 1.7–7.7)
Neutrophils Relative %: 75 %
Platelets: 382 10*3/uL (ref 150–400)
RBC: 2.13 MIL/uL — ABNORMAL LOW (ref 4.22–5.81)
RDW: 24.9 % — ABNORMAL HIGH (ref 11.5–15.5)
WBC: 6.1 10*3/uL (ref 4.0–10.5)
nRBC: 15.8 % — ABNORMAL HIGH (ref 0.0–0.2)

## 2020-07-12 LAB — COMPREHENSIVE METABOLIC PANEL
ALT: 18 U/L (ref 0–44)
AST: 28 U/L (ref 15–41)
Albumin: 3.5 g/dL (ref 3.5–5.0)
Alkaline Phosphatase: 84 U/L (ref 38–126)
Anion gap: 9 (ref 5–15)
BUN: 24 mg/dL — ABNORMAL HIGH (ref 6–20)
CO2: 18 mmol/L — ABNORMAL LOW (ref 22–32)
Calcium: 8.9 mg/dL (ref 8.9–10.3)
Chloride: 111 mmol/L (ref 98–111)
Creatinine, Ser: 2.04 mg/dL — ABNORMAL HIGH (ref 0.61–1.24)
GFR, Estimated: 38 mL/min — ABNORMAL LOW (ref >60)
Glucose, Bld: 94 mg/dL (ref 70–99)
Potassium: 4.2 mmol/L (ref 3.5–5.1)
Sodium: 138 mmol/L (ref 135–145)
Total Bilirubin: 1.3 mg/dL — ABNORMAL HIGH (ref 0.3–1.2)
Total Protein: 7 g/dL (ref 6.5–8.1)

## 2020-07-12 LAB — PREPARE RBC (CROSSMATCH)

## 2020-07-12 MED ORDER — SODIUM CHLORIDE 0.9% IV SOLUTION: Freq: Once | INTRAVENOUS | Status: DC

## 2020-07-12 MED ORDER — OXYCODONE HCL 5 MG PO TABS
20.0000 mg | ORAL_TABLET | ORAL | Status: DC | PRN
  Administered 2020-07-12 – 2020-07-13 (×2): 20 mg via ORAL
  Filled 2020-07-12 (×2): qty 4

## 2020-07-12 MED ORDER — HYDROXYUREA 500 MG PO CAPS
1000.0000 mg | ORAL_CAPSULE | Freq: Every day | ORAL | Status: DC
  Administered 2020-07-12 – 2020-07-14 (×3): 1000 mg via ORAL
  Filled 2020-07-12 (×3): qty 2

## 2020-07-12 NOTE — Progress Notes (Signed)
CRITICAL LAB VALUE: HBG 6.5 today at 636 am from lab.  Notified covering provider via page X. [place]. Pt Sickle Cell patient

## 2020-07-12 NOTE — Progress Notes (Signed)
RN followed up with blood bank and was informed that it is taking longer than expected to prepare the pt's blood. Blood bank will call when blood is ready to be transfused.

## 2020-07-12 NOTE — Progress Notes (Signed)
Blood bank informed RN that blood has arrived but needs to be crossmatched and should be ready to transfuse in an hour.

## 2020-07-12 NOTE — Progress Notes (Signed)
Patient ID: Shawn Adams, male   DOB: 09/15/1976, 44 y.o.   MRN: [id number] Subjective: Shawn Adams is a 44 y.o. male with history of sarcoidosis, sickle cell anemia, chronic kidney disease stage II, chronic pain syndrome, opiate dependence and tolerance, who was admitted in July 2021 about 4 months ago for sepsis secondary to E. coli bacteremia at that time patient had profound renal failure which improved, admitted yesterday for sickle cell pain crisis and positive COVID-19.    Patient's hemoglobin dropped below baseline and was transfused with 2 units of packed red blood cells yesterday.  However hemoglobin dropped again to 6.5 this morning from immediate post transfusion H&H of 7.4/21.3.  Today patient has no new complaint, he claims his pain is at about 6-7/10, his goal is below 5. He denies any fever, cough, chest pain, shortness of breath, nausea, vomiting or diarrhea. No urinary symptoms.  Objective:  Vital signs in last 24 hours:  Vitals:   07/12/20 0347 07/12/20 0437 07/12/20 0751 07/12/20 0808  BP:  107/70 139/82   Pulse:  (!) 58 69   Resp:  14 17 12   Temp:  97.6 F (36.4 C) (!) 97.1 F (36.2 C)   TempSrc:  Oral    SpO2: 99% 99% 99% 98%  Weight:      Height:        Intake/Output from previous day:   Intake/Output Summary (Last 24 hours) at 07/12/2020 1128 Last data filed at 07/12/2020 0934 Gross per 24 hour  Intake 3648.09 ml  Output 1500 ml  Net 2148.09 ml   Physical Exam: General: Alert, awake, oriented x3, in no acute distress. Chronically ill looking, thin built. HEENT: /AT PEERL, EOMI Neck: Trachea midline,  no masses, no thyromegal,y no JVD, no carotid bruit OROPHARYNX:  Moist, No exudate/ erythema/lesions.  Heart: Regular rate and rhythm, without murmurs, rubs, gallops, PMI non-displaced, no heaves or thrills on palpation.  Lungs: Clear to auscultation, no wheezing or rhonchi noted. No increased vocal fremitus resonant to percussion  Abdomen: Soft,  nontender, nondistended, positive bowel sounds, no masses no hepatosplenomegaly noted..  Neuro: No focal neurological deficits noted cranial nerves II through XII grossly intact. DTRs 2+ bilaterally upper and lower extremities. Strength 5 out of 5 in bilateral upper and lower extremities. Musculoskeletal: No warm swelling or erythema around joints, no spinal tenderness noted. Psychiatric: Patient alert and oriented x3, good insight and cognition, good recent to remote recall. Lymph node survey: No cervical axillary or inguinal lymphadenopathy noted.  Lab Results:  Basic Metabolic Panel:    Component Value Date/Time   NA 138 07/12/2020 0556   NA 138 05/04/2018 1352   K 4.2 07/12/2020 0556   CL 111 07/12/2020 0556   CO2 18 (L) 07/12/2020 0556   BUN 24 (H) 07/12/2020 0556   BUN 15 05/04/2018 1352   CREATININE 2.04 (H) 07/12/2020 0556   CREATININE 1.39 (H) 05/11/2016 1100   GLUCOSE 94 07/12/2020 0556   CALCIUM 8.9 07/12/2020 0556   CBC:    Component Value Date/Time   WBC 6.1 07/12/2020 0556   HGB 6.5 (LL) 07/12/2020 0556   HGB 6.2 (LL) 05/08/2018 1107   HCT 19.2 (L) 07/12/2020 0556   HCT 17.0 (LL) 05/08/2018 1107   PLT 382 07/12/2020 0556   PLT 431 05/08/2018 1107   MCV 90.1 07/12/2020 0556   MCV 98 (H) 05/08/2018 1107   NEUTROABS 4.7 07/12/2020 0556   NEUTROABS 3.4 05/08/2018 1107   LYMPHSABS 0.6 (L) 07/12/2020 0556   LYMPHSABS  3.1 05/08/2018 1107   MONOABS 0.4 07/12/2020 0556   EOSABS 0.2 07/12/2020 0556   EOSABS 0.1 05/08/2018 1107   BASOSABS 0.0 07/12/2020 0556   BASOSABS 0.3 (H) 05/08/2018 1107    Recent Results (from the past 240 hour(s))  Blood Culture (routine x 2)     Status: None (Preliminary result)   Collection Time: 07/11/20  3:15 AM   Specimen: BLOOD RIGHT FOREARM  Result Value Ref Range Status   Specimen Description   Final    BLOOD RIGHT FOREARM Performed at [facility], [address] [place], [place] [address]    Special Requests   Final     BOTTLES DRAWN AEROBIC AND ANAEROBIC Blood Culture results may not be optimal due to an excessive volume of blood received in culture bottles Performed at [facility], [address], [place], [place] [address]    Culture   Final    NO GROWTH 1 DAY Performed at [facility], [address] [place], [place] [address]    Report Status PENDING  Incomplete  Blood Culture (routine x 2)     Status: None (Preliminary result)   Collection Time: 07/11/20  3:17 AM   Specimen: BLOOD RIGHT FOREARM  Result Value Ref Range Status   Specimen Description   Final    BLOOD RIGHT FOREARM Performed at [facility], [address], [place], [place] [address]    Special Requests   Final    BOTTLES DRAWN AEROBIC AND ANAEROBIC Blood Culture results may not be optimal due to an excessive volume of blood received in culture bottles Performed at [facility], [address] [place], [place] [address]    Culture   Final    NO GROWTH 1 DAY Performed at [facility], [address] [place], [place] [address]    Report Status PENDING  Incomplete    Studies/Results: DG Chest Port 1 View  Result Date: 07/11/2020 CLINICAL DATA:  Sickle cell disease.  COVID-19. EXAM: PORTABLE CHEST 1 VIEW COMPARISON:  04/07/2020 FINDINGS: There is mild cardiomegaly with enlargement of the pulmonary arteries, unchanged. No focal airspace consolidation or pulmonary edema. Pleural spaces are normal. IMPRESSION: Unchanged mild cardiomegaly without focal airspace disease. Electronically Signed   By: Deatra Robinson M.D.   On: 07/11/2020 00:28    Medications: Scheduled Meds: . sodium chloride   Intravenous Once  . vitamin C  1,000 mg Oral Daily  . folic acid  1 mg Oral Daily  . heparin  5,000 Units Subcutaneous Q8H  . HYDROmorphone   Intravenous Q4H  . hydroxychloroquine  200 mg Oral Daily  . melatonin  10 mg Oral QHS  . mometasone-formoterol  2 puff Inhalation BID  .  senna-docusate  1 tablet Oral BID  . sodium bicarbonate  1,300 mg Oral Daily  . umeclidinium bromide  1 puff Inhalation Daily  . vitamin B-12  500 mcg Oral Daily  . voxelotor  1,500 mg Oral Daily   Continuous Infusions: . sodium chloride 75 mL/hr at 07/12/20 0642   PRN Meds:.albuterol, docusate sodium, famotidine, naloxone **AND** sodium chloride flush, ondansetron (ZOFRAN) IV, polyethylene glycol  Consultants:  None  Procedures:  None  Antibiotics:  None  Assessment/Plan: Principal Problem:   Sickle cell pain crisis (HCC) Active Problems:   CKD (chronic kidney disease) stage 2, GFR 60-89 ml/min   Sarcoidosis   COVID-19 virus infection  1. Hb Sickle Cell Disease with crisis: Reduce IVF  to [facility] since kidney function has improved significantly on gentle hydration,, continue weight based Dilaudid PCA, IV Toradol is contraindicated due to CKD.  Add home pain medications. Monitor vitals very closely, Re-evaluate pain scale regularly, 2 L of Oxygen by . Restart hydroxyurea and Oxbryta. 2. Acute Kidney Injury superimposed on Chronic Kidney Disease Stage III: Improving.  Serum creatinine is 2.04 today from 2.64 on admission.  Avoid all nephrotoxins please. 3. Symptomatic Sickle Cell Anemia: We will transfuse 1 more unit of packed red blood cells today.  Continue to monitor very closely. 4. Chronic pain Syndrome: Restart and continue home pain medications. 5. COVID-19 viral infection: Patient is asymptomatic. Chest x-ray showed mild cardiomegaly without focal airspace disease. No fever. No hypoxia.  We will continue to observe. 6. History of Sarcoidosis: Patient is clinically stable. We will continue Plaquenil. Patient encouraged to follow-up with his pulmonologist as outpatient.  Code Status: Full Code Family Communication: N/A Disposition Plan: Not yet ready for discharge  Aris Moman  If 7PM-7AM, please contact night-coverage.  07/12/2020, 11:28 AM  LOS: 1 day

## 2020-07-13 DIAGNOSIS — D5700 Hb-SS disease with crisis, unspecified: Secondary | ICD-10-CM | POA: Diagnosis not present

## 2020-07-13 LAB — BASIC METABOLIC PANEL
Anion gap: 8 (ref 5–15)
BUN: 21 mg/dL — ABNORMAL HIGH (ref 6–20)
CO2: 18 mmol/L — ABNORMAL LOW (ref 22–32)
Calcium: 8.5 mg/dL — ABNORMAL LOW (ref 8.9–10.3)
Chloride: 110 mmol/L (ref 98–111)
Creatinine, Ser: 1.94 mg/dL — ABNORMAL HIGH (ref 0.61–1.24)
GFR, Estimated: 41 mL/min — ABNORMAL LOW (ref >60)
Glucose, Bld: 99 mg/dL (ref 70–99)
Potassium: 4.2 mmol/L (ref 3.5–5.1)
Sodium: 136 mmol/L (ref 135–145)

## 2020-07-13 LAB — CBC WITH DIFFERENTIAL/PLATELET
Abs Immature Granulocytes: 0.21 10*3/uL — ABNORMAL HIGH (ref 0.00–0.07)
Basophils Absolute: 0 10*3/uL (ref 0.0–0.1)
Basophils Relative: 0 %
Eosinophils Absolute: 0.2 10*3/uL (ref 0.0–0.5)
Eosinophils Relative: 3 %
HCT: 22.9 % — ABNORMAL LOW (ref 39.0–52.0)
Hemoglobin: 7.8 g/dL — ABNORMAL LOW (ref 13.0–17.0)
Immature Granulocytes: 3 %
Lymphocytes Relative: 14 %
Lymphs Abs: 1 10*3/uL (ref 0.7–4.0)
MCH: 30.6 pg (ref 26.0–34.0)
MCHC: 34.1 g/dL (ref 30.0–36.0)
MCV: 89.8 fL (ref 80.0–100.0)
Monocytes Absolute: 0.5 10*3/uL (ref 0.1–1.0)
Monocytes Relative: 6 %
Neutro Abs: 5.4 10*3/uL (ref 1.7–7.7)
Neutrophils Relative %: 74 %
Platelets: 384 10*3/uL (ref 150–400)
RBC: 2.55 MIL/uL — ABNORMAL LOW (ref 4.22–5.81)
RDW: 23.4 % — ABNORMAL HIGH (ref 11.5–15.5)
WBC: 7.3 10*3/uL (ref 4.0–10.5)
nRBC: 13.6 % — ABNORMAL HIGH (ref 0.0–0.2)

## 2020-07-13 NOTE — Progress Notes (Signed)
Subjective: Shawn Adams is a 44 year old male with a medical history significant for sickle cell disease, sarcoidosis, chronic kidney disease stage II, chronic pain syndrome, opiate dependence and tolerance was admitted for sickle cell crisis. Patient also positive for COVID-19 infection.  Today, patient continues to complain of significant pain primarily to upper and lower extremities. Patient also endorses some shortness of breath. He denies persistent cough, headache, fever, chills, chest pain, urinary symptoms, nausea, vomiting, or diarrhea.  Objective:  Vital signs in last 24 hours:  Vitals:   07/13/20 0803 07/13/20 0805 07/13/20 1140 07/13/20 1219  BP:  116/73 111/75   Pulse:  68 71   Resp: 18 16 16 16   Temp:  98.2 F (36.8 C) 98.5 F (36.9 C)   TempSrc:  Oral Oral   SpO2: 96% 99% 100% 100%  Weight:      Height:        Intake/Output from previous day:   Intake/Output Summary (Last 24 hours) at 07/13/2020 1231 Last data filed at 07/13/2020 0114 Gross per 24 hour  Intake 584.28 ml  Output 500 ml  Net 84.28 ml    Physical Exam: General: Alert, awake, oriented x3, in no acute distress.  HEENT: [place]/AT PEERL, EOMI Neck: Trachea midline,  no masses, no thyromegal,y no JVD, no carotid bruit OROPHARYNX:  Moist, No exudate/ erythema/lesions.  Heart: Regular rate and rhythm, without murmurs, rubs, gallops, PMI non-displaced, no heaves or thrills on palpation.  Lungs: Clear to auscultation, no wheezing or rhonchi noted. No increased vocal fremitus resonant to percussion  Abdomen: Soft, nontender, nondistended, positive bowel sounds, no masses no hepatosplenomegaly noted..  Neuro: No focal neurological deficits noted cranial nerves II through XII grossly intact. DTRs 2+ bilaterally upper and lower extremities. Strength 5 out of 5 in bilateral upper and lower extremities. Musculoskeletal: No warm swelling or erythema around joints, no spinal tenderness noted. Psychiatric: Patient  alert and oriented x3, good insight and cognition, good recent to remote recall. Lymph node survey: No cervical axillary or inguinal lymphadenopathy noted.  Lab Results:  Basic Metabolic Panel:    Component Value Date/Time   NA 136 07/13/2020 0425   NA 138 05/04/2018 1352   K 4.2 07/13/2020 0425   CL 110 07/13/2020 0425   CO2 18 (L) 07/13/2020 0425   BUN 21 (H) 07/13/2020 0425   BUN 15 05/04/2018 1352   CREATININE 1.94 (H) 07/13/2020 0425   CREATININE 1.39 (H) 05/11/2016 1100   GLUCOSE 99 07/13/2020 0425   CALCIUM 8.5 (L) 07/13/2020 0425   CBC:    Component Value Date/Time   WBC 7.3 07/13/2020 0425   HGB 7.8 (L) 07/13/2020 0425   HGB 6.2 (LL) 05/08/2018 1107   HCT 22.9 (L) 07/13/2020 0425   HCT 17.0 (LL) 05/08/2018 1107   PLT 384 07/13/2020 0425   PLT 431 05/08/2018 1107   MCV 89.8 07/13/2020 0425   MCV 98 (H) 05/08/2018 1107   NEUTROABS 5.4 07/13/2020 0425   NEUTROABS 3.4 05/08/2018 1107   LYMPHSABS 1.0 07/13/2020 0425   LYMPHSABS 3.1 05/08/2018 1107   MONOABS 0.5 07/13/2020 0425   EOSABS 0.2 07/13/2020 0425   EOSABS 0.1 05/08/2018 1107   BASOSABS 0.0 07/13/2020 0425   BASOSABS 0.3 (H) 05/08/2018 1107    Recent Results (from the past 240 hour(s))  Blood Culture (routine x 2)     Status: None (Preliminary result)   Collection Time: 07/11/20  3:15 AM   Specimen: BLOOD RIGHT FOREARM  Result Value Ref Range  Status   Specimen Description   Final    BLOOD RIGHT FOREARM Performed at [facility], [address] [place], [place] [address]    Special Requests   Final    BOTTLES DRAWN AEROBIC AND ANAEROBIC Blood Culture results may not be optimal due to an excessive volume of blood received in culture bottles Performed at [facility], [address] [place], [place] [address]    Culture   Final    NO GROWTH 2 DAYS Performed at [facility], [address], [place], [place] [address]    Report Status PENDING  Incomplete  Blood  Culture (routine x 2)     Status: None (Preliminary result)   Collection Time: 07/11/20  3:17 AM   Specimen: BLOOD RIGHT FOREARM  Result Value Ref Range Status   Specimen Description   Final    BLOOD RIGHT FOREARM Performed at [facility], [address], [place], [place] [address]    Special Requests   Final    BOTTLES DRAWN AEROBIC AND ANAEROBIC Blood Culture results may not be optimal due to an excessive volume of blood received in culture bottles Performed at [facility], [address], [place], [place] [address]    Culture   Final    NO GROWTH 2 DAYS Performed at [facility], [address], [place], [place] [address]    Report Status PENDING  Incomplete    Studies/Results: No results found.  Medications: Scheduled Meds: . sodium chloride   Intravenous Once  . vitamin C  1,000 mg Oral Daily  . folic acid  1 mg Oral Daily  . heparin  5,000 Units Subcutaneous Q8H  . HYDROmorphone   Intravenous Q4H  . hydroxychloroquine  200 mg Oral Daily  . hydroxyurea  1,000 mg Oral Daily  . melatonin  10 mg Oral QHS  . mometasone-formoterol  2 puff Inhalation BID  . senna-docusate  1 tablet Oral BID  . sodium bicarbonate  1,300 mg Oral Daily  . umeclidinium bromide  1 puff Inhalation Daily  . vitamin B-12  500 mcg Oral Daily  . voxelotor  1,500 mg Oral Daily   Continuous Infusions: . sodium chloride 10 mL/hr at 07/12/20 1800   PRN Meds:.albuterol, docusate sodium, famotidine, naloxone **AND** sodium chloride flush, ondansetron (ZOFRAN) IV, oxyCODONE, polyethylene glycol  Consultants:  None  Procedures:  None  Antibiotics:  None  Assessment/Plan: Principal Problem:   Sickle cell pain crisis (HCC) Active Problems:   CKD (chronic kidney disease) stage 2, GFR 60-89 ml/min   Sarcoidosis   COVID-19 virus infection  Sickle cell disease with pain crisis: Continue IV fluids at [facility]. Continue full dose IV Dilaudid PCA IV Toradol  contraindicated due to CKD Also, continue home medications Monitor vital signs closely, reevaluate pain scale regularly, and supplemental oxygen as needed  Acute kidney injury superimposed on CKD stage III: Improving. Serum creatinine 1.9. Continue to avoid nephrotoxins. Follow labs in a.m.  Symptomatic sickle cell anemia: Hemoglobin is 7.8, consistent with patient's baseline. Patient is s/p 2 units PRBCs. Continue to monitor closely.  Chronic pain syndrome: Continue home medications  COVID-19 viral infection: Patient endorses mild shortness of breath with exertion. Most recent chest x-ray showed mild cardiomegaly without focal airspace disease. Patient is afebrile. Oxygen saturation is 100% on RA. Continue to observe closely.  History of sarcoidosis: Clinically stable. Continue Plaquenil. Follow-up with pulmonology as outpatient.  Code Status: Full Code Family Communication:  N/A Disposition Plan: Not yet ready for discharge  Nolon Nations  APRN, MSN, FNP-C [facility] [address] [place], [place] [address] [phone]  If 5PM-8AM, please contact night-coverage.  07/13/2020, 12:31 PM  LOS: 2 days

## 2020-07-14 DIAGNOSIS — U071 COVID-19: Secondary | ICD-10-CM | POA: Diagnosis not present

## 2020-07-14 DIAGNOSIS — D5700 Hb-SS disease with crisis, unspecified: Secondary | ICD-10-CM | POA: Diagnosis not present

## 2020-07-14 DIAGNOSIS — R0602 Shortness of breath: Secondary | ICD-10-CM

## 2020-07-14 DIAGNOSIS — D5709 Hb-ss disease with crisis with other specified complication: Secondary | ICD-10-CM

## 2020-07-14 LAB — CBC
HCT: 26.5 % — ABNORMAL LOW (ref 39.0–52.0)
Hemoglobin: 8.8 g/dL — ABNORMAL LOW (ref 13.0–17.0)
MCH: 31.1 pg (ref 26.0–34.0)
MCHC: 33.2 g/dL (ref 30.0–36.0)
MCV: 93.6 fL (ref 80.0–100.0)
Platelets: 420 10*3/uL — ABNORMAL HIGH (ref 150–400)
RBC: 2.83 MIL/uL — ABNORMAL LOW (ref 4.22–5.81)
RDW: 23.8 % — ABNORMAL HIGH (ref 11.5–15.5)
WBC: 14.8 10*3/uL — ABNORMAL HIGH (ref 4.0–10.5)
nRBC: 5.7 % — ABNORMAL HIGH (ref 0.0–0.2)

## 2020-07-14 LAB — BASIC METABOLIC PANEL
Anion gap: 8 (ref 5–15)
BUN: 27 mg/dL — ABNORMAL HIGH (ref 6–20)
CO2: 18 mmol/L — ABNORMAL LOW (ref 22–32)
Calcium: 8.9 mg/dL (ref 8.9–10.3)
Chloride: 113 mmol/L — ABNORMAL HIGH (ref 98–111)
Creatinine, Ser: 2.24 mg/dL — ABNORMAL HIGH (ref 0.61–1.24)
GFR, Estimated: 34 mL/min — ABNORMAL LOW (ref >60)
Glucose, Bld: 95 mg/dL (ref 70–99)
Potassium: 4.7 mmol/L (ref 3.5–5.1)
Sodium: 139 mmol/L (ref 135–145)

## 2020-07-14 MED ORDER — SODIUM CHLORIDE 0.9 % IV BOLUS
500.0000 mL | Freq: Once | INTRAVENOUS | Status: AC
  Administered 2020-07-14: 500 mL via INTRAVENOUS

## 2020-07-14 MED ORDER — ACETAMINOPHEN 325 MG PO TABS
650.0000 mg | ORAL_TABLET | Freq: Four times a day (QID) | ORAL | Status: DC | PRN
  Administered 2020-07-14: 650 mg via ORAL
  Filled 2020-07-14: qty 2

## 2020-07-14 NOTE — Progress Notes (Addendum)
Discharge instructions given to patient with verbalized understanding. IV removed. 16 mL of Dilaudid wasted in stericycle with nurse Maryln Manuel      r n. Patient will driving self home.

## 2020-07-14 NOTE — Progress Notes (Addendum)
°   07/14/20 0349  Assess: MEWS Score  Temp 98.8 F (37.1 C)  BP 124/64  Pulse Rate 96  Resp 15  SpO2 95 %  O2 Device Nasal Cannula  Patient Activity (if Appropriate) In bed  O2 Flow Rate (L/min) 1 L/min  Assess: MEWS Score  MEWS Temp 0  MEWS Systolic 0  MEWS Pulse 0  MEWS RR 0  MEWS LOC 0  MEWS Score 0  MEWS Score Color Green  Assess: if the MEWS score is Yellow or Red  Were vital signs taken at a resting state? Yes  Focused Assessment No change from prior assessment  Early Detection of Sepsis Score *See Row Information* Medium  MEWS guidelines implemented *See Row Information* No, previously yellow, continue vital signs every 4 hours  Treat  Pain Scale 0-10  Pain Score 3  Pain Type Chronic pain  Pain Location Generalized  Pain Orientation Left  Pain Descriptors / Indicators Throbbing  Pain Frequency Constant  Pain Onset On-going  Pain Intervention(s) Medication (See eMAR)  Multiple Pain Sites No  Patients response to intervention Effective  Take Vital Signs  Increase Vital Sign Frequency  Yellow: Q 2hr X 2 then Q 4hr X 2, if remains yellow, continue Q 4hrs  Escalate  MEWS: Escalate Yellow: discuss with charge nurse/RN and consider discussing with provider and RRT (charge nurse made aware of changes and effective treatment)  Notify: Charge Nurse/RN  Name of Charge Nurse/RN Notified Debbie  Date Charge Nurse/RN Notified 07/14/20  Time Charge Nurse/RN Notified 0400  Document  Patient Outcome Stabilized after interventions  Progress note created (see row info) Yes   Interventions successful. Temp and HR back within normal baseline. Charge nurse updated. Will continue to monitor the patient.

## 2020-07-14 NOTE — Care Management Important Message (Signed)
Important Message  Patient Details IM Letter given to the Patient Name: Shawn Adams MRN: [id number] Date of Birth: April 24, 1976   Medicare Important Message Given:  Yes     Caren Macadam 07/14/2020, 10:47 AM

## 2020-07-14 NOTE — Progress Notes (Addendum)
   07/13/20 2352  Assess: MEWS Score  Temp (!) 101 F (38.3 C)  BP 139/77  Pulse Rate (!) 104  Resp 18  Level of Consciousness Alert  SpO2 99 %  O2 Device Nasal Cannula  Patient Activity (if Appropriate) In bed  O2 Flow Rate (L/min) 1 L/min  Assess: MEWS Score  MEWS Temp 1  MEWS Systolic 0  MEWS Pulse 1  MEWS RR 0  MEWS LOC 0  MEWS Score 2  MEWS Score Color Yellow  Assess: if the MEWS score is Yellow or Red  Were vital signs taken at a resting state? Yes  Focused Assessment Change from prior assessment (see assessment flowsheet) (tachycardia)  Early Detection of Sepsis Score *See Row Information* Medium  MEWS guidelines implemented *See Row Information* Yes  Treat  MEWS Interventions Escalated (See documentation below)  Pain Scale 0-10  Pain Score 5  Pain Type Chronic pain  Pain Location Generalized  Pain Orientation Left  Pain Descriptors / Indicators Throbbing  Pain Frequency Constant  Pain Onset On-going  Pain Intervention(s) Medication (See eMAR);MD notified (Comment);RN made aware  Multiple Pain Sites No  Take Vital Signs  Increase Vital Sign Frequency  Yellow: Q 2hr X 2 then Q 4hr X 2, if remains yellow, continue Q 4hrs  Escalate  MEWS: Escalate Yellow: discuss with charge nurse/RN and consider discussing with provider and RRT  Notify: Charge Nurse/RN  Name of Charge Nurse/RN Notified Debbie  Date Charge Nurse/RN Notified 07/14/20  Time Charge Nurse/RN Notified 0012  Notify: Provider  Provider Name/Title M. Katherina Right  Date Provider Notified 07/14/20  Time Provider Notified 0000  Notification Type Page  Notification Reason  (increased temp and HR)  Response See new orders  Date of Provider Response 07/14/20  Time of Provider Response 0005  Document  Patient Outcome Other (Comment) (remain on unit with prn meds administered)  Progress note created (see row info) Yes   Patient yellow MEWS tachycardia HR fluctuating 104-110, temp 101. Patient is also in pain  and have not used any of his med via PCA since the last PCA documentation at 20004. Patient encouraged by this nurse to use PCA to help manage his pain. PRN Oxy offered, patient refused. No SOB noted or neuro changes noted. Charge nurse notified. Cherylin Mylar on-call attending notified via AMION, new orders to administer PRN tylenol and ice packs applied to bilateral underarms. Will continue to monitor the patient.

## 2020-07-14 NOTE — Progress Notes (Signed)
Wasted 16 mg of dilaudid (1mg /ml) with LPN in med room at pt d/c.

## 2020-07-14 NOTE — Progress Notes (Signed)
   07/14/20 0152  Assess: MEWS Score  Temp (!) 100.4 F (38 C)  BP 122/67  Pulse Rate (!) 110  Level of Consciousness Alert  SpO2 99 %  O2 Device Nasal Cannula  Patient Activity (if Appropriate) In bed  O2 Flow Rate (L/min) 1 L/min  Assess: MEWS Score  MEWS Temp 0  MEWS Systolic 0  MEWS Pulse 1  MEWS RR 0  MEWS LOC 0  MEWS Score 1  MEWS Score Color Green  Assess: if the MEWS score is Yellow or Red  Were vital signs taken at a resting state? Yes  Focused Assessment No change from prior assessment  Early Detection of Sepsis Score *See Row Information* Medium  MEWS guidelines implemented *See Row Information* Yes  Treat  MEWS Interventions Other (Comment) (ice packs applied under bilateral arms)  Pain Scale 0-10  Pain Score 6  Pain Type Chronic pain  Pain Location Generalized  Pain Orientation Left  Pain Descriptors / Indicators Throbbing  Pain Frequency Constant  Pain Onset On-going  Pain Intervention(s) Medication (See eMAR)  Multiple Pain Sites No  Patients response to intervention Unchanged  Take Vital Signs  Increase Vital Sign Frequency  Yellow: Q 2hr X 2 then Q 4hr X 2, if remains yellow, continue Q 4hrs  Document  Patient Outcome Other (Comment) (remains stable with tachycardia, temp 100.4)  Progress note created (see row info) Yes   Patient is now green MEWS, tachycardia remain with some improvement. Temp down to 100.4 with ice packs applied to underarms bilaterally. Patient remain alert and encouraged to use PCA pump to help control his pain, only 0.6mg  used since last assessment. Oxy offered, patient refused. Charge nurse updated. Will continue to monitor the patient.

## 2020-07-14 NOTE — Discharge Instructions (Signed)
COVID-19 COVID-19 is a respiratory infection that is caused by a virus called severe acute respiratory syndrome coronavirus 2 (SARS-CoV-2). The disease is also known as coronavirus disease or novel coronavirus. In some people, the virus may not cause any symptoms. In others, it may cause a serious infection. The infection can get worse quickly and can lead to complications, such as:  Pneumonia, or infection of the lungs.  Acute respiratory distress syndrome or ARDS. This is a condition in which fluid build-up in the lungs prevents the lungs from filling with air and passing oxygen into the blood.  Acute respiratory failure. This is a condition in which there is not enough oxygen passing from the lungs to the body or when carbon dioxide is not passing from the lungs out of the body.  Sepsis or septic shock. This is a serious bodily reaction to an infection.  Blood clotting problems.  Secondary infections due to bacteria or fungus.  Organ failure. This is when your body's organs stop working. The virus that causes COVID-19 is contagious. This means that it can spread from person to person through droplets from coughs and sneezes (respiratory secretions). What are the causes? This illness is caused by a virus. You may catch the virus by:  Breathing in droplets from an infected person. Droplets can be spread by a person breathing, speaking, singing, coughing, or sneezing.  Touching something, like a table or a doorknob, that was exposed to the virus (contaminated) and then touching your mouth, nose, or eyes. What increases the risk? Risk for infection You are more likely to be infected with this virus if you:  Are within 6 feet (2 meters) of a person with COVID-19.  Provide care for or live with a person who is infected with COVID-19.  Spend time in crowded indoor spaces or live in shared housing. Risk for serious illness You are more likely to become seriously ill from the virus if you:   Are 50 years of age or older. The higher your age, the more you are at risk for serious illness.  Live in a nursing home or long-term care facility.  Have cancer.  Have a long-term (chronic) disease such as: ? Chronic lung disease, including chronic obstructive pulmonary disease or asthma. ? A long-term disease that lowers your body's ability to fight infection (immunocompromised). ? Heart disease, including heart failure, a condition in which the arteries that lead to the heart become narrow or blocked (coronary artery disease), a disease which makes the heart muscle thick, weak, or stiff (cardiomyopathy). ? Diabetes. ? Chronic kidney disease. ? Sickle cell disease, a condition in which red blood cells have an abnormal "sickle" shape. ? Liver disease.  Are obese. What are the signs or symptoms? Symptoms of this condition can range from mild to severe. Symptoms may appear any time from 2 to 14 days after being exposed to the virus. They include:  A fever or chills.  A cough.  Difficulty breathing.  Headaches, body aches, or muscle aches.  Runny or stuffy (congested) nose.  A sore throat.  New loss of taste or smell. Some people may also have stomach problems, such as nausea, vomiting, or diarrhea. Other people may not have any symptoms of COVID-19. How is this diagnosed? This condition may be diagnosed based on:  Your signs and symptoms, especially if: ? You live in an area with a COVID-19 outbreak. ? You recently traveled to or from an area where the virus is common. ? You   provide care for or live with a person who was diagnosed with COVID-19. ? You were exposed to a person who was diagnosed with COVID-19.  A physical exam.  Lab tests, which may include: ? Taking a sample of fluid from the back of your nose and throat (nasopharyngeal fluid), your nose, or your throat using a swab. ? A sample of mucus from your lungs (sputum). ? Blood tests.  Imaging tests, which  may include, X-rays, CT scan, or ultrasound. How is this treated? At present, there is no medicine to treat COVID-19. Medicines that treat other diseases are being used on a trial basis to see if they are effective against COVID-19. Your health care provider will talk with you about ways to treat your symptoms. For most people, the infection is mild and can be managed at home with rest, fluids, and over-the-counter medicines. Treatment for a serious infection usually takes places in a hospital intensive care unit (ICU). It may include one or more of the following treatments. These treatments are given until your symptoms improve.  Receiving fluids and medicines through an IV.  Supplemental oxygen. Extra oxygen is given through a tube in the nose, a face mask, or a hood.  Positioning you to lie on your stomach (prone position). This makes it easier for oxygen to get into the lungs.  Continuous positive airway pressure (CPAP) or bi-level positive airway pressure (BPAP) machine. This treatment uses mild air pressure to keep the airways open. A tube that is connected to a motor delivers oxygen to the body.  Ventilator. This treatment moves air into and out of the lungs by using a tube that is placed in your windpipe.  Tracheostomy. This is a procedure to create a hole in the neck so that a breathing tube can be inserted.  Extracorporeal membrane oxygenation (ECMO). This procedure gives the lungs a chance to recover by taking over the functions of the heart and lungs. It supplies oxygen to the body and removes carbon dioxide. Follow these instructions at home: Lifestyle  If you are sick, stay home except to get medical care. Your health care provider will tell you how long to stay home. Call your health care provider before you go for medical care.  Rest at home as told by your health care provider.  Do not use any products that contain nicotine or tobacco, such as cigarettes, e-cigarettes, and  chewing tobacco. If you need help quitting, ask your health care provider.  Return to your normal activities as told by your health care provider. Ask your health care provider what activities are safe for you. General instructions  Take over-the-counter and prescription medicines only as told by your health care provider.  Drink enough fluid to keep your urine pale yellow.  Keep all follow-up visits as told by your health care provider. This is important. How is this prevented?  There is no vaccine to help prevent COVID-19 infection. However, there are steps you can take to protect yourself and others from this virus. To protect yourself:   Do not travel to areas where COVID-19 is a risk. The areas where COVID-19 is reported change often. To identify high-risk areas and travel restrictions, check the CDC travel website: wwwnc.cdc.gov/travel/notices  If you live in, or must travel to, an area where COVID-19 is a risk, take precautions to avoid infection. ? Stay away from people who are sick. ? Wash your hands often with soap and water for 20 seconds. If soap and water   are not available, use an alcohol-based hand sanitizer. ? Avoid touching your mouth, face, eyes, or nose. ? Avoid going out in public, follow guidance from your state and local health authorities. ? If you must go out in public, wear a cloth face covering or face mask. Make sure your mask covers your nose and mouth. ? Avoid crowded indoor spaces. Stay at least 6 feet (2 meters) away from others. ? Disinfect objects and surfaces that are frequently touched every day. This may include:  Counters and tables.  Doorknobs and light switches.  Sinks and faucets.  Electronics, such as phones, remote controls, keyboards, computers, and tablets. To protect others: If you have symptoms of COVID-19, take steps to prevent the virus from spreading to others.  If you think you have a COVID-19 infection, contact your health care  provider right away. Tell your health care team that you think you may have a COVID-19 infection.  Stay home. Leave your house only to seek medical care. Do not use public transport.  Do not travel while you are sick.  Wash your hands often with soap and water for 20 seconds. If soap and water are not available, use alcohol-based hand sanitizer.  Stay away from other members of your household. Let healthy household members care for children and pets, if possible. If you have to care for children or pets, wash your hands often and wear a mask. If possible, stay in your own room, separate from others. Use a different bathroom.  Make sure that all people in your household wash their hands well and often.  Cough or sneeze into a tissue or your sleeve or elbow. Do not cough or sneeze into your hand or into the air.  Wear a cloth face covering or face mask. Make sure your mask covers your nose and mouth. Where to find more information  Centers for Disease Control and Prevention: www.cdc.gov/coronavirus/2019-ncov/index.html  World Health Organization: www.who.int/health-topics/coronavirus Contact a health care provider if:  You live in or have traveled to an area where COVID-19 is a risk and you have symptoms of the infection.  You have had contact with someone who has COVID-19 and you have symptoms of the infection. Get help right away if:  You have trouble breathing.  You have pain or pressure in your chest.  You have confusion.  You have bluish lips and fingernails.  You have difficulty waking from sleep.  You have symptoms that get worse. These symptoms may represent a serious problem that is an emergency. Do not wait to see if the symptoms will go away. Get medical help right away. Call your local emergency services (911 in the [place] Do not drive yourself to the hospital. Let the emergency medical personnel know if you think you have COVID-19. Summary  COVID-19 is a  respiratory infection that is caused by a virus. It is also known as coronavirus disease or novel coronavirus. It can cause serious infections, such as pneumonia, acute respiratory distress syndrome, acute respiratory failure, or sepsis.  The virus that causes COVID-19 is contagious. This means that it can spread from person to person through droplets from breathing, speaking, singing, coughing, or sneezing.  You are more likely to develop a serious illness if you are 50 years of age or older, have a weak immune system, live in a nursing home, or have chronic disease.  There is no medicine to treat COVID-19. Your health care provider will talk with you about ways to treat your symptoms.    Take steps to protect yourself and others from infection. Wash your hands often and disinfect objects and surfaces that are frequently touched every day. Stay away from people who are sick and wear a mask if you are sick. This information is not intended to replace advice given to you by your health care provider. Make sure you discuss any questions you have with your health care provider. Document Revised: 07/12/2019 Document Reviewed: 10/18/2018 Elsevier Patient Education  2020 Else[employer] Inc.  

## 2020-07-14 NOTE — Discharge Summary (Signed)
Physician Discharge Summary  Shawn Adams ZTI:[id number] DOB: [birth year] DOA: 07/11/2020  PCP: Patient, No Pcp Per  Admit date: 07/11/2020  Discharge date: 07/14/2020  Discharge Diagnoses:  Principal Problem:   Sickle cell pain crisis (HCC) Active Problems:   CKD (chronic kidney disease) stage 2, GFR 60-89 ml/min   Sarcoidosis   COVID-19 virus infection   Discharge Condition: Stable  Disposition:  Pt is discharged home in good condition and is to follow up with Thad Ranger, NP  this week to have labs evaluated. Shawn Adams is instructed to increase activity slowly and balance with rest for the next few days, and use prescribed medication to complete treatment of pain  Diet: Regular Wt Readings from Last 3 Encounters:  07/10/20 59.9 kg  06/29/20 59.9 kg  05/28/20 59.6 kg    History of present illness:  Shawn Adams is a 44 year old male with a medical history significant for sarcoidosis, sickle cell disease, chronic kidney disease stage II that presented to the emergency department with complaints of chest pain, shortness of breath, and weakness.  Patient was admitted back in July 2021, about 4 months ago for sepsis secondary to E. coli bacteremia.  At that time patient had profound renal failure.  Patient states that he has had some fever and productive cough, which eventually became mildly bloody.  Patient was tested for COVID-19 infection on July 07, 2020, results are positive.  His hemoptysis dissipated the following day, but has become progressively weak and having some chest pain, body aches, and fatigue.  Patient decided to come to the ER.  He was placed on empiric antibiotics by the urgent care where he was diagnosed with COVID-19 infection.  Patient's chest pain typically occurs after coughing.  ER course: In the ER, patient is not hypoxic or febrile.  Chest x-ray does not show any infiltrates.  Labs are significant for hemoglobin decreased from baseline and is around  5.6.  Creatinine is 2.6.  LDH is 342, procalcitonin is 0.15, and D-dimer is 2.4.  Patient admitted for sickle cell pain crisis with COVID-19 infection.  2 units of PRBCs were ordered by the ER physician.  Hospital Course:  COVID-19 infection: Patient was periodically febrile throughout admission.  He denies any shortness of breath, improved.  Patient's oxygen saturation is 100% on RA.  He is ambulating without assistance.  Patient advised to continue supportive care.  He was diagnosed with COVID-19 infection on 07/07/2020.  Advised to follow-up with PCP following isolation.  Recommend Tylenol 650 mg every 6 hours as needed for fever and mild pain.  No antibiotics needed at this time.  Sickle cell disease with pain crisis: Patient was admitted for sickle cell pain crisis and managed appropriately with IVF, IV Dilaudid via PCA  as well as other adjunct therapies per sickle cell pain management protocols. Patient's pain intensity decreased to 4/10.  He says that he can manage at home on current pain management regimen.  Patient typically takes oxycodone 20 mg every 4 hours as needed.  Medication is managed by patient's hematologist.  Advised to follow-up as scheduled.  Chronic kidney disease stage II: Patient has a history of CKD 2.  He was previously followed by Dr. Sharla Kidney at [facility] urology.  Patient has been lost to follow-up, advised to make a first available appointment for routine management. Also, patient advised to follow-up with PCP in 1 week to repeat BMP. Patient will follow up with Thad Ranger, ANP.  Patient was therefore discharged home today in  a hemodynamically stable condition.   Shawn Adams will follow-up with PCP within 1 week of this discharge. Shawn Adams was counseled extensively about nonpharmacologic means of pain management, patient verbalized understanding and was appreciative of  the care received during this admission.   We discussed the need for good hydration, monitoring of  hydration status, avoidance of heat, cold, stress, and infection triggers. We discussed the need to be adherent with taking Hydrea and other home medications. Patient was reminded of the need to seek medical attention immediately if any symptom of bleeding, anemia, or infection occurs.  Discharge Exam: Vitals:   07/14/20 1356 07/14/20 1546  BP: 111/70   Pulse: 96   Resp: 14 13  Temp: 98.4 F (36.9 C)   SpO2: 100% 100%   Vitals:   07/14/20 1158 07/14/20 1158 07/14/20 1356 07/14/20 1546  BP:  110/67 111/70   Pulse:  98 96   Resp: 13 14 14 13   Temp:  99.3 F (37.4 C) 98.4 F (36.9 C)   TempSrc:   Oral   SpO2: 100% 100% 100% 100%  Weight:      Height:        General appearance : Awake, alert, not in any distress. Speech Clear. Not toxic looking HEENT: Atraumatic and Normocephalic, pupils equally reactive to light and accomodation Neck: Supple, no JVD. No cervical lymphadenopathy.  Chest: Good air entry bilaterally, no added sounds  CVS: S1 S2 regular, no murmurs.  Abdomen: Bowel sounds present, Non tender and not distended with no gaurding, rigidity or rebound. Extremities: B/L Lower Ext shows no edema, both legs are warm to touch Neurology: Awake alert, and oriented X 3, CN II-XII intact, Non focal Skin: No Rash  Discharge Instructions   Allergies as of 07/14/2020   No Known Allergies     Medication List    TAKE these medications   acetaminophen 500 MG tablet Commonly known as: TYLENOL Take 500 mg by mouth every 6 (six) hours as needed for moderate pain or fever (pain).   albuterol 108 (90 Base) MCG/ACT inhaler Commonly known as: VENTOLIN HFA Inhale into the lungs.   cyclobenzaprine 10 MG tablet Commonly known as: FLEXERIL Take 1 tablet (10 mg total) by mouth 3 (three) times daily as needed for muscle spasms.   docusate sodium 100 MG capsule Commonly known as: COLACE Take 100 mg by mouth daily as needed for mild constipation.   ergocalciferol 1.25 MG (50000  UT) capsule Commonly known as: VITAMIN D2 Take 50,000 Units by mouth every 30 (thirty) days.   famotidine 10 MG tablet Commonly known as: PEPCID Take 10 mg by mouth daily as needed for heartburn or indigestion.   folic acid 1 MG tablet Commonly known as: FOLVITE Take 1 tablet (1 mg total) by mouth daily.   hydroxychloroquine 200 MG tablet Commonly known as: PLAQUENIL Take 200 mg by mouth daily.   hydroxyurea 500 MG capsule Commonly known as: HYDREA Take 2 capsules (1,000 mg total) by mouth daily. May take with food to minimize GI side effects.   Incruse Ellipta 62.5 MCG/INH Aepb Generic drug: umeclidinium bromide Inhale 1 puff into the lungs daily.   levofloxacin 750 MG tablet Commonly known as: LEVAQUIN Take 750 mg by mouth daily.   Melatonin 10 MG Tabs Take 10 mg by mouth at bedtime.   ondansetron 4 MG tablet Commonly known as: ZOFRAN Take 1 tablet (4 mg total) by mouth every 6 (six) hours. What changed:   when to take this  reasons to  take this   Oxbryta 500 MG Tabs tablet Generic drug: voxelotor Take 1,500 mg by mouth.   Oxycodone HCl 20 MG Tabs Take 20 mg by mouth every 4 (four) hours as needed (pain).   predniSONE 10 MG tablet Commonly known as: DELTASONE Take 40 mg by mouth daily.   sodium bicarbonate 650 MG tablet Take 1,300 mg by mouth daily.   Symbicort 160-4.5 MCG/ACT inhaler Generic drug: budesonide-formoterol Inhale 2 puffs into the lungs 2 (two) times daily.   vitamin B-12 500 MCG tablet Commonly known as: CYANOCOBALAMIN Take 500 mcg by mouth daily.   vitamin C 1000 MG tablet Take 1,000 mg by mouth daily.       The results of significant diagnostics from this hospitalization (including imaging, microbiology, ancillary and laboratory) are listed below for reference.    Significant Diagnostic Studies: DG Lumbar Spine Complete W/Bend  Result Date: 06/30/2020 CLINICAL DATA:  Low back pain.  History of sickle cell disease. EXAM: LUMBAR  SPINE - COMPLETE WITH BENDING VIEWS COMPARISON:  Lumbar spine radiographs dated 04/09/2018. FINDINGS: There is no evidence of lumbar spine fracture. Alignment is normal. No spondylolisthesis on flexion and extension. Intervertebral disc spaces are maintained. IMPRESSION: Negative. Electronically Signed   By: Romona Curlsyler  Litton M.D.   On: 06/30/2020 08:43   DG Chest Port 1 View  Result Date: 07/11/2020 CLINICAL DATA:  Sickle cell disease.  COVID-19. EXAM: PORTABLE CHEST 1 VIEW COMPARISON:  04/07/2020 FINDINGS: There is mild cardiomegaly with enlargement of the pulmonary arteries, unchanged. No focal airspace consolidation or pulmonary edema. Pleural spaces are normal. IMPRESSION: Unchanged mild cardiomegaly without focal airspace disease. Electronically Signed   By: Deatra RobinsonKevin  Herman M.D.   On: 07/11/2020 00:28    Microbiology: Recent Results (from the past 240 hour(s))  Blood Culture (routine x 2)     Status: None (Preliminary result)   Collection Time: 07/11/20  3:15 AM   Specimen: BLOOD RIGHT FOREARM  Result Value Ref Range Status   Specimen Description   Final    BLOOD RIGHT FOREARM Performed at [facility], [address] [place], [place] [address]    Special Requests   Final    BOTTLES DRAWN AEROBIC AND ANAEROBIC Blood Culture results may not be optimal due to an excessive volume of blood received in culture bottles Performed at [facility], [address] [place], [place] [address]    Culture   Final    NO GROWTH 3 DAYS Performed at [facility], [address] [place], [place] [address]    Report Status PENDING  Incomplete  Blood Culture (routine x 2)     Status: None (Preliminary result)   Collection Time: 07/11/20  3:17 AM   Specimen: BLOOD RIGHT FOREARM  Result Value Ref Range Status   Specimen Description   Final    BLOOD RIGHT FOREARM Performed at [facility], [address] [place], [place] [address]    Special Requests   Final     BOTTLES DRAWN AEROBIC AND ANAEROBIC Blood Culture results may not be optimal due to an excessive volume of blood received in culture bottles Performed at [facility], [address] [place], [place] [address]    Culture   Final    NO GROWTH 3 DAYS Performed at [facility], [address] [place], [place] [address]    Report Status PENDING  Incomplete     Labs: Basic Metabolic Panel: Recent Labs  Lab 07/11/20 0016 07/11/20 0016 07/12/20 0556 07/12/20 0556 07/13/20 0425 07/14/20 0727  NA 144  --  138  --  136 139  K 4.2   < > 4.2   < > 4.2 4.7  CL 112*  --  111  --  110 113*  CO2 20*  --  18*  --  18* 18*  GLUCOSE 117*  --  94  --  99 95  BUN 30*  --  24*  --  21* 27*  CREATININE 2.64*  --  2.04*  --  1.94* 2.24*  CALCIUM 9.5  --  8.9  --  8.5* 8.9   < > = values in this interval not displayed.   Liver Function Tests: Recent Labs  Lab 07/11/20 0016 07/12/20 0556  AST 30 28  ALT 21 18  ALKPHOS 91 84  BILITOT 1.2 1.3*  PROT 7.7 7.0  ALBUMIN 3.8 3.5   No results for input(s): LIPASE, AMYLASE in the last 168 hours. No results for input(s): AMMONIA in the last 168 hours. CBC: Recent Labs  Lab 07/11/20 0016 07/11/20 1612 07/12/20 0556 07/13/20 0425 07/14/20 0727  WBC 5.8  --  6.1 7.3 14.8*  NEUTROABS 2.7  --  4.7 5.4  --   HGB 5.6* 7.4* 6.5* 7.8* 8.8*  HCT 15.3* 21.3* 19.2* 22.9* 26.5*  MCV 93.3  --  90.1 89.8 93.6  PLT 456*  --  382 384 420*   Cardiac Enzymes: No results for input(s): CKTOTAL, CKMB, CKMBINDEX, TROPONINI in the last 168 hours. BNP: Invalid input(s): POCBNP CBG: No results for input(s): GLUCAP in the last 168 hours.  Time coordinating discharge: 35 minutes  Signed:  Nolon Nations  APRN, MSN, FNP-C [facility] [address] [place], [place] [address] [phone] [facility] 07/14/2020, 5:30 PM

## 2020-07-14 NOTE — Progress Notes (Signed)
New orders to administer 0.9% NS Bolus 500 mL. Will continue to monitor the patient.

## 2020-07-15 LAB — TYPE AND SCREEN
ABO/RH(D): B POS
Antibody Screen: NEGATIVE
Donor AG Type: NEGATIVE
Donor AG Type: NEGATIVE
Donor AG Type: NEGATIVE
Donor AG Type: NEGATIVE
Unit division: 0
Unit division: 0
Unit division: 0
Unit division: 0

## 2020-07-15 LAB — BPAM RBC
Blood Product Expiration Date: 202111172359
Blood Product Expiration Date: 202111202359
Blood Product Expiration Date: 202111212359
Blood Product Expiration Date: 202111242359
ISSUE DATE / TIME: 202110160442
ISSUE DATE / TIME: 202110160901
ISSUE DATE / TIME: 202110172131
Unit Type and Rh: 1700
Unit Type and Rh: 5100
Unit Type and Rh: 5100
Unit Type and Rh: 7300

## 2020-07-16 LAB — CULTURE, BLOOD (ROUTINE X 2)
Culture: NO GROWTH
Culture: NO GROWTH

## 2020-07-17 ENCOUNTER — Other Ambulatory Visit: Payer: Medicare HMO

## 2020-07-17 ENCOUNTER — Other Ambulatory Visit: Payer: Self-pay

## 2020-07-17 DIAGNOSIS — Z20822 Contact with and (suspected) exposure to covid-19: Secondary | ICD-10-CM

## 2020-07-18 LAB — NOVEL CORONAVIRUS, NAA: SARS-CoV-2, NAA: DETECTED — AB

## 2020-07-18 LAB — SARS-COV-2, NAA 2 DAY TAT

## 2020-07-20 ENCOUNTER — Telehealth: Payer: Self-pay | Admitting: Nurse Practitioner

## 2020-07-20 NOTE — Telephone Encounter (Signed)
Please make him aware that COV-19 is not treated with anbx. If he feels like he need tx he will have to go to the ER for infusion.

## 2020-07-21 ENCOUNTER — Telehealth: Payer: Self-pay

## 2020-08-14 NOTE — Telephone Encounter (Signed)
Pt staes thank you

## 2020-10-20 DIAGNOSIS — D571 Sickle-cell disease without crisis: Secondary | ICD-10-CM | POA: Diagnosis not present

## 2020-10-20 DIAGNOSIS — Z130 Encounter for screening for diseases of the blood and blood-forming organs and certain disorders involving the immune mechanism: Secondary | ICD-10-CM | POA: Diagnosis not present

## 2020-10-20 DIAGNOSIS — H04123 Dry eye syndrome of bilateral lacrimal glands: Secondary | ICD-10-CM | POA: Diagnosis not present

## 2020-10-20 DIAGNOSIS — H527 Unspecified disorder of refraction: Secondary | ICD-10-CM | POA: Diagnosis not present

## 2020-10-20 DIAGNOSIS — H40003 Preglaucoma, unspecified, bilateral: Secondary | ICD-10-CM | POA: Diagnosis not present

## 2020-10-20 DIAGNOSIS — Z79899 Other long term (current) drug therapy: Secondary | ICD-10-CM | POA: Diagnosis not present

## 2020-10-20 DIAGNOSIS — D869 Sarcoidosis, unspecified: Secondary | ICD-10-CM | POA: Diagnosis not present

## 2020-10-22 DIAGNOSIS — N1831 Chronic kidney disease, stage 3a: Secondary | ICD-10-CM | POA: Diagnosis not present

## 2020-10-22 DIAGNOSIS — T8089XA Other complications following infusion, transfusion and therapeutic injection, initial encounter: Secondary | ICD-10-CM | POA: Diagnosis not present

## 2020-10-22 DIAGNOSIS — D571 Sickle-cell disease without crisis: Secondary | ICD-10-CM | POA: Diagnosis not present

## 2020-10-22 DIAGNOSIS — G8929 Other chronic pain: Secondary | ICD-10-CM | POA: Diagnosis not present

## 2020-10-22 DIAGNOSIS — E559 Vitamin D deficiency, unspecified: Secondary | ICD-10-CM | POA: Diagnosis not present

## 2020-10-23 DIAGNOSIS — K219 Gastro-esophageal reflux disease without esophagitis: Secondary | ICD-10-CM | POA: Diagnosis not present

## 2020-10-23 DIAGNOSIS — M545 Low back pain, unspecified: Secondary | ICD-10-CM | POA: Diagnosis not present

## 2020-10-23 DIAGNOSIS — D869 Sarcoidosis, unspecified: Secondary | ICD-10-CM | POA: Diagnosis not present

## 2020-10-23 DIAGNOSIS — N529 Male erectile dysfunction, unspecified: Secondary | ICD-10-CM | POA: Diagnosis not present

## 2020-10-23 DIAGNOSIS — D571 Sickle-cell disease without crisis: Secondary | ICD-10-CM | POA: Diagnosis not present

## 2020-10-23 DIAGNOSIS — G8929 Other chronic pain: Secondary | ICD-10-CM | POA: Diagnosis not present

## 2020-10-23 DIAGNOSIS — I499 Cardiac arrhythmia, unspecified: Secondary | ICD-10-CM | POA: Diagnosis not present

## 2020-10-23 DIAGNOSIS — R69 Illness, unspecified: Secondary | ICD-10-CM | POA: Diagnosis not present

## 2020-10-23 DIAGNOSIS — J449 Chronic obstructive pulmonary disease, unspecified: Secondary | ICD-10-CM | POA: Diagnosis not present

## 2020-10-23 DIAGNOSIS — Z008 Encounter for other general examination: Secondary | ICD-10-CM | POA: Diagnosis not present

## 2020-10-23 DIAGNOSIS — K5900 Constipation, unspecified: Secondary | ICD-10-CM | POA: Diagnosis not present

## 2020-11-19 DIAGNOSIS — E8809 Other disorders of plasma-protein metabolism, not elsewhere classified: Secondary | ICD-10-CM | POA: Diagnosis not present

## 2020-11-19 DIAGNOSIS — N1832 Chronic kidney disease, stage 3b: Secondary | ICD-10-CM | POA: Diagnosis not present

## 2020-11-19 DIAGNOSIS — D571 Sickle-cell disease without crisis: Secondary | ICD-10-CM | POA: Diagnosis not present

## 2020-11-19 DIAGNOSIS — R809 Proteinuria, unspecified: Secondary | ICD-10-CM | POA: Diagnosis not present

## 2020-11-19 DIAGNOSIS — N159 Renal tubulo-interstitial disease, unspecified: Secondary | ICD-10-CM | POA: Diagnosis not present

## 2020-11-19 DIAGNOSIS — N529 Male erectile dysfunction, unspecified: Secondary | ICD-10-CM | POA: Diagnosis not present

## 2020-11-19 DIAGNOSIS — D649 Anemia, unspecified: Secondary | ICD-10-CM | POA: Diagnosis not present

## 2020-11-19 DIAGNOSIS — N1831 Chronic kidney disease, stage 3a: Secondary | ICD-10-CM | POA: Diagnosis not present

## 2020-11-19 DIAGNOSIS — Z8616 Personal history of COVID-19: Secondary | ICD-10-CM | POA: Diagnosis not present

## 2020-11-25 ENCOUNTER — Encounter: Payer: Self-pay | Admitting: Nurse Practitioner

## 2020-11-25 ENCOUNTER — Other Ambulatory Visit: Payer: Self-pay

## 2020-11-25 ENCOUNTER — Ambulatory Visit (INDEPENDENT_AMBULATORY_CARE_PROVIDER_SITE_OTHER): Payer: Medicare HMO | Admitting: Nurse Practitioner

## 2020-11-25 VITALS — BP 122/78 | HR 89 | Temp 100.2°F | Ht 72.0 in | Wt 127.0 lb

## 2020-11-25 DIAGNOSIS — D571 Sickle-cell disease without crisis: Secondary | ICD-10-CM | POA: Diagnosis not present

## 2020-11-25 DIAGNOSIS — R059 Cough, unspecified: Secondary | ICD-10-CM

## 2020-11-25 DIAGNOSIS — D869 Sarcoidosis, unspecified: Secondary | ICD-10-CM

## 2020-11-25 DIAGNOSIS — Z8616 Personal history of COVID-19: Secondary | ICD-10-CM

## 2020-11-25 MED ORDER — BENZONATATE 100 MG PO CAPS: 100.0000 mg | ORAL_CAPSULE | Freq: Three times a day (TID) | ORAL | 1 refills | Status: AC | PRN

## 2020-11-25 NOTE — Progress Notes (Signed)
[name] [address] [place], [place]  [address] Phone:  [phone]   Fax:  [phone]   Established Patient Office Visit  Subjective:  Patient ID: Shawn Adams, male    DOB: [birth year]  Age: 45 y.o. MRN: [id number]  CC:  Chief Complaint  Patient presents with  . Cough  . Follow-up    HPI Shawn Adams presents for cough and follow up. He  has a past medical history of Aneurysm (HCC), Heart murmur, Sarcoid, and Sickle cell anemia (HCC).   Cough Patient complains of productive cough. Symptoms began 1 month ago. Symptoms have been unchanged since that time.The cough is productive of yellow and green sputum and is aggravated by nothing. Associated symptoms include: chest pain. Patient does not have new pets. Patient does have a history of asthma. Patient does not have a history of environmental allergens. Patient has not traveled recently. Patient does not have a history of smoking. Patient has not had a previous chest x-ray.    Past Medical History:  Diagnosis Date  . Aneurysm (HCC)    Pt states happened in 2011, Brain   . Heart murmur   . Sarcoid   . Sickle cell anemia (HCC)     Past Surgical History:  Procedure Laterality Date  . LYMPH NODE BIOPSY    . NASAL SINUS SURGERY      Family History  Problem Relation Age of Onset  . Diabetes Mother   . Hypertension Father   . Lupus Sister     Social History   Socioeconomic History  . Marital status: Single    Spouse name: Not on file  . Number of children: Not on file  . Years of education: Not on file  . Highest education level: Not on file  Occupational History  . Not on file  Tobacco Use  . Smoking status: Former Smoker    Types: Cigarettes  . Smokeless tobacco: Never Used  Vaping Use  . Vaping Use: Never used  Substance and Sexual Activity  . Alcohol use: Not Currently    Alcohol/week: 1.0 standard drink    Types: 1 Cans of beer per week    Comment: occasionally  . Drug use:  Not Currently    Types: Marijuana  . Sexual activity: Yes    Birth control/protection: None  Other Topics Concern  . Not on file  Social History Narrative  . Not on file   Social Determinants of Health   Financial Resource Strain: Not on file  Food Insecurity: Not on file  Transportation Needs: Not on file  Physical Activity: Not on file  Stress: Not on file  Social Connections: Not on file  Intimate Partner Violence: Not on file    Outpatient Medications Prior to Visit  Medication Sig Dispense Refill  . acetaminophen (TYLENOL) 500 MG tablet Take 500 mg by mouth every 6 (six) hours as needed for moderate pain or fever (pain).     Marland Kitchen albuterol (VENTOLIN HFA) 108 (90 Base) MCG/ACT inhaler Inhale into the lungs.    . Ascorbic Acid (VITAMIN C) 1000 MG tablet Take 1,000 mg by mouth daily.    . budesonide-formoterol (SYMBICORT) 160-4.5 MCG/ACT inhaler Inhale 2 puffs into the lungs 2 (two) times daily.    . cyanocobalamin 50 MCG tablet Take by mouth.    . cyclobenzaprine (FLEXERIL) 10 MG tablet Take 1 tablet (10 mg total) by mouth 3 (three) times daily as needed for muscle spasms. 15 tablet 0  .  docusate sodium (COLACE) 100 MG capsule Take 100 mg by mouth daily as needed for mild constipation.    . ergocalciferol (VITAMIN D2) 1.25 MG (50000 UT) capsule Take 50,000 Units by mouth every 30 (thirty) days.     . famotidine (PEPCID) 10 MG tablet Take 10 mg by mouth daily as needed for heartburn or indigestion.    . folic acid (FOLVITE) 1 MG tablet Take 1 tablet (1 mg total) by mouth daily. 90 tablet 3  . hydroxychloroquine (PLAQUENIL) 200 MG tablet Take 200 mg by mouth daily.    . hydroxyurea (HYDREA) 500 MG capsule Take 2 capsules (1,000 mg total) by mouth daily. May take with food to minimize GI side effects. 180 capsule 3  . INCRUSE ELLIPTA 62.5 MCG/INH AEPB Inhale 1 puff into the lungs daily.    Marland Kitchen levofloxacin (LEVAQUIN) 750 MG tablet Take 750 mg by mouth daily.     . Melatonin 10 MG TABS  Take 10 mg by mouth at bedtime.    . ondansetron (ZOFRAN) 4 MG tablet Take 1 tablet (4 mg total) by mouth every 6 (six) hours. (Patient taking differently: Take 4 mg by mouth every 8 (eight) hours as needed for nausea or vomiting. ) 12 tablet 0  . OXBRYTA 500 MG TABS tablet Take 1,500 mg by mouth.     . Oxycodone HCl 20 MG TABS Take 20 mg by mouth every 4 (four) hours as needed (pain).     . predniSONE (DELTASONE) 10 MG tablet Take 40 mg by mouth daily.  (Patient not taking: Reported on 06/29/2020)    . sodium bicarbonate 650 MG tablet Take 1,300 mg by mouth daily.     . SYMBICORT 80-4.5 MCG/ACT inhaler Inhale into the lungs.    . vitamin B-12 (CYANOCOBALAMIN) 500 MCG tablet Take 500 mcg by mouth daily.     No facility-administered medications prior to visit.    No Known Allergies  ROS Review of Systems    Objective:    Physical Exam Constitutional:      General: He is not in acute distress.    Appearance: He is not ill-appearing, toxic-appearing or diaphoretic.  HENT:     Head: Normocephalic and atraumatic.     Nose: Nose normal.     Mouth/Throat:     Mouth: Mucous membranes are moist.  Cardiovascular:     Rate and Rhythm: Normal rate and regular rhythm.     Pulses: Normal pulses.     Heart sounds: Normal heart sounds.  Pulmonary:     Effort: Pulmonary effort is normal.     Breath sounds: Normal breath sounds.  Abdominal:     Palpations: Abdomen is soft.  Musculoskeletal:        General: Normal range of motion.     Right lower leg: No edema.     Left lower leg: No edema.  Skin:    General: Skin is warm and dry.     Capillary Refill: Capillary refill takes less than 2 seconds.  Neurological:     General: No focal deficit present.     Mental Status: He is alert and oriented to person, place, and time.  Psychiatric:        Mood and Affect: Mood normal.        Behavior: Behavior normal.        Thought Content: Thought content normal.        Judgment: Judgment normal.      BP 122/78 (BP Location: Right Arm,  Patient Position: Sitting, Cuff Size: Normal)   Pulse 89   Temp 100.2 F (37.9 C)   Ht 6' (1.829 m)   Wt 127 lb (57.6 kg)   BMI 17.22 kg/m  Wt Readings from Last 3 Encounters:  11/25/20 127 lb (57.6 kg)  07/10/20 132 lb (59.9 kg)  06/29/20 132 lb (59.9 kg)     Health Maintenance Due  Topic Date Due  . Hepatitis C Screening  Never done  . Meningococcal B Vaccine (1 of 4 - Increased Risk Bexsero 2-dose series) Never done  . COVID-19 Vaccine (1) Never done    There are no preventive care reminders to display for this patient.  No results found for: TSH Lab Results  Component Value Date   WBC 4.8 11/25/2020   HGB 7.8 (L) 11/25/2020   HCT 21.4 (L) 11/25/2020   MCV 115 (H) 11/25/2020   PLT 324 11/25/2020   Lab Results  Component Value Date   NA 139 07/14/2020   K 4.7 07/14/2020   CO2 18 (L) 07/14/2020   GLUCOSE 95 07/14/2020   BUN 27 (H) 07/14/2020   CREATININE 2.24 (H) 07/14/2020   BILITOT 1.3 (H) 07/12/2020   ALKPHOS 84 07/12/2020   AST 28 07/12/2020   ALT 18 07/12/2020   PROT 7.0 07/12/2020   ALBUMIN 3.5 07/12/2020   CALCIUM 8.9 07/14/2020   ANIONGAP 8 07/14/2020   No results found for: CHOL No results found for: HDL No results found for: [facility] Lab Results  Component Value Date   TRIG 182 (H) 07/11/2020   No results found for: CHOLHDL No results found for: ZOXW9U    Assessment & Plan:   Problem List Items Addressed This Visit      Other   Sarcoidosis   Hb-SS disease without crisis (HCC)   Relevant Medications   cyanocobalamin 50 MCG tablet    Other Visit Diagnoses    Cough    -  Primary   Relevant Orders   DG Chest 2 View   CBC with Differential/Platelet (Completed)   Personal history of COVID-19       Relevant Orders   DG Chest 2 View   CBC with Differential/Platelet (Completed)      Meds ordered this encounter  Medications  . benzonatate (TESSALON) 100 MG capsule    Sig: Take 1 capsule  (100 mg total) by mouth 3 (three) times daily as needed for up to 10 days for cough. Never suck or chew on a benzonatate capsule.    Dispense:  30 capsule    Refill:  1    Do not place medication on "Automatic Refill". Please provide patient with medication counseling and recommendations.    Order Specific Question:   Supervising Provider    Answer:   Quentin Angst [[id number]]    Follow-up: Return in about 3 months (around 02/25/2021) for Follow up SCD 11914.    Barbette Merino, NP

## 2020-11-25 NOTE — Patient Instructions (Signed)

## 2020-11-26 LAB — CBC WITH DIFFERENTIAL/PLATELET
Basophils Absolute: 0.1 10*3/uL (ref 0.0–0.2)
Basos: 1 %
EOS (ABSOLUTE): 0.2 10*3/uL (ref 0.0–0.4)
Eos: 4 %
Hematocrit: 21.4 % — ABNORMAL LOW (ref 37.5–51.0)
Hemoglobin: 7.8 g/dL — ABNORMAL LOW (ref 13.0–17.7)
Immature Grans (Abs): 0 10*3/uL (ref 0.0–0.1)
Immature Granulocytes: 1 %
Lymphocytes Absolute: 1.2 10*3/uL (ref 0.7–3.1)
Lymphs: 25 %
MCH: 41.9 pg — ABNORMAL HIGH (ref 26.6–33.0)
MCHC: 36.4 g/dL — ABNORMAL HIGH (ref 31.5–35.7)
MCV: 115 fL — ABNORMAL HIGH (ref 79–97)
Monocytes Absolute: 0.4 10*3/uL (ref 0.1–0.9)
Monocytes: 9 %
NRBC: 10 % — ABNORMAL HIGH (ref 0–0)
Neutrophils Absolute: 2.9 10*3/uL (ref 1.4–7.0)
Neutrophils: 60 %
Platelets: 324 10*3/uL (ref 150–450)
RBC: 1.86 x10E6/uL — CL (ref 4.14–5.80)
RDW: 17.9 % — ABNORMAL HIGH (ref 11.6–15.4)
WBC: 4.8 10*3/uL (ref 3.4–10.8)

## 2020-11-30 ENCOUNTER — Other Ambulatory Visit: Payer: Self-pay | Admitting: Nurse Practitioner

## 2020-11-30 DIAGNOSIS — D638 Anemia in other chronic diseases classified elsewhere: Secondary | ICD-10-CM

## 2020-11-30 DIAGNOSIS — D571 Sickle-cell disease without crisis: Secondary | ICD-10-CM

## 2020-12-04 ENCOUNTER — Telehealth: Payer: Self-pay

## 2020-12-04 NOTE — Telephone Encounter (Signed)
Pt states to send him to get x-rays per last visit.? He said provider knows

## 2020-12-07 ENCOUNTER — Other Ambulatory Visit: Payer: Self-pay

## 2020-12-07 ENCOUNTER — Ambulatory Visit
Admission: RE | Admit: 2020-12-07 | Discharge: 2020-12-07 | Disposition: A | Discharge status: Home / Self Care | Payer: Medicare HMO | Source: Ambulatory Visit | Attending: Nurse Practitioner | Admitting: Nurse Practitioner

## 2020-12-07 ENCOUNTER — Ambulatory Visit (INDEPENDENT_AMBULATORY_CARE_PROVIDER_SITE_OTHER): Payer: Medicare HMO | Admitting: Nurse Practitioner

## 2020-12-07 VITALS — BP 124/65 | HR 83 | Temp 97.9°F | Resp 18

## 2020-12-07 DIAGNOSIS — J4540 Moderate persistent asthma, uncomplicated: Secondary | ICD-10-CM

## 2020-12-07 DIAGNOSIS — R059 Cough, unspecified: Secondary | ICD-10-CM | POA: Diagnosis not present

## 2020-12-07 DIAGNOSIS — Z8616 Personal history of COVID-19: Secondary | ICD-10-CM | POA: Diagnosis not present

## 2020-12-07 DIAGNOSIS — J449 Chronic obstructive pulmonary disease, unspecified: Secondary | ICD-10-CM | POA: Diagnosis not present

## 2020-12-07 MED ORDER — MONTELUKAST SODIUM 10 MG PO TABS: 10.0000 mg | ORAL_TABLET | Freq: Every day | ORAL | 3 refills | Status: DC

## 2020-12-07 MED ORDER — PREDNISONE 10 MG PO TABS: ORAL_TABLET | ORAL | 0 refills | Status: DC

## 2020-12-07 NOTE — Progress Notes (Signed)
@Patient  ID: , male    DOB: 1976/08/03, 45 y.o.   MRN: [id number]  Chief Complaint  Patient presents with  . history of covid    Patient states that he had covid in October 2021. He recovered, but developed new onset of cough 1 month ago. Does have history of asthma.     Referring provider: November 2021, NP     HPI  Patient presents today for post COVID care clinic visit.  Patient states that he has a positive for Covid in October 2021.  He states that he did fully recover at that time.  He then developed a new onset of cough and February 2022.  He states that his cough continues to persist.  He was seen by his PCP last week and was ordered a chest x-ray and Tessalon Perles.  Patient did not complete chest x-ray.  He states that his cough is productive at times of yellow sputum patient does have a history of asthma and is currently on Symbicort, Incruse, albuterol.  Patient does see asthma and allergy specialist and [facility].  We discussed that he may need a referral to pulmonary for a PFT and further evaluation.    No Known Allergies  Immunization History  Administered Date(s) Administered  . Influenza,inj,Quad PF,6+ Mos 06/21/2014, 07/08/2015  . Influenza,inj,quad, With Preservative 05/11/2016  . Influenza-Unspecified 07/10/2018  . Tdap 07/08/2015    Past Medical History:  Diagnosis Date  . Aneurysm (HCC)    Pt states happened in 2011, Brain   . Heart murmur   . Sarcoid   . Sickle cell anemia (HCC)     Tobacco History: Social History   Tobacco Use  Smoking Status Former Smoker  . Types: Cigarettes  Smokeless Tobacco Never Used   Counseling given: Yes   Outpatient Encounter Medications as of 12/07/2020  Medication Sig  . montelukast (SINGULAIR) 10 MG tablet Take 1 tablet (10 mg total) by mouth at bedtime.  . predniSONE (DELTASONE) 10 MG tablet Take 4 tabs for 2 days, then 3 tabs for 2 days, then 2 tabs for 2 days, then 1 tab for 2 days, then  stop  . acetaminophen (TYLENOL) 500 MG tablet Take 500 mg by mouth every 6 (six) hours as needed for moderate pain or fever (pain).   12/09/2020 albuterol (VENTOLIN HFA) 108 (90 Base) MCG/ACT inhaler Inhale into the lungs.  . Ascorbic Acid (VITAMIN C) 1000 MG tablet Take 1,000 mg by mouth daily.  . budesonide-formoterol (SYMBICORT) 160-4.5 MCG/ACT inhaler Inhale 2 puffs into the lungs 2 (two) times daily.  . cyanocobalamin 50 MCG tablet Take by mouth.  . cyclobenzaprine (FLEXERIL) 10 MG tablet Take 1 tablet (10 mg total) by mouth 3 (three) times daily as needed for muscle spasms.  Marland Kitchen docusate sodium (COLACE) 100 MG capsule Take 100 mg by mouth daily as needed for mild constipation.  . ergocalciferol (VITAMIN D2) 1.25 MG (50000 UT) capsule Take 50,000 Units by mouth every 30 (thirty) days.   . famotidine (PEPCID) 10 MG tablet Take 10 mg by mouth daily as needed for heartburn or indigestion.  . folic acid (FOLVITE) 1 MG tablet Take 1 tablet (1 mg total) by mouth daily.  . hydroxychloroquine (PLAQUENIL) 200 MG tablet Take 200 mg by mouth daily.  . hydroxyurea (HYDREA) 500 MG capsule Take 2 capsules (1,000 mg total) by mouth daily. May take with food to minimize GI side effects.  . INCRUSE ELLIPTA 62.5 MCG/INH AEPB Inhale 1 puff into  the lungs daily.  Marland Kitchen levofloxacin (LEVAQUIN) 750 MG tablet Take 750 mg by mouth daily.   . Melatonin 10 MG TABS Take 10 mg by mouth at bedtime.  . ondansetron (ZOFRAN) 4 MG tablet Take 1 tablet (4 mg total) by mouth every 6 (six) hours. (Patient taking differently: Take 4 mg by mouth every 8 (eight) hours as needed for nausea or vomiting. )  . OXBRYTA 500 MG TABS tablet Take 1,500 mg by mouth.   . Oxycodone HCl 20 MG TABS Take 20 mg by mouth every 4 (four) hours as needed (pain).   . predniSONE (DELTASONE) 10 MG tablet Take 40 mg by mouth daily.  (Patient not taking: Reported on 06/29/2020)  . sodium bicarbonate 650 MG tablet Take 1,300 mg by mouth daily.   . SYMBICORT 80-4.5  MCG/ACT inhaler Inhale into the lungs.  . vitamin B-12 (CYANOCOBALAMIN) 500 MCG tablet Take 500 mcg by mouth daily.   No facility-administered encounter medications on file as of 12/07/2020.     Review of Systems  Review of Systems  Constitutional: Negative.   HENT: Negative.   Respiratory: Positive for cough and shortness of breath.   Cardiovascular: Negative.  Negative for chest pain, palpitations and leg swelling.  Gastrointestinal: Negative.   Allergic/Immunologic: Negative.   Neurological: Negative.   Psychiatric/Behavioral: Negative.        Physical Exam  BP 124/65   Pulse 83   Temp 97.9 F (36.6 C)   Resp 18   SpO2 97% Comment: RA  Wt Readings from Last 5 Encounters:  11/25/20 127 lb (57.6 kg)  07/10/20 132 lb (59.9 kg)  06/29/20 132 lb (59.9 kg)  05/28/20 131 lb 6.4 oz (59.6 kg)  04/11/20 124 lb 9 oz (56.5 kg)     Physical Exam Vitals and nursing note reviewed.  Constitutional:      General: He is not in acute distress.    Appearance: He is well-developed.  Cardiovascular:     Rate and Rhythm: Normal rate and regular rhythm.  Pulmonary:     Effort: Pulmonary effort is normal.     Breath sounds: Normal breath sounds.  Musculoskeletal:     Right lower leg: No edema.     Left lower leg: No edema.  Skin:    General: Skin is warm and dry.  Neurological:     Mental Status: He is alert and oriented to person, place, and time.  Psychiatric:        Mood and Affect: Mood normal.        Behavior: Behavior normal.        Assessment & Plan:   Cough Cough:   Stay well hydrated  Stay active  Deep breathing exercises  May take tylenol or fever or pain    Will order chest x ray:  [facility] [address] [place], [place] [address] [phone] MON - FRI 8:00 AM - 4:00 PM - WALK IN    Will order Singulair  Will order prednisone  Will place referral to pulmonary - may need PFT    Follow up:  Follow up in 2 weeks or  sooner if needed      Ivonne Andrew, NP 12/07/2020

## 2020-12-07 NOTE — Patient Instructions (Addendum)
History of Covid 19 Cough:   Stay well hydrated  Stay active  Deep breathing exercises  May take tylenol or fever or pain    Will order chest x ray:  [facility] [address] [place], [place] [address] [phone] MON - FRI 8:00 AM - 4:00 PM - WALK IN    Will order Singulair  Will order prednisone  Will place referral to pulmonary - may need PFT    Follow up:  Follow up in 2 weeks or sooner if needed

## 2020-12-07 NOTE — Assessment & Plan Note (Signed)
Cough:   Stay well hydrated  Stay active  Deep breathing exercises  May take tylenol or fever or pain    Will order chest x ray:  [facility] [address] [place], [place] [address] [phone] MON - FRI 8:00 AM - 4:00 PM - WALK IN    Will order Singulair  Will order prednisone  Will place referral to pulmonary - may need PFT    Follow up:  Follow up in 2 weeks or sooner if needed

## 2020-12-16 ENCOUNTER — Encounter: Payer: Self-pay | Admitting: Nurse Practitioner

## 2020-12-16 ENCOUNTER — Ambulatory Visit (INDEPENDENT_AMBULATORY_CARE_PROVIDER_SITE_OTHER): Payer: Medicare HMO | Admitting: Nurse Practitioner

## 2020-12-16 ENCOUNTER — Other Ambulatory Visit: Payer: Self-pay

## 2020-12-16 VITALS — BP 129/72 | HR 67 | Temp 98.1°F | Ht 72.0 in | Wt 136.0 lb

## 2020-12-16 DIAGNOSIS — R69 Illness, unspecified: Secondary | ICD-10-CM | POA: Diagnosis not present

## 2020-12-16 DIAGNOSIS — Z8616 Personal history of COVID-19: Secondary | ICD-10-CM | POA: Diagnosis not present

## 2020-12-16 DIAGNOSIS — F32A Depression, unspecified: Secondary | ICD-10-CM | POA: Diagnosis not present

## 2020-12-16 DIAGNOSIS — R059 Cough, unspecified: Secondary | ICD-10-CM | POA: Diagnosis not present

## 2020-12-16 DIAGNOSIS — G4700 Insomnia, unspecified: Secondary | ICD-10-CM

## 2020-12-16 DIAGNOSIS — D571 Sickle-cell disease without crisis: Secondary | ICD-10-CM

## 2020-12-16 NOTE — Progress Notes (Signed)
[facility] Patient Shawn Adams [address] [place], [place]  [address] Phone:  [phone]   Fax:  [phone]   Established Patient Office Visit  Subjective:  Patient ID: Shawn Adams, male    DOB: Dec 15, 1975  Age: 45 y.o. MRN: [id number]  CC: No chief complaint on file.   HPI Shawn Adams presents for follow up. He  has a past medical history of Aneurysm (HCC), Heart murmur, Sarcoid, and Sickle cell anemia (HCC).   He complains of insomnia. Onset was several months ago. Patient describes symptoms as frequent night time awakening. Patient has found minimal relief with melatonin use. Associated symptoms include: daytime somnolence. Patient denies anxiety, depression, frequent nighttime urination, irritability, leg cramps and restless legs. Symptoms have progressed to a point and plateaued.  He admits that he continues to have a cough post Covid.   He has been vaccinated. He feels like the cough is improving.  He does have some long-hauler symptoms.  He admits it as ongoing pain that is undescribable.      He is followed by nephrology and hematology.  He admits that he did see the nephrologist recently and states that his creatinine was 1.6.  These goal is creatinine less than 1.5.  Denies fever, headache, wheezing, shortness of breath, chest pains, abdominal pain, back pain, hip pain, or leg pain. Denies any open wounds, skin irritation.   Past Medical History:  Diagnosis Date  . Aneurysm (HCC)    Pt states happened in 2011, Brain   . Heart murmur   . Sarcoid   . Sickle cell anemia (HCC)     Past Surgical History:  Procedure Laterality Date  . LYMPH NODE BIOPSY    . NASAL SINUS SURGERY      Family History  Problem Relation Age of Onset  . Diabetes Mother   . Hypertension Father   . Lupus Sister     Social History   Socioeconomic History  . Marital status: Single    Spouse name: Not on file  . Number of children: Not on file  . Years of education: Not on file   . Highest education level: Not on file  Occupational History  . Not on file  Tobacco Use  . Smoking status: Former Smoker    Types: Cigarettes  . Smokeless tobacco: Never Used  Vaping Use  . Vaping Use: Never used  Substance and Sexual Activity  . Alcohol use: Not Currently    Alcohol/week: 1.0 standard drink    Types: 1 Cans of beer per week    Comment: occasionally  . Drug use: Not Currently    Types: Marijuana  . Sexual activity: Yes    Birth control/protection: None  Other Topics Concern  . Not on file  Social History Narrative  . Not on file   Social Determinants of Health   Financial Resource Strain: Not on file  Food Insecurity: Not on file  Transportation Needs: Not on file  Physical Activity: Not on file  Stress: Not on file  Social Connections: Not on file  Intimate Partner Violence: Not on file    Outpatient Medications Prior to Visit  Medication Sig Dispense Refill  . acetaminophen (TYLENOL) 500 MG tablet Take 500 mg by mouth every 6 (six) hours as needed for moderate pain or fever (pain).     Marland Kitchen albuterol (VENTOLIN HFA) 108 (90 Base) MCG/ACT inhaler Inhale into the lungs.    . Ascorbic Acid (VITAMIN C) 1000 MG tablet Take  1,000 mg by mouth daily.    . budesonide-formoterol (SYMBICORT) 160-4.5 MCG/ACT inhaler Inhale 2 puffs into the lungs 2 (two) times daily.    . cyanocobalamin 50 MCG tablet Take by mouth.    . cyclobenzaprine (FLEXERIL) 10 MG tablet Take 1 tablet (10 mg total) by mouth 3 (three) times daily as needed for muscle spasms. 15 tablet 0  . docusate sodium (COLACE) 100 MG capsule Take 100 mg by mouth daily as needed for mild constipation.    . ergocalciferol (VITAMIN D2) 1.25 MG (50000 UT) capsule Take 50,000 Units by mouth every 30 (thirty) days.     . famotidine (PEPCID) 10 MG tablet Take 10 mg by mouth daily as needed for heartburn or indigestion.    . folic acid (FOLVITE) 1 MG tablet Take 1 tablet (1 mg total) by mouth daily. 90 tablet 3  .  hydroxychloroquine (PLAQUENIL) 200 MG tablet Take 200 mg by mouth daily.    . hydroxyurea (HYDREA) 500 MG capsule Take 2 capsules (1,000 mg total) by mouth daily. May take with food to minimize GI side effects. 180 capsule 3  . INCRUSE ELLIPTA 62.5 MCG/INH AEPB Inhale 1 puff into the lungs daily.    Marland Kitchen levofloxacin (LEVAQUIN) 750 MG tablet Take 750 mg by mouth daily.     . Melatonin 10 MG TABS Take 10 mg by mouth at bedtime.    . montelukast (SINGULAIR) 10 MG tablet Take 1 tablet (10 mg total) by mouth at bedtime. 30 tablet 3  . ondansetron (ZOFRAN) 4 MG tablet Take 1 tablet (4 mg total) by mouth every 6 (six) hours. (Patient taking differently: Take 4 mg by mouth every 8 (eight) hours as needed for nausea or vomiting. ) 12 tablet 0  . OXBRYTA 500 MG TABS tablet Take 1,500 mg by mouth.     . Oxycodone HCl 20 MG TABS Take 20 mg by mouth every 4 (four) hours as needed (pain).     . predniSONE (DELTASONE) 10 MG tablet Take 40 mg by mouth daily.  (Patient not taking: Reported on 06/29/2020)    . predniSONE (DELTASONE) 10 MG tablet Take 4 tabs for 2 days, then 3 tabs for 2 days, then 2 tabs for 2 days, then 1 tab for 2 days, then stop 20 tablet 0  . sodium bicarbonate 650 MG tablet Take 1,300 mg by mouth daily.     . SYMBICORT 80-4.5 MCG/ACT inhaler Inhale into the lungs.    . vitamin B-12 (CYANOCOBALAMIN) 500 MCG tablet Take 500 mcg by mouth daily.     No facility-administered medications prior to visit.    No Known Allergies  ROS Review of Systems    Objective:    Physical Exam Constitutional:      General: He is not in acute distress.    Appearance: He is normal weight. He is not ill-appearing, toxic-appearing or diaphoretic.  HENT:     Head: Normocephalic and atraumatic.  Cardiovascular:     Rate and Rhythm: Normal rate and regular rhythm.     Pulses: Normal pulses.  Pulmonary:     Effort: Pulmonary effort is normal.     Breath sounds: Normal breath sounds.  Musculoskeletal:      Cervical back: Normal range of motion.     Right lower leg: No edema.     Left lower leg: No edema.  Skin:    General: Skin is warm and dry.     Capillary Refill: Capillary refill takes less than 2  seconds.  Neurological:     General: No focal deficit present.     Mental Status: He is alert and oriented to person, place, and time.  Psychiatric:        Mood and Affect: Mood normal.        Behavior: Behavior normal.        Thought Content: Thought content normal.        Judgment: Judgment normal.     BP 129/72 (BP Location: Right Arm, Patient Position: Sitting, Cuff Size: Normal)   Pulse 67   Temp 98.1 F (36.7 C)   Ht 6' (1.829 m)   Wt 136 lb (61.7 kg)   SpO2 97%   BMI 18.44 kg/m  Wt Readings from Last 3 Encounters:  12/16/20 136 lb (61.7 kg)  11/25/20 127 lb (57.6 kg)  07/10/20 132 lb (59.9 kg)     Health Maintenance Due  Topic Date Due  . Hepatitis C Screening  Never done  . Meningococcal B Vaccine (1 of 4 - Increased Risk Bexsero 2-dose series) Never done  . COVID-19 Vaccine (3 - Booster) 06/30/2020    There are no preventive care reminders to display for this patient.  No results found for: TSH Lab Results  Component Value Date   WBC 4.8 11/25/2020   HGB 7.8 (L) 11/25/2020   HCT 21.4 (L) 11/25/2020   MCV 115 (H) 11/25/2020   PLT 324 11/25/2020   Lab Results  Component Value Date   NA 139 07/14/2020   K 4.7 07/14/2020   CO2 18 (L) 07/14/2020   GLUCOSE 95 07/14/2020   BUN 27 (H) 07/14/2020   CREATININE 2.24 (H) 07/14/2020   BILITOT 1.3 (H) 07/12/2020   ALKPHOS 84 07/12/2020   AST 28 07/12/2020   ALT 18 07/12/2020   PROT 7.0 07/12/2020   ALBUMIN 3.5 07/12/2020   CALCIUM 8.9 07/14/2020   ANIONGAP 8 07/14/2020   No results found for: CHOL No results found for: HDL No results found for: [facility] Lab Results  Component Value Date   TRIG 182 (H) 07/11/2020   No results found for: CHOLHDL No results found for: HCWC3J    Assessment & Plan:    Problem List Items Addressed This Visit      Other   Cough Improving we will continue to monitor   Hb-SS disease without crisis (HCC) - Primary Stable continue to follow-up with hematologist as scheduled   Relevant Orders   Sickle Cell Panel    Other Visit Diagnoses    Insomnia, unspecified type     Persistent we will increase melatonin to 20 mg nightly We discussed at length sleep hygiene measures including regular sleep schedule, optimal sleep environment, and relaxing presleep rituals. Avoid daytime naps. Avoid caffeine after noon. Avoid excess alcohol. Avoid tobacco. Recommended daily exercise    Depression, unspecified depression type     Stable no current counseling   Personal history of COVID-19     Long-hauler symptoms noted will follow up with post Covid clinic as needed      No orders of the defined types were placed in this encounter.   Follow-up: Return in about 3 months (around 03/18/2021).    Barbette Merino, NP

## 2020-12-16 NOTE — Patient Instructions (Signed)

## 2020-12-17 LAB — CMP14+CBC/D/PLT+FER+RETIC+V...
ALT: 45 IU/L — ABNORMAL HIGH (ref 0–44)
AST: 41 IU/L — ABNORMAL HIGH (ref 0–40)
Albumin/Globulin Ratio: 1.2 (ref 1.2–2.2)
Albumin: 4.1 g/dL (ref 4.0–5.0)
Alkaline Phosphatase: 124 IU/L — ABNORMAL HIGH (ref 44–121)
BUN/Creatinine Ratio: 16 (ref 9–20)
BUN: 31 mg/dL — ABNORMAL HIGH (ref 6–24)
Basophils Absolute: 0 10*3/uL (ref 0.0–0.2)
Basos: 0 %
Bilirubin Total: 0.9 mg/dL (ref 0.0–1.2)
CO2: 16 mmol/L — ABNORMAL LOW (ref 20–29)
Calcium: 9.5 mg/dL (ref 8.7–10.2)
Chloride: 105 mmol/L (ref 96–106)
Creatinine, Ser: 1.97 mg/dL — ABNORMAL HIGH (ref 0.76–1.27)
EOS (ABSOLUTE): 0 10*3/uL (ref 0.0–0.4)
Eos: 1 %
Ferritin: 3530 ng/mL — ABNORMAL HIGH (ref 30–400)
Globulin, Total: 3.4 g/dL (ref 1.5–4.5)
Glucose: 110 mg/dL — ABNORMAL HIGH (ref 65–99)
Hematocrit: 23.3 % — ABNORMAL LOW (ref 37.5–51.0)
Hemoglobin: 8.5 g/dL — ABNORMAL LOW (ref 13.0–17.7)
Immature Grans (Abs): 0.1 10*3/uL (ref 0.0–0.1)
Immature Granulocytes: 1 %
Lymphocytes Absolute: 0.9 10*3/uL (ref 0.7–3.1)
Lymphs: 19 %
MCH: 43.1 pg — ABNORMAL HIGH (ref 26.6–33.0)
MCHC: 36.5 g/dL — ABNORMAL HIGH (ref 31.5–35.7)
MCV: 118 fL — ABNORMAL HIGH (ref 79–97)
Monocytes Absolute: 0.7 10*3/uL (ref 0.1–0.9)
Monocytes: 16 %
NRBC: 20 % — ABNORMAL HIGH (ref 0–0)
Neutrophils Absolute: 2.8 10*3/uL (ref 1.4–7.0)
Neutrophils: 63 %
Platelets: 371 10*3/uL (ref 150–450)
Potassium: 4.3 mmol/L (ref 3.5–5.2)
RBC: 1.97 x10E6/uL — CL (ref 4.14–5.80)
RDW: 16.3 % — ABNORMAL HIGH (ref 11.6–15.4)
Retic Ct Pct: 7.7 % — ABNORMAL HIGH (ref 0.6–2.6)
Sodium: 139 mmol/L (ref 134–144)
Total Protein: 7.5 g/dL (ref 6.0–8.5)
Vit D, 25-Hydroxy: 23.7 ng/mL — ABNORMAL LOW (ref 30.0–100.0)
WBC: 4.5 10*3/uL (ref 3.4–10.8)
eGFR: 42 mL/min/{1.73_m2} — ABNORMAL LOW (ref >59)

## 2021-01-21 DIAGNOSIS — D571 Sickle-cell disease without crisis: Secondary | ICD-10-CM | POA: Diagnosis not present

## 2021-01-21 DIAGNOSIS — Z79899 Other long term (current) drug therapy: Secondary | ICD-10-CM | POA: Diagnosis not present

## 2021-01-21 DIAGNOSIS — N1831 Chronic kidney disease, stage 3a: Secondary | ICD-10-CM | POA: Diagnosis not present

## 2021-01-21 DIAGNOSIS — G8929 Other chronic pain: Secondary | ICD-10-CM | POA: Diagnosis not present

## 2021-01-21 DIAGNOSIS — J4540 Moderate persistent asthma, uncomplicated: Secondary | ICD-10-CM | POA: Diagnosis not present

## 2021-01-21 DIAGNOSIS — I129 Hypertensive chronic kidney disease with stage 1 through stage 4 chronic kidney disease, or unspecified chronic kidney disease: Secondary | ICD-10-CM | POA: Diagnosis not present

## 2021-01-21 DIAGNOSIS — D869 Sarcoidosis, unspecified: Secondary | ICD-10-CM | POA: Diagnosis not present

## 2021-01-21 DIAGNOSIS — E559 Vitamin D deficiency, unspecified: Secondary | ICD-10-CM | POA: Diagnosis not present

## 2021-01-21 DIAGNOSIS — T8089XA Other complications following infusion, transfusion and therapeutic injection, initial encounter: Secondary | ICD-10-CM | POA: Diagnosis not present

## 2021-02-12 DIAGNOSIS — Z8679 Personal history of other diseases of the circulatory system: Secondary | ICD-10-CM | POA: Diagnosis not present

## 2021-02-12 DIAGNOSIS — R519 Headache, unspecified: Secondary | ICD-10-CM | POA: Diagnosis not present

## 2021-02-25 ENCOUNTER — Ambulatory Visit: Payer: Medicare HMO | Admitting: Nurse Practitioner

## 2021-03-18 ENCOUNTER — Other Ambulatory Visit: Payer: Self-pay

## 2021-03-18 ENCOUNTER — Encounter: Payer: Self-pay | Admitting: Nurse Practitioner

## 2021-03-18 ENCOUNTER — Ambulatory Visit (INDEPENDENT_AMBULATORY_CARE_PROVIDER_SITE_OTHER): Payer: Medicare HMO | Admitting: Nurse Practitioner

## 2021-03-18 VITALS — BP 109/68 | HR 88 | Temp 97.0°F | Resp 16 | Ht 72.0 in | Wt 126.0 lb

## 2021-03-18 DIAGNOSIS — D571 Sickle-cell disease without crisis: Secondary | ICD-10-CM | POA: Diagnosis not present

## 2021-03-18 DIAGNOSIS — J42 Unspecified chronic bronchitis: Secondary | ICD-10-CM

## 2021-03-18 DIAGNOSIS — R8290 Unspecified abnormal findings in urine: Secondary | ICD-10-CM

## 2021-03-18 LAB — POCT URINALYSIS DIPSTICK
Bilirubin, UA: NEGATIVE
Glucose, UA: NEGATIVE
Ketones, UA: NEGATIVE
Leukocytes, UA: NEGATIVE
Nitrite, UA: NEGATIVE
Protein, UA: NEGATIVE
Spec Grav, UA: 1.015 (ref 1.010–1.025)
Urobilinogen, UA: 0.2 E.U./dL
pH, UA: 5.5 (ref 5.0–8.0)

## 2021-03-18 MED ORDER — BENZONATATE 100 MG PO CAPS: 100.0000 mg | ORAL_CAPSULE | Freq: Three times a day (TID) | ORAL | 1 refills | Status: AC | PRN

## 2021-03-18 NOTE — Patient Instructions (Signed)
Sickle Cell Anemia, Adult  Sickle cell anemia is a condition where your red blood cells are shaped like sickles. Red blood cells carry oxygen through the body. Sickle-shaped cells do not live as long as normal red blood cells. They also clump together and block blood from flowing through the blood vessels. This prevents the body from getting enough oxygen. Sickle cell anemia causes organ damage and pain. It alsoincreases the risk of infection. Follow these instructions at home: Medicines Take over-the-counter and prescription medicines only as told by your doctor. If you were prescribed an antibiotic medicine, take it as told by your doctor. Do not stop taking the antibiotic even if you start to feel better. If you develop a fever, do not take medicines to lower the fever right away. Tell your doctor about the fever. Managing pain, stiffness, and swelling Try these methods to help with pain: Use a heating pad. Take a warm bath. Distract yourself, such as by watching TV. Eating and drinking Drink enough fluid to keep your pee (urine) clear or pale yellow. Drink more in hot weather and during exercise. Limit or avoid alcohol. Eat a healthy diet. Eat plenty of fruits, vegetables, whole grains, and lean protein. Take vitamins and supplements as told by your doctor. Traveling When traveling, keep these with you: Your medical information. The names of your doctors. Your medicines. If you need to take an airplane, talk to your doctor first. Activity Rest often. Avoid exercises that make your heart beat much faster, such as jogging. General instructions Do not use products that have nicotine or tobacco, such as cigarettes and e-cigarettes. If you need help quitting, ask your doctor. Consider wearing a medical alert bracelet. Avoid being in high places (high altitudes), such as mountains. Avoid very hot or cold temperatures. Avoid places where the temperature changes a lot. Keep all follow-up  visits as told by your doctor. This is important. Contact a doctor if: A joint hurts. Your feet or hands hurt or swell. You feel tired (fatigued). Get help right away if: You have symptoms of infection. These include: Fever. Chills. Being very tired. Irritability. Poor eating. Throwing up (vomiting). You feel dizzy or faint. You have new stomach pain, especially on the left side. You have a an erection (priapism) that lasts more than 4 hours. You have numbness in your arms or legs. You have a hard time moving your arms or legs. You have trouble talking. You have pain that does not go away when you take medicine. You are short of breath. You are breathing fast. You have a long-term cough. You have pain in your chest. You have a bad headache. You have a stiff neck. Your stomach looks bloated even though you did not eat much. Your skin is pale. You suddenly cannot see well. Summary Sickle cell anemia is a condition where your red blood cells are shaped like sickles. Follow your doctor's advice on ways to manage pain, food to eat, activities to do, and steps to take for safe travel. Get medical help right away if you have any signs of infection, such as a fever. This information is not intended to replace advice given to you by your health care provider. Make sure you discuss any questions you have with your healthcare provider. Document Revised: 02/06/2020 Document Reviewed: 02/06/2020 Elsevier Patient Education  2022 [employer] Inc.   Cough, Adult A cough helps to clear your throat and lungs. A cough may be a sign of anillness or another medical condition.  An acute cough may only last 2-3 weeks, while a chronic cough may last 8 ormore weeks. Many things can cause a cough. They include: Germs (viruses or bacteria) that attack the airway. Breathing in things that bother (irritate) your lungs. Allergies. Asthma. Mucus that runs down the back of your throat (postnasal  drip). Smoking. Acid backing up from the stomach into the tube that moves food from the mouth to the stomach (gastroesophageal reflux). Some medicines. Lung problems. Other medical conditions, such as heart failure or a blood clot in the lung (pulmonary embolism). Follow these instructions at home: Medicines Take over-the-counter and prescription medicines only as told by your doctor. Talk with your doctor before you take medicines that stop a cough (cough suppressants). Lifestyle  Do not smoke, and try not to be around smoke. Do not use any products that contain nicotine or tobacco, such as cigarettes, e-cigarettes, and chewing tobacco. If you need help quitting, ask your doctor. Drink enough fluid to keep your pee (urine) pale yellow. Avoid caffeine. Do not drink alcohol if your doctor tells you not to drink.  General instructions  Watch for any changes in your cough. Tell your doctor about them. Always cover your mouth when you cough. Stay away from things that make you cough, such as perfume, candles, campfire smoke, or cleaning products. If the air is dry, use a cool mist vaporizer or humidifier in your home. If your cough is worse at night, try using extra pillows to raise your head up higher while you sleep. Rest as needed. Keep all follow-up visits as told by your doctor. This is important.  Contact a doctor if: You have new symptoms. You cough up pus. Your cough does not get better after 2-3 weeks, or your cough gets worse. Cough medicine does not help your cough and you are not sleeping well. You have pain that gets worse or pain that is not helped with medicine. You have a fever. You are losing weight and you do not know why. You have night sweats. Get help right away if: You cough up blood. You have trouble breathing. Your heartbeat is very fast. These symptoms may be an emergency. Do not wait to see if the symptoms will go away. Get medical help right away. Call  your local emergency services (911 in the [place] Do not drive yourself to the hospital. Summary A cough helps to clear your throat and lungs. Many things can cause a cough. Take over-the-counter and prescription medicines only as told by your doctor. Always cover your mouth when you cough. Contact a doctor if you have new symptoms or you have a cough that does not get better or gets worse. This information is not intended to replace advice given to you by your health care provider. Make sure you discuss any questions you have with your healthcare provider. Document Revised: 11/01/2019 Document Reviewed: 10/01/2018 Elsevier Patient Education  2022 [employer].

## 2021-03-18 NOTE — Progress Notes (Signed)
[facility] Patient [facility] [address] [place], [place]  [address] Phone:  [phone]   Fax:  [phone]   Established Patient Office Visit  Subjective:  Patient ID: Shawn Adams, male    DOB: [birth year]  Age: 45 y.o. MRN: [id number]  CC:  Chief Complaint  Patient presents with   Follow-up    3 month follow up     HPI [facility] presents for follow up. He  has a past medical history of Aneurysm (HCC), Heart murmur, Sarcoid, and Sickle cell anemia (HCC).   He denies any pain today . He has not had any pain in a while. He states that he states hydrated. He is followed by hematology at [facility]. He is currently on [place]. He reports continued cough. He also has low oxygen sats low 90's ; 92-93. This baseline.   Denies fever, headache, cough, wheezing, shortness of breath, chest pains, abdominal pain, back pain, hip pain, or leg pain. Denies any open wounds, skin irritation. Are you actively exercising? Is there intolerance Last eye exam was     Past Medical History:  Diagnosis Date   Aneurysm (HCC)    Pt states happened in 2011, Brain    Heart murmur    Sarcoid    Sickle cell anemia (HCC)     Past Surgical History:  Procedure Laterality Date   LYMPH NODE BIOPSY     NASAL SINUS SURGERY      Family History  Problem Relation Age of Onset   Diabetes Mother    Hypertension Father    Lupus Sister     Social History   Socioeconomic History   Marital status: Single    Spouse name: Not on file   Number of children: Not on file   Years of education: Not on file   Highest education level: Not on file  Occupational History   Not on file  Tobacco Use   Smoking status: Former    Pack years: 0.00    Types: Cigarettes   Smokeless tobacco: Never  Vaping Use   Vaping Use: Never used  Substance and Sexual Activity   Alcohol use: Not Currently    Alcohol/week: 1.0 standard drink    Types: 1 Cans of beer per week    Comment: occasionally   Drug  use: Not Currently    Types: Marijuana   Sexual activity: Yes    Birth control/protection: None  Other Topics Concern   Not on file  Social History Narrative   Not on file   Social Determinants of Health   Financial Resource Strain: Not on file  Food Insecurity: Not on file  Transportation Needs: Not on file  Physical Activity: Not on file  Stress: Not on file  Social Connections: Not on file  Intimate Partner Violence: Not on file    Outpatient Medications Prior to Visit  Medication Sig Dispense Refill   acetaminophen (TYLENOL) 500 MG tablet Take 500 mg by mouth every 6 (six) hours as needed for moderate pain or fever (pain).      albuterol (VENTOLIN HFA) 108 (90 Base) MCG/ACT inhaler Inhale into the lungs.     Ascorbic Acid (VITAMIN C) 1000 MG tablet Take 1,000 mg by mouth daily.     budesonide-formoterol (SYMBICORT) 160-4.5 MCG/ACT inhaler Inhale 2 puffs into the lungs 2 (two) times daily.     cyanocobalamin 50 MCG tablet Take by mouth.     cyclobenzaprine (FLEXERIL) 10 MG tablet Take 1  tablet (10 mg total) by mouth 3 (three) times daily as needed for muscle spasms. 15 tablet 0   docusate sodium (COLACE) 100 MG capsule Take 100 mg by mouth daily as needed for mild constipation.     ergocalciferol (VITAMIN D2) 1.25 MG (50000 UT) capsule Take 50,000 Units by mouth every 30 (thirty) days.      famotidine (PEPCID) 10 MG tablet Take 10 mg by mouth daily as needed for heartburn or indigestion.     folic acid (FOLVITE) 1 MG tablet Take 1 tablet (1 mg total) by mouth daily. 90 tablet 3   hydroxychloroquine (PLAQUENIL) 200 MG tablet Take 200 mg by mouth daily.     hydroxyurea (HYDREA) 500 MG capsule Take 2 capsules (1,000 mg total) by mouth daily. May take with food to minimize GI side effects. 180 capsule 3   INCRUSE ELLIPTA 62.5 MCG/INH AEPB Inhale 1 puff into the lungs daily.     levofloxacin (LEVAQUIN) 750 MG tablet Take 750 mg by mouth daily.      Melatonin 10 MG TABS Take 10 mg by  mouth at bedtime.     montelukast (SINGULAIR) 10 MG tablet Take 1 tablet (10 mg total) by mouth at bedtime. 30 tablet 3   ondansetron (ZOFRAN) 4 MG tablet Take 1 tablet (4 mg total) by mouth every 6 (six) hours. (Patient taking differently: Take 4 mg by mouth every 8 (eight) hours as needed for nausea or vomiting. ) 12 tablet 0   OXBRYTA 500 MG TABS tablet Take 1,500 mg by mouth.      Oxycodone HCl 20 MG TABS Take 20 mg by mouth every 4 (four) hours as needed (pain).      predniSONE (DELTASONE) 10 MG tablet Take 40 mg by mouth daily.  (Patient not taking: Reported on 06/29/2020)     predniSONE (DELTASONE) 10 MG tablet Take 4 tabs for 2 days, then 3 tabs for 2 days, then 2 tabs for 2 days, then 1 tab for 2 days, then stop 20 tablet 0   sodium bicarbonate 650 MG tablet Take 1,300 mg by mouth daily.      SYMBICORT 80-4.5 MCG/ACT inhaler Inhale into the lungs.     vitamin B-12 (CYANOCOBALAMIN) 500 MCG tablet Take 500 mcg by mouth daily.     No facility-administered medications prior to visit.    No Known Allergies  ROS Review of Systems    Objective:    Physical Exam Constitutional:      Comments: underweight  HENT:     Head: Normocephalic and atraumatic.     Nose: Nose normal.     Mouth/Throat:     Mouth: Mucous membranes are moist.  Cardiovascular:     Rate and Rhythm: Normal rate and regular rhythm.     Pulses: Normal pulses.     Heart sounds: Normal heart sounds.  Pulmonary:     Effort: Pulmonary effort is normal.     Breath sounds: Normal breath sounds.  Abdominal:     Palpations: Abdomen is soft.  Musculoskeletal:        General: Normal range of motion.     Cervical back: Normal range of motion.     Right lower leg: No edema.     Left lower leg: No edema.  Skin:    General: Skin is warm and dry.     Capillary Refill: Capillary refill takes less than 2 seconds.  Neurological:     General: No focal deficit present.  Mental Status: He is alert and oriented to person,  place, and time.  Psychiatric:        Mood and Affect: Mood normal.        Behavior: Behavior normal.        Thought Content: Thought content normal.        Judgment: Judgment normal.   BP 109/68 (BP Location: Left Arm, Patient Position: Sitting, Cuff Size: Normal)   Pulse 88   Temp (!) 97 F (36.1 C) (Temporal)   Resp 16   Ht 6' (1.829 m)   Wt 126 lb 0.2 oz (57.2 kg)   SpO2 93%   BMI 17.09 kg/m  Wt Readings from Last 3 Encounters:  03/18/21 126 lb 0.2 oz (57.2 kg)  12/16/20 136 lb (61.7 kg)  11/25/20 127 lb (57.6 kg)     Health Maintenance Due  Topic Date Due   Meningococcal B Vaccine (1 of 4 - Increased Risk Bexsero 2-dose series) Never done   Hepatitis C Screening  Never done   COVID-19 Vaccine (3 - Mixed Product risk series) 01/27/2020    There are no preventive care reminders to display for this patient.  No results found for: TSH Lab Results  Component Value Date   WBC 4.5 12/16/2020   HGB 8.5 (L) 12/16/2020   HCT 23.3 (L) 12/16/2020   MCV 118 (H) 12/16/2020   PLT 371 12/16/2020   Lab Results  Component Value Date   NA 139 12/16/2020   K 4.3 12/16/2020   CO2 16 (L) 12/16/2020   GLUCOSE 110 (H) 12/16/2020   BUN 31 (H) 12/16/2020   CREATININE 1.97 (H) 12/16/2020   BILITOT 0.9 12/16/2020   ALKPHOS 124 (H) 12/16/2020   AST 41 (H) 12/16/2020   ALT 45 (H) 12/16/2020   PROT 7.5 12/16/2020   ALBUMIN 4.1 12/16/2020   CALCIUM 9.5 12/16/2020   ANIONGAP 8 07/14/2020   EGFR 42 (L) 12/16/2020   No results found for: CHOL No results found for: HDL No results found for: [facility] Lab Results  Component Value Date   TRIG 182 (H) 07/11/2020   No results found for: CHOLHDL No results found for: HGBA1C    Assessment & Plan:   Problem List Items Addressed This Visit       Respiratory   Chronic bronchitis, unspecified chronic bronchitis type ([place]) Persistent  Follow up with pulmonology as scheduled or sooner if needed     Other   Hb-SS disease without  crisis ([place]) - Primary We discussed the need for good hydration, monitoring of hydration status, avoidance of heat, cold, stress, and infection triggers. We discussed the risks and benefits of Hydrea, including bone marrow suppression, the possibility of GI upset, skin ulcers, hair thinning, and teratogenicity. The patient was reminded of the need to seek medical attention of any symptoms of bleeding, anemia, or infection. Continue folic acid 1 mg daily to prevent aplastic bone marrow crises.    Relevant Orders   Urinalysis Dipstick (Completed)   Other Visit Diagnoses     Abnormal urinalysis       Relevant Orders   Urine Culture       Meds ordered this encounter  Medications   benzonatate (TESSALON) 100 MG capsule    Sig: Take 1 capsule (100 mg total) by mouth 3 (three) times daily as needed for up to 10 days for cough. Never suck or chew on a benzonatate capsule.    Dispense:  30 capsule    Refill:  1    Do not place medication on "Automatic Refill". Please provide patient with medication counseling and recommendations.    Order Specific Question:   Supervising Provider    Answer:   [name] [[id number]]    Follow-up: Return in about 3 months (around 06/18/2021) for Follow up SCD [id number].    Vevelyn Francois, NP

## 2021-03-20 LAB — URINE CULTURE

## 2021-04-01 ENCOUNTER — Other Ambulatory Visit: Payer: Self-pay | Admitting: Nurse Practitioner

## 2021-04-22 DIAGNOSIS — G8929 Other chronic pain: Secondary | ICD-10-CM | POA: Diagnosis not present

## 2021-04-22 DIAGNOSIS — T8089XA Other complications following infusion, transfusion and therapeutic injection, initial encounter: Secondary | ICD-10-CM | POA: Diagnosis not present

## 2021-04-22 DIAGNOSIS — E559 Vitamin D deficiency, unspecified: Secondary | ICD-10-CM | POA: Diagnosis not present

## 2021-04-22 DIAGNOSIS — I770 Arteriovenous fistula, acquired: Secondary | ICD-10-CM | POA: Diagnosis not present

## 2021-04-22 DIAGNOSIS — M7918 Myalgia, other site: Secondary | ICD-10-CM | POA: Diagnosis not present

## 2021-04-22 DIAGNOSIS — D869 Sarcoidosis, unspecified: Secondary | ICD-10-CM | POA: Diagnosis not present

## 2021-04-22 DIAGNOSIS — I272 Pulmonary hypertension, unspecified: Secondary | ICD-10-CM | POA: Diagnosis not present

## 2021-04-22 DIAGNOSIS — N1831 Chronic kidney disease, stage 3a: Secondary | ICD-10-CM | POA: Diagnosis not present

## 2021-04-22 DIAGNOSIS — D571 Sickle-cell disease without crisis: Secondary | ICD-10-CM | POA: Diagnosis not present

## 2021-04-23 ENCOUNTER — Institutional Professional Consult (permissible substitution): Payer: Medicare HMO | Admitting: Pulmonary Disease

## 2021-05-28 DIAGNOSIS — Z7951 Long term (current) use of inhaled steroids: Secondary | ICD-10-CM | POA: Diagnosis not present

## 2021-05-28 DIAGNOSIS — E8809 Other disorders of plasma-protein metabolism, not elsewhere classified: Secondary | ICD-10-CM | POA: Diagnosis not present

## 2021-05-28 DIAGNOSIS — J4540 Moderate persistent asthma, uncomplicated: Secondary | ICD-10-CM | POA: Diagnosis not present

## 2021-05-28 DIAGNOSIS — Z87891 Personal history of nicotine dependence: Secondary | ICD-10-CM | POA: Diagnosis not present

## 2021-05-28 DIAGNOSIS — N1832 Chronic kidney disease, stage 3b: Secondary | ICD-10-CM | POA: Diagnosis not present

## 2021-05-28 DIAGNOSIS — Z79899 Other long term (current) drug therapy: Secondary | ICD-10-CM | POA: Diagnosis not present

## 2021-05-28 DIAGNOSIS — Z8619 Personal history of other infectious and parasitic diseases: Secondary | ICD-10-CM | POA: Diagnosis not present

## 2021-05-28 DIAGNOSIS — N1831 Chronic kidney disease, stage 3a: Secondary | ICD-10-CM | POA: Diagnosis not present

## 2021-05-28 DIAGNOSIS — D631 Anemia in chronic kidney disease: Secondary | ICD-10-CM | POA: Diagnosis not present

## 2021-05-28 DIAGNOSIS — D571 Sickle-cell disease without crisis: Secondary | ICD-10-CM | POA: Diagnosis not present

## 2021-05-28 DIAGNOSIS — Z8616 Personal history of COVID-19: Secondary | ICD-10-CM | POA: Diagnosis not present

## 2021-06-14 ENCOUNTER — Telehealth: Payer: Self-pay

## 2021-06-14 NOTE — Telephone Encounter (Signed)
Patient called, left VM to return the call to the office to schedule medicare wellness visit with a nurse. 

## 2021-06-21 ENCOUNTER — Ambulatory Visit: Payer: Medicare HMO | Admitting: Nurse Practitioner

## 2021-07-05 DIAGNOSIS — D5700 Hb-SS disease with crisis, unspecified: Secondary | ICD-10-CM | POA: Diagnosis not present

## 2021-07-09 ENCOUNTER — Other Ambulatory Visit: Payer: Self-pay

## 2021-07-09 ENCOUNTER — Encounter: Payer: Self-pay | Admitting: Nurse Practitioner

## 2021-07-09 ENCOUNTER — Ambulatory Visit (INDEPENDENT_AMBULATORY_CARE_PROVIDER_SITE_OTHER): Payer: Medicare HMO | Admitting: Nurse Practitioner

## 2021-07-09 VITALS — BP 116/71 | HR 78 | Temp 98.4°F | Ht 72.0 in | Wt 134.6 lb

## 2021-07-09 DIAGNOSIS — Z1211 Encounter for screening for malignant neoplasm of colon: Secondary | ICD-10-CM | POA: Diagnosis not present

## 2021-07-09 DIAGNOSIS — D571 Sickle-cell disease without crisis: Secondary | ICD-10-CM

## 2021-07-09 DIAGNOSIS — G4700 Insomnia, unspecified: Secondary | ICD-10-CM

## 2021-07-09 DIAGNOSIS — J42 Unspecified chronic bronchitis: Secondary | ICD-10-CM | POA: Diagnosis not present

## 2021-07-09 DIAGNOSIS — Z125 Encounter for screening for malignant neoplasm of prostate: Secondary | ICD-10-CM

## 2021-07-09 NOTE — Progress Notes (Signed)
[place], [place]  [address] Phone:  [phone]   Fax:  [phone]   Established Patient Office Visit  Subjective:  Patient ID: Shawn Adams, male    DOB: [birth year]  Age: 45 y.o. MRN: [id number] CC:  Chief Complaint  Patient presents with   Follow-up    Follow up;Sickle cell anemia Pt states that he would like to know if he is ok to work around lead material due to him having sickle cell anemia.    HPI Shawn Adams presents for follow up. He  has a past medical history of Aneurysm ([place]), Heart murmur, Sarcoid, and Sickle cell anemia ([place]).   He is in today for a follow up. He is followed by hematology for his SCA. He is currently on Hydrea, folic acid and oxbryta.   He has recently started a new job. He he is looking forward to becoming a grandfather to a baby girl. Denies fever, headache, cough, wheezing, shortness of breath, chest pains, abdominal pain, back pain, hip pain, or leg pain. Denies any open wounds, skin irritation. He is working second shift at and is doing well.    Past Medical History:  Diagnosis Date   Aneurysm ([place])    Pt states happened in 2011, Brain    Heart murmur    Sarcoid    Sickle cell anemia (HCC)     Past Surgical History:  Procedure Laterality Date   LYMPH NODE BIOPSY     NASAL SINUS SURGERY      Family History  Problem Relation Age of Onset   Diabetes Mother    Hypertension Father    Lupus Sister     Social History   Socioeconomic History   Marital status: Single    Spouse name: Not on file   Number of children: Not on file   Years of education: Not on file   Highest education level: Not on file  Occupational History   Not on file  Tobacco Use   Smoking status: Former    Types: Cigarettes   Smokeless tobacco: Never  Vaping Use   Vaping Use: Never used  Substance and Sexual Activity   Alcohol use: Not Currently    Alcohol/week: 1.0 standard drink    Types: 1 Cans of beer  per week    Comment: occasionally   Drug use: Not Currently    Types: Marijuana   Sexual activity: Yes    Birth control/protection: None  Other Topics Concern   Not on file  Social History Narrative   Not on file   Social Determinants of Health   Financial Resource Strain: Not on file  Food Insecurity: Not on file  Transportation Needs: Not on file  Physical Activity: Not on file  Stress: Not on file  Social Connections: Not on file  Intimate Partner Violence: Not on file    Outpatient Medications Prior to Visit  Medication Sig Dispense Refill   acetaminophen (TYLENOL) 500 MG tablet Take 500 mg by mouth every 6 (six) hours as needed for moderate pain or fever (pain).      Ascorbic Acid (VITAMIN C) 1000 MG tablet Take 1,000 mg by mouth daily.     cyanocobalamin 50 MCG tablet Take by mouth.     cyclobenzaprine (FLEXERIL) 10 MG tablet Take 1 tablet (10 mg total) by mouth 3 (three) times daily as needed for muscle spasms. 15 tablet 0   docusate sodium (COLACE) 100 MG capsule Take  100 mg by mouth daily as needed for mild constipation.     ergocalciferol (VITAMIN D2) 1.25 MG (50000 UT) capsule Take 50,000 Units by mouth every 30 (thirty) days.      famotidine (PEPCID) 10 MG tablet Take 10 mg by mouth daily as needed for heartburn or indigestion.     folic acid (FOLVITE) 1 MG tablet Take 1 tablet (1 mg total) by mouth daily. 90 tablet 3   hydroxychloroquine (PLAQUENIL) 200 MG tablet Take 200 mg by mouth daily.     hydroxyurea (HYDREA) 500 MG capsule Take 2 capsules (1,000 mg total) by mouth daily. May take with food to minimize GI side effects. 180 capsule 3   INCRUSE ELLIPTA 62.5 MCG/INH AEPB Inhale 1 puff into the lungs daily.     levofloxacin (LEVAQUIN) 750 MG tablet Take 750 mg by mouth daily.      Melatonin 10 MG TABS Take 10 mg by mouth at bedtime.     montelukast (SINGULAIR) 10 MG tablet TAKE 1 TABLET(10 MG) BY MOUTH AT BEDTIME 30 tablet 3   ondansetron (ZOFRAN) 4 MG tablet Take  1 tablet (4 mg total) by mouth every 6 (six) hours. (Patient taking differently: Take 4 mg by mouth every 8 (eight) hours as needed for nausea or vomiting.) 12 tablet 0   OXBRYTA 500 MG TABS tablet Take 1,500 mg by mouth.      Oxycodone HCl 20 MG TABS Take 20 mg by mouth every 4 (four) hours as needed (pain).      predniSONE (DELTASONE) 10 MG tablet Take 40 mg by mouth daily.     predniSONE (DELTASONE) 10 MG tablet Take 4 tabs for 2 days, then 3 tabs for 2 days, then 2 tabs for 2 days, then 1 tab for 2 days, then stop 20 tablet 0   sodium bicarbonate 650 MG tablet Take 1,300 mg by mouth daily.      SYMBICORT 80-4.5 MCG/ACT inhaler Inhale into the lungs.     vitamin B-12 (CYANOCOBALAMIN) 500 MCG tablet Take 500 mcg by mouth daily.     albuterol (VENTOLIN HFA) 108 (90 Base) MCG/ACT inhaler Inhale into the lungs.     budesonide-formoterol (SYMBICORT) 160-4.5 MCG/ACT inhaler Inhale 2 puffs into the lungs 2 (two) times daily.     No facility-administered medications prior to visit.    No Known Allergies  ROS Review of Systems    Objective:    Physical Exam Constitutional:      Comments: underweight  HENT:     Head: Normocephalic and atraumatic.     Nose: Nose normal.     Mouth/Throat:     Mouth: Mucous membranes are moist.  Cardiovascular:     Rate and Rhythm: Normal rate and regular rhythm.     Pulses: Normal pulses.     Heart sounds: Normal heart sounds.  Pulmonary:     Effort: Pulmonary effort is normal.     Breath sounds: Normal breath sounds.  Abdominal:     Palpations: Abdomen is soft.  Musculoskeletal:        General: Normal range of motion.     Cervical back: Normal range of motion.     Right lower leg: No edema.     Left lower leg: No edema.  Skin:    General: Skin is warm and dry.     Capillary Refill: Capillary refill takes less than 2 seconds.  Neurological:     General: No focal deficit present.     Mental  Status: He is alert and oriented to person, place, and  time.  Psychiatric:        Mood and Affect: Mood normal.        Behavior: Behavior normal.        Thought Content: Thought content normal.        Judgment: Judgment normal.    BP 116/71   Pulse 78   Temp 98.4 F (36.9 C)   Ht 6' (1.829 m)   Wt 134 lb 9.6 oz (61.1 kg)   SpO2 100%   BMI 18.26 kg/m  Wt Readings from Last 3 Encounters:  07/09/21 134 lb 9.6 oz (61.1 kg)  03/18/21 126 lb 0.2 oz (57.2 kg)  12/16/20 136 lb (61.7 kg)     There are no preventive care reminders to display for this patient.   There are no preventive care reminders to display for this patient.  No results found for: TSH Lab Results  Component Value Date   WBC 4.5 12/16/2020   HGB 8.5 (L) 12/16/2020   HCT 23.3 (L) 12/16/2020   MCV 118 (H) 12/16/2020   PLT 371 12/16/2020   Lab Results  Component Value Date   NA 139 12/16/2020   K 4.3 12/16/2020   CO2 16 (L) 12/16/2020   GLUCOSE 110 (H) 12/16/2020   BUN 31 (H) 12/16/2020   CREATININE 1.97 (H) 12/16/2020   BILITOT 0.9 12/16/2020   ALKPHOS 124 (H) 12/16/2020   AST 41 (H) 12/16/2020   ALT 45 (H) 12/16/2020   PROT 7.5 12/16/2020   ALBUMIN 4.1 12/16/2020   CALCIUM 9.5 12/16/2020   ANIONGAP 8 07/14/2020   EGFR 42 (L) 12/16/2020   No results found for: CHOL No results found for: HDL No results found for: [facility] Lab Results  Component Value Date   TRIG 182 (H) 07/11/2020   No results found for: CHOLHDL No results found for: HGBA1C    Assessment & Plan:   Problem List Items Addressed This Visit       Respiratory   Chronic bronchitis, unspecified chronic bronchitis type ([place]) Stable Continue with current regimen.  No changes warranted. Good patient compliance.      Other   Hb-SS disease without crisis ([place]) - Primary Stable labs completed with hematology 07/05/2021  H&H 6.2 &17.3  We discussed the need for good hydration, monitoring of hydration status, avoidance of heat, cold, stress, and infection triggers. We discussed the  risks and benefits of Hydrea, including bone marrow suppression, the possibility of GI upset, skin ulcers, hair thinning, and teratogenicity. The patient was reminded of the need to seek medical attention of any symptoms of bleeding, anemia, or infection. Continue folic acid 1 mg daily to prevent aplastic bone marrow crises.    Other Visit Diagnoses     Insomnia, unspecified type     Stable    Screening for colon cancer       Relevant Orders   Ambulatory referral to Gastroenterology   Screening for malignant neoplasm of prostate       Relevant Orders   PSA       No orders of the defined types were placed in this encounter.   Follow-up: Return in about 6 months (around 01/07/2022).    Vevelyn Francois, NP

## 2021-07-09 NOTE — Patient Instructions (Signed)
Sickle Cell Anemia, Adult ?Sickle cell anemia is a condition where your red blood cells are shaped like sickles. Red blood cells carry oxygen through the body. Sickle-shaped cells do not live as long as normal red blood cells. They also clump together and block blood from flowing through the blood vessels. This prevents the body from getting enough oxygen. Sickle cell anemia causes organ damage and pain. It also increases the risk of infection. ?Follow these instructions at home: ?Medicines ?Take over-the-counter and prescription medicines only as told by your doctor. ?If you were prescribed an antibiotic medicine, take it as told by your doctor. Do not stop taking the antibiotic even if you start to feel better. ?If you develop a fever, do not take medicines to lower the fever right away. Tell your doctor about the fever. ?Managing pain, stiffness, and swelling ?Try these methods to help with pain: ?Use a heating pad. ?Take a warm bath. ?Distract yourself, such as by watching TV. ?Eating and drinking ?Drink enough fluid to keep your pee (urine) clear or pale yellow. Drink more in hot weather and during exercise. ?Limit or avoid alcohol. ?Eat a healthy diet. Eat plenty of fruits, vegetables, whole grains, and lean protein. ?Take vitamins and supplements as told by your doctor. ?Traveling ?When traveling, keep these with you: ?Your medical information. ?The names of your doctors. ?Your medicines. ?If you need to take an airplane, talk to your doctor first. ?Activity ?Rest often. ?Avoid exercises that make your heart beat much faster, such as jogging. ?General instructions ?Do not use products that have nicotine or tobacco, such as cigarettes and e-cigarettes. If you need help quitting, ask your doctor. ?Consider wearing a medical alert bracelet. ?Avoid being in high places (high altitudes), such as mountains. ?Avoid very hot or cold temperatures. ?Avoid places where the temperature changes a lot. ?Keep all follow-up  visits as told by your doctor. This is important. ?Contact a doctor if: ?A joint hurts. ?Your feet or hands hurt or swell. ?You feel tired (fatigued). ?Get help right away if: ?You have symptoms of infection. These include: ?Fever. ?Chills. ?Being very tired. ?Irritability. ?Poor eating. ?Throwing up (vomiting). ?You feel dizzy or faint. ?You have new stomach pain, especially on the left side. ?You have a an erection (priapism) that lasts more than 4 hours. ?You have numbness in your arms or legs. ?You have a hard time moving your arms or legs. ?You have trouble talking. ?You have pain that does not go away when you take medicine. ?You are short of breath. ?You are breathing fast. ?You have a long-term cough. ?You have pain in your chest. ?You have a bad headache. ?You have a stiff neck. ?Your stomach looks bloated even though you did not eat much. ?Your skin is pale. ?You suddenly cannot see well. ?Summary ?Sickle cell anemia is a condition where your red blood cells are shaped like sickles. ?Follow your doctor's advice on ways to manage pain, food to eat, activities to do, and steps to take for safe travel. ?Get medical help right away if you have any signs of infection, such as a fever. ?This information is not intended to replace advice given to you by your health care provider. Make sure you discuss any questions you have with your health care provider. ?Document Revised: 02/06/2020 Document Reviewed: 02/06/2020 ?Elsevier Patient Education ? 2022 [employer] ? ?

## 2021-07-14 ENCOUNTER — Encounter: Payer: Self-pay | Admitting: Nurse Practitioner

## 2021-07-22 DIAGNOSIS — M7918 Myalgia, other site: Secondary | ICD-10-CM | POA: Diagnosis not present

## 2021-07-22 DIAGNOSIS — N189 Chronic kidney disease, unspecified: Secondary | ICD-10-CM | POA: Diagnosis not present

## 2021-07-22 DIAGNOSIS — Z8709 Personal history of other diseases of the respiratory system: Secondary | ICD-10-CM | POA: Diagnosis not present

## 2021-07-22 DIAGNOSIS — D571 Sickle-cell disease without crisis: Secondary | ICD-10-CM | POA: Diagnosis not present

## 2021-07-22 DIAGNOSIS — N1831 Chronic kidney disease, stage 3a: Secondary | ICD-10-CM | POA: Diagnosis not present

## 2021-07-22 DIAGNOSIS — T8089XA Other complications following infusion, transfusion and therapeutic injection, initial encounter: Secondary | ICD-10-CM | POA: Diagnosis not present

## 2021-07-22 DIAGNOSIS — G8929 Other chronic pain: Secondary | ICD-10-CM | POA: Diagnosis not present

## 2021-07-22 DIAGNOSIS — E559 Vitamin D deficiency, unspecified: Secondary | ICD-10-CM | POA: Diagnosis not present

## 2021-07-22 DIAGNOSIS — Z23 Encounter for immunization: Secondary | ICD-10-CM | POA: Diagnosis not present

## 2021-10-16 ENCOUNTER — Inpatient Hospital Stay (HOSPITAL_COMMUNITY)
Admission: EM | Admit: 2021-10-16 | Discharge: 2021-10-22 | DRG: 812 | Disposition: A | Discharge status: Home / Self Care | Payer: Medicare HMO | Attending: Internal Medicine | Admitting: Internal Medicine

## 2021-10-16 ENCOUNTER — Encounter (HOSPITAL_COMMUNITY): Payer: Self-pay

## 2021-10-16 ENCOUNTER — Other Ambulatory Visit: Payer: Self-pay

## 2021-10-16 DIAGNOSIS — G894 Chronic pain syndrome: Secondary | ICD-10-CM | POA: Diagnosis present

## 2021-10-16 DIAGNOSIS — D869 Sarcoidosis, unspecified: Secondary | ICD-10-CM | POA: Diagnosis not present

## 2021-10-16 DIAGNOSIS — D631 Anemia in chronic kidney disease: Secondary | ICD-10-CM | POA: Diagnosis not present

## 2021-10-16 DIAGNOSIS — Z832 Family history of diseases of the blood and blood-forming organs and certain disorders involving the immune mechanism: Secondary | ICD-10-CM | POA: Diagnosis not present

## 2021-10-16 DIAGNOSIS — D5700 Hb-SS disease with crisis, unspecified: Principal | ICD-10-CM | POA: Diagnosis present

## 2021-10-16 DIAGNOSIS — N1832 Chronic kidney disease, stage 3b: Secondary | ICD-10-CM | POA: Diagnosis not present

## 2021-10-16 DIAGNOSIS — Z87891 Personal history of nicotine dependence: Secondary | ICD-10-CM | POA: Diagnosis not present

## 2021-10-16 DIAGNOSIS — J45909 Unspecified asthma, uncomplicated: Secondary | ICD-10-CM | POA: Diagnosis present

## 2021-10-16 DIAGNOSIS — Z20822 Contact with and (suspected) exposure to covid-19: Secondary | ICD-10-CM | POA: Diagnosis not present

## 2021-10-16 DIAGNOSIS — R69 Illness, unspecified: Secondary | ICD-10-CM | POA: Diagnosis not present

## 2021-10-16 DIAGNOSIS — R030 Elevated blood-pressure reading, without diagnosis of hypertension: Secondary | ICD-10-CM | POA: Diagnosis present

## 2021-10-16 DIAGNOSIS — D638 Anemia in other chronic diseases classified elsewhere: Secondary | ICD-10-CM | POA: Diagnosis present

## 2021-10-16 DIAGNOSIS — D649 Anemia, unspecified: Secondary | ICD-10-CM

## 2021-10-16 DIAGNOSIS — N182 Chronic kidney disease, stage 2 (mild): Secondary | ICD-10-CM | POA: Diagnosis present

## 2021-10-16 DIAGNOSIS — J4540 Moderate persistent asthma, uncomplicated: Secondary | ICD-10-CM | POA: Diagnosis present

## 2021-10-16 DIAGNOSIS — M545 Low back pain, unspecified: Secondary | ICD-10-CM | POA: Diagnosis not present

## 2021-10-16 DIAGNOSIS — N179 Acute kidney failure, unspecified: Secondary | ICD-10-CM | POA: Diagnosis present

## 2021-10-16 DIAGNOSIS — Z79899 Other long term (current) drug therapy: Secondary | ICD-10-CM

## 2021-10-16 DIAGNOSIS — R011 Cardiac murmur, unspecified: Secondary | ICD-10-CM | POA: Diagnosis present

## 2021-10-16 DIAGNOSIS — Z8679 Personal history of other diseases of the circulatory system: Secondary | ICD-10-CM

## 2021-10-16 DIAGNOSIS — F1120 Opioid dependence, uncomplicated: Secondary | ICD-10-CM | POA: Diagnosis present

## 2021-10-16 DIAGNOSIS — N189 Chronic kidney disease, unspecified: Secondary | ICD-10-CM

## 2021-10-16 LAB — COMPREHENSIVE METABOLIC PANEL
ALT: 33 U/L (ref 0–44)
AST: 52 U/L — ABNORMAL HIGH (ref 15–41)
Albumin: 4.1 g/dL (ref 3.5–5.0)
Alkaline Phosphatase: 120 U/L (ref 38–126)
Anion gap: 8 (ref 5–15)
BUN: 34 mg/dL — ABNORMAL HIGH (ref 6–20)
CO2: 16 mmol/L — ABNORMAL LOW (ref 22–32)
Calcium: 8.7 mg/dL — ABNORMAL LOW (ref 8.9–10.3)
Chloride: 111 mmol/L (ref 98–111)
Creatinine, Ser: 2.73 mg/dL — ABNORMAL HIGH (ref 0.61–1.24)
GFR, Estimated: 28 mL/min — ABNORMAL LOW (ref >60)
Glucose, Bld: 117 mg/dL — ABNORMAL HIGH (ref 70–99)
Potassium: 4 mmol/L (ref 3.5–5.1)
Sodium: 135 mmol/L (ref 135–145)
Total Bilirubin: 1.7 mg/dL — ABNORMAL HIGH (ref 0.3–1.2)
Total Protein: 7.7 g/dL (ref 6.5–8.1)

## 2021-10-16 LAB — CBC
HCT: 14.3 % — ABNORMAL LOW (ref 39.0–52.0)
Hemoglobin: 5.3 g/dL — CL (ref 13.0–17.0)
MCH: 36.6 pg — ABNORMAL HIGH (ref 26.0–34.0)
MCHC: 37.1 g/dL — ABNORMAL HIGH (ref 30.0–36.0)
MCV: 98.6 fL (ref 80.0–100.0)
Platelets: 255 10*3/uL (ref 150–400)
RBC: 1.45 MIL/uL — ABNORMAL LOW (ref 4.22–5.81)
RDW: 23.2 % — ABNORMAL HIGH (ref 11.5–15.5)
WBC: 8.5 10*3/uL (ref 4.0–10.5)
nRBC: 4.6 % — ABNORMAL HIGH (ref 0.0–0.2)

## 2021-10-16 LAB — HIV ANTIBODY (ROUTINE TESTING W REFLEX): HIV Screen 4th Generation wRfx: NONREACTIVE

## 2021-10-16 LAB — RESP PANEL BY RT-PCR (FLU A&B, COVID) ARPGX2
Influenza A by PCR: NEGATIVE
Influenza B by PCR: NEGATIVE
SARS Coronavirus 2 by RT PCR: NEGATIVE

## 2021-10-16 LAB — CBC WITH DIFFERENTIAL/PLATELET
Abs Immature Granulocytes: 0.07 10*3/uL (ref 0.00–0.07)
Basophils Absolute: 0.1 10*3/uL (ref 0.0–0.1)
Basophils Relative: 1 %
Eosinophils Absolute: 0.2 10*3/uL (ref 0.0–0.5)
Eosinophils Relative: 2 %
HCT: 16.7 % — ABNORMAL LOW (ref 39.0–52.0)
Hemoglobin: 6.2 g/dL — CL (ref 13.0–17.0)
Immature Granulocytes: 1 %
Lymphocytes Relative: 28 %
Lymphs Abs: 1.9 10*3/uL (ref 0.7–4.0)
MCH: 36.5 pg — ABNORMAL HIGH (ref 26.0–34.0)
MCHC: 37.1 g/dL — ABNORMAL HIGH (ref 30.0–36.0)
MCV: 98.2 fL (ref 80.0–100.0)
Monocytes Absolute: 0.5 10*3/uL (ref 0.1–1.0)
Monocytes Relative: 8 %
Neutro Abs: 4 10*3/uL (ref 1.7–7.7)
Neutrophils Relative %: 60 %
Platelets: 312 10*3/uL (ref 150–400)
RBC: 1.7 MIL/uL — ABNORMAL LOW (ref 4.22–5.81)
RDW: 23.4 % — ABNORMAL HIGH (ref 11.5–15.5)
WBC: 6.7 10*3/uL (ref 4.0–10.5)
nRBC: 5.2 % — ABNORMAL HIGH (ref 0.0–0.2)

## 2021-10-16 LAB — CREATININE, SERUM
Creatinine, Ser: 2.44 mg/dL — ABNORMAL HIGH (ref 0.61–1.24)
GFR, Estimated: 32 mL/min — ABNORMAL LOW (ref >60)

## 2021-10-16 LAB — RETICULOCYTES
Immature Retic Fract: 36.1 % — ABNORMAL HIGH (ref 2.3–15.9)
RBC.: 1.7 MIL/uL — ABNORMAL LOW (ref 4.22–5.81)
Retic Count, Absolute: 143.8 10*3/uL (ref 19.0–186.0)
Retic Ct Pct: 8.5 % — ABNORMAL HIGH (ref 0.4–3.1)

## 2021-10-16 LAB — PREPARE RBC (CROSSMATCH)

## 2021-10-16 MED ORDER — FOLIC ACID 1 MG PO TABS
1.0000 mg | ORAL_TABLET | Freq: Every day | ORAL | Status: DC
  Administered 2021-10-16 – 2021-10-22 (×7): 1 mg via ORAL
  Filled 2021-10-16 (×7): qty 1

## 2021-10-16 MED ORDER — MOMETASONE FURO-FORMOTEROL FUM 200-5 MCG/ACT IN AERO: 2.0000 | INHALATION_SPRAY | Freq: Two times a day (BID) | RESPIRATORY_TRACT | Status: DC

## 2021-10-16 MED ORDER — OXYCODONE HCL 5 MG PO TABS: 15.0000 mg | ORAL_TABLET | Freq: Once | ORAL | Status: DC

## 2021-10-16 MED ORDER — NALOXONE HCL 0.4 MG/ML IJ SOLN: 0.4000 mg | INTRAMUSCULAR | Status: DC | PRN

## 2021-10-16 MED ORDER — HYDROMORPHONE HCL 2 MG/ML IJ SOLN
2.0000 mg | INTRAMUSCULAR | Status: AC
  Administered 2021-10-16: 2 mg via INTRAVENOUS
  Filled 2021-10-16: qty 1

## 2021-10-16 MED ORDER — HYDROMORPHONE HCL 1 MG/ML IJ SOLN
1.0000 mg | INTRAMUSCULAR | Status: DC | PRN
  Administered 2021-10-16 (×2): 1 mg via INTRAVENOUS
  Filled 2021-10-16 (×2): qty 1

## 2021-10-16 MED ORDER — LABETALOL HCL 5 MG/ML IV SOLN
5.0000 mg | INTRAVENOUS | Status: DC | PRN
  Filled 2021-10-16: qty 4

## 2021-10-16 MED ORDER — DIPHENHYDRAMINE HCL 25 MG PO CAPS: 25.0000 mg | ORAL_CAPSULE | ORAL | Status: DC | PRN

## 2021-10-16 MED ORDER — KETOROLAC TROMETHAMINE 15 MG/ML IJ SOLN
15.0000 mg | INTRAMUSCULAR | Status: AC
  Administered 2021-10-16: 15 mg via INTRAVENOUS
  Filled 2021-10-16: qty 1

## 2021-10-16 MED ORDER — ENOXAPARIN SODIUM 30 MG/0.3ML IJ SOSY
30.0000 mg | PREFILLED_SYRINGE | INTRAMUSCULAR | Status: DC
  Administered 2021-10-16: 30 mg via SUBCUTANEOUS
  Filled 2021-10-16 (×2): qty 0.3

## 2021-10-16 MED ORDER — SENNOSIDES-DOCUSATE SODIUM 8.6-50 MG PO TABS
1.0000 | ORAL_TABLET | Freq: Two times a day (BID) | ORAL | Status: DC
  Administered 2021-10-16 – 2021-10-22 (×13): 1 via ORAL
  Filled 2021-10-16 (×13): qty 1

## 2021-10-16 MED ORDER — MOMETASONE FURO-FORMOTEROL FUM 100-5 MCG/ACT IN AERO
2.0000 | INHALATION_SPRAY | Freq: Two times a day (BID) | RESPIRATORY_TRACT | Status: DC
  Administered 2021-10-16 – 2021-10-22 (×12): 2 via RESPIRATORY_TRACT
  Filled 2021-10-16: qty 8.8

## 2021-10-16 MED ORDER — HYDROMORPHONE 1 MG/ML IV SOLN
INTRAVENOUS | Status: DC
  Administered 2021-10-16: 0.3 mg via INTRAVENOUS
  Administered 2021-10-18: 0.9 mg via INTRAVENOUS
  Administered 2021-10-18: 0.6 mg via INTRAVENOUS
  Administered 2021-10-18: 0.3 mg via INTRAVENOUS
  Administered 2021-10-18: 1.2 mg via INTRAVENOUS
  Administered 2021-10-18: 0 mg via INTRAVENOUS
  Administered 2021-10-19: 0.6 mg via INTRAVENOUS
  Administered 2021-10-19: 0.3 mg via INTRAVENOUS
  Administered 2021-10-19: 0 mg via INTRAVENOUS
  Administered 2021-10-19: 0.6 mg via INTRAVENOUS
  Administered 2021-10-20: 0 mg via INTRAVENOUS
  Administered 2021-10-20: 0.3 mg via INTRAVENOUS
  Administered 2021-10-20: 0.6 mg via INTRAVENOUS
  Administered 2021-10-20: 1.8 mg via INTRAVENOUS
  Administered 2021-10-21: 0.6 mg via INTRAVENOUS
  Administered 2021-10-21: 0.3 mg via INTRAVENOUS
  Filled 2021-10-16: qty 30

## 2021-10-16 MED ORDER — SODIUM BICARBONATE 650 MG PO TABS
650.0000 mg | ORAL_TABLET | Freq: Every day | ORAL | Status: DC
  Administered 2021-10-17 – 2021-10-22 (×6): 650 mg via ORAL
  Filled 2021-10-16 (×7): qty 1

## 2021-10-16 MED ORDER — VOXELOTOR 500 MG PO TABS: 1500.0000 mg | ORAL_TABLET | Freq: Every day | ORAL | Status: DC

## 2021-10-16 MED ORDER — SODIUM CHLORIDE 0.9% FLUSH: 9.0000 mL | INTRAVENOUS | Status: DC | PRN

## 2021-10-16 MED ORDER — VITAMIN B-12 100 MCG PO TABS
100.0000 ug | ORAL_TABLET | Freq: Every day | ORAL | Status: DC
  Administered 2021-10-16 – 2021-10-22 (×7): 100 ug via ORAL
  Filled 2021-10-16 (×7): qty 1

## 2021-10-16 MED ORDER — SODIUM CHLORIDE 0.45 % IV SOLN: INTRAVENOUS | Status: DC

## 2021-10-16 MED ORDER — POLYETHYLENE GLYCOL 3350 17 G PO PACK
17.0000 g | PACK | Freq: Every day | ORAL | Status: DC | PRN
  Administered 2021-10-16: 17 g via ORAL
  Filled 2021-10-16: qty 1

## 2021-10-16 MED ORDER — FAMOTIDINE 20 MG PO TABS: 10.0000 mg | ORAL_TABLET | Freq: Every day | ORAL | Status: DC | PRN

## 2021-10-16 MED ORDER — SODIUM CHLORIDE 0.9 % IV SOLN
10.0000 mL/h | Freq: Once | INTRAVENOUS | Status: AC
  Administered 2021-10-16: 10 mL/h via INTRAVENOUS

## 2021-10-16 MED ORDER — ONDANSETRON HCL 4 MG/2ML IJ SOLN
4.0000 mg | INTRAMUSCULAR | Status: DC | PRN
  Administered 2021-10-16: 4 mg via INTRAVENOUS
  Filled 2021-10-16: qty 2

## 2021-10-16 MED ORDER — ONDANSETRON HCL 4 MG/2ML IJ SOLN: 4.0000 mg | Freq: Four times a day (QID) | INTRAMUSCULAR | Status: DC | PRN

## 2021-10-16 MED ORDER — MELATONIN 5 MG PO TABS
10.0000 mg | ORAL_TABLET | Freq: Every day | ORAL | Status: DC
  Administered 2021-10-16 – 2021-10-21 (×6): 10 mg via ORAL
  Filled 2021-10-16 (×7): qty 2

## 2021-10-16 NOTE — ED Notes (Signed)
Pt found standing at end of bed with O2 off; pt encouraged to get back in bed and put O2 back on; blood bank called to states blood is ready

## 2021-10-16 NOTE — H&P (Signed)
History and Physical    Shawn Adams [id number] DOB: [birth year] DOA: 10/16/2021  PCP: Shawn Francois, NP  Patient coming from: Home.  Chief Complaint: Pain.  HPI: Shawn Adams is a 46 y.o. male with history of sickle cell anemia, sarcoidosis, aneurysm of the brain, anemia presents to the ER because of worsening pain which has been ongoing for the last few weeks.  Pain is mostly in the lower back and extremities.  Denies fever chills chest pain or shortness of breath.  ED Course: In the ER patient is afebrile and not hypoxic.  Appears nonfocal.  Hemoglobin is around 6.2 with drop of from usual of around 8.  Creatinine 2.7 increased from 1.6 recently.  COVID test is pending.  Patient admitted for further management of sickle cell pain crisis.  Review of Systems: As per HPI, rest all negative.   Past Medical History:  Diagnosis Date   Aneurysm ([place])    Pt states happened in 2011, Brain    Heart murmur    Sarcoid    Sickle cell anemia ([place])     Past Surgical History:  Procedure Laterality Date   LYMPH NODE BIOPSY     NASAL SINUS SURGERY       reports that he has quit smoking. His smoking use included cigarettes. He has never used smokeless tobacco. He reports that he does not currently use alcohol after a past usage of about 1.0 standard drink per week. He reports that he does not currently use drugs after having used the following drugs: Marijuana.  No Known Allergies  Family History  Problem Relation Age of Onset   Diabetes Mother    Hypertension Father    Lupus Sister     Prior to Admission medications   Medication Sig Start Date End Date Taking? Authorizing Provider  acetaminophen (TYLENOL) 500 MG tablet Take 500 mg by mouth every 6 (six) hours as needed for moderate pain or fever (pain).    Yes [provider]  Ascorbic Acid (VITAMIN C) 1000 MG tablet Take 1,000 mg by mouth daily.   Yes [provider]  cyanocobalamin 50 MCG tablet Take 100 mcg  by mouth daily.   Yes [provider]  ergocalciferol (VITAMIN D2) 1.25 MG (50000 UT) capsule Take 50,000 Units by mouth every 30 (thirty) days.  10/24/19  Yes [provider]  famotidine (PEPCID) 10 MG tablet Take 10 mg by mouth daily as needed for heartburn or indigestion.   Yes [provider]  folic acid (FOLVITE) 1 MG tablet Take 1 tablet (1 mg total) by mouth daily. 02/09/16  Yes [name], MD  hydroxyurea (HYDREA) 500 MG capsule Take 2 capsules (1,000 mg total) by mouth daily. May take with food to minimize GI side effects. 02/09/16  Yes [name], [name], MD  INCRUSE ELLIPTA 62.5 MCG/INH AEPB Inhale 1 puff into the lungs daily as needed (for shortness of breath). 03/17/20  Yes [provider]  Melatonin 10 MG TABS Take 10 mg by mouth at bedtime.   Yes [provider]  OXBRYTA 500 MG TABS tablet Take 1,500 mg by mouth at bedtime. 03/03/20  Yes [provider]  Oxycodone HCl 20 MG TABS Take 20 mg by mouth every 4 (four) hours as needed (pain).    Yes [provider]  sodium bicarbonate 650 MG tablet Take 650 mg by mouth daily. 10/14/19  Yes [provider]  SYMBICORT 80-4.5 MCG/ACT inhaler Inhale 2 puffs into the lungs  daily as needed (for shortness of breath). 11/24/20  Yes [provider]  albuterol (VENTOLIN HFA) 108 (90 Base) MCG/ACT inhaler Inhale into the lungs. Patient not taking: Reported on 10/16/2021 01/24/20 01/23/21  [provider]  budesonide-formoterol (SYMBICORT) 160-4.5 MCG/ACT inhaler Inhale 2 puffs into the lungs 2 (two) times daily. 05/10/18 07/11/20  [provider]  cyclobenzaprine (FLEXERIL) 10 MG tablet Take 1 tablet (10 mg total) by mouth 3 (three) times daily as needed for muscle spasms. Patient not taking: Reported on 10/16/2021 04/09/18   Street, [place], PA-C  docusate sodium (COLACE) 100 MG capsule Take 100 mg by mouth daily as needed for mild constipation. Patient  not taking: Reported on 10/16/2021    [provider]  hydroxychloroquine (PLAQUENIL) 200 MG tablet Take 200 mg by mouth daily. Patient not taking: Reported on 10/16/2021 01/13/20   [provider]    Physical Exam: Constitutional: Moderately built and nourished. Vitals:   10/16/21 0215 10/16/21 0230 10/16/21 0244 10/16/21 0330  BP: 136/80 126/61  (!) 159/85  Pulse: 82 76 75 (!) 103  Resp: 20 13 17 16   Temp:      TempSrc:      SpO2: 96%  96% 91%  Weight:      Height:       Eyes: Anicteric mild pallor. ENMT: No discharge from the ears eyes nose and mouth. Neck: No mass felt.  No neck rigidity. Respiratory: No rhonchi or crepitations. Cardiovascular: S1-S2 heard. Abdomen: Soft nontender bowel sound present. Musculoskeletal: No edema. Skin: No rash. Neurologic: Alert awake oriented time place and person.  Moves all extremities. Psychiatric: Appears normal.  Normal affect.   Labs on Admission: I have personally reviewed following labs and imaging studies  CBC: Recent Labs  Lab 10/16/21 0102  WBC 6.7  NEUTROABS 4.0  HGB 6.2*  HCT 16.7*  MCV 98.2  PLT 123456   Basic Metabolic Panel: Recent Labs  Lab 10/16/21 0102  NA 135  K 4.0  CL 111  CO2 16*  GLUCOSE 117*  BUN 34*  CREATININE 2.73*  CALCIUM 8.7*   GFR: Estimated Creatinine Clearance: 29.6 mL/min (A) (by C-G formula based on SCr of 2.73 mg/dL (H)). Liver Function Tests: Recent Labs  Lab 10/16/21 0102  AST 52*  ALT 33  ALKPHOS 120  BILITOT 1.7*  PROT 7.7  ALBUMIN 4.1   No results for input(s): LIPASE, AMYLASE in the last 168 hours. No results for input(s): AMMONIA in the last 168 hours. Coagulation Profile: No results for input(s): INR, PROTIME in the last 168 hours. Cardiac Enzymes: No results for input(s): CKTOTAL, CKMB, CKMBINDEX, TROPONINI in the last 168 hours. BNP (last 3 results) No results for input(s): PROBNP in the last 8760 hours. HbA1C: No results for input(s): HGBA1C in  the last 72 hours. CBG: No results for input(s): GLUCAP in the last 168 hours. Lipid Profile: No results for input(s): CHOL, HDL, LDLCALC, TRIG, CHOLHDL, LDLDIRECT in the last 72 hours. Thyroid Function Tests: No results for input(s): TSH, T4TOTAL, FREET4, T3FREE, THYROIDAB in the last 72 hours. Anemia Panel: Recent Labs    10/16/21 0102  RETICCTPCT 8.5*   Urine analysis:    Component Value Date/Time   COLORURINE YELLOW 07/11/2020 0220   APPEARANCEUR CLEAR 07/11/2020 0220   APPEARANCEUR Clear 10/05/2017 1600   LABSPEC 1.015 07/11/2020 0220   PHURINE 6.5 07/11/2020 0220   GLUCOSEU NEGATIVE 07/11/2020 0220   HGBUR SMALL (A) 07/11/2020 0220   BILIRUBINUR neg 03/18/2021 1513   BILIRUBINUR  Negative 10/05/2017 1600   KETONESUR NEGATIVE 07/11/2020 0220   PROTEINUR Negative 03/18/2021 1513   PROTEINUR 30 (A) 07/11/2020 0220   UROBILINOGEN 0.2 03/18/2021 1513   UROBILINOGEN 0.2 12/08/2017 1339   NITRITE neg 03/18/2021 1513   NITRITE NEGATIVE 07/11/2020 0220   LEUKOCYTESUR Negative 03/18/2021 1513   LEUKOCYTESUR NEGATIVE 07/11/2020 0220   Sepsis Labs: @LABRCNTIP (procalcitonin:4,lacticidven:4) )No results found for this or any previous visit (from the past 240 hour(s)).   Radiological Exams on Admission: No results found.   Assessment/Plan Principal Problem:   Sickle cell pain crisis (HCC) Active Problems:   CKD (chronic kidney disease) stage 2, GFR 60-89 ml/min   History of aneurysm   Anemia of chronic disease    Sickle cell pain crisis -Place patient on Dilaudid PCA.  Patient is receiving packed red blood cell transfusion.  Avoiding NSAIDs due to renal failure. Worsening anemia will hold hydroxyurea patient is receiving 1 unit of PRBC transfusion follow CBC. Chronic and disease stage III with mild worsening of his usual creatinine.  Recheck metabolic panel after transfusion.  Avoid nephrotoxins. History of sarcoidosis on Plaquenil which patient has not taken for the last  2 weeks. History of brain aneurysms. Elevated blood pressure reading we will keep patient on as needed IV hydralazine follow blood pressure trends.  COVID test is pending.   Since patient will require Dilaudid PCA for pain control and will need close monitoring for his worsening anemia and creatinine.   DVT prophylaxis: Lovenox. Code Status: Full code. Family Communication: Discussed with patient. Disposition Plan: Home. Consults called: None. Admission status: Inpatient.   Rise Patience MD [facility] Pager [phone].  If 7PM-7AM, please contact night-coverage [email or web address] Password [facility]  10/16/2021, 4:26 AM

## 2021-10-16 NOTE — Plan of Care (Signed)
  Problem: Education: Goal: Knowledge of General Education information will improve Description Including pain rating scale, medication(s)/side effects and non-pharmacologic comfort measures Outcome: Progressing   Problem: Health Behavior/Discharge Planning: Goal: Ability to manage health-related needs will improve Outcome: Progressing   

## 2021-10-16 NOTE — ED Notes (Signed)
Date and time results received: 10/16/21 0255 (use smartphrase ".now" to insert current time)  Test: hemoglobin Critical Value: 6.2  Name of Provider Notified: Dr. Rhunette Croft  Orders Received? Or Actions Taken?: Orders Received - See Orders for details

## 2021-10-16 NOTE — ED Notes (Signed)
Hospitalist in with pt at this time  

## 2021-10-16 NOTE — ED Notes (Signed)
Pt. Eating chips and drinking soda.

## 2021-10-16 NOTE — ED Triage Notes (Signed)
Pt reports SCC pain crisiss with pain worst in back. Pain has  been worsening over last month.

## 2021-10-16 NOTE — ED Provider Notes (Signed)
[facility] DEPT Provider Note   CSN: [id number] Arrival date & time: 10/16/21  0042     History  Chief Complaint  Patient presents with   Sickle Cell Pain Crisis    Shawn Adams is a 46 y.o. male.  HPI    Pt comes in with cc of sickle cell pain. Reports 1 month hx of pain in his hips, back and leg, typical site for sickle cell pain. Reports that the last 2-3 days he is having 1-2 loose BM and he feels dehydrated. Taking 4 x percocets with transient relief. Pt denies nausea, emesis, fevers, chills, chest pains, shortness of breath, headaches, abdominal pain, uti like symptoms.    Home Medications Prior to Admission medications   Medication Sig Start Date End Date Taking? Authorizing Provider  acetaminophen (TYLENOL) 500 MG tablet Take 500 mg by mouth every 6 (six) hours as needed for moderate pain or fever (pain).    Yes [provider]  Ascorbic Acid (VITAMIN C) 1000 MG tablet Take 1,000 mg by mouth daily.   Yes [provider]  cyanocobalamin 50 MCG tablet Take 100 mcg by mouth daily.   Yes [provider]  ergocalciferol (VITAMIN D2) 1.25 MG (50000 UT) capsule Take 50,000 Units by mouth every 30 (thirty) days.  10/24/19  Yes [provider]  famotidine (PEPCID) 10 MG tablet Take 10 mg by mouth daily as needed for heartburn or indigestion.   Yes [provider]  folic acid (FOLVITE) 1 MG tablet Take 1 tablet (1 mg total) by mouth daily. 02/09/16  Yes [name], MD  hydroxyurea (HYDREA) 500 MG capsule Take 2 capsules (1,000 mg total) by mouth daily. May take with food to minimize GI side effects. 02/09/16  Yes [name], [name], MD  INCRUSE ELLIPTA 62.5 MCG/INH AEPB Inhale 1 puff into the lungs daily as needed (for shortness of breath). 03/17/20  Yes [provider]  Melatonin 10 MG TABS Take 10 mg by mouth at bedtime.   Yes [provider]  OXBRYTA 500 MG TABS tablet Take  1,500 mg by mouth at bedtime. 03/03/20  Yes [provider]  Oxycodone HCl 20 MG TABS Take 20 mg by mouth every 4 (four) hours as needed (pain).    Yes [provider]  sodium bicarbonate 650 MG tablet Take 650 mg by mouth daily. 10/14/19  Yes [provider]  SYMBICORT 80-4.5 MCG/ACT inhaler Inhale 2 puffs into the lungs daily as needed (for shortness of breath). 11/24/20  Yes [provider]  albuterol (VENTOLIN HFA) 108 (90 Base) MCG/ACT inhaler Inhale into the lungs. Patient not taking: Reported on 10/16/2021 01/24/20 01/23/21  [provider]  budesonide-formoterol (SYMBICORT) 160-4.5 MCG/ACT inhaler Inhale 2 puffs into the lungs 2 (two) times daily. 05/10/18 07/11/20  [provider]  cyclobenzaprine (FLEXERIL) 10 MG tablet Take 1 tablet (10 mg total) by mouth 3 (three) times daily as needed for muscle spasms. Patient not taking: Reported on 10/16/2021 04/09/18   Street, [place], PA-C  docusate sodium (COLACE) 100 MG capsule Take 100 mg by mouth daily as needed for mild constipation. Patient not taking: Reported on 10/16/2021    [provider]  hydroxychloroquine (PLAQUENIL) 200 MG tablet Take 200 mg by mouth daily. Patient not taking: Reported on 10/16/2021 01/13/20   [provider]      Allergies    Patient has no known allergies.    Review of Systems   Review of Systems  Physical Exam Updated Vital Signs BP (!) 159/85    Pulse (!) 103    Temp 98.2 F (36.8 C) (Oral)    Resp 16    Ht 6' (1.829 m)    Wt 61.2 kg    SpO2 91%    BMI 18.31 kg/m  Physical Exam Vitals and nursing note reviewed.  Constitutional:      Appearance: He is well-developed.  HENT:     Head: Atraumatic.  Eyes:     General: Scleral icterus present.  Cardiovascular:     Rate and Rhythm: Normal rate.  Pulmonary:     Effort: Pulmonary effort is normal.  Musculoskeletal:     Cervical back: Neck supple.  Skin:    General: Skin is warm.   Neurological:     Mental Status: He is alert and oriented to person, place, and time.    ED Results / Procedures / Treatments   Labs (all labs ordered are listed, but only abnormal results are displayed) Labs Reviewed  COMPREHENSIVE METABOLIC PANEL - Abnormal; Notable for the following components:      Result Value   CO2 16 (*)    Glucose, Bld 117 (*)    BUN 34 (*)    Creatinine, Ser 2.73 (*)    Calcium 8.7 (*)    AST 52 (*)    Total Bilirubin 1.7 (*)    GFR, Estimated 28 (*)    All other components within normal limits  CBC WITH DIFFERENTIAL/PLATELET - Abnormal; Notable for the following components:   RBC 1.70 (*)    Hemoglobin 6.2 (*)    HCT 16.7 (*)    MCH 36.5 (*)    MCHC 37.1 (*)    RDW 23.4 (*)    nRBC 5.2 (*)    All other components within normal limits  RETICULOCYTES - Abnormal; Notable for the following components:   Retic Ct Pct 8.5 (*)    RBC. 1.70 (*)    Immature Retic Fract 36.1 (*)    All other components within normal limits  RESP PANEL BY RT-PCR (FLU A&B, COVID) ARPGX2  TYPE AND SCREEN  PREPARE RBC (CROSSMATCH)    EKG None  Radiology No results found.  Procedures .Critical Care Performed by: Varney Biles, MD Authorized by: Varney Biles, MD   Critical care provider statement:    Critical care time (minutes):  30   Critical care was necessary to treat or prevent imminent or life-threatening deterioration of the following conditions:  Circulatory failure, renal failure and dehydration   Critical care was time spent personally by me on the following activities:  Development of treatment plan with patient or surrogate, discussions with consultants, evaluation of patient's response to treatment, examination of patient, ordering and review of laboratory studies, ordering and review of radiographic studies, ordering and performing treatments and interventions, pulse oximetry, re-evaluation of patient's condition and review of old charts     Medications Ordered in ED Medications  oxyCODONE (Oxy IR/ROXICODONE) immediate release tablet 15 mg (has no administration in time range)  diphenhydrAMINE (BENADRYL) capsule 25-50 mg (has no administration in time range)  ondansetron (ZOFRAN) injection 4 mg (4 mg Intravenous Given 10/16/21 0332)  0.45 % sodium chloride infusion ( Intravenous New Bag/Given 10/16/21 0229)  0.9 %  sodium chloride infusion (has no administration in time range)  HYDROmorphone (DILAUDID) injection 2 mg (2 mg Intravenous Given 10/16/21 0223)  HYDROmorphone (DILAUDID) injection 2 mg (2 mg Intravenous Given 10/16/21 0333)  ketorolac (TORADOL) 15 MG/ML injection  15 mg (15 mg Intravenous Given 10/16/21 0229)    ED Course/ Medical Decision Making/ A&P Clinical Course as of 10/16/21 0411  Sat Oct 16, 2021  0410 Hemoglobin(!!): 6.2 Hemoglobin is 6.2.  Patient indicates that he has been having some dizziness.  He does think he needs some blood right now.  He has required transfusion in the past, and typically they transfuse him once his hemoglobin gets to 6.5 range.  His hemoglobin normally runs between 7-9.  He is consented for blood transfusion. We will give him 1 unit now.  Will defer further transfusion to admitting service and sickle cell team. [AN]    Clinical Course User Index [AN] Varney Biles, MD                           Medical Decision Making Problems Addressed: Acute renal failure superimposed on chronic kidney disease, unspecified CKD stage, unspecified acute renal failure type [facility]): chronic illness or injury with exacerbation, progression, or side effects of treatment Sickle cell pain crisis [facility]): chronic illness or injury with exacerbation, progression, or side effects of treatment Symptomatic anemia: acute illness or injury that poses a threat to life or bodily functions  Amount and/or Complexity of Data Reviewed External Data Reviewed: labs and notes.    Details: Reviewed notes from [facility] sickle cell team about patient's care Labs: ordered. Decision-making details documented in ED Course.  Risk Prescription drug management. Parenteral controlled substances. Decision regarding hospitalization. Risk Details: Sickle cell anemia with complication, including symptomatic anemia requiring transfusion.  High risk for morbidity and mortality.   Pt comes in with sickle cell related pain.  VSS and WNL -  hemodynamically stable  Pain appears vaso-occlusive tpye and typical of previous pain. Will start mild hydration with 1/2 NS while patient is getting her workup. Appropriate labs ordered. Pain control initiated. We will reassess patient after 3 doses. Goal is to break the pain and see if patient feels comfortable going home.  Currently, there is no signs of severe decompensation clinically. Will continue to monitor closely. If we are unable to control the pain, we will admit the patient.   Final Clinical Impression(s) / ED Diagnoses Final diagnoses:  Acute renal failure superimposed on chronic kidney disease, unspecified CKD stage, unspecified acute renal failure type ([place])  Sickle cell pain crisis ([place])  Symptomatic anemia    Rx / DC Orders ED Discharge Orders     None         Varney Biles, MD 10/16/21 [phone]

## 2021-10-16 NOTE — ED Notes (Signed)
Pt. Found multiple times without his oxygen. Pt. Encouraged to keep oxygen on to keep 02 sats above 90%.

## 2021-10-16 NOTE — ED Notes (Signed)
No PCA pump in ED to administer medication.

## 2021-10-16 NOTE — ED Notes (Signed)
Pt O2 sats decreased to 89%, O2 at 2L [place] placed on pt and sats increased to 94-95%

## 2021-10-17 DIAGNOSIS — N182 Chronic kidney disease, stage 2 (mild): Secondary | ICD-10-CM

## 2021-10-17 DIAGNOSIS — Z8679 Personal history of other diseases of the circulatory system: Secondary | ICD-10-CM

## 2021-10-17 DIAGNOSIS — D649 Anemia, unspecified: Secondary | ICD-10-CM

## 2021-10-17 DIAGNOSIS — N189 Chronic kidney disease, unspecified: Secondary | ICD-10-CM

## 2021-10-17 DIAGNOSIS — D5700 Hb-SS disease with crisis, unspecified: Principal | ICD-10-CM

## 2021-10-17 DIAGNOSIS — D638 Anemia in other chronic diseases classified elsewhere: Secondary | ICD-10-CM

## 2021-10-17 DIAGNOSIS — N179 Acute kidney failure, unspecified: Secondary | ICD-10-CM

## 2021-10-17 LAB — CBC
HCT: 18 % — ABNORMAL LOW (ref 39.0–52.0)
Hemoglobin: 6.7 g/dL — CL (ref 13.0–17.0)
MCH: 35.6 pg — ABNORMAL HIGH (ref 26.0–34.0)
MCHC: 37.2 g/dL — ABNORMAL HIGH (ref 30.0–36.0)
MCV: 95.7 fL (ref 80.0–100.0)
Platelets: 288 10*3/uL (ref 150–400)
RBC: 1.88 MIL/uL — ABNORMAL LOW (ref 4.22–5.81)
RDW: 23.3 % — ABNORMAL HIGH (ref 11.5–15.5)
WBC: 5.3 10*3/uL (ref 4.0–10.5)
nRBC: 16 % — ABNORMAL HIGH (ref 0.0–0.2)

## 2021-10-17 MED ORDER — HYDROXYUREA 500 MG PO CAPS
1000.0000 mg | ORAL_CAPSULE | Freq: Every day | ORAL | Status: DC
  Administered 2021-10-17 – 2021-10-22 (×6): 1000 mg via ORAL
  Filled 2021-10-17 (×6): qty 2

## 2021-10-17 MED ORDER — OXYCODONE HCL 5 MG PO TABS
20.0000 mg | ORAL_TABLET | ORAL | Status: DC | PRN
Start: 2021-10-17 — End: 2021-10-22
  Administered 2021-10-18 – 2021-10-22 (×8): 20 mg via ORAL
  Filled 2021-10-17 (×8): qty 4

## 2021-10-17 NOTE — Progress Notes (Addendum)
Patient ID: Shawn Adams, male   DOB: 1976/07/30, 46 y.o.   MRN: [id number] Subjective: Shawn Adams is a 46 y.o. male with history of sarcoidosis, sickle cell anemia, chronic kidney disease stage III, chronic pain syndrome, opiate dependence and tolerance, admitted yesterday for sickle cell pain crisis.  Patient's hemoglobin dropped to a nadir of 5.3 while in the emergency room yesterday and was transfused with 1 unit of packed red blood cell.  Today his hemoglobin is 6.7.  He has no new complaint today.  He said his pain has slightly improved but still significant, he thinks his pain is at around 7/10, constant and throbbing mostly in his lower extremities and lower back.  He denies any fever, cough, chest pain, shortness of breath, nausea, vomiting or diarrhea.  No urinary symptoms.   Objective:  Vital signs in last 24 hours:  Vitals:   10/17/21 0853 10/17/21 0938 10/17/21 1154 10/17/21 1315  BP:  115/75  (!) 117/59  Pulse:  73  70  Resp:  16 11 16   Temp:  98.1 F (36.7 C)  98.2 F (36.8 C)  TempSrc:  Oral  Oral  SpO2: 96% 98% 91% 94%  Weight:      Height:        Intake/Output from previous day:   Intake/Output Summary (Last 24 hours) at 10/17/2021 1356 Last data filed at 10/17/2021 1315 Gross per 24 hour  Intake 1380 ml  Output 2550 ml  Net -1170 ml    Physical Exam: General: Alert, awake, oriented x3, in no acute distress.  HEENT: [place]/AT PEERL, EOMI Neck: Trachea midline,  no masses, no thyromegal,y no JVD, no carotid bruit OROPHARYNX:  Moist, No exudate/ erythema/lesions.  Heart: Regular rate and rhythm, without murmurs, rubs, gallops, PMI non-displaced, no heaves or thrills on palpation.  Lungs: Clear to auscultation, no wheezing or rhonchi noted. No increased vocal fremitus resonant to percussion  Abdomen: Soft, nontender, nondistended, positive bowel sounds, no masses no hepatosplenomegaly noted..  Neuro: No focal neurological deficits noted cranial nerves II through  XII grossly intact. DTRs 2+ bilaterally upper and lower extremities. Strength 5 out of 5 in bilateral upper and lower extremities. Musculoskeletal: No warm swelling or erythema around joints, no spinal tenderness noted. Psychiatric: Patient alert and oriented x3, good insight and cognition, good recent to remote recall. Lymph node survey: No cervical axillary or inguinal lymphadenopathy noted.  Lab Results:  Basic Metabolic Panel:    Component Value Date/Time   NA 135 10/16/2021 0102   NA 139 12/16/2020 1434   K 4.0 10/16/2021 0102   CL 111 10/16/2021 0102   CO2 16 (L) 10/16/2021 0102   BUN 34 (H) 10/16/2021 0102   BUN 31 (H) 12/16/2020 1434   CREATININE 2.44 (H) 10/16/2021 0456   CREATININE 1.39 (H) 05/11/2016 1100   GLUCOSE 117 (H) 10/16/2021 0102   CALCIUM 8.7 (L) 10/16/2021 0102   CBC:    Component Value Date/Time   WBC 5.3 10/17/2021 1128   HGB 6.7 (LL) 10/17/2021 1128   HGB 8.5 (L) 12/16/2020 1434   HCT 18.0 (L) 10/17/2021 1128   HCT 23.3 (L) 12/16/2020 1434   PLT 288 10/17/2021 1128   PLT 371 12/16/2020 1434   MCV 95.7 10/17/2021 1128   MCV 118 (H) 12/16/2020 1434   NEUTROABS 4.0 10/16/2021 0102   NEUTROABS 2.8 12/16/2020 1434   LYMPHSABS 1.9 10/16/2021 0102   LYMPHSABS 0.9 12/16/2020 1434   MONOABS 0.5 10/16/2021 0102   EOSABS 0.2 10/16/2021 0102  EOSABS 0.0 12/16/2020 1434   BASOSABS 0.1 10/16/2021 0102   BASOSABS 0.0 12/16/2020 1434    Recent Results (from the past 240 hour(s))  Resp Panel by RT-PCR (Flu A&B, Covid) Nasopharyngeal Swab     Status: None   Collection Time: 10/16/21  3:17 AM   Specimen: Nasopharyngeal Swab; Nasopharyngeal(NP) swabs in vial transport medium  Result Value Ref Range Status   SARS Coronavirus 2 by RT PCR NEGATIVE NEGATIVE Final    Comment: (NOTE) SARS-CoV-2 target nucleic acids are NOT DETECTED.  The SARS-CoV-2 RNA is generally detectable in upper respiratory specimens during the acute phase of infection. The  lowest concentration of SARS-CoV-2 viral copies this assay can detect is 138 copies/mL. A negative result does not preclude SARS-Cov-2 infection and should not be used as the sole basis for treatment or other patient management decisions. A negative result may occur with  improper specimen collection/handling, submission of specimen other than nasopharyngeal swab, presence of viral mutation(s) within the areas targeted by this assay, and inadequate number of viral copies(<138 copies/mL). A negative result must be combined with clinical observations, patient history, and epidemiological information. The expected result is Negative.  Fact Sheet for Patients:  EntrepreneurPulse.com.au  Fact Sheet for Healthcare Providers:  IncredibleEmployment.be  This test is no t yet approved or cleared by the [place] FDA and  has been authorized for detection and/or diagnosis of SARS-CoV-2 by FDA under an Emergency Use Authorization (EUA). This EUA will remain  in effect (meaning this test can be used) for the duration of the COVID-19 declaration under Section 564(b)(1) of the Act, 21 U.S.C.section 360bbb-3(b)(1), unless the authorization is terminated  or revoked sooner.       Influenza A by PCR NEGATIVE NEGATIVE Final   Influenza B by PCR NEGATIVE NEGATIVE Final    Comment: (NOTE) The Xpert Xpress SARS-CoV-2/FLU/RSV plus assay is intended as an aid in the diagnosis of influenza from Nasopharyngeal swab specimens and should not be used as a sole basis for treatment. Nasal washings and aspirates are unacceptable for Xpert Xpress SARS-CoV-2/FLU/RSV testing.  Fact Sheet for Patients: EntrepreneurPulse.com.au  Fact Sheet for Healthcare Providers: IncredibleEmployment.be  This test is not yet approved or cleared by the [place] FDA and has been authorized for detection and/or diagnosis of SARS-CoV-2 by FDA under  an Emergency Use Authorization (EUA). This EUA will remain in effect (meaning this test can be used) for the duration of the COVID-19 declaration under Section 564(b)(1) of the Act, 21 U.S.C. section 360bbb-3(b)(1), unless the authorization is terminated or revoked.  Performed at [facility], [place] [address], [place], [place] [address]     Studies/Results: No results found.  Medications: Scheduled Meds:  enoxaparin (LOVENOX) injection  30 mg Subcutaneous [id number]   folic acid  1 mg Oral Daily   HYDROmorphone   Intravenous Q4H   melatonin  10 mg Oral QHS   mometasone-formoterol  2 puff Inhalation BID   senna-docusate  1 tablet Oral BID   sodium bicarbonate  650 mg Oral Daily   cyanocobalamin  100 mcg Oral Daily   voxelotor  1,500 mg Oral QHS   Continuous Infusions: PRN Meds:.diphenhydrAMINE, famotidine, HYDROmorphone (DILAUDID) injection, labetalol, naloxone **AND** sodium chloride flush, ondansetron (ZOFRAN) IV, polyethylene glycol  Consultants: None  Procedures: None  Antibiotics: None  Assessment/Plan: Principal Problem:   Sickle cell pain crisis (HCC) Active Problems:   CKD (chronic kidney disease) stage 2, GFR 60-89 ml/min   History of aneurysm   Anemia  of chronic disease  Hb Sickle Cell Disease with Pain crisis: We will reduce IV fluid to [facility] today, continue weight based Dilaudid PCA at current dose setting, IV Toradol is contraindicated due to CKD.  Add his home pain medications. Monitor vitals very closely, Re-evaluate pain scale regularly, 2 L of Oxygen by [place]. Acute on CKD stage III: Serum creatinine worsened to 2.73 yesterday, patient has been hydrated.  We will repeat labs.  Avoid all nephrotoxins. Anemia of Chronic Disease: Patient is status posttransfusion of 1 unit of packed red blood cells, repeat hemoglobin is 6.7 today which is close to patient's baseline.  We will monitor closely and possibly transfuse again if hemoglobin drops below 6.   Restart hydroxyurea. Chronic pain Syndrome: Restart and continue home pain medications. History of sarcoidosis: Clinically stable.  Patient is supposed to be on Plaquenil but he is not taking it so the pulmonologist has discontinued this medication per record. Patient encouraged to follow-up with his pulmonologist as outpatient. Elevated blood pressure: Continue home medications.  Code Status: Full Code Family Communication: N/A Disposition Plan: Not yet ready for discharge  Shawn Adams  If 7PM-7AM, please contact night-coverage.  10/17/2021, 1:56 PM  LOS: 1 day

## 2021-10-18 LAB — CBC WITH DIFFERENTIAL/PLATELET
Abs Immature Granulocytes: 0.06 10*3/uL (ref 0.00–0.07)
Basophils Absolute: 0 10*3/uL (ref 0.0–0.1)
Basophils Relative: 1 %
Eosinophils Absolute: 0.2 10*3/uL (ref 0.0–0.5)
Eosinophils Relative: 4 %
HCT: 17.6 % — ABNORMAL LOW (ref 39.0–52.0)
Hemoglobin: 6.6 g/dL — CL (ref 13.0–17.0)
Immature Granulocytes: 1 %
Lymphocytes Relative: 28 %
Lymphs Abs: 1.4 10*3/uL (ref 0.7–4.0)
MCH: 36.3 pg — ABNORMAL HIGH (ref 26.0–34.0)
MCHC: 37.5 g/dL — ABNORMAL HIGH (ref 30.0–36.0)
MCV: 96.7 fL (ref 80.0–100.0)
Monocytes Absolute: 0.5 10*3/uL (ref 0.1–1.0)
Monocytes Relative: 10 %
Neutro Abs: 2.9 10*3/uL (ref 1.7–7.7)
Neutrophils Relative %: 56 %
Platelets: 285 10*3/uL (ref 150–400)
RBC: 1.82 MIL/uL — ABNORMAL LOW (ref 4.22–5.81)
RDW: 23.2 % — ABNORMAL HIGH (ref 11.5–15.5)
WBC: 5.2 10*3/uL (ref 4.0–10.5)
nRBC: 18.3 % — ABNORMAL HIGH (ref 0.0–0.2)

## 2021-10-18 LAB — BASIC METABOLIC PANEL
Anion gap: 7 (ref 5–15)
BUN: 29 mg/dL — ABNORMAL HIGH (ref 6–20)
CO2: 17 mmol/L — ABNORMAL LOW (ref 22–32)
Calcium: 8.8 mg/dL — ABNORMAL LOW (ref 8.9–10.3)
Chloride: 113 mmol/L — ABNORMAL HIGH (ref 98–111)
Creatinine, Ser: 2.05 mg/dL — ABNORMAL HIGH (ref 0.61–1.24)
GFR, Estimated: 40 mL/min — ABNORMAL LOW (ref >60)
Glucose, Bld: 93 mg/dL (ref 70–99)
Potassium: 3.7 mmol/L (ref 3.5–5.1)
Sodium: 137 mmol/L (ref 135–145)

## 2021-10-18 LAB — HEPATIC FUNCTION PANEL
ALT: 40 U/L (ref 0–44)
AST: 49 U/L — ABNORMAL HIGH (ref 15–41)
Albumin: 3.5 g/dL (ref 3.5–5.0)
Alkaline Phosphatase: 109 U/L (ref 38–126)
Bilirubin, Direct: 0.3 mg/dL — ABNORMAL HIGH (ref 0.0–0.2)
Indirect Bilirubin: 1.1 mg/dL — ABNORMAL HIGH (ref 0.3–0.9)
Total Bilirubin: 1.4 mg/dL — ABNORMAL HIGH (ref 0.3–1.2)
Total Protein: 7 g/dL (ref 6.5–8.1)

## 2021-10-18 LAB — LACTATE DEHYDROGENASE: LDH: 477 U/L — ABNORMAL HIGH (ref 98–192)

## 2021-10-18 MED ORDER — ENOXAPARIN SODIUM 40 MG/0.4ML IJ SOSY
40.0000 mg | PREFILLED_SYRINGE | Freq: Every day | INTRAMUSCULAR | Status: DC
  Administered 2021-10-18 – 2021-10-22 (×5): 40 mg via SUBCUTANEOUS
  Filled 2021-10-18 (×5): qty 0.4

## 2021-10-18 MED ORDER — UMECLIDINIUM BROMIDE 62.5 MCG/ACT IN AEPB
1.0000 | INHALATION_SPRAY | Freq: Every day | RESPIRATORY_TRACT | Status: DC
  Administered 2021-10-19 – 2021-10-22 (×4): 1 via RESPIRATORY_TRACT
  Filled 2021-10-18: qty 7

## 2021-10-18 NOTE — Progress Notes (Signed)
Subjective: Shawn Adams is a 46 year old male with a medical history significant for sickle cell disease, sarcoidosis, chronic kidney disease stage III, chronic pain syndrome, opiate dependence and tolerance that was admitted for sickle cell pain crisis.  Today, patient's hemoglobin has improved to 6.6 g/dL.  He is status post 2 units PRBCs.  Patient continues to complain of pain primarily to low back and lower extremities.  He rates his pain as 7/10.  He denies any headache, chest pain, shortness of breath, urinary symptoms, nausea, vomiting, or diarrhea.  Objective:  Vital signs in last 24 hours:  Vitals:   10/18/21 0926 10/18/21 1205 10/18/21 1258 10/18/21 1420  BP: 134/67 116/80  133/72  Pulse: 77 67  80  Resp: 16 12 10 12   Temp: 98.1 F (36.7 C) 97.7 F (36.5 C)  97.7 F (36.5 C)  TempSrc: Oral Oral  Oral  SpO2: 94% 95% 95% 95%  Weight:      Height:        Intake/Output from previous day:   Intake/Output Summary (Last 24 hours) at 10/18/2021 1427 Last data filed at 10/18/2021 1327 Gross per 24 hour  Intake 1521.8 ml  Output 1750 ml  Net -228.2 ml    Physical Exam: General: Alert, awake, oriented x3, in no acute distress.  HEENT: [place]/AT PEERL, EOMI Neck: Trachea midline,  no masses, no thyromegal,y no JVD, no carotid bruit OROPHARYNX:  Moist, No exudate/ erythema/lesions.  Heart: Regular rate and rhythm, without murmurs, rubs, gallops, PMI non-displaced, no heaves or thrills on palpation.  Lungs: Clear to auscultation, no wheezing or rhonchi noted. No increased vocal fremitus resonant to percussion  Abdomen: Soft, nontender, nondistended, positive bowel sounds, no masses no hepatosplenomegaly noted..  Neuro: No focal neurological deficits noted cranial nerves II through XII grossly intact. DTRs 2+ bilaterally upper and lower extremities. Strength 5 out of 5 in bilateral upper and lower extremities. Musculoskeletal: No warm swelling or erythema around joints, no spinal  tenderness noted. Psychiatric: Patient alert and oriented x3, good insight and cognition, good recent to remote recall. Lymph node survey: No cervical axillary or inguinal lymphadenopathy noted.  Lab Results:  Basic Metabolic Panel:    Component Value Date/Time   NA 137 10/18/2021 0405   NA 139 12/16/2020 1434   K 3.7 10/18/2021 0405   CL 113 (H) 10/18/2021 0405   CO2 17 (L) 10/18/2021 0405   BUN 29 (H) 10/18/2021 0405   BUN 31 (H) 12/16/2020 1434   CREATININE 2.05 (H) 10/18/2021 0405   CREATININE 1.39 (H) 05/11/2016 1100   GLUCOSE 93 10/18/2021 0405   CALCIUM 8.8 (L) 10/18/2021 0405   CBC:    Component Value Date/Time   WBC 5.2 10/18/2021 0405   HGB 6.6 (LL) 10/18/2021 0405   HGB 8.5 (L) 12/16/2020 1434   HCT 17.6 (L) 10/18/2021 0405   HCT 23.3 (L) 12/16/2020 1434   PLT 285 10/18/2021 0405   PLT 371 12/16/2020 1434   MCV 96.7 10/18/2021 0405   MCV 118 (H) 12/16/2020 1434   NEUTROABS 2.9 10/18/2021 0405   NEUTROABS 2.8 12/16/2020 1434   LYMPHSABS 1.4 10/18/2021 0405   LYMPHSABS 0.9 12/16/2020 1434   MONOABS 0.5 10/18/2021 0405   EOSABS 0.2 10/18/2021 0405   EOSABS 0.0 12/16/2020 1434   BASOSABS 0.0 10/18/2021 0405   BASOSABS 0.0 12/16/2020 1434    Recent Results (from the past 240 hour(s))  Resp Panel by RT-PCR (Flu A&B, Covid) Nasopharyngeal Swab     Status: None  Collection Time: 10/16/21  3:17 AM   Specimen: Nasopharyngeal Swab; Nasopharyngeal(NP) swabs in vial transport medium  Result Value Ref Range Status   SARS Coronavirus 2 by RT PCR NEGATIVE NEGATIVE Final    Comment: (NOTE) SARS-CoV-2 target nucleic acids are NOT DETECTED.  The SARS-CoV-2 RNA is generally detectable in upper respiratory specimens during the acute phase of infection. The lowest concentration of SARS-CoV-2 viral copies this assay can detect is 138 copies/mL. A negative result does not preclude SARS-Cov-2 infection and should not be used as the sole basis for treatment or other  patient management decisions. A negative result may occur with  improper specimen collection/handling, submission of specimen other than nasopharyngeal swab, presence of viral mutation(s) within the areas targeted by this assay, and inadequate number of viral copies(<138 copies/mL). A negative result must be combined with clinical observations, patient history, and epidemiological information. The expected result is Negative.  Fact Sheet for Patients:  EntrepreneurPulse.com.au  Fact Sheet for Healthcare Providers:  IncredibleEmployment.be  This test is no t yet approved or cleared by the [place] FDA and  has been authorized for detection and/or diagnosis of SARS-CoV-2 by FDA under an Emergency Use Authorization (EUA). This EUA will remain  in effect (meaning this test can be used) for the duration of the COVID-19 declaration under Section 564(b)(1) of the Act, 21 U.S.C.section 360bbb-3(b)(1), unless the authorization is terminated  or revoked sooner.       Influenza A by PCR NEGATIVE NEGATIVE Final   Influenza B by PCR NEGATIVE NEGATIVE Final    Comment: (NOTE) The Xpert Xpress SARS-CoV-2/FLU/RSV plus assay is intended as an aid in the diagnosis of influenza from Nasopharyngeal swab specimens and should not be used as a sole basis for treatment. Nasal washings and aspirates are unacceptable for Xpert Xpress SARS-CoV-2/FLU/RSV testing.  Fact Sheet for Patients: EntrepreneurPulse.com.au  Fact Sheet for Healthcare Providers: IncredibleEmployment.be  This test is not yet approved or cleared by the [place] FDA and has been authorized for detection and/or diagnosis of SARS-CoV-2 by FDA under an Emergency Use Authorization (EUA). This EUA will remain in effect (meaning this test can be used) for the duration of the COVID-19 declaration under Section 564(b)(1) of the Act, 21 U.S.C. section  360bbb-3(b)(1), unless the authorization is terminated or revoked.  Performed at [facility], [place] [address] [place], [place] [address]     Studies/Results: No results found.  Medications: Scheduled Meds:  enoxaparin (LOVENOX) injection  40 mg Subcutaneous Daily   folic acid  1 mg Oral Daily   HYDROmorphone   Intravenous Q4H   hydroxyurea  1,000 mg Oral Daily   melatonin  10 mg Oral QHS   mometasone-formoterol  2 puff Inhalation BID   senna-docusate  1 tablet Oral BID   sodium bicarbonate  650 mg Oral Daily   umeclidinium bromide  1 puff Inhalation Daily   cyanocobalamin  100 mcg Oral Daily   voxelotor  1,500 mg Oral QHS   Continuous Infusions: PRN Meds:.diphenhydrAMINE, famotidine, labetalol, naloxone **AND** sodium chloride flush, ondansetron (ZOFRAN) IV, oxyCODONE, polyethylene glycol  Consultants: None  Procedures: none  Antibiotics: none  Assessment/Plan: Principal Problem:   Sickle cell pain crisis (HCC) Active Problems:   Symptomatic anemia   Acute renal failure superimposed on chronic kidney disease (HCC)   CKD (chronic kidney disease) stage 2, GFR 60-89 ml/min   History of aneurysm   Anemia of chronic disease  Sickle cell disease with pain crisis: Continue IV fluids at  KVO Continue IV Dilaudid PCA per full dose IV Toradol contraindicated due to history of CKD Oxycodone 20 mg every 4 hours as needed for severe breakthrough pain Monitor vital signs very closely, reevaluate pain scale regularly, and supplemental oxygen as needed  Acute on chronic kidney disease stage III: Serum creatinine improving.  Avoid all nephrotoxins.  Continue to trend creatinine.  Labs in AM.  Anemia of chronic disease: Today, patient's hemoglobin is 6.6 g/dL.  He is status post 1 unit PRBCs.  Continue to monitor closely.  Will transfuse if patient's hemoglobin drops below 6.  Chronic pain syndrome: Continue home medications  History of  sarcoidosis: Patient clinically stable at this time.  Patient has not been taking Plaquenil, discontinued per pulmonologist.  Patient encouraged to follow-up with pulmonologist as an outpatient.  Elevated blood pressure: Stable.  Continue home medications.  Moderate persistent asthma: Continue home medications   Code Status: Full Code Family Communication: N/A Disposition Plan: Not yet ready for discharge  [place], MSN, FNP-C Patient [place] [address] [place], [place] [address] [phone]  If 5PM-8AM, please contact night-coverage.  10/18/2021, 2:27 PM  LOS: 2 days

## 2021-10-18 NOTE — Progress Notes (Signed)
Patient's hemoglobin went down from 6.7 to 6.6 this morning. Notified [place], NP. Attending was also aware of patient's hemoglobin yesterday per day RN and was told to keep monitoring.

## 2021-10-18 NOTE — Progress Notes (Signed)
Transition of Care [facility]) Screening Note  Patient Details  Name: Shawn Adams Date of Birth: May 25, 1976  [facility] CM/SW Contact:    Ewing Schlein, LCSW Phone Number: 10/18/2021, 10:36 AM  Transition of Care Department [facility]) has reviewed patient and no TOC needs have been identified at this time. We will continue to monitor patient advancement through interdisciplinary progression rounds. If new patient transition needs arise, please place a TOC consult.

## 2021-10-19 DIAGNOSIS — D5700 Hb-SS disease with crisis, unspecified: Secondary | ICD-10-CM | POA: Diagnosis not present

## 2021-10-19 LAB — CBC
HCT: 17.7 % — ABNORMAL LOW (ref 39.0–52.0)
Hemoglobin: 6.6 g/dL — CL (ref 13.0–17.0)
MCH: 36.5 pg — ABNORMAL HIGH (ref 26.0–34.0)
MCHC: 37.3 g/dL — ABNORMAL HIGH (ref 30.0–36.0)
MCV: 97.8 fL (ref 80.0–100.0)
Platelets: 302 10*3/uL (ref 150–400)
RBC: 1.81 MIL/uL — ABNORMAL LOW (ref 4.22–5.81)
RDW: 24.3 % — ABNORMAL HIGH (ref 11.5–15.5)
WBC: 5.5 10*3/uL (ref 4.0–10.5)
nRBC: 14.5 % — ABNORMAL HIGH (ref 0.0–0.2)

## 2021-10-19 LAB — PREPARE RBC (CROSSMATCH)

## 2021-10-19 LAB — HEMOGLOBIN AND HEMATOCRIT, BLOOD
HCT: 20.8 % — ABNORMAL LOW (ref 39.0–52.0)
Hemoglobin: 7.6 g/dL — ABNORMAL LOW (ref 13.0–17.0)

## 2021-10-19 MED ORDER — SODIUM CHLORIDE 0.9% IV SOLUTION: Freq: Once | INTRAVENOUS | Status: AC

## 2021-10-19 MED ORDER — ACETAMINOPHEN 325 MG PO TABS
650.0000 mg | ORAL_TABLET | Freq: Once | ORAL | Status: AC
  Administered 2021-10-19: 12:00:00 650 mg via ORAL
  Filled 2021-10-19: qty 2

## 2021-10-19 MED ORDER — DIPHENHYDRAMINE HCL 50 MG/ML IJ SOLN
25.0000 mg | Freq: Once | INTRAMUSCULAR | Status: AC
  Administered 2021-10-19: 12:00:00 25 mg via INTRAVENOUS
  Filled 2021-10-19: qty 1

## 2021-10-19 NOTE — Progress Notes (Signed)
Subjective: Shawn Adams is a 46 year old male with a medical history significant for sickle cell disease, sarcoidosis, chronic kidney disease stage III, chronic pain syndrome, opiate dependence and tolerance was admitted for sickle cell pain crisis.  Today, patient's hemoglobin is 6.6 g/dL.  He is status post 2 units PRBCs.  Patient continues to complain of low back and lower extremity pain today.  His pain intensity is 7/10.  He states that he cannot function at home on today and pain intensity goal is 5/10.  He denies headache, chest pain, shortness of breath, urinary symptoms, nausea, vomiting, or diarrhea.  Objective:  Vital signs in last 24 hours:  Vitals:   10/19/21 0600 10/19/21 0830 10/19/21 0906 10/19/21 0908  BP: 129/75   126/73  Pulse: 96   87  Resp: 15 14  18   Temp: 99.1 F (37.3 C)   98 F (36.7 C)  TempSrc: Oral     SpO2: 95% 95% 96% 96%  Weight:      Height:        Intake/Output from previous day:   Intake/Output Summary (Last 24 hours) at 10/19/2021 0914 Last data filed at 10/19/2021 0800 Gross per 24 hour  Intake 660 ml  Output 2550 ml  Net -1890 ml    Physical Exam: General: Alert, awake, oriented x3, in no acute distress.  HEENT: [place]/AT PEERL, EOMI Neck: Trachea midline,  no masses, no thyromegal,y no JVD, no carotid bruit OROPHARYNX:  Moist, No exudate/ erythema/lesions.  Heart: Regular rate and rhythm, without murmurs, rubs, gallops, PMI non-displaced, no heaves or thrills on palpation.  Lungs: Clear to auscultation, no wheezing or rhonchi noted. No increased vocal fremitus resonant to percussion  Abdomen: Soft, nontender, nondistended, positive bowel sounds, no masses no hepatosplenomegaly noted..  Neuro: No focal neurological deficits noted cranial nerves II through XII grossly intact. DTRs 2+ bilaterally upper and lower extremities. Strength 5 out of 5 in bilateral upper and lower extremities. Musculoskeletal: No warm swelling or erythema around  joints, no spinal tenderness noted. Psychiatric: Patient alert and oriented x3, good insight and cognition, good recent to remote recall. Lymph node survey: No cervical axillary or inguinal lymphadenopathy noted.  Lab Results:  Basic Metabolic Panel:    Component Value Date/Time   NA 137 10/18/2021 0405   NA 139 12/16/2020 1434   K 3.7 10/18/2021 0405   CL 113 (H) 10/18/2021 0405   CO2 17 (L) 10/18/2021 0405   BUN 29 (H) 10/18/2021 0405   BUN 31 (H) 12/16/2020 1434   CREATININE 2.05 (H) 10/18/2021 0405   CREATININE 1.39 (H) 05/11/2016 1100   GLUCOSE 93 10/18/2021 0405   CALCIUM 8.8 (L) 10/18/2021 0405   CBC:    Component Value Date/Time   WBC 5.5 10/19/2021 0715   HGB 6.6 (LL) 10/19/2021 0715   HGB 8.5 (L) 12/16/2020 1434   HCT 17.7 (L) 10/19/2021 0715   HCT 23.3 (L) 12/16/2020 1434   PLT 302 10/19/2021 0715   PLT 371 12/16/2020 1434   MCV 97.8 10/19/2021 0715   MCV 118 (H) 12/16/2020 1434   NEUTROABS 2.9 10/18/2021 0405   NEUTROABS 2.8 12/16/2020 1434   LYMPHSABS 1.4 10/18/2021 0405   LYMPHSABS 0.9 12/16/2020 1434   MONOABS 0.5 10/18/2021 0405   EOSABS 0.2 10/18/2021 0405   EOSABS 0.0 12/16/2020 1434   BASOSABS 0.0 10/18/2021 0405   BASOSABS 0.0 12/16/2020 1434    Recent Results (from the past 240 hour(s))  Resp Panel by RT-PCR (Flu A&B, Covid) Nasopharyngeal Swab  Status: None   Collection Time: 10/16/21  3:17 AM   Specimen: Nasopharyngeal Swab; Nasopharyngeal(NP) swabs in vial transport medium  Result Value Ref Range Status   SARS Coronavirus 2 by RT PCR NEGATIVE NEGATIVE Final    Comment: (NOTE) SARS-CoV-2 target nucleic acids are NOT DETECTED.  The SARS-CoV-2 RNA is generally detectable in upper respiratory specimens during the acute phase of infection. The lowest concentration of SARS-CoV-2 viral copies this assay can detect is 138 copies/mL. A negative result does not preclude SARS-Cov-2 infection and should not be used as the sole basis for  treatment or other patient management decisions. A negative result may occur with  improper specimen collection/handling, submission of specimen other than nasopharyngeal swab, presence of viral mutation(s) within the areas targeted by this assay, and inadequate number of viral copies(<138 copies/mL). A negative result must be combined with clinical observations, patient history, and epidemiological information. The expected result is Negative.  Fact Sheet for Patients:  EntrepreneurPulse.com.au  Fact Sheet for Healthcare Providers:  IncredibleEmployment.be  This test is no t yet approved or cleared by the [place] FDA and  has been authorized for detection and/or diagnosis of SARS-CoV-2 by FDA under an Emergency Use Authorization (EUA). This EUA will remain  in effect (meaning this test can be used) for the duration of the COVID-19 declaration under Section 564(b)(1) of the Act, 21 U.S.C.section 360bbb-3(b)(1), unless the authorization is terminated  or revoked sooner.       Influenza A by PCR NEGATIVE NEGATIVE Final   Influenza B by PCR NEGATIVE NEGATIVE Final    Comment: (NOTE) The Xpert Xpress SARS-CoV-2/FLU/RSV plus assay is intended as an aid in the diagnosis of influenza from Nasopharyngeal swab specimens and should not be used as a sole basis for treatment. Nasal washings and aspirates are unacceptable for Xpert Xpress SARS-CoV-2/FLU/RSV testing.  Fact Sheet for Patients: EntrepreneurPulse.com.au  Fact Sheet for Healthcare Providers: IncredibleEmployment.be  This test is not yet approved or cleared by the [place] FDA and has been authorized for detection and/or diagnosis of SARS-CoV-2 by FDA under an Emergency Use Authorization (EUA). This EUA will remain in effect (meaning this test can be used) for the duration of the COVID-19 declaration under Section 564(b)(1) of the Act, 21  U.S.C. section 360bbb-3(b)(1), unless the authorization is terminated or revoked.  Performed at [facility], [place] [address] [place], [place] [address]     Studies/Results: No results found.  Medications: Scheduled Meds:  enoxaparin (LOVENOX) injection  40 mg Subcutaneous Daily   folic acid  1 mg Oral Daily   HYDROmorphone   Intravenous Q4H   hydroxyurea  1,000 mg Oral Daily   melatonin  10 mg Oral QHS   mometasone-formoterol  2 puff Inhalation BID   senna-docusate  1 tablet Oral BID   sodium bicarbonate  650 mg Oral Daily   umeclidinium bromide  1 puff Inhalation Daily   cyanocobalamin  100 mcg Oral Daily   voxelotor  1,500 mg Oral QHS   Continuous Infusions: PRN Meds:.diphenhydrAMINE, famotidine, labetalol, naloxone **AND** sodium chloride flush, ondansetron (ZOFRAN) IV, oxyCODONE, polyethylene glycol  Consultants: None  Procedures: None  Antibiotics: None  Assessment/Plan: Principal Problem:   Sickle cell pain crisis (HCC) Active Problems:   Symptomatic anemia   Acute renal failure superimposed on chronic kidney disease (HCC)   CKD (chronic kidney disease) stage 2, GFR 60-89 ml/min   History of aneurysm   Anemia of chronic disease   Persistent asthma without complication  Sickle cell disease with pain crisis: Continue IV Dilaudid PCA, no changes in settings IV Toradol contraindicated due to history of CKD Oxycodone 20 mg every 4 hours as needed for severe breakthrough pain Continue to monitor vital signs very closely and reevaluate pain scale regularly.  Acute on chronic kidney disease stage III: Stable.  Continue to avoid nephrotoxins.  Trend creatinine.  Labs in AM.  Anemia of chronic disease: Today, patient's hemoglobin is 6.6 g/dL.  Transfuse 1 unit PRBCs.  Follow labs in AM.  Chronic pain syndrome: Continue home medications  History of sarcoidosis: Patient remains clinically stable at this time.  He will follow-up with  pulmonology as an outpatient.  Elevated blood pressure: Level.  Continue home medications.  Moderate persistent asthma: Continue home medications Clinically stable   Code Status: Full Code Family Communication: N/A Disposition Plan: Not yet ready for discharge  [place], MSN, FNP-C Patient [place] [address] [place], [place] [address] [phone]  If 7PM-7AM, please contact night-coverage.  10/19/2021, 9:14 AM  LOS: 3 days

## 2021-10-20 DIAGNOSIS — D5700 Hb-SS disease with crisis, unspecified: Secondary | ICD-10-CM | POA: Diagnosis not present

## 2021-10-20 LAB — TYPE AND SCREEN
ABO/RH(D): B POS
Antibody Screen: NEGATIVE
Donor AG Type: NEGATIVE
Donor AG Type: NEGATIVE
Unit division: 0
Unit division: 0

## 2021-10-20 LAB — CBC
HCT: 21.6 % — ABNORMAL LOW (ref 39.0–52.0)
Hemoglobin: 7.7 g/dL — ABNORMAL LOW (ref 13.0–17.0)
MCH: 34.5 pg — ABNORMAL HIGH (ref 26.0–34.0)
MCHC: 35.6 g/dL (ref 30.0–36.0)
MCV: 96.9 fL (ref 80.0–100.0)
Platelets: 308 10*3/uL (ref 150–400)
RBC: 2.23 MIL/uL — ABNORMAL LOW (ref 4.22–5.81)
RDW: 20.4 % — ABNORMAL HIGH (ref 11.5–15.5)
WBC: 5.1 10*3/uL (ref 4.0–10.5)
nRBC: 6.9 % — ABNORMAL HIGH (ref 0.0–0.2)

## 2021-10-20 LAB — BASIC METABOLIC PANEL
Anion gap: 7 (ref 5–15)
BUN: 27 mg/dL — ABNORMAL HIGH (ref 6–20)
CO2: 20 mmol/L — ABNORMAL LOW (ref 22–32)
Calcium: 8.9 mg/dL (ref 8.9–10.3)
Chloride: 110 mmol/L (ref 98–111)
Creatinine, Ser: 2.16 mg/dL — ABNORMAL HIGH (ref 0.61–1.24)
GFR, Estimated: 38 mL/min — ABNORMAL LOW (ref >60)
Glucose, Bld: 93 mg/dL (ref 70–99)
Potassium: 4.1 mmol/L (ref 3.5–5.1)
Sodium: 137 mmol/L (ref 135–145)

## 2021-10-20 LAB — BPAM RBC
Blood Product Expiration Date: 202302242359
Blood Product Expiration Date: 202302252359
ISSUE DATE / TIME: 202301210756
ISSUE DATE / TIME: 202301241143
Unit Type and Rh: 5100
Unit Type and Rh: 5100

## 2021-10-20 MED ORDER — SODIUM CHLORIDE 0.45 % IV SOLN: INTRAVENOUS | Status: DC

## 2021-10-20 NOTE — Progress Notes (Signed)
Subjective: Shawn Adams is a 46 year old male with a medical history significant for sickle cell disease, sarcoidosis, chronic kidney disease stage III, chronic pain syndrome, opiate dependence and tolerance was admitted for sickle cell pain crisis.  Today, patient's hemoglobin is 7.7 g/dL, which is consistent with his baseline.  He is status post 3 units PRBCs.  Patient continues to complain of low back and lower extremity pain today.  Patient states that he had a difficult night as far as pain control.  His pain intensity is 7/10.  He states that he cannot function at home on today and pain intensity goal is 5/10.  He denies headache, chest pain, shortness of breath, urinary symptoms, nausea, vomiting, or diarrhea.  Objective:  Vital signs in last 24 hours:  Vitals:   10/20/21 0025 10/20/21 0356 10/20/21 0616 10/20/21 0744  BP: 127/71  128/69   Pulse: 76  73   Resp: 18 11 14    Temp: 98.8 F (37.1 C)  98.7 F (37.1 C)   TempSrc: Oral  Oral   SpO2: 98% 96% 97% 94%  Weight:      Height:        Intake/Output from previous day:   Intake/Output Summary (Last 24 hours) at 10/20/2021 1036 Last data filed at 10/20/2021 1015 Gross per 24 hour  Intake 1926 ml  Output 3800 ml  Net -1874 ml    Physical Exam: General: Alert, awake, oriented x3, in no acute distress.  HEENT: [place]/AT PEERL, EOMI Neck: Trachea midline,  no masses, no thyromegal,y no JVD, no carotid bruit OROPHARYNX:  Moist, No exudate/ erythema/lesions.  Heart: Regular rate and rhythm, without murmurs, rubs, gallops, PMI non-displaced, no heaves or thrills on palpation.  Lungs: Clear to auscultation, no wheezing or rhonchi noted. No increased vocal fremitus resonant to percussion  Abdomen: Soft, nontender, nondistended, positive bowel sounds, no masses no hepatosplenomegaly noted..  Neuro: No focal neurological deficits noted cranial nerves II through XII grossly intact. DTRs 2+ bilaterally upper and lower extremities.  Strength 5 out of 5 in bilateral upper and lower extremities. Musculoskeletal: No warm swelling or erythema around joints, no spinal tenderness noted. Psychiatric: Patient alert and oriented x3, good insight and cognition, good recent to remote recall. Lymph node survey: No cervical axillary or inguinal lymphadenopathy noted.  Lab Results:  Basic Metabolic Panel:    Component Value Date/Time   NA 137 10/20/2021 0743   NA 139 12/16/2020 1434   K 4.1 10/20/2021 0743   CL 110 10/20/2021 0743   CO2 20 (L) 10/20/2021 0743   BUN 27 (H) 10/20/2021 0743   BUN 31 (H) 12/16/2020 1434   CREATININE 2.16 (H) 10/20/2021 0743   CREATININE 1.39 (H) 05/11/2016 1100   GLUCOSE 93 10/20/2021 0743   CALCIUM 8.9 10/20/2021 0743   CBC:    Component Value Date/Time   WBC 5.1 10/20/2021 0743   HGB 7.7 (L) 10/20/2021 0743   HGB 8.5 (L) 12/16/2020 1434   HCT 21.6 (L) 10/20/2021 0743   HCT 23.3 (L) 12/16/2020 1434   PLT 308 10/20/2021 0743   PLT 371 12/16/2020 1434   MCV 96.9 10/20/2021 0743   MCV 118 (H) 12/16/2020 1434   NEUTROABS 2.9 10/18/2021 0405   NEUTROABS 2.8 12/16/2020 1434   LYMPHSABS 1.4 10/18/2021 0405   LYMPHSABS 0.9 12/16/2020 1434   MONOABS 0.5 10/18/2021 0405   EOSABS 0.2 10/18/2021 0405   EOSABS 0.0 12/16/2020 1434   BASOSABS 0.0 10/18/2021 0405   BASOSABS 0.0 12/16/2020 1434  Recent Results (from the past 240 hour(s))  Resp Panel by RT-PCR (Flu A&B, Covid) Nasopharyngeal Swab     Status: None   Collection Time: 10/16/21  3:17 AM   Specimen: Nasopharyngeal Swab; Nasopharyngeal(NP) swabs in vial transport medium  Result Value Ref Range Status   SARS Coronavirus 2 by RT PCR NEGATIVE NEGATIVE Final    Comment: (NOTE) SARS-CoV-2 target nucleic acids are NOT DETECTED.  The SARS-CoV-2 RNA is generally detectable in upper respiratory specimens during the acute phase of infection. The lowest concentration of SARS-CoV-2 viral copies this assay can detect is 138 copies/mL. A  negative result does not preclude SARS-Cov-2 infection and should not be used as the sole basis for treatment or other patient management decisions. A negative result may occur with  improper specimen collection/handling, submission of specimen other than nasopharyngeal swab, presence of viral mutation(s) within the areas targeted by this assay, and inadequate number of viral copies(<138 copies/mL). A negative result must be combined with clinical observations, patient history, and epidemiological information. The expected result is Negative.  Fact Sheet for Patients:  BloggerCourse.com  Fact Sheet for Healthcare Providers:  SeriousBroker.it  This test is no t yet approved or cleared by the [place] FDA and  has been authorized for detection and/or diagnosis of SARS-CoV-2 by FDA under an Emergency Use Authorization (EUA). This EUA will remain  in effect (meaning this test can be used) for the duration of the COVID-19 declaration under Section 564(b)(1) of the Act, 21 U.S.C.section 360bbb-3(b)(1), unless the authorization is terminated  or revoked sooner.       Influenza A by PCR NEGATIVE NEGATIVE Final   Influenza B by PCR NEGATIVE NEGATIVE Final    Comment: (NOTE) The Xpert Xpress SARS-CoV-2/FLU/RSV plus assay is intended as an aid in the diagnosis of influenza from Nasopharyngeal swab specimens and should not be used as a sole basis for treatment. Nasal washings and aspirates are unacceptable for Xpert Xpress SARS-CoV-2/FLU/RSV testing.  Fact Sheet for Patients: BloggerCourse.com  Fact Sheet for Healthcare Providers: SeriousBroker.it  This test is not yet approved or cleared by the [place] FDA and has been authorized for detection and/or diagnosis of SARS-CoV-2 by FDA under an Emergency Use Authorization (EUA). This EUA will remain in effect (meaning this test can  be used) for the duration of the COVID-19 declaration under Section 564(b)(1) of the Act, 21 U.S.C. section 360bbb-3(b)(1), unless the authorization is terminated or revoked.  Performed at [facility], [address], [place], [place] [address]     Studies/Results: No results found.  Medications: Scheduled Meds:  enoxaparin (LOVENOX) injection  40 mg Subcutaneous Daily   folic acid  1 mg Oral Daily   HYDROmorphone   Intravenous Q4H   hydroxyurea  1,000 mg Oral Daily   melatonin  10 mg Oral QHS   mometasone-formoterol  2 puff Inhalation BID   senna-docusate  1 tablet Oral BID   sodium bicarbonate  650 mg Oral Daily   umeclidinium bromide  1 puff Inhalation Daily   cyanocobalamin  100 mcg Oral Daily   voxelotor  1,500 mg Oral QHS   Continuous Infusions:  sodium chloride     PRN Meds:.diphenhydrAMINE, famotidine, labetalol, naloxone **AND** sodium chloride flush, ondansetron (ZOFRAN) IV, oxyCODONE, polyethylene glycol  Consultants: None  Procedures: None  Antibiotics: None  Assessment/Plan: Principal Problem:   Sickle cell pain crisis (HCC) Active Problems:   Symptomatic anemia   Acute renal failure superimposed on chronic kidney disease (HCC)  CKD (chronic kidney disease) stage 2, GFR 60-89 ml/min   History of aneurysm   Anemia of chronic disease   Persistent asthma without complication  Sickle cell disease with pain crisis: Continue IV Dilaudid PCA, no changes in settings IV Toradol contraindicated due to history of CKD Oxycodone 20 mg every 4 hours as needed for severe breakthrough pain Continue to monitor vital signs very closely and reevaluate pain scale regularly.  Acute on chronic kidney disease stage III: Stable.  Continue to avoid nephrotoxins.  Trend creatinine.  Labs in AM.  Restart gentle hydration   Anemia of chronic disease: Hemoglobin is improved to 7.7 g/dL.  Patient is status post 3 units PRBCs.    Chronic pain  syndrome: Continue home medications  History of sarcoidosis: Patient remains clinically stable at this time.  He will follow-up with pulmonology as an outpatient.  Elevated blood pressure: Level.  Continue home medications.  Moderate persistent asthma: Continue home medications Clinically stable   Code Status: Full Code Family Communication: N/A Disposition Plan: Not yet ready for discharge  [place], MSN, FNP-C Patient [place] [address] [place], [place] [address] [phone]  If 5PM-8AM, please contact night-coverage.  10/20/2021, 10:36 AM  LOS: 4 days

## 2021-10-21 DIAGNOSIS — D5700 Hb-SS disease with crisis, unspecified: Secondary | ICD-10-CM | POA: Diagnosis not present

## 2021-10-21 LAB — CBC
HCT: 21.6 % — ABNORMAL LOW (ref 39.0–52.0)
Hemoglobin: 7.8 g/dL — ABNORMAL LOW (ref 13.0–17.0)
MCH: 34.5 pg — ABNORMAL HIGH (ref 26.0–34.0)
MCHC: 36.1 g/dL — ABNORMAL HIGH (ref 30.0–36.0)
MCV: 95.6 fL (ref 80.0–100.0)
Platelets: 290 10*3/uL (ref 150–400)
RBC: 2.26 MIL/uL — ABNORMAL LOW (ref 4.22–5.81)
RDW: 20.4 % — ABNORMAL HIGH (ref 11.5–15.5)
WBC: 5.9 10*3/uL (ref 4.0–10.5)
nRBC: 3.2 % — ABNORMAL HIGH (ref 0.0–0.2)

## 2021-10-21 LAB — COMPREHENSIVE METABOLIC PANEL
ALT: 29 U/L (ref 0–44)
AST: 47 U/L — ABNORMAL HIGH (ref 15–41)
Albumin: 3.3 g/dL — ABNORMAL LOW (ref 3.5–5.0)
Alkaline Phosphatase: 103 U/L (ref 38–126)
Anion gap: 7 (ref 5–15)
BUN: 34 mg/dL — ABNORMAL HIGH (ref 6–20)
CO2: 19 mmol/L — ABNORMAL LOW (ref 22–32)
Calcium: 8.8 mg/dL — ABNORMAL LOW (ref 8.9–10.3)
Chloride: 110 mmol/L (ref 98–111)
Creatinine, Ser: 2.16 mg/dL — ABNORMAL HIGH (ref 0.61–1.24)
GFR, Estimated: 38 mL/min — ABNORMAL LOW (ref >60)
Glucose, Bld: 98 mg/dL (ref 70–99)
Potassium: 4.3 mmol/L (ref 3.5–5.1)
Sodium: 136 mmol/L (ref 135–145)
Total Bilirubin: 1.3 mg/dL — ABNORMAL HIGH (ref 0.3–1.2)
Total Protein: 6.9 g/dL (ref 6.5–8.1)

## 2021-10-21 LAB — LACTATE DEHYDROGENASE: LDH: 541 U/L — ABNORMAL HIGH (ref 98–192)

## 2021-10-21 MED ORDER — SODIUM CHLORIDE 0.9% FLUSH: 9.0000 mL | INTRAVENOUS | Status: DC | PRN

## 2021-10-21 MED ORDER — NALOXONE HCL 0.4 MG/ML IJ SOLN: 0.4000 mg | INTRAMUSCULAR | Status: DC | PRN

## 2021-10-21 MED ORDER — HYDROMORPHONE 1 MG/ML IV SOLN
INTRAVENOUS | Status: DC
  Administered 2021-10-21: 3 mg via INTRAVENOUS
  Administered 2021-10-21: 0.6 mg via INTRAVENOUS

## 2021-10-21 MED ORDER — ONDANSETRON HCL 4 MG/2ML IJ SOLN: 4.0000 mg | Freq: Four times a day (QID) | INTRAMUSCULAR | Status: DC | PRN

## 2021-10-21 NOTE — Progress Notes (Signed)
Subjective: Shawn Adams is a 46 year old male with a medical history significant for sickle cell disease, sarcoidosis, chronic kidney disease stage III, chronic pain syndrome, opiate dependence and tolerance was admitted for sickle cell pain crisis.  Patient continues to complain of low back and lower extremity pain today.  Patient states that he had a difficult night as far as pain control.  His pain intensity is 7/10.  He states that he cannot function at home on today and pain intensity goal is 5/10.  He denies headache, chest pain, shortness of breath, urinary symptoms, nausea, vomiting, or diarrhea.  Objective:  Vital signs in last 24 hours:  Vitals:   10/21/21 0357 10/21/21 0526 10/21/21 0831 10/21/21 0935  BP:  121/69  (!) 131/102  Pulse:  80  81  Resp: 13 16 13 18   Temp:  98 F (36.7 C)  98.1 F (36.7 C)  TempSrc:  Oral  Oral  SpO2: 92% 96% 97% 95%  Weight:      Height:        Intake/Output from previous day:   Intake/Output Summary (Last 24 hours) at 10/21/2021 1128 Last data filed at 10/21/2021 1003 Gross per 24 hour  Intake 1744.89 ml  Output 1650 ml  Net 94.89 ml    Physical Exam: General: Alert, awake, oriented x3, in no acute distress.  HEENT: [place]/AT PEERL, EOMI Neck: Trachea midline,  no masses, no thyromegal,y no JVD, no carotid bruit OROPHARYNX:  Moist, No exudate/ erythema/lesions.  Heart: Regular rate and rhythm, without murmurs, rubs, gallops, PMI non-displaced, no heaves or thrills on palpation.  Lungs: Clear to auscultation, no wheezing or rhonchi noted. No increased vocal fremitus resonant to percussion  Abdomen: Soft, nontender, nondistended, positive bowel sounds, no masses no hepatosplenomegaly noted..  Neuro: No focal neurological deficits noted cranial nerves II through XII grossly intact. DTRs 2+ bilaterally upper and lower extremities. Strength 5 out of 5 in bilateral upper and lower extremities. Musculoskeletal: No warm swelling or erythema  around joints, no spinal tenderness noted. Psychiatric: Patient alert and oriented x3, good insight and cognition, good recent to remote recall. Lymph node survey: No cervical axillary or inguinal lymphadenopathy noted.  Lab Results:  Basic Metabolic Panel:    Component Value Date/Time   NA 137 10/20/2021 0743   NA 139 12/16/2020 1434   K 4.1 10/20/2021 0743   CL 110 10/20/2021 0743   CO2 20 (L) 10/20/2021 0743   BUN 27 (H) 10/20/2021 0743   BUN 31 (H) 12/16/2020 1434   CREATININE 2.16 (H) 10/20/2021 0743   CREATININE 1.39 (H) 05/11/2016 1100   GLUCOSE 93 10/20/2021 0743   CALCIUM 8.9 10/20/2021 0743   CBC:    Component Value Date/Time   WBC 5.1 10/20/2021 0743   HGB 7.7 (L) 10/20/2021 0743   HGB 8.5 (L) 12/16/2020 1434   HCT 21.6 (L) 10/20/2021 0743   HCT 23.3 (L) 12/16/2020 1434   PLT 308 10/20/2021 0743   PLT 371 12/16/2020 1434   MCV 96.9 10/20/2021 0743   MCV 118 (H) 12/16/2020 1434   NEUTROABS 2.9 10/18/2021 0405   NEUTROABS 2.8 12/16/2020 1434   LYMPHSABS 1.4 10/18/2021 0405   LYMPHSABS 0.9 12/16/2020 1434   MONOABS 0.5 10/18/2021 0405   EOSABS 0.2 10/18/2021 0405   EOSABS 0.0 12/16/2020 1434   BASOSABS 0.0 10/18/2021 0405   BASOSABS 0.0 12/16/2020 1434    Recent Results (from the past 240 hour(s))  Resp Panel by RT-PCR (Flu A&B, Covid) Nasopharyngeal Swab  Status: None   Collection Time: 10/16/21  3:17 AM   Specimen: Nasopharyngeal Swab; Nasopharyngeal(NP) swabs in vial transport medium  Result Value Ref Range Status   SARS Coronavirus 2 by RT PCR NEGATIVE NEGATIVE Final    Comment: (NOTE) SARS-CoV-2 target nucleic acids are NOT DETECTED.  The SARS-CoV-2 RNA is generally detectable in upper respiratory specimens during the acute phase of infection. The lowest concentration of SARS-CoV-2 viral copies this assay can detect is 138 copies/mL. A negative result does not preclude SARS-Cov-2 infection and should not be used as the sole basis for treatment  or other patient management decisions. A negative result may occur with  improper specimen collection/handling, submission of specimen other than nasopharyngeal swab, presence of viral mutation(s) within the areas targeted by this assay, and inadequate number of viral copies(<138 copies/mL). A negative result must be combined with clinical observations, patient history, and epidemiological information. The expected result is Negative.  Fact Sheet for Patients:  EntrepreneurPulse.com.au  Fact Sheet for Healthcare Providers:  IncredibleEmployment.be  This test is no t yet approved or cleared by the [place] FDA and  has been authorized for detection and/or diagnosis of SARS-CoV-2 by FDA under an Emergency Use Authorization (EUA). This EUA will remain  in effect (meaning this test can be used) for the duration of the COVID-19 declaration under Section 564(b)(1) of the Act, 21 U.S.C.section 360bbb-3(b)(1), unless the authorization is terminated  or revoked sooner.       Influenza A by PCR NEGATIVE NEGATIVE Final   Influenza B by PCR NEGATIVE NEGATIVE Final    Comment: (NOTE) The Xpert Xpress SARS-CoV-2/FLU/RSV plus assay is intended as an aid in the diagnosis of influenza from Nasopharyngeal swab specimens and should not be used as a sole basis for treatment. Nasal washings and aspirates are unacceptable for Xpert Xpress SARS-CoV-2/FLU/RSV testing.  Fact Sheet for Patients: EntrepreneurPulse.com.au  Fact Sheet for Healthcare Providers: IncredibleEmployment.be  This test is not yet approved or cleared by the [place] FDA and has been authorized for detection and/or diagnosis of SARS-CoV-2 by FDA under an Emergency Use Authorization (EUA). This EUA will remain in effect (meaning this test can be used) for the duration of the COVID-19 declaration under Section 564(b)(1) of the Act, 21 U.S.C. section  360bbb-3(b)(1), unless the authorization is terminated or revoked.  Performed at [facility], [place] [address], [place], [place] [address]     Studies/Results: No results found.  Medications: Scheduled Meds:  enoxaparin (LOVENOX) injection  40 mg Subcutaneous Daily   folic acid  1 mg Oral Daily   HYDROmorphone   Intravenous Q4H   hydroxyurea  1,000 mg Oral Daily   melatonin  10 mg Oral QHS   mometasone-formoterol  2 puff Inhalation BID   senna-docusate  1 tablet Oral BID   sodium bicarbonate  650 mg Oral Daily   umeclidinium bromide  1 puff Inhalation Daily   cyanocobalamin  100 mcg Oral Daily   voxelotor  1,500 mg Oral QHS   Continuous Infusions:  sodium chloride 50 mL/hr at 10/21/21 0833   PRN Meds:.diphenhydrAMINE, famotidine, labetalol, naloxone **AND** sodium chloride flush, ondansetron (ZOFRAN) IV, oxyCODONE, polyethylene glycol  Consultants: None  Procedures: None  Antibiotics: None  Assessment/Plan: Principal Problem:   Sickle cell pain crisis (HCC) Active Problems:   Symptomatic anemia   Acute renal failure superimposed on chronic kidney disease (HCC)   CKD (chronic kidney disease) stage 2, GFR 60-89 ml/min   History of aneurysm   Anemia  of chronic disease   Persistent asthma without complication  Sickle cell disease with pain crisis: Continue IV Dilaudid PCA, no changes in settings IV Toradol contraindicated due to history of CKD Oxycodone 20 mg every 4 hours as needed for severe breakthrough pain Continue to monitor vital signs very closely and reevaluate pain scale regularly.  Acute on chronic kidney disease stage III: Stable.  Continue to avoid nephrotoxins.  Trend creatinine.  Labs in AM.  Restart gentle hydration   Anemia of chronic disease: Hemoglobin is stable.  Patient is status post 3 units PRBCs.  No further blood transfusions warranted at this time.  We will follow labs in AM.  Chronic pain syndrome: Continue home  medications  History of sarcoidosis: Patient remains clinically stable at this time.  He will follow-up with pulmonology as an outpatient.  Elevated blood pressure: Level.  Continue home medications.  Moderate persistent asthma: Continue home medications Clinically stable   Code Status: Full Code Family Communication: N/A Disposition Plan: Not yet ready for discharge  [place], MSN, FNP-C Patient [place] [address] [place], [place] [address] [phone]  If 5PM-8AM, please contact night-coverage.  10/21/2021, 11:28 AM  LOS: 5 days

## 2021-10-22 DIAGNOSIS — D5700 Hb-SS disease with crisis, unspecified: Secondary | ICD-10-CM | POA: Diagnosis not present

## 2021-10-22 LAB — CBC
HCT: 20.7 % — ABNORMAL LOW (ref 39.0–52.0)
Hemoglobin: 7.4 g/dL — ABNORMAL LOW (ref 13.0–17.0)
MCH: 34.3 pg — ABNORMAL HIGH (ref 26.0–34.0)
MCHC: 35.7 g/dL (ref 30.0–36.0)
MCV: 95.8 fL (ref 80.0–100.0)
Platelets: 274 10*3/uL (ref 150–400)
RBC: 2.16 MIL/uL — ABNORMAL LOW (ref 4.22–5.81)
RDW: 19.7 % — ABNORMAL HIGH (ref 11.5–15.5)
WBC: 5.8 10*3/uL (ref 4.0–10.5)
nRBC: 2.6 % — ABNORMAL HIGH (ref 0.0–0.2)

## 2021-10-22 LAB — BASIC METABOLIC PANEL
Anion gap: 8 (ref 5–15)
BUN: 40 mg/dL — ABNORMAL HIGH (ref 6–20)
CO2: 19 mmol/L — ABNORMAL LOW (ref 22–32)
Calcium: 9.1 mg/dL (ref 8.9–10.3)
Chloride: 105 mmol/L (ref 98–111)
Creatinine, Ser: 2.27 mg/dL — ABNORMAL HIGH (ref 0.61–1.24)
GFR, Estimated: 35 mL/min — ABNORMAL LOW (ref >60)
Glucose, Bld: 107 mg/dL — ABNORMAL HIGH (ref 70–99)
Potassium: 4.2 mmol/L (ref 3.5–5.1)
Sodium: 132 mmol/L — ABNORMAL LOW (ref 135–145)

## 2021-10-22 NOTE — Plan of Care (Signed)
PIV removed. DC paperwork reviewed with pt. Pt gathered belongings. Pt ambulated to lobby.   Problem: Education: Goal: Knowledge of General Education information will improve Description: Including pain rating scale, medication(s)/side effects and non-pharmacologic comfort measures Outcome: Completed/Met   Problem: Health Behavior/Discharge Planning: Goal: Ability to manage health-related needs will improve Outcome: Completed/Met   Problem: Clinical Measurements: Goal: Ability to maintain clinical measurements within normal limits will improve Outcome: Completed/Met Goal: Will remain free from infection Outcome: Completed/Met Goal: Diagnostic test results will improve Outcome: Completed/Met Goal: Respiratory complications will improve Outcome: Completed/Met Goal: Cardiovascular complication will be avoided Outcome: Completed/Met   Problem: Activity: Goal: Risk for activity intolerance will decrease Outcome: Completed/Met   Problem: Nutrition: Goal: Adequate nutrition will be maintained Outcome: Completed/Met   Problem: Coping: Goal: Level of anxiety will decrease Outcome: Completed/Met   Problem: Elimination: Goal: Will not experience complications related to bowel motility Outcome: Completed/Met Goal: Will not experience complications related to urinary retention Outcome: Completed/Met   Problem: Pain Managment: Goal: General experience of comfort will improve Outcome: Completed/Met   Problem: Safety: Goal: Ability to remain free from injury will improve Outcome: Completed/Met   Problem: Skin Integrity: Goal: Risk for impaired skin integrity will decrease Outcome: Completed/Met   Problem: Education: Goal: Knowledge of vaso-occlusive preventative measures will improve Outcome: Completed/Met Goal: Awareness of infection prevention will improve Outcome: Completed/Met Goal: Awareness of signs and symptoms of anemia will improve Outcome: Completed/Met Goal:  Long-term complications will improve Outcome: Completed/Met

## 2021-10-22 NOTE — Discharge Summary (Signed)
Physician Discharge Summary  Shawn Adams [id number] DOB: [birth year] DOA: 10/16/2021  PCP: Vevelyn Francois, NP  Admit date: 10/16/2021  Discharge date: 10/22/2021  Discharge Diagnoses:  Principal Problem:   Sickle cell pain crisis [facility]) Active Problems:   Symptomatic anemia   Acute renal failure superimposed on chronic kidney disease ([place])   CKD (chronic kidney disease) stage 2, GFR 60-89 ml/min   History of aneurysm   Anemia of chronic disease   Persistent asthma without complication   Discharge Condition: Stable  Disposition:   Follow-up Information     Vevelyn Francois, NP Follow up in 1 week(s).   Specialty: Adult Health Nurse Practitioner Contact information: [address] [place] [address] [phone]                Pt is discharged home in good condition and is to follow up with Vevelyn Francois, NP this week to have labs evaluated. Shawn Adams is instructed to increase activity slowly and balance with rest for the next few days, and use prescribed medication to complete treatment of pain  Diet: Regular Wt Readings from Last 3 Encounters:  10/16/21 61.2 kg  07/09/21 61.1 kg  03/18/21 57.2 kg    History of present illness:  Shawn Adams is a 46 year old male with a medical history significant for sickle cell anemia, sarcoidosis, aneurysm of the brain, anemia of chronic disease, chronic pain, intolerance presented to the ER because of worsening pain which has been ongoing for the last few weeks.  Pain is mostly in low back and lower extremities.  Patient denies any fever, chills, chest pain, or shortness of breath. ER course: In the ER, patient is afebrile and not hypoxic.  Appears nonfocal.  Hemoglobin is around 6.2, which is minimally is decreased from usual which is around 8.0 g/dL.  Creatinine 2.7, which is increased from 1.6 recently.  COVID-19 negative.  Patient admitted for further management of sickle cell pain crisis. Hospital Course:   Sickle cell disease with pain crisis: Patient admitted to [place].  IV Dilaudid PCA initiated along with other sickle cell management protocols.  Also, oxycodone 20 mg every 4 hours was continued throughout admission.  Today, patient's pain intensity has decreased to 4/10 and he feels that he can manage at home on current medication regimen.  Patient is followed by [facility] hematology who prescribes his home pain medications and disease modifying agents.  Patient has an appointment scheduled with his hematology team on October 25, 2021.  At that time, advised to repeat CBC with differential and CMP.  Acute kidney injury superimposed on chronic kidney disease stage III: On admission, creatinine elevated to 2.7, which is above patient's noted baseline.  Prior to discharge, creatinine has decreased to 2.1.  Patient advised to follow-up with [facility] nephrology as scheduled. Patient will also follow-up with his PCP, NP Shawn Adams.    Patient has a long history of sarcoidosis: He is managed by [facility] pulmonology and is followed consistently for both his moderate persistent asthma and sarcoidosis.  According to patient's records, he has been following up consistently.  He has not had any asthma exacerbations over the past several months.    Patient is alert, oriented, and ambulating without assistance.  He is aware of all upcoming appointments.    Patient was therefore discharged home today in a hemodynamically stable condition.   Shawn Adams will follow-up with PCP within 1 week of this discharge. Shawn Adams was counseled extensively about nonpharmacologic  means of pain management, patient verbalized understanding and was appreciative of  the care received during this admission.   We discussed the need for good hydration, monitoring of hydration status, avoidance of heat, cold, stress, and infection triggers. We discussed the need to be adherent with taking Oxbryta and other home medications. Patient  was reminded of the need to seek medical attention immediately if any symptom of bleeding, anemia, or infection occurs.  Discharge Exam: Vitals:   10/22/21 1009 10/22/21 1236  BP: 118/71   Pulse: 79   Resp: 15 16  Temp: 98 F (36.7 C)   SpO2: 96% 93%   Vitals:   10/22/21 0743 10/22/21 0853 10/22/21 1009 10/22/21 1236  BP:   118/71   Pulse:   79   Resp:  16 15 16   Temp:   98 F (36.7 C)   TempSrc:   Oral   SpO2: 95% 96% 96% 93%  Weight:      Height:        General appearance : Awake, alert, not in any distress. Speech Clear. Not toxic looking HEENT: Atraumatic and Normocephalic, pupils equally reactive to light and accomodation. Scleral icterus Neck: Supple, no JVD. No cervical lymphadenopathy.  Chest: Good air entry bilaterally, no added sounds  CVS: S1 S2 regular, no murmurs.  Abdomen: Bowel sounds present, Non tender and not distended with no guarding, rigidity or rebound. Extremities: B/L Lower Ext shows no edema, both legs are warm to touch Neurology: Awake alert, and oriented X 3, CN II-XII intact, Non focal Skin: No Rash. Very dry  Discharge Instructions  Discharge Instructions     Discharge patient   Complete by: As directed    Discharge disposition: 01-Home or Self Care   Discharge patient date: 10/22/2021      Allergies as of 10/22/2021   No Known Allergies      Medication List     TAKE these medications    acetaminophen 500 MG tablet Commonly known as: TYLENOL Take 500 mg by mouth every 6 (six) hours as needed for moderate pain or fever (pain).   albuterol 108 (90 Base) MCG/ACT inhaler Commonly known as: VENTOLIN HFA Inhale into the lungs.   cyanocobalamin 50 MCG tablet Take 100 mcg by mouth daily.   cyclobenzaprine 10 MG tablet Commonly known as: FLEXERIL Take 1 tablet (10 mg total) by mouth 3 (three) times daily as needed for muscle spasms.   docusate sodium 100 MG capsule Commonly known as: COLACE Take 100 mg by mouth daily as needed  for mild constipation.   ergocalciferol 1.25 MG (50000 UT) capsule Commonly known as: VITAMIN D2 Take 50,000 Units by mouth every 30 (thirty) days.   famotidine 10 MG tablet Commonly known as: PEPCID Take 10 mg by mouth daily as needed for heartburn or indigestion.   folic acid 1 MG tablet Commonly known as: FOLVITE Take 1 tablet (1 mg total) by mouth daily.   hydroxychloroquine 200 MG tablet Commonly known as: PLAQUENIL Take 200 mg by mouth daily.   hydroxyurea 500 MG capsule Commonly known as: HYDREA Take 2 capsules (1,000 mg total) by mouth daily. May take with food to minimize GI side effects.   Incruse Ellipta 62.5 MCG/ACT Aepb Generic drug: umeclidinium bromide Inhale 1 puff into the lungs daily as needed (for shortness of breath).   Melatonin 10 MG Tabs Take 10 mg by mouth at bedtime.   Oxbryta 500 MG Tabs tablet Generic drug: voxelotor Take 1,500 mg by mouth at bedtime.  Oxycodone HCl 20 MG Tabs Take 20 mg by mouth every 4 (four) hours as needed (pain).   sodium bicarbonate 650 MG tablet Take 650 mg by mouth daily.   Symbicort 160-4.5 MCG/ACT inhaler Generic drug: budesonide-formoterol Inhale 2 puffs into the lungs 2 (two) times daily.   Symbicort 80-4.5 MCG/ACT inhaler Generic drug: budesonide-formoterol Inhale 2 puffs into the lungs daily as needed (for shortness of breath).   vitamin C 1000 MG tablet Take 1,000 mg by mouth daily.        The results of significant diagnostics from this hospitalization (including imaging, microbiology, ancillary and laboratory) are listed below for reference.    Significant Diagnostic Studies: No results found.  Microbiology: Recent Results (from the past 240 hour(s))  Resp Panel by RT-PCR (Flu A&B, Covid) Nasopharyngeal Swab     Status: None   Collection Time: 10/16/21  3:17 AM   Specimen: Nasopharyngeal Swab; Nasopharyngeal(NP) swabs in vial transport medium  Result Value Ref Range Status   SARS Coronavirus  2 by RT PCR NEGATIVE NEGATIVE Final    Comment: (NOTE) SARS-CoV-2 target nucleic acids are NOT DETECTED.  The SARS-CoV-2 RNA is generally detectable in upper respiratory specimens during the acute phase of infection. The lowest concentration of SARS-CoV-2 viral copies this assay can detect is 138 copies/mL. A negative result does not preclude SARS-Cov-2 infection and should not be used as the sole basis for treatment or other patient management decisions. A negative result may occur with  improper specimen collection/handling, submission of specimen other than nasopharyngeal swab, presence of viral mutation(s) within the areas targeted by this assay, and inadequate number of viral copies(<138 copies/mL). A negative result must be combined with clinical observations, patient history, and epidemiological information. The expected result is Negative.  Fact Sheet for Patients:  EntrepreneurPulse.com.au  Fact Sheet for Healthcare Providers:  IncredibleEmployment.be  This test is no t yet approved or cleared by the [place] FDA and  has been authorized for detection and/or diagnosis of SARS-CoV-2 by FDA under an Emergency Use Authorization (EUA). This EUA will remain  in effect (meaning this test can be used) for the duration of the COVID-19 declaration under Section 564(b)(1) of the Act, 21 U.S.C.section 360bbb-3(b)(1), unless the authorization is terminated  or revoked sooner.       Influenza A by PCR NEGATIVE NEGATIVE Final   Influenza B by PCR NEGATIVE NEGATIVE Final    Comment: (NOTE) The Xpert Xpress SARS-CoV-2/FLU/RSV plus assay is intended as an aid in the diagnosis of influenza from Nasopharyngeal swab specimens and should not be used as a sole basis for treatment. Nasal washings and aspirates are unacceptable for Xpert Xpress SARS-CoV-2/FLU/RSV testing.  Fact Sheet for Patients: EntrepreneurPulse.com.au  Fact  Sheet for Healthcare Providers: IncredibleEmployment.be  This test is not yet approved or cleared by the [place] FDA and has been authorized for detection and/or diagnosis of SARS-CoV-2 by FDA under an Emergency Use Authorization (EUA). This EUA will remain in effect (meaning this test can be used) for the duration of the COVID-19 declaration under Section 564(b)(1) of the Act, 21 U.S.C. section 360bbb-3(b)(1), unless the authorization is terminated or revoked.  Performed at [facility], [place] [address], [place],  [address]      Labs: Basic Metabolic Panel: Recent Labs  Lab 10/16/21 0102 10/16/21 0456 10/18/21 0405 10/20/21 0743 10/21/21 1146 10/22/21 1033  NA 135  --  137 137 136 132*  K 4.0  --  3.7 4.1 4.3 4.2  CL 111  --  113* 110 110 105  CO2 16*  --  17* 20* 19* 19*  GLUCOSE 117*  --  93 93 98 107*  BUN 34*  --  29* 27* 34* 40*  CREATININE 2.73* 2.44* 2.05* 2.16* 2.16* 2.27*  CALCIUM 8.7*  --  8.8* 8.9 8.8* 9.1   Liver Function Tests: Recent Labs  Lab 10/16/21 0102 10/18/21 1250 10/21/21 1146  AST 52* 49* 47*  ALT 33 40 29  ALKPHOS 120 109 103  BILITOT 1.7* 1.4* 1.3*  PROT 7.7 7.0 6.9  ALBUMIN 4.1 3.5 3.3*   No results for input(s): LIPASE, AMYLASE in the last 168 hours. No results for input(s): AMMONIA in the last 168 hours. CBC: Recent Labs  Lab 10/16/21 0102 10/16/21 0456 10/18/21 0405 10/19/21 0715 10/19/21 1728 10/20/21 0743 10/21/21 1146 10/22/21 1033  WBC 6.7   < > 5.2 5.5  --  5.1 5.9 5.8  NEUTROABS 4.0  --  2.9  --   --   --   --   --   HGB 6.2*   < > 6.6* 6.6* 7.6* 7.7* 7.8* 7.4*  HCT 16.7*   < > 17.6* 17.7* 20.8* 21.6* 21.6* 20.7*  MCV 98.2   < > 96.7 97.8  --  96.9 95.6 95.8  PLT 312   < > 285 302  --  308 290 274   < > = values in this interval not displayed.   Cardiac Enzymes: No results for input(s): CKTOTAL, CKMB, CKMBINDEX, TROPONINI in the last 168 hours. BNP: Invalid  input(s): POCBNP CBG: No results for input(s): GLUCAP in the last 168 hours.  Time coordinating discharge: 30 minutes  Signed:  Donia Pounds  APRN, MSN, FNP-C Patient [place], [place] [address] [phone]  [facility] 10/22/2021, 6:13 PM

## 2021-10-25 DIAGNOSIS — E559 Vitamin D deficiency, unspecified: Secondary | ICD-10-CM | POA: Diagnosis not present

## 2021-10-25 DIAGNOSIS — M7918 Myalgia, other site: Secondary | ICD-10-CM | POA: Diagnosis not present

## 2021-10-25 DIAGNOSIS — D869 Sarcoidosis, unspecified: Secondary | ICD-10-CM | POA: Diagnosis not present

## 2021-10-25 DIAGNOSIS — D571 Sickle-cell disease without crisis: Secondary | ICD-10-CM | POA: Diagnosis not present

## 2021-10-25 DIAGNOSIS — G8929 Other chronic pain: Secondary | ICD-10-CM | POA: Diagnosis not present

## 2021-10-25 DIAGNOSIS — N189 Chronic kidney disease, unspecified: Secondary | ICD-10-CM | POA: Diagnosis not present

## 2021-11-17 DIAGNOSIS — K219 Gastro-esophageal reflux disease without esophagitis: Secondary | ICD-10-CM | POA: Diagnosis not present

## 2021-11-17 DIAGNOSIS — D571 Sickle-cell disease without crisis: Secondary | ICD-10-CM | POA: Diagnosis not present

## 2021-11-17 DIAGNOSIS — M1990 Unspecified osteoarthritis, unspecified site: Secondary | ICD-10-CM | POA: Diagnosis not present

## 2021-11-17 DIAGNOSIS — J449 Chronic obstructive pulmonary disease, unspecified: Secondary | ICD-10-CM | POA: Diagnosis not present

## 2021-11-17 DIAGNOSIS — D631 Anemia in chronic kidney disease: Secondary | ICD-10-CM | POA: Diagnosis not present

## 2021-11-17 DIAGNOSIS — N529 Male erectile dysfunction, unspecified: Secondary | ICD-10-CM | POA: Diagnosis not present

## 2021-11-17 DIAGNOSIS — I25119 Atherosclerotic heart disease of native coronary artery with unspecified angina pectoris: Secondary | ICD-10-CM | POA: Diagnosis not present

## 2021-11-17 DIAGNOSIS — R030 Elevated blood-pressure reading, without diagnosis of hypertension: Secondary | ICD-10-CM | POA: Diagnosis not present

## 2021-11-17 DIAGNOSIS — K5900 Constipation, unspecified: Secondary | ICD-10-CM | POA: Diagnosis not present

## 2021-11-17 DIAGNOSIS — N189 Chronic kidney disease, unspecified: Secondary | ICD-10-CM | POA: Diagnosis not present

## 2021-11-17 DIAGNOSIS — G8929 Other chronic pain: Secondary | ICD-10-CM | POA: Diagnosis not present

## 2021-11-17 DIAGNOSIS — R69 Illness, unspecified: Secondary | ICD-10-CM | POA: Diagnosis not present

## 2021-11-26 DIAGNOSIS — N1831 Chronic kidney disease, stage 3a: Secondary | ICD-10-CM | POA: Diagnosis not present

## 2021-11-26 DIAGNOSIS — N1832 Chronic kidney disease, stage 3b: Secondary | ICD-10-CM | POA: Diagnosis not present

## 2021-12-03 DIAGNOSIS — J4540 Moderate persistent asthma, uncomplicated: Secondary | ICD-10-CM | POA: Diagnosis not present

## 2022-01-07 ENCOUNTER — Ambulatory Visit (INDEPENDENT_AMBULATORY_CARE_PROVIDER_SITE_OTHER): Payer: Medicare HMO | Admitting: Nurse Practitioner

## 2022-01-07 ENCOUNTER — Encounter: Payer: Self-pay | Admitting: Nurse Practitioner

## 2022-01-07 ENCOUNTER — Ambulatory Visit: Payer: Medicare HMO | Admitting: Nurse Practitioner

## 2022-01-07 VITALS — BP 114/76 | HR 86 | Temp 98.1°F | Ht 73.0 in | Wt 134.4 lb

## 2022-01-07 DIAGNOSIS — R1013 Epigastric pain: Secondary | ICD-10-CM | POA: Diagnosis not present

## 2022-01-07 DIAGNOSIS — D571 Sickle-cell disease without crisis: Secondary | ICD-10-CM | POA: Diagnosis not present

## 2022-01-07 LAB — POCT URINALYSIS DIP (CLINITEK)
Bilirubin, UA: NEGATIVE
Blood, UA: NEGATIVE
Glucose, UA: NEGATIVE mg/dL
Ketones, POC UA: NEGATIVE mg/dL
Leukocytes, UA: NEGATIVE
Nitrite, UA: NEGATIVE
POC PROTEIN,UA: 30 — AB
Spec Grav, UA: 1.015 (ref 1.010–1.025)
Urobilinogen, UA: 0.2 E.U./dL
pH, UA: 6 (ref 5.0–8.0)

## 2022-01-07 MED ORDER — PANTOPRAZOLE SODIUM 40 MG PO TBEC: 40.0000 mg | DELAYED_RELEASE_TABLET | Freq: Every day | ORAL | 3 refills | Status: DC

## 2022-01-07 NOTE — Patient Instructions (Signed)
You were seen today in the [facility] [place] [facility] for abdominal pain. You were prescribed medications, please take as directed. Please follow up in 6 mths for reevaluation.  ?

## 2022-01-07 NOTE — Progress Notes (Signed)
? ?[facility] ?[place], [place]  [address] ?Phone:  [phone]   Fax:  [phone] ?Subjective:  ? Patient ID: Shawn Adams, male    DOB: 1975-11-22, 46 y.o.   MRN: [id number] ? ?Chief Complaint  ?Patient presents with  ? Follow-up  ?  Patient is here today for his 6 month follow up and to discuss abdominal pains associated with diarrhea. Patient states that he knows that he has gallstones.  ? ?HPI ?Shawn Adams 46 y.o. male  has a past medical history of Aneurysm ([place]), Heart murmur, Sarcoid, and Sickle cell anemia ([place]). To the [facility] for abdominal pain.  ? ?Patient states that he has had abdominal pain x 2 yrs. States that pain is primarily located in the mid epigastric area. Endorses diarrhea x 6 mths, with 1-2 loose stools per day, since being released from the hospital 09/2021, diarrhea has subsided. States that pain increases after meals and resolves with flatulence and/ bowel movement. Suspects symptoms related to chronic gallstones. Has been taking prescribed Pepcid with no improvement in symptoms. ? ?Denies any other concerns today. Sees hematologist regularly at [facility], next appointment May 1. Currently unemployed, quit job recently due to abdominal pain. Worked as a Sports administrator. Lives alone, separated, with four children.  ? ?Denies any fatigue, chest pain, shortness of breath, HA or dizziness. Denies any blurred vision, numbness or tingling. ? ? ?Past Medical History:  ?Diagnosis Date  ? Aneurysm ([place])   ? Pt states happened in 2011, Brain   ? Heart murmur   ? Sarcoid   ? Sickle cell anemia (HCC)   ? ? ?Past Surgical History:  ?Procedure Laterality Date  ? LYMPH NODE BIOPSY    ? NASAL SINUS SURGERY    ? ? ?Family History  ?Problem Relation Age of Onset  ? Diabetes Mother   ? Hypertension Father   ? Lupus Sister   ? ? ?Social History  ? ?Socioeconomic History  ? Marital status: Single  ?  Spouse name: Not on file  ? Number of children: Not on file   ? Years of education: Not on file  ? Highest education level: Not on file  ?Occupational History  ? Not on file  ?Tobacco Use  ? Smoking status: Former  ?  Types: Cigarettes  ? Smokeless tobacco: Never  ?Vaping Use  ? Vaping Use: Never used  ?Substance and Sexual Activity  ? Alcohol use: Not Currently  ?  Alcohol/week: 1.0 standard drink  ?  Types: 1 Cans of beer per week  ?  Comment: occasionally  ? Drug use: Not Currently  ?  Types: Marijuana  ? Sexual activity: Yes  ?  Birth control/protection: None  ?Other Topics Concern  ? Not on file  ?Social History Narrative  ? Not on file  ? ?Social Determinants of Health  ? ?Financial Resource Strain: Not on file  ?Food Insecurity: Not on file  ?Transportation Needs: Not on file  ?Physical Activity: Not on file  ?Stress: Not on file  ?Social Connections: Not on file  ?Intimate Partner Violence: Not on file  ? ? ?Outpatient Medications Prior to Visit  ?Medication Sig Dispense Refill  ? acetaminophen (TYLENOL) 500 MG tablet Take 500 mg by mouth every 6 (six) hours as needed for moderate pain or fever (pain).     ? Ascorbic Acid (VITAMIN C) 1000 MG tablet Take 1,000 mg by mouth daily.    ? cyanocobalamin 50 MCG tablet  Take 100 mcg by mouth daily.    ? cyclobenzaprine (FLEXERIL) 10 MG tablet Take 1 tablet (10 mg total) by mouth 3 (three) times daily as needed for muscle spasms. 15 tablet 0  ? docusate sodium (COLACE) 100 MG capsule Take 100 mg by mouth daily as needed for mild constipation.    ? ergocalciferol (VITAMIN D2) 1.25 MG (50000 UT) capsule Take 50,000 Units by mouth every 30 (thirty) days.     ? folic acid (FOLVITE) 1 MG tablet Take 1 tablet (1 mg total) by mouth daily. 90 tablet 3  ? hydroxyurea (HYDREA) 500 MG capsule Take 2 capsules (1,000 mg total) by mouth daily. May take with food to minimize GI side effects. 180 capsule 3  ? INCRUSE ELLIPTA 62.5 MCG/INH AEPB Inhale 1 puff into the lungs daily as needed (for shortness of breath).    ? Melatonin 10 MG TABS Take  10 mg by mouth at bedtime.    ? OXBRYTA 500 MG TABS tablet Take 1,500 mg by mouth at bedtime.    ? Oxycodone HCl 20 MG TABS Take 20 mg by mouth every 4 (four) hours as needed (pain).     ? sodium bicarbonate 650 MG tablet Take 650 mg by mouth daily.    ? SYMBICORT 80-4.5 MCG/ACT inhaler Inhale 2 puffs into the lungs daily as needed (for shortness of breath).    ? famotidine (PEPCID) 10 MG tablet Take 10 mg by mouth daily as needed for heartburn or indigestion.    ? albuterol (VENTOLIN HFA) 108 (90 Base) MCG/ACT inhaler Inhale into the lungs. (Patient not taking: Reported on 10/16/2021)    ? budesonide-formoterol (SYMBICORT) 160-4.5 MCG/ACT inhaler Inhale 2 puffs into the lungs 2 (two) times daily.    ? hydroxychloroquine (PLAQUENIL) 200 MG tablet Take 200 mg by mouth daily. (Patient not taking: Reported on 10/16/2021)    ? ?No facility-administered medications prior to visit.  ? ? ?No Known Allergies ? ?Review of Systems  ?Constitutional:  Negative for chills, fever and malaise/fatigue.  ?Respiratory:  Negative for cough and shortness of breath.   ?Cardiovascular:  Negative for chest pain, palpitations and leg swelling.  ?Gastrointestinal:  Positive for abdominal pain. Negative for blood in stool, constipation, diarrhea, nausea and vomiting.  ?Skin: Negative.   ?Psychiatric/Behavioral:  Negative for depression. The patient is not nervous/anxious.   ?All other systems reviewed and are negative. ? ?   ?Objective:  ?  ?Physical Exam ?Vitals reviewed.  ?Constitutional:   ?   General: He is not in acute distress. ?   Appearance: Normal appearance.  ?HENT:  ?   Head: Normocephalic.  ?Cardiovascular:  ?   Rate and Rhythm: Normal rate and regular rhythm.  ?   Pulses: Normal pulses.  ?   Heart sounds: Normal heart sounds.  ?   Comments: No obvious peripheral edema ?Pulmonary:  ?   Effort: Pulmonary effort is normal.  ?   Breath sounds: Normal breath sounds.  ?Abdominal:  ?   General: Abdomen is flat. Bowel sounds are normal.  There is no distension.  ?   Palpations: Abdomen is soft. There is no mass.  ?   Tenderness: There is no abdominal tenderness. There is no right CVA tenderness, left CVA tenderness, guarding or rebound.  ?   Hernia: No hernia is present.  ?Skin: ?   General: Skin is warm and dry.  ?   Capillary Refill: Capillary refill takes less than 2 seconds.  ?Neurological:  ?  Mental Status: He is alert.  ?Psychiatric:     ?   Mood and Affect: Mood normal.     ?   Behavior: Behavior normal.     ?   Thought Content: Thought content normal.     ?   Judgment: Judgment normal.  ? ? ?BP 114/76   Pulse 86   Temp 98.1 ?F (36.7 ?C)   Ht _0  (1.854 m)   Wt 134 lb 6.4 oz (61 kg)   SpO2 99%   BMI 17.73 kg/m?  ?Wt Readings from Last 3 Encounters:  ?01/07/22 134 lb 6.4 oz (61 kg)  ?10/16/21 135 lb (61.2 kg)  ?07/09/21 134 lb 9.6 oz (61.1 kg)  ? ? ?Immunization History  ?Administered Date(s) Administered  ? Influenza,inj,Quad PF,6+ Mos 06/21/2014, 07/08/2015  ? Influenza,inj,quad, With Preservative 05/11/2016  ? Influenza-Unspecified 07/10/2018  ? PFIZER(Purple Top)SARS-COV-2 Vaccination 12/15/2019, 12/30/2019  ? Tdap 07/08/2015  ? ? ?Diabetic Foot Exam - Simple   ?No data filed ?  ? ? ?No results found for: TSH ?Lab Results  ?Component Value Date  ? WBC 5.8 10/22/2021  ? HGB 7.4 (L) 10/22/2021  ? HCT 20.7 (L) 10/22/2021  ? MCV 95.8 10/22/2021  ? PLT 274 10/22/2021  ? ?Lab Results  ?Component Value Date  ? NA 132 (L) 10/22/2021  ? K 4.2 10/22/2021  ? CO2 19 (L) 10/22/2021  ? GLUCOSE 107 (H) 10/22/2021  ? BUN 40 (H) 10/22/2021  ? CREATININE 2.27 (H) 10/22/2021  ? BILITOT 1.3 (H) 10/21/2021  ? ALKPHOS 103 10/21/2021  ? AST 47 (H) 10/21/2021  ? ALT 29 10/21/2021  ? PROT 6.9 10/21/2021  ? ALBUMIN 3.3 (L) 10/21/2021  ? CALCIUM 9.1 10/22/2021  ? ANIONGAP 8 10/22/2021  ? EGFR 42 (L) 12/16/2020  ? ?No results found for: CHOL ?No results found for: HDL ?No results found for: [place] ?Lab Results  ?Component Value Date  ? TRIG 182 (H)  07/11/2020  ? ?No results found for: CHOLHDL ?No results found for: HGBA1C ? ?   ?Assessment & Plan:  ? ?Problem List Items Addressed This Visit   ? ?  ? Other  ? Hb-SS disease without crisis [facility]) - Primary  ? R

## 2022-01-10 DIAGNOSIS — H40003 Preglaucoma, unspecified, bilateral: Secondary | ICD-10-CM | POA: Diagnosis not present

## 2022-01-10 DIAGNOSIS — Z79899 Other long term (current) drug therapy: Secondary | ICD-10-CM | POA: Diagnosis not present

## 2022-01-10 DIAGNOSIS — H04123 Dry eye syndrome of bilateral lacrimal glands: Secondary | ICD-10-CM | POA: Diagnosis not present

## 2022-01-10 DIAGNOSIS — D869 Sarcoidosis, unspecified: Secondary | ICD-10-CM | POA: Diagnosis not present

## 2022-01-10 DIAGNOSIS — N1831 Chronic kidney disease, stage 3a: Secondary | ICD-10-CM | POA: Diagnosis not present

## 2022-01-10 DIAGNOSIS — D571 Sickle-cell disease without crisis: Secondary | ICD-10-CM | POA: Diagnosis not present

## 2022-01-10 DIAGNOSIS — H527 Unspecified disorder of refraction: Secondary | ICD-10-CM | POA: Diagnosis not present

## 2022-01-24 DIAGNOSIS — E559 Vitamin D deficiency, unspecified: Secondary | ICD-10-CM | POA: Diagnosis not present

## 2022-01-24 DIAGNOSIS — Z79899 Other long term (current) drug therapy: Secondary | ICD-10-CM | POA: Diagnosis not present

## 2022-01-24 DIAGNOSIS — L739 Follicular disorder, unspecified: Secondary | ICD-10-CM | POA: Diagnosis not present

## 2022-01-24 DIAGNOSIS — D571 Sickle-cell disease without crisis: Secondary | ICD-10-CM | POA: Diagnosis not present

## 2022-01-24 DIAGNOSIS — N189 Chronic kidney disease, unspecified: Secondary | ICD-10-CM | POA: Diagnosis not present

## 2022-01-24 DIAGNOSIS — N1831 Chronic kidney disease, stage 3a: Secondary | ICD-10-CM | POA: Diagnosis not present

## 2022-01-24 DIAGNOSIS — G8929 Other chronic pain: Secondary | ICD-10-CM | POA: Diagnosis not present

## 2022-01-24 DIAGNOSIS — L738 Other specified follicular disorders: Secondary | ICD-10-CM | POA: Diagnosis not present

## 2022-02-23 DIAGNOSIS — R519 Headache, unspecified: Secondary | ICD-10-CM | POA: Diagnosis not present

## 2022-02-23 DIAGNOSIS — Z823 Family history of stroke: Secondary | ICD-10-CM | POA: Diagnosis not present

## 2022-02-23 DIAGNOSIS — R202 Paresthesia of skin: Secondary | ICD-10-CM | POA: Diagnosis not present

## 2022-02-23 DIAGNOSIS — Z8679 Personal history of other diseases of the circulatory system: Secondary | ICD-10-CM | POA: Diagnosis not present

## 2022-02-23 DIAGNOSIS — R413 Other amnesia: Secondary | ICD-10-CM | POA: Diagnosis not present

## 2022-02-23 DIAGNOSIS — R208 Other disturbances of skin sensation: Secondary | ICD-10-CM | POA: Diagnosis not present

## 2022-02-23 DIAGNOSIS — R200 Anesthesia of skin: Secondary | ICD-10-CM | POA: Diagnosis not present

## 2022-02-23 DIAGNOSIS — I671 Cerebral aneurysm, nonruptured: Secondary | ICD-10-CM | POA: Diagnosis not present

## 2022-02-23 DIAGNOSIS — G478 Other sleep disorders: Secondary | ICD-10-CM | POA: Diagnosis not present

## 2022-03-30 ENCOUNTER — Encounter: Payer: Self-pay | Admitting: Nurse Practitioner

## 2022-04-19 ENCOUNTER — Encounter: Payer: Self-pay | Admitting: Gastroenterology

## 2022-05-16 DIAGNOSIS — M79631 Pain in right forearm: Secondary | ICD-10-CM | POA: Diagnosis not present

## 2022-05-16 DIAGNOSIS — E559 Vitamin D deficiency, unspecified: Secondary | ICD-10-CM | POA: Diagnosis not present

## 2022-05-16 DIAGNOSIS — D571 Sickle-cell disease without crisis: Secondary | ICD-10-CM | POA: Diagnosis not present

## 2022-05-16 DIAGNOSIS — Z9189 Other specified personal risk factors, not elsewhere classified: Secondary | ICD-10-CM | POA: Diagnosis not present

## 2022-05-16 DIAGNOSIS — M7918 Myalgia, other site: Secondary | ICD-10-CM | POA: Diagnosis not present

## 2022-05-16 DIAGNOSIS — I272 Pulmonary hypertension, unspecified: Secondary | ICD-10-CM | POA: Diagnosis not present

## 2022-05-16 DIAGNOSIS — D869 Sarcoidosis, unspecified: Secondary | ICD-10-CM | POA: Diagnosis not present

## 2022-05-17 ENCOUNTER — Ambulatory Visit: Payer: Medicare HMO | Admitting: Gastroenterology

## 2022-05-17 DIAGNOSIS — N1831 Chronic kidney disease, stage 3a: Secondary | ICD-10-CM | POA: Diagnosis not present

## 2022-05-17 DIAGNOSIS — D571 Sickle-cell disease without crisis: Secondary | ICD-10-CM | POA: Diagnosis not present

## 2022-05-17 DIAGNOSIS — I82711 Chronic embolism and thrombosis of superficial veins of right upper extremity: Secondary | ICD-10-CM | POA: Diagnosis not present

## 2022-06-03 DIAGNOSIS — J4540 Moderate persistent asthma, uncomplicated: Secondary | ICD-10-CM | POA: Diagnosis not present

## 2022-06-03 DIAGNOSIS — D571 Sickle-cell disease without crisis: Secondary | ICD-10-CM | POA: Diagnosis not present

## 2022-06-14 ENCOUNTER — Ambulatory Visit: Payer: Medicare HMO | Admitting: Gastroenterology

## 2022-07-08 ENCOUNTER — Encounter: Payer: Self-pay | Admitting: Nurse Practitioner

## 2022-07-08 ENCOUNTER — Ambulatory Visit (INDEPENDENT_AMBULATORY_CARE_PROVIDER_SITE_OTHER): Payer: Medicare HMO | Admitting: Nurse Practitioner

## 2022-07-08 VITALS — BP 112/60 | HR 88 | Temp 98.0°F | Ht 73.0 in | Wt 133.8 lb

## 2022-07-08 DIAGNOSIS — G8929 Other chronic pain: Secondary | ICD-10-CM

## 2022-07-08 DIAGNOSIS — D571 Sickle-cell disease without crisis: Secondary | ICD-10-CM | POA: Diagnosis not present

## 2022-07-08 DIAGNOSIS — K529 Noninfective gastroenteritis and colitis, unspecified: Secondary | ICD-10-CM

## 2022-07-08 DIAGNOSIS — R109 Unspecified abdominal pain: Secondary | ICD-10-CM

## 2022-07-08 NOTE — Patient Instructions (Signed)
1. Chronic abdominal pain  - Ambulatory referral to Gastroenterology  2. Chronic diarrhea  - Ambulatory referral to Gastroenterology  3. Hb-SS disease without crisis ([place])  - Sickle Cell Panel  Follow up:  Follow up in 3 months or sooner if needed

## 2022-07-08 NOTE — Assessment & Plan Note (Signed)
-   Ambulatory referral to Gastroenterology  2. Chronic diarrhea  - Ambulatory referral to Gastroenterology  3. Hb-SS disease without crisis ([place]awn Adams)  - Sickle Cell Panel  Follow up:  Follow up in 3 months or sooner if needed

## 2022-07-08 NOTE — Progress Notes (Signed)
@Patient  ID: Shawn Adams, male    DOB: [birth year], 46 y.o.   MRN: [id number]  Chief Complaint  Patient presents with   Abdominal Pain    Resend referral for GI.    Referring provider: Teena Dunk, NP   HPI  Shawn Adams 46 y.o. male  has a past medical history of Aneurysm ([place]), Heart murmur, Sarcoid, and Sickle cell anemia ([place]). To the [facility] for abdominal pain.    Patient states that he has had abdominal pain x 2 yrs. States that pain is primarily located in the mid epigastric area. Endorses diarrhea x 6 mths, with 1-2 loose stools per day. States that pain increases after meals and resolves with flatulence and/ bowel movement. Suspects symptoms related to chronic gallstones. Has been taking prescribed protonix with no improvement in symptoms.  Patient was referred to GI his last visit with Verlon Setting.  GI did try to contact him for an appointment but patient did not return call.  He is requesting that we resend a referral to GI again today. Denies f/c/s, n/v/d, hemoptysis, PND, leg swelling Denies chest pain or edema       No Known Allergies  Immunization History  Administered Date(s) Administered   Influenza,inj,Quad PF,6+ Mos 06/21/2014, 07/08/2015   Influenza,inj,quad, With Preservative 05/11/2016   Influenza-Unspecified 07/10/2018   PFIZER(Purple Top)SARS-COV-2 Vaccination 12/15/2019, 12/30/2019   Tdap 07/08/2015    Past Medical History:  Diagnosis Date   Aneurysm ([place])    Pt states happened in 2011, Brain    Heart murmur    Sarcoid    Sickle cell anemia (HCC)     Tobacco History: Social History   Tobacco Use  Smoking Status Former   Types: Cigarettes  Smokeless Tobacco Never   Counseling given: Not Answered   Outpatient Encounter Medications as of 07/08/2022  Medication Sig   acetaminophen (TYLENOL) 500 MG tablet Take 500 mg by mouth every 6 (six) hours as needed for moderate pain or fever (pain).    albuterol (VENTOLIN HFA) 108 (90  Base) MCG/ACT inhaler Inhale into the lungs. (Patient not taking: Reported on 10/16/2021)   Ascorbic Acid (VITAMIN C) 1000 MG tablet Take 1,000 mg by mouth daily.   budesonide-formoterol (SYMBICORT) 160-4.5 MCG/ACT inhaler Inhale 2 puffs into the lungs 2 (two) times daily.   cyanocobalamin 50 MCG tablet Take 100 mcg by mouth daily.   cyclobenzaprine (FLEXERIL) 10 MG tablet Take 1 tablet (10 mg total) by mouth 3 (three) times daily as needed for muscle spasms.   docusate sodium (COLACE) 100 MG capsule Take 100 mg by mouth daily as needed for mild constipation.   ergocalciferol (VITAMIN D2) 1.25 MG (50000 UT) capsule Take 50,000 Units by mouth every 30 (thirty) days.    folic acid (FOLVITE) 1 MG tablet Take 1 tablet (1 mg total) by mouth daily.   hydroxychloroquine (PLAQUENIL) 200 MG tablet Take 200 mg by mouth daily. (Patient not taking: Reported on 10/16/2021)   hydroxyurea (HYDREA) 500 MG capsule Take 2 capsules (1,000 mg total) by mouth daily. May take with food to minimize GI side effects.   INCRUSE ELLIPTA 62.5 MCG/INH AEPB Inhale 1 puff into the lungs daily as needed (for shortness of breath).   Melatonin 10 MG TABS Take 10 mg by mouth at bedtime.   OXBRYTA 500 MG TABS tablet Take 1,500 mg by mouth at bedtime.   pantoprazole (PROTONIX) 40 MG tablet Take 1 tablet (40 mg total) by mouth daily.   sodium bicarbonate 650 MG  tablet Take 650 mg by mouth daily.   SYMBICORT 80-4.5 MCG/ACT inhaler Inhale 2 puffs into the lungs daily as needed (for shortness of breath).   [DISCONTINUED] Oxycodone HCl 20 MG TABS Take 20 mg by mouth every 4 (four) hours as needed (pain).    No facility-administered encounter medications on file as of 07/08/2022.     Review of Systems  Review of Systems  Constitutional: Negative.   HENT: Negative.    Cardiovascular: Negative.   Gastrointestinal:  Positive for abdominal pain and diarrhea.  Allergic/Immunologic: Negative.   Neurological: Negative.    Psychiatric/Behavioral: Negative.         Physical Exam  BP 112/60   Pulse 88   Temp 98 F (36.7 C) (Temporal)   Ht 6\' 1"  (1.854 m)   Wt 133 lb 12.8 oz (60.7 kg)   SpO2 98%   BMI 17.65 kg/m   Wt Readings from Last 5 Encounters:  07/08/22 133 lb 12.8 oz (60.7 kg)  01/07/22 134 lb 6.4 oz (61 kg)  10/16/21 135 lb (61.2 kg)  07/09/21 134 lb 9.6 oz (61.1 kg)  03/18/21 126 lb 0.2 oz (57.2 kg)     Physical Exam Vitals and nursing note reviewed.  Constitutional:      General: He is not in acute distress.    Appearance: He is well-developed.  Cardiovascular:     Rate and Rhythm: Normal rate and regular rhythm.  Pulmonary:     Effort: Pulmonary effort is normal.     Breath sounds: Normal breath sounds.  Abdominal:     Tenderness: There is abdominal tenderness in the epigastric area.  Skin:    General: Skin is warm and dry.  Neurological:     Mental Status: He is alert and oriented to person, place, and time.      Lab Results:  CBC    Component Value Date/Time   WBC 5.8 10/22/2021 1033   RBC 2.16 (L) 10/22/2021 1033   HGB 7.4 (L) 10/22/2021 1033   HGB 8.5 (L) 12/16/2020 1434   HCT 20.7 (L) 10/22/2021 1033   HCT 23.3 (L) 12/16/2020 1434   PLT 274 10/22/2021 1033   PLT 371 12/16/2020 1434   MCV 95.8 10/22/2021 1033   MCV 118 (H) 12/16/2020 1434   MCH 34.3 (H) 10/22/2021 1033   MCHC 35.7 10/22/2021 1033   RDW 19.7 (H) 10/22/2021 1033   RDW 16.3 (H) 12/16/2020 1434   LYMPHSABS 1.4 10/18/2021 0405   LYMPHSABS 0.9 12/16/2020 1434   MONOABS 0.5 10/18/2021 0405   EOSABS 0.2 10/18/2021 0405   EOSABS 0.0 12/16/2020 1434   BASOSABS 0.0 10/18/2021 0405   BASOSABS 0.0 12/16/2020 1434    BMET    Component Value Date/Time   NA 132 (L) 10/22/2021 1033   NA 139 12/16/2020 1434   K 4.2 10/22/2021 1033   CL 105 10/22/2021 1033   CO2 19 (L) 10/22/2021 1033   GLUCOSE 107 (H) 10/22/2021 1033   BUN 40 (H) 10/22/2021 1033   BUN 31 (H) 12/16/2020 1434   CREATININE  2.27 (H) 10/22/2021 1033   CREATININE 1.39 (H) 05/11/2016 1100   CALCIUM 9.1 10/22/2021 1033   GFRNONAA 35 (L) 10/22/2021 1033   GFRNONAA 63 05/11/2016 1100   GFRAA 18 (L) 04/12/2020 0641   GFRAA 73 05/11/2016 1100    BNP No results found for: "BNP"  ProBNP No results found for: "PROBNP"  Imaging: No results found.   Assessment & Plan:   Chronic abdominal pain -  Ambulatory referral to Gastroenterology  2. Chronic diarrhea  - Ambulatory referral to Gastroenterology  3. Hb-SS disease without crisis (HCC)  - Sickle Cell Panel  Follow up:  Follow up in 3 months or sooner if needed     Ivonne Andrew, NP 07/08/2022

## 2022-07-09 ENCOUNTER — Telehealth: Payer: Self-pay | Admitting: Nurse Practitioner

## 2022-07-09 LAB — CMP14+CBC/D/PLT+FER+RETIC+V...
ALT: 28 IU/L (ref 0–44)
AST: 42 IU/L — ABNORMAL HIGH (ref 0–40)
Albumin/Globulin Ratio: 1.2 (ref 1.2–2.2)
Albumin: 3.8 g/dL — ABNORMAL LOW (ref 4.1–5.1)
Alkaline Phosphatase: 150 IU/L — ABNORMAL HIGH (ref 44–121)
BUN/Creatinine Ratio: 8 — ABNORMAL LOW (ref 9–20)
BUN: 19 mg/dL (ref 6–24)
Basophils Absolute: 0 10*3/uL (ref 0.0–0.2)
Basos: 1 %
Bilirubin Total: 0.8 mg/dL (ref 0.0–1.2)
CO2: 16 mmol/L — ABNORMAL LOW (ref 20–29)
Calcium: 8.9 mg/dL (ref 8.7–10.2)
Chloride: 109 mmol/L — ABNORMAL HIGH (ref 96–106)
Creatinine, Ser: 2.29 mg/dL — ABNORMAL HIGH (ref 0.76–1.27)
EOS (ABSOLUTE): 0.3 10*3/uL (ref 0.0–0.4)
Eos: 8 %
Ferritin: 2294 ng/mL — ABNORMAL HIGH (ref 30–400)
Globulin, Total: 3.2 g/dL (ref 1.5–4.5)
Glucose: 132 mg/dL — ABNORMAL HIGH (ref 70–99)
Hematocrit: 16.8 % — CL (ref 37.5–51.0)
Hemoglobin: 6.3 g/dL — CL (ref 13.0–17.7)
Immature Grans (Abs): 0 10*3/uL (ref 0.0–0.1)
Immature Granulocytes: 1 %
Lymphocytes Absolute: 1.3 10*3/uL (ref 0.7–3.1)
Lymphs: 34 %
MCH: 41.7 pg — ABNORMAL HIGH (ref 26.6–33.0)
MCHC: 37.5 g/dL — ABNORMAL HIGH (ref 31.5–35.7)
MCV: 111 fL — ABNORMAL HIGH (ref 79–97)
Monocytes Absolute: 0.1 10*3/uL (ref 0.1–0.9)
Monocytes: 4 %
NRBC: 9 % — ABNORMAL HIGH (ref 0–0)
Neutrophils Absolute: 2 10*3/uL (ref 1.4–7.0)
Neutrophils: 52 %
Platelets: 230 10*3/uL (ref 150–450)
Potassium: 4 mmol/L (ref 3.5–5.2)
RBC: 1.51 x10E6/uL — CL (ref 4.14–5.80)
RDW: 17.1 % — ABNORMAL HIGH (ref 11.6–15.4)
Retic Ct Pct: 5 % — ABNORMAL HIGH (ref 0.6–2.6)
Sodium: 140 mmol/L (ref 134–144)
Total Protein: 7 g/dL (ref 6.0–8.5)
Vit D, 25-Hydroxy: 17.9 ng/mL — ABNORMAL LOW (ref 30.0–100.0)
WBC: 3.7 10*3/uL (ref 3.4–10.8)
eGFR: 35 mL/min/{1.73_m2} — ABNORMAL LOW (ref >59)

## 2022-07-09 NOTE — Telephone Encounter (Signed)
Attempted to call patient. No answer. No voicemail. Sent my chart message with the following:  Hello,  I tried to contact you by phone call, but was unable to reach you. Your HGB was critical low. Please go to the ED for further evaluation of this.  Thanks,  Lazaro Arms, FNP-C

## 2022-07-11 ENCOUNTER — Telehealth: Payer: Self-pay | Admitting: Family Medicine

## 2022-07-11 NOTE — Telephone Encounter (Signed)
Kamilo is a 46 year old male with sickle cell disease that was evaluated in primary care on last week.  Patient's PCP reviewed his laboratory values and he was found to have a critical lab.  PCP has attempted to reach out several times and has been unable to leave a voicemail. Writer also attempted to reach patient without success.  Donia Pounds  APRN, MSN, FNP-C Patient [facility] [address] [place], [place] [address] [phone]

## 2022-08-15 DIAGNOSIS — Z9981 Dependence on supplemental oxygen: Secondary | ICD-10-CM | POA: Diagnosis not present

## 2022-08-15 DIAGNOSIS — D57219 Sickle-cell/Hb-C disease with crisis, unspecified: Secondary | ICD-10-CM | POA: Diagnosis not present

## 2022-08-15 DIAGNOSIS — R0902 Hypoxemia: Secondary | ICD-10-CM | POA: Diagnosis not present

## 2022-08-15 DIAGNOSIS — J849 Interstitial pulmonary disease, unspecified: Secondary | ICD-10-CM | POA: Diagnosis not present

## 2022-08-15 DIAGNOSIS — R0609 Other forms of dyspnea: Secondary | ICD-10-CM | POA: Diagnosis not present

## 2022-08-15 DIAGNOSIS — J45909 Unspecified asthma, uncomplicated: Secondary | ICD-10-CM | POA: Diagnosis not present

## 2022-08-15 DIAGNOSIS — Z87891 Personal history of nicotine dependence: Secondary | ICD-10-CM | POA: Diagnosis not present

## 2022-08-15 DIAGNOSIS — D5700 Hb-SS disease with crisis, unspecified: Secondary | ICD-10-CM | POA: Diagnosis not present

## 2022-08-15 DIAGNOSIS — Z79899 Other long term (current) drug therapy: Secondary | ICD-10-CM | POA: Diagnosis not present

## 2022-08-15 DIAGNOSIS — J9601 Acute respiratory failure with hypoxia: Secondary | ICD-10-CM | POA: Diagnosis not present

## 2022-08-15 DIAGNOSIS — Z7952 Long term (current) use of systemic steroids: Secondary | ICD-10-CM | POA: Diagnosis not present

## 2022-08-15 DIAGNOSIS — N183 Chronic kidney disease, stage 3 unspecified: Secondary | ICD-10-CM | POA: Diagnosis not present

## 2022-08-15 DIAGNOSIS — N1831 Chronic kidney disease, stage 3a: Secondary | ICD-10-CM | POA: Diagnosis not present

## 2022-08-15 DIAGNOSIS — J984 Other disorders of lung: Secondary | ICD-10-CM | POA: Diagnosis not present

## 2022-08-15 DIAGNOSIS — D860 Sarcoidosis of lung: Secondary | ICD-10-CM | POA: Diagnosis not present

## 2022-08-15 DIAGNOSIS — N179 Acute kidney failure, unspecified: Secondary | ICD-10-CM | POA: Diagnosis not present

## 2022-08-15 DIAGNOSIS — R590 Localized enlarged lymph nodes: Secondary | ICD-10-CM | POA: Diagnosis not present

## 2022-08-15 DIAGNOSIS — Z7951 Long term (current) use of inhaled steroids: Secondary | ICD-10-CM | POA: Diagnosis not present

## 2022-08-15 DIAGNOSIS — D869 Sarcoidosis, unspecified: Secondary | ICD-10-CM | POA: Diagnosis not present

## 2022-08-15 DIAGNOSIS — E860 Dehydration: Secondary | ICD-10-CM | POA: Diagnosis not present

## 2022-08-15 DIAGNOSIS — R0602 Shortness of breath: Secondary | ICD-10-CM | POA: Diagnosis not present

## 2022-08-15 DIAGNOSIS — Z832 Family history of diseases of the blood and blood-forming organs and certain disorders involving the immune mechanism: Secondary | ICD-10-CM | POA: Diagnosis not present

## 2022-08-15 DIAGNOSIS — M25552 Pain in left hip: Secondary | ICD-10-CM | POA: Diagnosis not present

## 2022-08-15 DIAGNOSIS — Z8616 Personal history of COVID-19: Secondary | ICD-10-CM | POA: Diagnosis not present

## 2022-08-15 DIAGNOSIS — Z20822 Contact with and (suspected) exposure to covid-19: Secondary | ICD-10-CM | POA: Diagnosis not present

## 2022-08-15 DIAGNOSIS — Z91148 Patient's other noncompliance with medication regimen for other reason: Secondary | ICD-10-CM | POA: Diagnosis not present

## 2022-08-15 DIAGNOSIS — J4540 Moderate persistent asthma, uncomplicated: Secondary | ICD-10-CM | POA: Diagnosis not present

## 2022-08-15 DIAGNOSIS — K219 Gastro-esophageal reflux disease without esophagitis: Secondary | ICD-10-CM | POA: Diagnosis not present

## 2022-08-19 DIAGNOSIS — R0609 Other forms of dyspnea: Secondary | ICD-10-CM | POA: Diagnosis not present

## 2022-08-19 DIAGNOSIS — D5700 Hb-SS disease with crisis, unspecified: Secondary | ICD-10-CM | POA: Diagnosis not present

## 2022-08-20 DIAGNOSIS — N183 Chronic kidney disease, stage 3 unspecified: Secondary | ICD-10-CM | POA: Diagnosis not present

## 2022-08-20 DIAGNOSIS — D5700 Hb-SS disease with crisis, unspecified: Secondary | ICD-10-CM | POA: Diagnosis not present

## 2022-08-20 DIAGNOSIS — N179 Acute kidney failure, unspecified: Secondary | ICD-10-CM | POA: Diagnosis not present

## 2022-08-20 DIAGNOSIS — D57819 Other sickle-cell disorders with crisis, unspecified: Secondary | ICD-10-CM | POA: Diagnosis not present

## 2022-08-22 DIAGNOSIS — J449 Chronic obstructive pulmonary disease, unspecified: Secondary | ICD-10-CM | POA: Diagnosis not present

## 2022-08-22 DIAGNOSIS — Z87891 Personal history of nicotine dependence: Secondary | ICD-10-CM | POA: Diagnosis not present

## 2022-08-23 ENCOUNTER — Ambulatory Visit: Payer: Self-pay | Admitting: Nurse Practitioner

## 2022-09-05 ENCOUNTER — Encounter: Payer: Self-pay | Admitting: Nurse Practitioner

## 2022-09-05 ENCOUNTER — Ambulatory Visit (INDEPENDENT_AMBULATORY_CARE_PROVIDER_SITE_OTHER): Payer: Medicare HMO | Admitting: Nurse Practitioner

## 2022-09-05 VITALS — BP 126/62 | HR 70 | Temp 97.7°F | Ht 73.0 in | Wt 136.2 lb

## 2022-09-05 DIAGNOSIS — K029 Dental caries, unspecified: Secondary | ICD-10-CM | POA: Diagnosis not present

## 2022-09-05 DIAGNOSIS — Z23 Encounter for immunization: Secondary | ICD-10-CM

## 2022-09-05 DIAGNOSIS — D869 Sarcoidosis, unspecified: Secondary | ICD-10-CM | POA: Diagnosis not present

## 2022-09-05 DIAGNOSIS — Z1211 Encounter for screening for malignant neoplasm of colon: Secondary | ICD-10-CM

## 2022-09-05 NOTE — Assessment & Plan Note (Signed)
Patient was recently admitted in the hospital for sickle cell crisis on November 20 , 2023 recent CTA was concerning for underlying pulmonary sarcoidosis flare patient was started on prednisone 20 mg for 1 month which will be tapered to 10 mg for 1 month, Bactrim for PJP prophylaxis on Monday Wednesday Friday prescribed patient was encouraged to follow-up with pulmonology in 4 to 6 weeks and to also follow-up with rheumatology.  Patient stated that he has upcoming appointment with pulmonology in January, needs to schedule an appointment with rheumatology.  Need to maintain close follow-up with rheumatology and pulmonology discussed.  Patient was encouraged to take current medications as ordered.

## 2022-09-05 NOTE — Assessment & Plan Note (Signed)
BP Readings from Last 3 Encounters:  09/05/22 126/62  07/08/22 112/60  01/07/22 114/76  Blood pressure is normal in the office today. Okay for the patient to proceed with his dental filling.

## 2022-09-05 NOTE — Patient Instructions (Signed)

## 2022-09-05 NOTE — Progress Notes (Signed)
Established Patient Office Visit  Subjective:  Patient ID: Shawn Adams, male    DOB: March 24, 1976  Age: 46 y.o. MRN: [id number]  CC:  Chief Complaint  Patient presents with   Follow-up    B/p and form     HPI Shawn Adams is a 46 y.o. male with past medical history of persistent asthma without complication, sickle cell anemia, headache disorder, sarcoidosis, vitamin D deficiency presents for complaints of elevated blood pressure. .  Patient stated that he was at the dentist office about 2 weeks ago to get fillings done, his blood pressure was found to be elevated at 169/95.  He has no previous diagnosis of hypertension.  He checked his blood pressure at home yesterday and it was around 118/81.  Has chronic intermittent headaches which he has been taking Tylenol as needed for.  He denies fever, chills, chest pain, edema, tooth ache.   Pulmonary sarcoidosis.  Patient was recently admitted in the hospital for sickle cell crisis on November 20 , 2023 recent CTA was concerning for underlying pulmonary sarcoidosis flare patient was started on prednisone 20 mg for 1 month which will be tapered to 10 mg for 1 month, Bactrim for PJP prophylaxis on Monday Wednesday Friday prescribed patient was encouraged to follow-up with pulmonology in 4 to 6 weeks and to also follow-up with rheumatology.  Patient stated that he has upcoming appointment with pulmonology in January, needs to schedule an appointment with rheumatology.    Due for flu vaccine flu vaccine given in the office today.  Cologuard ordered to screen for colon cancer.     Past Medical History:  Diagnosis Date   Aneurysm ([place])    Pt states happened in 2011, Brain    Heart murmur    Sarcoid    Sickle cell anemia (HCC)     Past Surgical History:  Procedure Laterality Date   LYMPH NODE BIOPSY     NASAL SINUS SURGERY      Family History  Problem Relation Age of Onset   Diabetes Mother    Hypertension Father    Lupus Sister      Social History   Socioeconomic History   Marital status: Single    Spouse name: Not on file   Number of children: Not on file   Years of education: Not on file   Highest education level: Not on file  Occupational History   Not on file  Tobacco Use   Smoking status: Former    Types: Cigarettes   Smokeless tobacco: Never  Vaping Use   Vaping Use: Never used  Substance and Sexual Activity   Alcohol use: Not Currently    Alcohol/week: 1.0 standard drink of alcohol    Types: 1 Cans of beer per week    Comment: occasionally   Drug use: Not Currently    Types: Marijuana   Sexual activity: Yes    Birth control/protection: None  Other Topics Concern   Not on file  Social History Narrative   Not on file   Social Determinants of Health   Financial Resource Strain: Not on file  Food Insecurity: Not on file  Transportation Needs: Not on file  Physical Activity: Not on file  Stress: Not on file  Social Connections: Not on file  Intimate Partner Violence: Not on file    Outpatient Medications Prior to Visit  Medication Sig Dispense Refill   acetaminophen (TYLENOL) 500 MG tablet Take 500 mg by mouth every 6 (six) hours as needed  for moderate pain or fever (pain).      albuterol (VENTOLIN HFA) 108 (90 Base) MCG/ACT inhaler Inhale into the lungs.     Ascorbic Acid (VITAMIN C) 1000 MG tablet Take 1,000 mg by mouth daily.     budesonide-formoterol (SYMBICORT) 160-4.5 MCG/ACT inhaler Inhale 2 puffs into the lungs 2 (two) times daily.     cyanocobalamin 50 MCG tablet Take 100 mcg by mouth daily.     cyclobenzaprine (FLEXERIL) 10 MG tablet Take 1 tablet (10 mg total) by mouth 3 (three) times daily as needed for muscle spasms. 15 tablet 0   docusate sodium (COLACE) 100 MG capsule Take 100 mg by mouth daily as needed for mild constipation.     ergocalciferol (VITAMIN D2) 1.25 MG (50000 UT) capsule Take 50,000 Units by mouth every 30 (thirty) days.      folic acid (FOLVITE) 1 MG tablet  Take 1 tablet (1 mg total) by mouth daily. 90 tablet 3   hydroxychloroquine (PLAQUENIL) 200 MG tablet Take 200 mg by mouth daily.     hydroxyurea (HYDREA) 500 MG capsule Take 2 capsules (1,000 mg total) by mouth daily. May take with food to minimize GI side effects. 180 capsule 3   INCRUSE ELLIPTA 62.5 MCG/INH AEPB Inhale 1 puff into the lungs daily as needed (for shortness of breath).     Melatonin 10 MG TABS Take 10 mg by mouth at bedtime.     ondansetron (ZOFRAN) 4 MG tablet Take by mouth.     OXBRYTA 500 MG TABS tablet Take 1,500 mg by mouth at bedtime.     Oxycodone HCl 20 MG TABS Take by mouth.     pantoprazole (PROTONIX) 40 MG tablet Take 1 tablet (40 mg total) by mouth daily. 30 tablet 3   predniSONE (DELTASONE) 10 MG tablet Take 2 tablets (20 mg) by mouth daily for 1 month (11/24 - 12/24) then decrease to 1 tablet (10 mg) by mouth daily until follow up with pulmonology     sodium bicarbonate 650 MG tablet Take 650 mg by mouth daily.     sulfamethoxazole-trimethoprim (BACTRIM DS) 800-160 MG tablet Take by mouth.     SYMBICORT 80-4.5 MCG/ACT inhaler Inhale 2 puffs into the lungs daily as needed (for shortness of breath).     No facility-administered medications prior to visit.    No Known Allergies  ROS Review of Systems  Constitutional:  Negative for activity change, appetite change, chills and diaphoresis.  HENT:  Positive for dental problem. Negative for facial swelling and mouth sores.   Respiratory:  Negative for apnea, cough, choking, chest tightness, wheezing and stridor.   Cardiovascular:  Negative for chest pain, palpitations and leg swelling.  Gastrointestinal:  Negative for abdominal distention and abdominal pain.  Musculoskeletal: Negative.   Neurological:  Positive for headaches. Negative for dizziness, seizures, facial asymmetry, light-headedness and numbness.  Psychiatric/Behavioral:  Negative for agitation, behavioral problems, confusion and decreased concentration.        Objective:    Physical Exam Constitutional:      General: He is not in acute distress.    Appearance: He is not ill-appearing, toxic-appearing or diaphoretic.  HENT:     Nose:     Comments: Has dental caries.    Mouth/Throat:     Mouth: Mucous membranes are moist.     Pharynx: Oropharynx is clear. No oropharyngeal exudate or posterior oropharyngeal erythema.  Eyes:     General: Scleral icterus present.     Extraocular Movements:  Extraocular movements intact.  Cardiovascular:     Rate and Rhythm: Normal rate and regular rhythm.     Pulses: Normal pulses.     Heart sounds: Normal heart sounds. No murmur heard.    No friction rub. No gallop.  Pulmonary:     Effort: Pulmonary effort is normal. No respiratory distress.     Breath sounds: Normal breath sounds. No stridor. No wheezing, rhonchi or rales.  Chest:     Chest wall: No tenderness.  Abdominal:     General: There is no distension.     Palpations: Abdomen is soft.     Tenderness: There is no abdominal tenderness. There is no guarding.  Musculoskeletal:        General: No swelling or tenderness.     Right lower leg: No edema.     Left lower leg: No edema.  Skin:    General: Skin is warm and dry.     Capillary Refill: Capillary refill takes less than 2 seconds.  Neurological:     Mental Status: He is alert and oriented to person, place, and time.     Cranial Nerves: No cranial nerve deficit.     Motor: No weakness.     Coordination: Coordination normal.     Gait: Gait normal.  Psychiatric:        Mood and Affect: Mood normal.        Behavior: Behavior normal.        Thought Content: Thought content normal.        Judgment: Judgment normal.     BP 126/62   Pulse 70   Temp 97.7 F (36.5 C)   Ht _0  (1.854 m)   Wt 136 lb 3.2 oz (61.8 kg)   SpO2 100%   BMI 17.97 kg/m  Wt Readings from Last 3 Encounters:  09/05/22 136 lb 3.2 oz (61.8 kg)  07/08/22 133 lb 12.8 oz (60.7 kg)  01/07/22 134 lb 6.4 oz (61  kg)    No results found for: "TSH" Lab Results  Component Value Date   WBC 3.7 07/08/2022   HGB 6.3 (LL) 07/08/2022   HCT 16.8 (LL) 07/08/2022   MCV 111 (H) 07/08/2022   PLT 230 07/08/2022   Lab Results  Component Value Date   NA 140 07/08/2022   K 4.0 07/08/2022   CO2 16 (L) 07/08/2022   GLUCOSE 132 (H) 07/08/2022   BUN 19 07/08/2022   CREATININE 2.29 (H) 07/08/2022   BILITOT 0.8 07/08/2022   ALKPHOS 150 (H) 07/08/2022   AST 42 (H) 07/08/2022   ALT 28 07/08/2022   PROT 7.0 07/08/2022   ALBUMIN 3.8 (L) 07/08/2022   CALCIUM 8.9 07/08/2022   ANIONGAP 8 10/22/2021   EGFR 35 (L) 07/08/2022   No results found for: "CHOL" No results found for: "HDL" No results found for: "[place]" Lab Results  Component Value Date   TRIG 182 (H) 07/11/2020   No results found for: "CHOLHDL" No results found for: "HGBA1C"    Assessment & Plan:   Problem List Items Addressed This Visit       Digestive   Dental caries - Primary    BP Readings from Last 3 Encounters:  09/05/22 126/62  07/08/22 112/60  01/07/22 114/76  Blood pressure is normal in the office today. Okay for the patient to proceed with his dental filling.      Relevant Medications   sulfamethoxazole-trimethoprim (BACTRIM DS) 800-160 MG tablet     Other  Sarcoidosis     Patient was recently admitted in the hospital for sickle cell crisis on November 20 , 2023 recent CTA was concerning for underlying pulmonary sarcoidosis flare patient was started on prednisone 20 mg for 1 month which will be tapered to 10 mg for 1 month, Bactrim for PJP prophylaxis on Monday Wednesday Friday prescribed patient was encouraged to follow-up with pulmonology in 4 to 6 weeks and to also follow-up with rheumatology.  Patient stated that he has upcoming appointment with pulmonology in January, needs to schedule an appointment with rheumatology.  Need to maintain close follow-up with rheumatology and pulmonology discussed.  Patient was  encouraged to take current medications as ordered.      Need for immunization against influenza    Patient educated on CDC recommendation for the influenza vaccine. Verbal consent was obtained from the patient, vaccine administered by nurse, no sign of adverse reactions noted at this time. Patient education on arm soreness and use of tylenol for this patient  was discussed. Patient educated on the signs and symptoms of adverse effect and advise to contact the office if they occur.       Relevant Orders   Flu Vaccine QUAD 36moIM (Fluarix, Fluzone & Alfiuria Quad PF)   Other Visit Diagnoses     Screening for colon cancer       Relevant Orders   Cologuard       No orders of the defined types were placed in this encounter.   Follow-up: No follow-ups on file.    FRenee Rival FNP

## 2022-09-05 NOTE — Assessment & Plan Note (Signed)
Patient educated on CDC recommendation for the influenza vaccine. Verbal consent was obtained from the patient, vaccine administered by nurse, no sign of adverse reactions noted at this time. Patient education on arm soreness and use of tylenol for this patient  was discussed. Patient educated on the signs and symptoms of adverse effect and advise to contact the office if they occur.  

## 2022-09-07 DIAGNOSIS — I129 Hypertensive chronic kidney disease with stage 1 through stage 4 chronic kidney disease, or unspecified chronic kidney disease: Secondary | ICD-10-CM | POA: Diagnosis not present

## 2022-09-07 DIAGNOSIS — E559 Vitamin D deficiency, unspecified: Secondary | ICD-10-CM | POA: Diagnosis not present

## 2022-09-07 DIAGNOSIS — Z9189 Other specified personal risk factors, not elsewhere classified: Secondary | ICD-10-CM | POA: Diagnosis not present

## 2022-09-07 DIAGNOSIS — Z7964 Long term (current) use of myelosuppressive agent: Secondary | ICD-10-CM | POA: Diagnosis not present

## 2022-09-07 DIAGNOSIS — D869 Sarcoidosis, unspecified: Secondary | ICD-10-CM | POA: Diagnosis not present

## 2022-09-07 DIAGNOSIS — I272 Pulmonary hypertension, unspecified: Secondary | ICD-10-CM | POA: Diagnosis not present

## 2022-09-07 DIAGNOSIS — D571 Sickle-cell disease without crisis: Secondary | ICD-10-CM | POA: Diagnosis not present

## 2022-09-07 DIAGNOSIS — Z79899 Other long term (current) drug therapy: Secondary | ICD-10-CM | POA: Diagnosis not present

## 2022-09-07 DIAGNOSIS — G8929 Other chronic pain: Secondary | ICD-10-CM | POA: Diagnosis not present

## 2022-09-07 DIAGNOSIS — N189 Chronic kidney disease, unspecified: Secondary | ICD-10-CM | POA: Diagnosis not present

## 2022-09-07 DIAGNOSIS — M7918 Myalgia, other site: Secondary | ICD-10-CM | POA: Diagnosis not present

## 2022-09-07 DIAGNOSIS — D5700 Hb-SS disease with crisis, unspecified: Secondary | ICD-10-CM | POA: Diagnosis not present

## 2022-09-07 DIAGNOSIS — D638 Anemia in other chronic diseases classified elsewhere: Secondary | ICD-10-CM | POA: Diagnosis not present

## 2022-09-14 ENCOUNTER — Encounter: Payer: Self-pay | Admitting: Physician Assistant

## 2022-09-22 DIAGNOSIS — D869 Sarcoidosis, unspecified: Secondary | ICD-10-CM | POA: Diagnosis not present

## 2022-09-23 DIAGNOSIS — Z87891 Personal history of nicotine dependence: Secondary | ICD-10-CM | POA: Diagnosis not present

## 2022-09-23 DIAGNOSIS — J984 Other disorders of lung: Secondary | ICD-10-CM | POA: Diagnosis not present

## 2022-09-28 ENCOUNTER — Ambulatory Visit: Payer: Medicaid Other | Admitting: *Deleted

## 2022-09-28 NOTE — Progress Notes (Signed)
Pt came in for b/p recheck--132/79  for Dental procedure release form.

## 2022-10-03 DIAGNOSIS — Z1211 Encounter for screening for malignant neoplasm of colon: Secondary | ICD-10-CM | POA: Diagnosis not present

## 2022-10-06 DIAGNOSIS — D860 Sarcoidosis of lung: Secondary | ICD-10-CM | POA: Diagnosis not present

## 2022-10-06 DIAGNOSIS — Z87891 Personal history of nicotine dependence: Secondary | ICD-10-CM | POA: Diagnosis not present

## 2022-10-06 DIAGNOSIS — D571 Sickle-cell disease without crisis: Secondary | ICD-10-CM | POA: Diagnosis not present

## 2022-10-06 DIAGNOSIS — E559 Vitamin D deficiency, unspecified: Secondary | ICD-10-CM | POA: Diagnosis not present

## 2022-10-10 ENCOUNTER — Ambulatory Visit (INDEPENDENT_AMBULATORY_CARE_PROVIDER_SITE_OTHER): Payer: Medicare HMO | Admitting: Nurse Practitioner

## 2022-10-10 ENCOUNTER — Encounter: Payer: Self-pay | Admitting: Nurse Practitioner

## 2022-10-10 ENCOUNTER — Ambulatory Visit (HOSPITAL_COMMUNITY)
Admission: RE | Admit: 2022-10-10 | Discharge: 2022-10-10 | Disposition: A | Discharge status: Home / Self Care | Payer: Medicare HMO | Source: Ambulatory Visit | Attending: Nurse Practitioner | Admitting: Nurse Practitioner

## 2022-10-10 VITALS — BP 133/71 | HR 66 | Temp 98.1°F | Resp 16 | Ht 70.5 in | Wt 142.4 lb

## 2022-10-10 DIAGNOSIS — R059 Cough, unspecified: Secondary | ICD-10-CM | POA: Diagnosis not present

## 2022-10-10 DIAGNOSIS — J811 Chronic pulmonary edema: Secondary | ICD-10-CM | POA: Diagnosis not present

## 2022-10-10 DIAGNOSIS — D5700 Hb-SS disease with crisis, unspecified: Secondary | ICD-10-CM

## 2022-10-10 DIAGNOSIS — R079 Chest pain, unspecified: Secondary | ICD-10-CM | POA: Insufficient documentation

## 2022-10-10 NOTE — Progress Notes (Signed)
@Patient  ID: , male    DOB: July 11, 1976, 47 y.o.   MRN: [id number]  Chief Complaint  Patient presents with   Follow-up    Thinks he needs BP meds. I goes up and down. Patient states that he gets dizzy, headaches, a little blurry vision and SOB.     Referring provider: [id number], NP   HPI  Shawn Adams 47 y.o. male  has a past medical history of Aneurysm (HCC), Heart murmur, Sarcoid, and Sickle cell anemia (HCC). To the [facility] for abdominal pain.   Patient presents today for a follow-up visit.  He has seen by pulmonary through [facility] for sarcoidosis and he is also seen through hematology through [facility] for sickle cell disease.  Patient was concerned about needing blood pressure medicine but blood pressure is stable in office today. Denies f/c/s, n/v/d, hemoptysis, PND, leg swelling Denies chest pain or edema    No Known Allergies  Immunization History  Administered Date(s) Administered   HIB (PRP-T) 09/11/2013   Influenza,inj,Quad PF,6+ Mos 06/21/2014, 07/08/2015, 07/17/2017, 06/21/2018, 09/30/2019, 07/22/2021, 09/05/2022   Influenza,inj,quad, With Preservative 05/11/2016   Influenza-Unspecified 07/10/2018   Meningococcal B, OMV 11/26/2018, 01/24/2020   Meningococcal Conjugate 09/11/2013, 11/26/2018, 11/26/2018   PFIZER(Purple Top)SARS-COV-2 Vaccination 12/15/2019, 12/30/2019   Pneumococcal Conjugate-13 11/19/2015   Pneumococcal Polysaccharide-23 10/02/2009, 10/23/2017   Tdap 07/08/2015   Unspecified SARS-COV-2 Vaccination 12/15/2019, 12/30/2019    Past Medical History:  Diagnosis Date   Aneurysm (HCC)    Pt states happened in 2011, Brain    Asthma    CKD (chronic kidney disease) stage 3, GFR 30-59 ml/min (HCC)    Heart murmur    Pulmonary sarcoidosis (HCC)    Sarcoid    Sickle cell anemia (HCC)     Tobacco History: Social History   Tobacco Use  Smoking Status Former   Types: Cigarettes  Smokeless Tobacco Never   Counseling  given: Not Answered   Outpatient Encounter Medications as of 10/10/2022  Medication Sig   acetaminophen (TYLENOL) 500 MG tablet Take 500 mg by mouth every 6 (six) hours as needed for moderate pain or fever (pain).    albuterol (VENTOLIN HFA) 108 (90 Base) MCG/ACT inhaler Inhale into the lungs.   Ascorbic Acid (VITAMIN C) 1000 MG tablet Take 1,000 mg by mouth daily.   budesonide-formoterol (SYMBICORT) 160-4.5 MCG/ACT inhaler Inhale 2 puffs into the lungs 2 (two) times daily.   cyanocobalamin 50 MCG tablet Take 100 mcg by mouth daily.   cyclobenzaprine (FLEXERIL) 10 MG tablet Take 1 tablet (10 mg total) by mouth 3 (three) times daily as needed for muscle spasms.   docusate sodium (COLACE) 100 MG capsule Take 100 mg by mouth daily as needed for mild constipation.   ergocalciferol (VITAMIN D2) 1.25 MG (50000 UT) capsule Take 50,000 Units by mouth every 30 (thirty) days.    folic acid (FOLVITE) 1 MG tablet Take 1 tablet (1 mg total) by mouth daily.   hydroxychloroquine (PLAQUENIL) 200 MG tablet Take 200 mg by mouth daily.   hydroxyurea (HYDREA) 500 MG capsule Take 2 capsules (1,000 mg total) by mouth daily. May take with food to minimize GI side effects.   INCRUSE ELLIPTA 62.5 MCG/INH AEPB Inhale 1 puff into the lungs daily as needed (for shortness of breath).   Melatonin 10 MG TABS Take 10 mg by mouth at bedtime.   ondansetron (ZOFRAN) 4 MG tablet Take by mouth.   OXBRYTA 500 MG TABS tablet Take 1,500 mg by mouth  at bedtime.   Oxycodone HCl 20 MG TABS Take by mouth.   pantoprazole (PROTONIX) 40 MG tablet Take 1 tablet (40 mg total) by mouth daily.   predniSONE (DELTASONE) 10 MG tablet Take 2 tablets (20 mg) by mouth daily for 1 month (11/24 - 12/24) then decrease to 1 tablet (10 mg) by mouth daily until follow up with pulmonology   sodium bicarbonate 650 MG tablet Take 650 mg by mouth daily.   SYMBICORT 80-4.5 MCG/ACT inhaler Inhale 2 puffs into the lungs daily as needed (for shortness of breath).    [DISCONTINUED] sulfamethoxazole-trimethoprim (BACTRIM DS) 800-160 MG tablet Take by mouth.   No facility-administered encounter medications on file as of 10/10/2022.     Review of Systems  Review of Systems  Constitutional: Negative.   HENT: Negative.    Cardiovascular: Negative.   Gastrointestinal: Negative.   Allergic/Immunologic: Negative.   Neurological: Negative.   Psychiatric/Behavioral: Negative.         Physical Exam  BP 133/71   Pulse 66   Temp 98.1 F (36.7 C) (Temporal)   Resp 16   Ht 5' 10.5" (1.791 m)   Wt 142 lb 6.4 oz (64.6 kg)   SpO2 100%   BMI 20.14 kg/m   Wt Readings from Last 5 Encounters:  10/10/22 142 lb 6.4 oz (64.6 kg)  09/05/22 136 lb 3.2 oz (61.8 kg)  07/08/22 133 lb 12.8 oz (60.7 kg)  01/07/22 134 lb 6.4 oz (61 kg)  10/16/21 135 lb (61.2 kg)     Physical Exam Vitals and nursing note reviewed.  Constitutional:      General: He is not in acute distress.    Appearance: He is well-developed.  Cardiovascular:     Rate and Rhythm: Normal rate and regular rhythm.  Pulmonary:     Effort: Pulmonary effort is normal.     Breath sounds: Normal breath sounds.  Skin:    General: Skin is warm and dry.  Neurological:     Mental Status: He is alert and oriented to person, place, and time.      Lab Results:  CBC    Component Value Date/Time   WBC 3.7 07/08/2022 1130   WBC 5.8 10/22/2021 1033   RBC 1.51 (LL) 07/08/2022 1130   RBC 2.16 (L) 10/22/2021 1033   HGB 6.3 (LL) 07/08/2022 1130   HCT 16.8 (LL) 07/08/2022 1130   PLT 230 07/08/2022 1130   MCV 111 (H) 07/08/2022 1130   MCH 41.7 (H) 07/08/2022 1130   MCH 34.3 (H) 10/22/2021 1033   MCHC 37.5 (H) 07/08/2022 1130   MCHC 35.7 10/22/2021 1033   RDW 17.1 (H) 07/08/2022 1130   LYMPHSABS 1.3 07/08/2022 1130   MONOABS 0.5 10/18/2021 0405   EOSABS 0.3 07/08/2022 1130   BASOSABS 0.0 07/08/2022 1130    BMET    Component Value Date/Time   NA 140 07/08/2022 1130   K 4.0 07/08/2022  1130   CL 109 (H) 07/08/2022 1130   CO2 16 (L) 07/08/2022 1130   GLUCOSE 132 (H) 07/08/2022 1130   GLUCOSE 107 (H) 10/22/2021 1033   BUN 19 07/08/2022 1130   CREATININE 2.29 (H) 07/08/2022 1130   CREATININE 1.39 (H) 05/11/2016 1100   CALCIUM 8.9 07/08/2022 1130   GFRNONAA 35 (L) 10/22/2021 1033   GFRNONAA 63 05/11/2016 1100   GFRAA 18 (L) 04/12/2020 0641   GFRAA 73 05/11/2016 1100      Assessment & Plan:   Sickle cell anemia (HCC) - Sickle Cell  Panel - Drug Screen 11 w/Conf, Ser  2. Chest pain, unspecified type  - DG Chest 2 View  Follow up:  Follow up in 3 months     Fenton Foy, NP 10/10/2022

## 2022-10-10 NOTE — Patient Instructions (Signed)
1. Sickle cell disease with crisis ([place])  - Sickle Cell Panel - Drug Screen 11 w/Conf, Ser  2. Chest pain, unspecified type  - DG Chest 2 View  Follow up:  Follow up in 3 months

## 2022-10-10 NOTE — Assessment & Plan Note (Signed)
-  Sickle Cell Panel - Drug Screen 11 w/Conf, Ser  2. Chest pain, unspecified type  - DG Chest 2 View  Follow up:  Follow up in 3 months

## 2022-10-11 LAB — CMP14+CBC/D/PLT+FER+RETIC+V...
ALT: 23 IU/L (ref 0–44)
AST: 25 IU/L (ref 0–40)
Albumin/Globulin Ratio: 1.4 (ref 1.2–2.2)
Albumin: 3.9 g/dL — ABNORMAL LOW (ref 4.1–5.1)
Alkaline Phosphatase: 98 IU/L (ref 44–121)
BUN/Creatinine Ratio: 13 (ref 9–20)
BUN: 25 mg/dL — ABNORMAL HIGH (ref 6–24)
Basophils Absolute: 0 10*3/uL (ref 0.0–0.2)
Basos: 1 %
Bilirubin Total: 0.6 mg/dL (ref 0.0–1.2)
CO2: 15 mmol/L — ABNORMAL LOW (ref 20–29)
Calcium: 8.8 mg/dL (ref 8.7–10.2)
Chloride: 109 mmol/L — ABNORMAL HIGH (ref 96–106)
Creatinine, Ser: 1.94 mg/dL — ABNORMAL HIGH (ref 0.76–1.27)
EOS (ABSOLUTE): 0.1 10*3/uL (ref 0.0–0.4)
Eos: 3 %
Ferritin: 2762 ng/mL — ABNORMAL HIGH (ref 30–400)
Globulin, Total: 2.8 g/dL (ref 1.5–4.5)
Glucose: 107 mg/dL — ABNORMAL HIGH (ref 70–99)
Hematocrit: 23.5 % — ABNORMAL LOW (ref 37.5–51.0)
Hemoglobin: 8.8 g/dL — ABNORMAL LOW (ref 13.0–17.7)
Immature Grans (Abs): 0.1 10*3/uL (ref 0.0–0.1)
Immature Granulocytes: 2 %
Lymphocytes Absolute: 1.3 10*3/uL (ref 0.7–3.1)
Lymphs: 29 %
MCH: 38.9 pg — ABNORMAL HIGH (ref 26.6–33.0)
MCHC: 37.4 g/dL — ABNORMAL HIGH (ref 31.5–35.7)
MCV: 104 fL — ABNORMAL HIGH (ref 79–97)
Monocytes Absolute: 0.4 10*3/uL (ref 0.1–0.9)
Monocytes: 10 %
NRBC: 20 % — ABNORMAL HIGH (ref 0–0)
Neutrophils Absolute: 2.5 10*3/uL (ref 1.4–7.0)
Neutrophils: 55 %
Platelets: 378 10*3/uL (ref 150–450)
Potassium: 4 mmol/L (ref 3.5–5.2)
RBC: 2.26 x10E6/uL — CL (ref 4.14–5.80)
RDW: 20.8 % — ABNORMAL HIGH (ref 11.6–15.4)
Retic Ct Pct: 8.4 % — ABNORMAL HIGH (ref 0.6–2.6)
Sodium: 140 mmol/L (ref 134–144)
Total Protein: 6.7 g/dL (ref 6.0–8.5)
Vit D, 25-Hydroxy: 9.1 ng/mL — ABNORMAL LOW (ref 30.0–100.0)
WBC: 4.4 10*3/uL (ref 3.4–10.8)
eGFR: 42 mL/min/{1.73_m2} — ABNORMAL LOW (ref >59)

## 2022-10-12 ENCOUNTER — Other Ambulatory Visit: Payer: Self-pay | Admitting: Nurse Practitioner

## 2022-10-12 ENCOUNTER — Ambulatory Visit (INDEPENDENT_AMBULATORY_CARE_PROVIDER_SITE_OTHER): Payer: Medicare HMO | Admitting: Physician Assistant

## 2022-10-12 ENCOUNTER — Other Ambulatory Visit (INDEPENDENT_AMBULATORY_CARE_PROVIDER_SITE_OTHER): Payer: Medicare HMO

## 2022-10-12 ENCOUNTER — Encounter: Payer: Self-pay | Admitting: Physician Assistant

## 2022-10-12 VITALS — BP 150/70 | HR 74 | Ht 72.0 in | Wt 141.4 lb

## 2022-10-12 DIAGNOSIS — D869 Sarcoidosis, unspecified: Secondary | ICD-10-CM

## 2022-10-12 DIAGNOSIS — K921 Melena: Secondary | ICD-10-CM | POA: Diagnosis not present

## 2022-10-12 DIAGNOSIS — D649 Anemia, unspecified: Secondary | ICD-10-CM | POA: Diagnosis not present

## 2022-10-12 DIAGNOSIS — K219 Gastro-esophageal reflux disease without esophagitis: Secondary | ICD-10-CM

## 2022-10-12 DIAGNOSIS — D5700 Hb-SS disease with crisis, unspecified: Secondary | ICD-10-CM | POA: Diagnosis not present

## 2022-10-12 DIAGNOSIS — D638 Anemia in other chronic diseases classified elsewhere: Secondary | ICD-10-CM

## 2022-10-12 DIAGNOSIS — Z1211 Encounter for screening for malignant neoplasm of colon: Secondary | ICD-10-CM | POA: Diagnosis not present

## 2022-10-12 DIAGNOSIS — R195 Other fecal abnormalities: Secondary | ICD-10-CM | POA: Diagnosis not present

## 2022-10-12 LAB — CBC WITH DIFFERENTIAL/PLATELET
Basophils Absolute: 0.1 10*3/uL (ref 0.0–0.1)
Basophils Relative: 1.1 % (ref 0.0–3.0)
Eosinophils Absolute: 0.1 10*3/uL (ref 0.0–0.7)
Eosinophils Relative: 0.9 % (ref 0.0–5.0)
HCT: 27.1 % — ABNORMAL LOW (ref 39.0–52.0)
Hemoglobin: 9.6 g/dL — ABNORMAL LOW (ref 13.0–17.0)
Lymphocytes Relative: 18.5 % (ref 12.0–46.0)
Lymphs Abs: 1.5 10*3/uL (ref 0.7–4.0)
MCHC: 35.6 g/dL (ref 30.0–36.0)
MCV: 109.3 fl — ABNORMAL HIGH (ref 78.0–100.0)
Monocytes Absolute: 0.5 10*3/uL (ref 0.1–1.0)
Monocytes Relative: 6.3 % (ref 3.0–12.0)
Neutro Abs: 6.1 10*3/uL (ref 1.4–7.7)
Neutrophils Relative %: 73.2 % (ref 43.0–77.0)
Platelets: 370 10*3/uL (ref 150.0–400.0)
RBC: 2.52 Mil/uL — ABNORMAL LOW (ref 4.22–5.81)
RDW: 26.5 % — ABNORMAL HIGH (ref 11.5–15.5)
WBC: 8.3 10*3/uL (ref 4.0–10.5)

## 2022-10-12 LAB — HIGH SENSITIVITY CRP: CRP, High Sensitivity: 6.9 mg/L — ABNORMAL HIGH (ref 0.000–5.000)

## 2022-10-12 LAB — COMPREHENSIVE METABOLIC PANEL
ALT: 27 U/L (ref 0–53)
AST: 27 U/L (ref 0–37)
Albumin: 3.9 g/dL (ref 3.5–5.2)
Alkaline Phosphatase: 91 U/L (ref 39–117)
BUN: 22 mg/dL (ref 6–23)
CO2: 22 mEq/L (ref 19–32)
Calcium: 8.7 mg/dL (ref 8.4–10.5)
Chloride: 110 mEq/L (ref 96–112)
Creatinine, Ser: 2.02 mg/dL — ABNORMAL HIGH (ref 0.40–1.50)
GFR: 38.89 mL/min — ABNORMAL LOW (ref >60.00)
Glucose, Bld: 94 mg/dL (ref 70–99)
Potassium: 3.4 mEq/L — ABNORMAL LOW (ref 3.5–5.1)
Sodium: 140 mEq/L (ref 135–145)
Total Bilirubin: 0.8 mg/dL (ref 0.2–1.2)
Total Protein: 7.2 g/dL (ref 6.0–8.3)

## 2022-10-12 LAB — SEDIMENTATION RATE: Sed Rate: 8 mm/hr (ref 0–15)

## 2022-10-12 MED ORDER — SUTAB 1479-225-188 MG PO TABS: 1.0000 | ORAL_TABLET | Freq: Once | ORAL | 0 refills | Status: AC

## 2022-10-12 MED ORDER — VITAMIN D (ERGOCALCIFEROL) 1.25 MG (50000 UNIT) PO CAPS: 50000.0000 [IU] | ORAL_CAPSULE | ORAL | 0 refills | Status: DC

## 2022-10-12 NOTE — Progress Notes (Signed)
10/12/2022 Shawn Adams [id number] January 14, 1976  Referring provider: Ivonne Andrew, NP Primary GI doctor: Dr. Rhea Belton  ASSESSMENT AND PLAN:   Screen for colon cancer some loose stools, no hematochezia We have discussed the risks of bleeding, infection, perforation, medication reactions, and remote risk of death associated with colonoscopy. All questions were answered and the patient acknowledges these risk and wishes to proceed. Add on fiber  Melena with GERD, on prednisone PRN for sarcoidosis Lifestyle changes discussed, avoid NSAIDS, ETOH Will increase PPI to twice daily, emphasizing before food., Will schedule for EGD. , and Will get RUQ [place] and consider HIDA.  Will schedule EGD to evaluate for possible H. pylori, esophagitis, gastritis, peptic ulcer disease, etc.. I discussed risks of EGD with patient today, including risk of sedation, bleeding or perforation.  Patient provides understanding and gave verbal consent to proceed.  Intermittent RUQ pain, cholelithiasis on [place] 2021 with liver lesion Check LFTs, get RUQ [place] HIDA if EGD negative  Symptomatic anemia with CKD stage 3, anemia of chronic disease Normal Iron/Ferritin  Sickle cell disease with crisis (HCC) Recent sickle cell crisis in Nov Try to not become dehydrated, possible can be contributing to some of his GI symptoms  Sarcoidosis Not on oxygen, on prednisone 10 mg daily  Should be able to be done at [facility]   Patient Care Team: Ivonne Andrew, NP as PCP - General (Pulmonary Disease)  HISTORY OF PRESENT ILLNESS: 47 y.o. male with a past medical history of brain aneurysm 2011, sarcoidosis x 2013 with pulmonary HTN on plaquenil every other day follows with [facility] Dr. Rosita Fire, sickle cell anemia, CKD stage III, heart murmur, and others listed below presents for evaluation of abdominal pain and diarrhea.   03/2020 RUQ [place] Cholelithiasis without sonographic evidence of acute cholecystitis. 13 mm hypoechoic lesion  within the right hepatic lobe,indeterminate. This could be better assessed with contrast enhanced MRI examination if clinically indicated. Alternatively, a follow-up sonogram in 6 months may be helpful to document stability. 03/2020 CT AB and pelvis without contrast showed gallstones, no hepatomegaly, bony changes sickle cell Cologuard in process. 09/07/2022 labs reviewed Hgb 9, MCV 109, platelets 292, WBC 4.6, BUN 39, creatinine 2.38, normal liver function, iron 212, ferritin thousand 448, percent saturation 96, transferrin 158.  Last visit with Dr. Rosita Fire was 10/06/2022 after recent admission 07/2022 for sickle cell crisis, had increase medistinal hilar lymph node size and hypoxemia, started on prednisone 20 mg, tapered down to 10 mg daily.  He did require O2 during the hospitalization but off at this time.  Family history of colon cancer on his dad side, no specific ages.  Remote smoking history, no ETOH, no drugs, no NSAIDS.   He states he has had gallstone issues for 3-4 years.  He will feel upper AB pain with occ loose stools or gas, then drinking water helps. Can happen every other week. No associated N, V.  He has GERD, no dysphagia. He has nausea intermittent but no vomiting.  Can have chest, right upper, back pain can last hours, tylenol or oxycodone, states has had for years since diagnosed sarcoidosis, states it is from lymph nose swelling.  He has had dark shiny stools very rare, last time was a month ago, denies iron or pepto use.  He is on protonix 40 mg takes at night during 3rd shift, takes around food but not always.  Has BM daily, can be mushy, no hematochezia. .  No family history of GB issues.  He states last year he had loose stools, had fecal incontinence, had   He  reports that he has quit smoking. His smoking use included cigarettes. He has never used smokeless tobacco. He reports that he does not currently use alcohol after a past usage of about 1.0 standard drink of  alcohol per week. He reports that he does not currently use drugs after having used the following drugs: Marijuana.  Current Medications:   Current Outpatient Medications (Endocrine & Metabolic):    predniSONE (DELTASONE) 10 MG tablet, Take 2 tablets (20 mg) by mouth daily for 1 month (11/24 - 12/24) then decrease to 1 tablet (10 mg) by mouth daily until follow up with pulmonology   Current Outpatient Medications (Respiratory):    INCRUSE ELLIPTA 62.5 MCG/INH AEPB, Inhale 1 puff into the lungs daily as needed (for shortness of breath).   SYMBICORT 80-4.5 MCG/ACT inhaler, Inhale 2 puffs into the lungs daily as needed (for shortness of breath).   albuterol (VENTOLIN HFA) 108 (90 Base) MCG/ACT inhaler, Inhale into the lungs.   budesonide-formoterol (SYMBICORT) 160-4.5 MCG/ACT inhaler, Inhale 2 puffs into the lungs 2 (two) times daily.  Current Outpatient Medications (Analgesics):    acetaminophen (TYLENOL) 500 MG tablet, Take 500 mg by mouth every 6 (six) hours as needed for moderate pain or fever (pain).    Oxycodone HCl 20 MG TABS, Take by mouth.  Current Outpatient Medications (Hematological):    cyanocobalamin 50 MCG tablet, Take 100 mcg by mouth daily.   folic acid (FOLVITE) 1 MG tablet, Take 1 tablet (1 mg total) by mouth daily.   OXBRYTA 500 MG TABS tablet, Take 1,500 mg by mouth at bedtime.  Current Outpatient Medications (Other):    Ascorbic Acid (VITAMIN C) 1000 MG tablet, Take 1,000 mg by mouth daily.   cyclobenzaprine (FLEXERIL) 10 MG tablet, Take 1 tablet (10 mg total) by mouth 3 (three) times daily as needed for muscle spasms.   docusate sodium (COLACE) 100 MG capsule, Take 100 mg by mouth daily as needed for mild constipation.   ergocalciferol (VITAMIN D2) 1.25 MG (50000 UT) capsule, Take 50,000 Units by mouth every 30 (thirty) days.    hydroxychloroquine (PLAQUENIL) 200 MG tablet, Take 200 mg by mouth daily.   hydroxyurea (HYDREA) 500 MG capsule, Take 2 capsules (1,000 mg  total) by mouth daily. May take with food to minimize GI side effects.   Melatonin 10 MG TABS, Take 10 mg by mouth at bedtime.   ondansetron (ZOFRAN) 4 MG tablet, Take by mouth.   pantoprazole (PROTONIX) 40 MG tablet, Take 1 tablet (40 mg total) by mouth daily.   sodium bicarbonate 650 MG tablet, Take 650 mg by mouth daily.   Vitamin D, Ergocalciferol, (DRISDOL) 1.25 MG (50000 UNIT) CAPS capsule, Take 1 capsule (50,000 Units total) by mouth every 7 (seven) days.  Medical History:  Past Medical History:  Diagnosis Date   Aneurysm ([place])    Pt states happened in 2011, Brain    Asthma    CKD (chronic kidney disease) stage 3, GFR 30-59 ml/min (HCC)    Heart murmur    Pulmonary sarcoidosis (HCC)    Sarcoid    Sickle cell anemia (HCC)    Allergies: No Known Allergies   Surgical History:  He  has a past surgical history that includes Lymph node biopsy and Nasal sinus surgery. Family History:  His family history includes Diabetes in his mother; Hypertension in his father; Lupus in his sister.  REVIEW OF SYSTEMS  :  All other systems reviewed and negative except where noted in the History of Present Illness.  PHYSICAL EXAM: BP (!) 150/70   Pulse 74   Ht 6' (1.829 m)   Wt 141 lb 6 oz (64.1 kg)   BMI 19.17 kg/m  General:   Pleasant, thin appearing male in no acute distress Head:   Normocephalic and atraumatic. Eyes:  sclerae anicteric,conjunctive pink  Heart:   regular rate and rhythm Pulm:  Clear anteriorly; no wheezing Abdomen:   Soft, Flat AB, Active bowel sounds. mild tenderness in the epigastrium. Without guarding and Without rebound, No organomegaly appreciated. Rectal: Not evaluated Extremities:  Without edema. Msk: Symmetrical without gross deformities. Peripheral pulses intact.  Neurologic:  Alert and  oriented x4;  No focal deficits.  Skin:   Dry and intact without significant lesions or rashes. Psychiatric:  Cooperative. Normal mood and affect.  RELEVANT LABS AND  IMAGING: CBC    Component Value Date/Time   WBC 4.4 10/10/2022 1342   WBC 5.8 10/22/2021 1033   RBC 2.26 (LL) 10/10/2022 1342   RBC 2.16 (L) 10/22/2021 1033   HGB 8.8 (L) 10/10/2022 1342   HCT 23.5 (L) 10/10/2022 1342   PLT 378 10/10/2022 1342   MCV 104 (H) 10/10/2022 1342   MCH 38.9 (H) 10/10/2022 1342   MCH 34.3 (H) 10/22/2021 1033   MCHC 37.4 (H) 10/10/2022 1342   MCHC 35.7 10/22/2021 1033   RDW 20.8 (H) 10/10/2022 1342   LYMPHSABS 1.3 10/10/2022 1342   MONOABS 0.5 10/18/2021 0405   EOSABS 0.1 10/10/2022 1342   BASOSABS 0.0 10/10/2022 1342    CMP     Component Value Date/Time   NA 140 10/10/2022 1342   K 4.0 10/10/2022 1342   CL 109 (H) 10/10/2022 1342   CO2 15 (L) 10/10/2022 1342   GLUCOSE 107 (H) 10/10/2022 1342   GLUCOSE 107 (H) 10/22/2021 1033   BUN 25 (H) 10/10/2022 1342   CREATININE 1.94 (H) 10/10/2022 1342   CREATININE 1.39 (H) 05/11/2016 1100   CALCIUM 8.8 10/10/2022 1342   PROT 6.7 10/10/2022 1342   ALBUMIN 3.9 (L) 10/10/2022 1342   AST 25 10/10/2022 1342   ALT 23 10/10/2022 1342   ALKPHOS 98 10/10/2022 1342   BILITOT 0.6 10/10/2022 1342   GFRNONAA 35 (L) 10/22/2021 1033   GFRNONAA 63 05/11/2016 1100   GFRAA 18 (L) 04/12/2020 0641   GFRAA 73 05/11/2016 [place] Trulee Hamstra, PA-C 2:45 PM

## 2022-10-12 NOTE — Patient Instructions (Addendum)
You have been scheduled for an endoscopy and colonoscopy. Please follow the written instructions given to you at your visit today. Please pick up your prep supplies at the pharmacy within the next 1-3 days. If you use inhalers (even only as needed), please bring them with you on the day of your procedure.    Your provider has requested that you go to the basement level for lab work before leaving today. Press "B" on the elevator. The lab is located at the first door on the left as you exit the elevator.  You have been scheduled for an abdominal ultrasound at [facility] (1st floor of hospital) on Tuesday, 10-18-22 at 9:00am. Please arrive 30 minutes prior to your appointment for registration. Make certain not to have anything to eat or drink 6 hours prior to your appointment. Should you need to reschedule your appointment, please contact radiology at [phone]. This test typically takes about 30 minutes to perform.    Please take your proton pump inhibitor medication,pantoprazole 40mg  Please take this medication 30 minutes to 1 hour before meals- this makes it more effective.  Avoid spicy and acidic foods Avoid fatty foods Limit your intake of coffee, tea, alcohol, and carbonated drinks Work to maintain a healthy weight Keep the head of the bed elevated at least 3 inches with blocks or a wedge pillow if you are having any nighttime symptoms Stay upright for 2 hours after eating Avoid meals and snacks three to four hours before bedtime   FIBER SUPPLEMENT You can do metamucil or fibercon once or twice a day but if this causes gas/bloating please switch to Benefiber or Citracel.  Fiber is good for constipation/diarrhea/irritable bowel syndrome.  It can also help with weight loss and can help lower your bad cholesterol (LDL).  Please do 1 TBSP in the morning in water, coffee, or tea.  It can take up to a month before you can see a difference with your bowel movements.  It is  cheapest from [employer], [employer], [employer].   First do a trial off milk/lactose products if you use them.  Add fiber like benefiber or citracel once a day Increase activity Can do trial of IBGard which is over the counter for AB pain- Take 1-2 capsules once a day for maintence or twice a day during a flare Please try to decrease stress. consider talking with PCP about anti anxiety medication or try head space app for meditation. if any worsening symptoms like blood in stool, weight loss, please call the office   Please try low FODMAP diet- see below- start with eliminating just one column at a time, the table at the very bottom contains foods that are safe to take   FODMAP stands for fermentable oligo-, di-, mono-saccharides and polyols (1). These are the scientific terms used to classify groups of carbs that are notorious for triggering digestive symptoms like bloating, gas and stomach pain.

## 2022-10-14 ENCOUNTER — Telehealth (HOSPITAL_COMMUNITY): Payer: Self-pay | Admitting: Nurse Practitioner

## 2022-10-14 LAB — COLOGUARD: COLOGUARD: NEGATIVE

## 2022-10-14 NOTE — Telephone Encounter (Signed)
Left message for patient to call back and schedule Medicare Annual Wellness Visit (AWV) in office.   If not able to come in office, please offer to do virtually or by telephone.  Left office number and my jabber [phone].  AWVI eligible as of 09/27/2011  Please schedule at anytime with Nurse Health Advisor.

## 2022-10-14 NOTE — Progress Notes (Signed)
Addendum: Reviewed and agree with assessment and management plan. Landi Biscardi M, MD  

## 2022-10-14 NOTE — Progress Notes (Signed)
Normal test, repeat in 3 years

## 2022-10-18 ENCOUNTER — Ambulatory Visit (HOSPITAL_COMMUNITY)
Admission: RE | Admit: 2022-10-18 | Discharge: 2022-10-18 | Disposition: A | Discharge status: Home / Self Care | Payer: Medicare HMO | Source: Ambulatory Visit | Attending: Physician Assistant | Admitting: Physician Assistant

## 2022-10-18 DIAGNOSIS — K921 Melena: Secondary | ICD-10-CM | POA: Diagnosis not present

## 2022-10-18 DIAGNOSIS — R195 Other fecal abnormalities: Secondary | ICD-10-CM | POA: Diagnosis not present

## 2022-10-18 DIAGNOSIS — R109 Unspecified abdominal pain: Secondary | ICD-10-CM | POA: Diagnosis not present

## 2022-10-20 DIAGNOSIS — R918 Other nonspecific abnormal finding of lung field: Secondary | ICD-10-CM | POA: Diagnosis not present

## 2022-10-20 DIAGNOSIS — R591 Generalized enlarged lymph nodes: Secondary | ICD-10-CM | POA: Diagnosis not present

## 2022-10-20 DIAGNOSIS — D571 Sickle-cell disease without crisis: Secondary | ICD-10-CM | POA: Diagnosis not present

## 2022-10-20 DIAGNOSIS — R590 Localized enlarged lymph nodes: Secondary | ICD-10-CM | POA: Diagnosis not present

## 2022-10-22 ENCOUNTER — Other Ambulatory Visit: Payer: Self-pay | Admitting: Nurse Practitioner

## 2022-10-24 NOTE — Telephone Encounter (Signed)
Caller & Relationship to patient:  MRN #  [id number]   Call Back Number:   Date of Last Office Visit: 10/10/2022     Date of Next Office Visit: 01/09/2023    Medication(s) to be Refilled: vitamin D   Preferred Pharmacy: [email or web address]  ** Please notify patient to allow 48-72 hours to process** **Let patient know to contact pharmacy at the end of the day to make sure medication is ready. ** **If patient has not been seen in a year or longer, book an appointment **Advise to use MyChart for refill requests OR to contact their pharmacy

## 2022-11-04 DIAGNOSIS — I725 Aneurysm of other precerebral arteries: Secondary | ICD-10-CM | POA: Diagnosis not present

## 2022-11-04 DIAGNOSIS — I671 Cerebral aneurysm, nonruptured: Secondary | ICD-10-CM | POA: Diagnosis not present

## 2022-11-07 ENCOUNTER — Telehealth: Payer: Self-pay | Admitting: Nurse Practitioner

## 2022-11-07 NOTE — Telephone Encounter (Signed)
Left message for patient to call back and schedule Medicare Annual Wellness Visit (AWV) in office.   If not able to come in office, please offer to do virtually or by telephone.  Left office number and my jabber [phone].  AWVI eligible as of 09/27/2011  Please schedule at anytime with Nurse Health Advisor.

## 2022-11-15 DIAGNOSIS — D638 Anemia in other chronic diseases classified elsewhere: Secondary | ICD-10-CM | POA: Diagnosis not present

## 2022-11-15 DIAGNOSIS — I671 Cerebral aneurysm, nonruptured: Secondary | ICD-10-CM | POA: Diagnosis not present

## 2022-11-15 DIAGNOSIS — N1832 Chronic kidney disease, stage 3b: Secondary | ICD-10-CM | POA: Diagnosis not present

## 2022-11-15 DIAGNOSIS — D860 Sarcoidosis of lung: Secondary | ICD-10-CM | POA: Diagnosis not present

## 2022-11-15 DIAGNOSIS — R809 Proteinuria, unspecified: Secondary | ICD-10-CM | POA: Diagnosis not present

## 2022-11-15 DIAGNOSIS — N529 Male erectile dysfunction, unspecified: Secondary | ICD-10-CM | POA: Diagnosis not present

## 2022-11-15 DIAGNOSIS — E8809 Other disorders of plasma-protein metabolism, not elsewhere classified: Secondary | ICD-10-CM | POA: Diagnosis not present

## 2022-11-15 DIAGNOSIS — J45909 Unspecified asthma, uncomplicated: Secondary | ICD-10-CM | POA: Diagnosis not present

## 2022-11-15 DIAGNOSIS — D571 Sickle-cell disease without crisis: Secondary | ICD-10-CM | POA: Diagnosis not present

## 2022-11-24 ENCOUNTER — Ambulatory Visit: Payer: Medicare HMO | Admitting: Internal Medicine

## 2022-11-24 ENCOUNTER — Encounter: Payer: Self-pay | Admitting: Internal Medicine

## 2022-11-24 ENCOUNTER — Telehealth: Payer: Self-pay | Admitting: Internal Medicine

## 2022-11-24 VITALS — BP 123/80 | HR 71 | Temp 99.5°F | Resp 15 | Ht 72.0 in | Wt 141.0 lb

## 2022-11-24 DIAGNOSIS — K2971 Gastritis, unspecified, with bleeding: Secondary | ICD-10-CM | POA: Diagnosis not present

## 2022-11-24 DIAGNOSIS — N183 Chronic kidney disease, stage 3 unspecified: Secondary | ICD-10-CM | POA: Diagnosis not present

## 2022-11-24 DIAGNOSIS — K219 Gastro-esophageal reflux disease without esophagitis: Secondary | ICD-10-CM

## 2022-11-24 DIAGNOSIS — R1013 Epigastric pain: Secondary | ICD-10-CM | POA: Diagnosis not present

## 2022-11-24 DIAGNOSIS — K2970 Gastritis, unspecified, without bleeding: Secondary | ICD-10-CM

## 2022-11-24 DIAGNOSIS — Z1211 Encounter for screening for malignant neoplasm of colon: Secondary | ICD-10-CM | POA: Diagnosis not present

## 2022-11-24 DIAGNOSIS — K921 Melena: Secondary | ICD-10-CM | POA: Diagnosis not present

## 2022-11-24 DIAGNOSIS — K254 Chronic or unspecified gastric ulcer with hemorrhage: Secondary | ICD-10-CM | POA: Diagnosis not present

## 2022-11-24 MED ORDER — PANTOPRAZOLE SODIUM 40 MG PO TBEC: 40.0000 mg | DELAYED_RELEASE_TABLET | Freq: Every day | ORAL | 3 refills | Status: DC

## 2022-11-24 MED ORDER — SODIUM CHLORIDE 0.9 % IV SOLN: 500.0000 mL | Freq: Once | INTRAVENOUS | Status: DC

## 2022-11-24 NOTE — Telephone Encounter (Signed)
Sent work note to pt via Pharmacist, community. Called pt to let him know and he states he is able to access his MyChart to view the letter. No other concerns at this time.

## 2022-11-24 NOTE — Op Note (Signed)
[place] Patient Name: Shawn Adams Procedure Date: 11/24/2022 2:43 PM MRN: [id number] Endoscopist: Jerene Bears , MD, [id number] Age: 47 Referring MD:  Date of Birth: 03/16/1976 Gender: Male Account #: [id number] Procedure:                Upper GI endoscopy Indications:              Melena Medicines:                Monitored Anesthesia Care Procedure:                Pre-Anesthesia Assessment:                           - Prior to the procedure, a History and Physical                            was performed, and patient medications and                            allergies were reviewed. The patient's tolerance of                            previous anesthesia was also reviewed. The risks                            and benefits of the procedure and the sedation                            options and risks were discussed with the patient.                            All questions were answered, and informed consent                            was obtained. Prior Anticoagulants: The patient has                            taken no anticoagulant or antiplatelet agents. ASA                            Grade Assessment: III - A patient with severe                            systemic disease. After reviewing the risks and                            benefits, the patient was deemed in satisfactory                            condition to undergo the procedure.                           After obtaining informed consent, the endoscope was  passed under direct vision. Throughout the                            procedure, the patient's blood pressure, pulse, and                            oxygen saturations were monitored continuously. The                            Olympus Scope [id number] was introduced through the                            mouth, and advanced to the second part of duodenum.                            The upper GI endoscopy was accomplished  without                            difficulty. The patient tolerated the procedure                            well. Scope In: Scope Out: Findings:                 The examined esophagus was normal.                           Moderate inflammation with hemorrhage characterized                            by adherent blood, erythema and friability was                            found in the gastric antrum. Biopsies were taken                            with a cold forceps for histology and Helicobacter                            pylori testing.                           The examined duodenum was normal. Complications:            No immediate complications. Estimated Blood Loss:     Estimated blood loss was minimal. Impression:               - Normal esophagus.                           - Acute gastritis with minor oozing. Biopsied.                           - Normal examined duodenum. Recommendation:           - Patient has a contact number available for  emergencies. The signs and symptoms of potential                            delayed complications were discussed with the                            patient. Return to normal activities tomorrow.                            Written discharge instructions were provided to the                            patient.                           - Resume previous diet.                           - Continue present medications including                            pantoprazole 40 mg once daily.                           - Await pathology results. Jerene Bears, MD 11/24/2022 3:15:13 PM This report has been signed electronically.

## 2022-11-24 NOTE — Progress Notes (Signed)
Sedate, gd SR's, VSS, report to RN 

## 2022-11-24 NOTE — Progress Notes (Signed)
Patient ID: Shawn Adams, male   DOB: 1976/01/10, 47 y.o.   MRN: [id number]    GASTROENTEROLOGY PROCEDURE H&P NOTE   Primary Care Physician: Fenton Foy, NP    Reason for Procedure:  History of melena, colon cancer screening  Plan:    EGD and colonoscopy  Patient is appropriate for endoscopic procedure(s) in the ambulatory ([place]) setting.  The nature of the procedure, as well as the risks, benefits, and alternatives were carefully and thoroughly reviewed with the patient. Ample time for discussion and questions allowed. The patient understood, was satisfied, and agreed to proceed.     HPI: Shawn Adams is a 47 y.o. male who presents for EGD and colonoscopy.  Medical history as below.  Tolerated the prep.  No recent chest pain or shortness of breath.  No abdominal pain today.  Past Medical History:  Diagnosis Date   Aneurysm ([place])    Pt states happened in 2011, Brain    Asthma    CKD (chronic kidney disease) stage 3, GFR 30-59 ml/min (HCC)    Heart murmur    Pulmonary sarcoidosis (HCC)    Sarcoid    Sickle cell anemia (HCC)     Past Surgical History:  Procedure Laterality Date   LYMPH NODE BIOPSY     NASAL SINUS SURGERY      Prior to Admission medications   Medication Sig Start Date End Date Taking? Authorizing Provider  acetaminophen (TYLENOL) 500 MG tablet Take 500 mg by mouth every 6 (six) hours as needed for moderate pain or fever (pain).    Yes [provider]  albuterol (VENTOLIN HFA) 108 (90 Base) MCG/ACT inhaler Inhale into the lungs. 01/24/20 11/24/22 Yes [provider]  Ascorbic Acid (VITAMIN C) 1000 MG tablet Take 1,000 mg by mouth daily.   Yes [provider]  budesonide-formoterol (SYMBICORT) 160-4.5 MCG/ACT inhaler Inhale 2 puffs into the lungs 2 (two) times daily. 05/10/18 11/24/22 Yes [provider]  cyanocobalamin 50 MCG tablet Take 100 mcg by mouth daily.   Yes [provider]  ergocalciferol (VITAMIN D2)  1.25 MG (50000 UT) capsule Take 50,000 Units by mouth every 30 (thirty) days.  10/24/19  Yes [provider]  folic acid (FOLVITE) 1 MG tablet Take 1 tablet (1 mg total) by mouth daily. 02/09/16  Yes [name], MD  hydroxychloroquine (PLAQUENIL) 200 MG tablet Take 200 mg by mouth daily. 01/13/20  Yes [provider]  hydroxyurea (HYDREA) 500 MG capsule Take 2 capsules (1,000 mg total) by mouth daily. May take with food to minimize GI side effects. 02/09/16  Yes [name], [name], MD  INCRUSE ELLIPTA 62.5 MCG/INH AEPB Inhale 1 puff into the lungs daily as needed (for shortness of breath). 03/17/20  Yes [provider]  Melatonin 10 MG TABS Take 10 mg by mouth at bedtime.   Yes [provider]  ondansetron (ZOFRAN) 4 MG tablet Take by mouth. 11/22/21  Yes [provider]  OXBRYTA 500 MG TABS tablet Take 1,500 mg by mouth at bedtime. 03/03/20  Yes [provider]  Oxycodone HCl 20 MG TABS Take by mouth. 08/23/22  Yes [provider]  pantoprazole (PROTONIX) 40 MG tablet Take 1 tablet (40 mg total) by mouth daily. 01/07/22  Yes Passmore, Jake Church I, NP  predniSONE (DELTASONE) 10 MG tablet Take 2 tablets (20 mg) by mouth daily for 1 month (11/24 - 12/24) then decrease to 1 tablet (10 mg) by mouth daily until follow up with pulmonology  08/20/22  Yes [provider]  sodium bicarbonate 650 MG tablet Take 650 mg by mouth daily. 10/14/19  Yes [provider]  SYMBICORT 80-4.5 MCG/ACT inhaler Inhale 2 puffs into the lungs daily as needed (for shortness of breath). 11/24/20  Yes [provider]  Vitamin D, Ergocalciferol, (DRISDOL) 1.25 MG (50000 UNIT) CAPS capsule TAKE 1 CAPSULE BY MOUTH EVERY 7 DAYS 10/24/22  Yes Fenton Foy, NP  cyclobenzaprine (FLEXERIL) 10 MG tablet Take 1 tablet (10 mg total) by mouth 3 (three) times daily as needed for muscle spasms. 04/09/18   Street, [place], PA-C  docusate sodium (COLACE) 100  MG capsule Take 100 mg by mouth daily as needed for mild constipation.    [provider]    Current Outpatient Medications  Medication Sig Dispense Refill   acetaminophen (TYLENOL) 500 MG tablet Take 500 mg by mouth every 6 (six) hours as needed for moderate pain or fever (pain).      albuterol (VENTOLIN HFA) 108 (90 Base) MCG/ACT inhaler Inhale into the lungs.     Ascorbic Acid (VITAMIN C) 1000 MG tablet Take 1,000 mg by mouth daily.     budesonide-formoterol (SYMBICORT) 160-4.5 MCG/ACT inhaler Inhale 2 puffs into the lungs 2 (two) times daily.     cyanocobalamin 50 MCG tablet Take 100 mcg by mouth daily.     ergocalciferol (VITAMIN D2) 1.25 MG (50000 UT) capsule Take 50,000 Units by mouth every 30 (thirty) days.      folic acid (FOLVITE) 1 MG tablet Take 1 tablet (1 mg total) by mouth daily. 90 tablet 3   hydroxychloroquine (PLAQUENIL) 200 MG tablet Take 200 mg by mouth daily.     hydroxyurea (HYDREA) 500 MG capsule Take 2 capsules (1,000 mg total) by mouth daily. May take with food to minimize GI side effects. 180 capsule 3   INCRUSE ELLIPTA 62.5 MCG/INH AEPB Inhale 1 puff into the lungs daily as needed (for shortness of breath).     Melatonin 10 MG TABS Take 10 mg by mouth at bedtime.     ondansetron (ZOFRAN) 4 MG tablet Take by mouth.     OXBRYTA 500 MG TABS tablet Take 1,500 mg by mouth at bedtime.     Oxycodone HCl 20 MG TABS Take by mouth.     pantoprazole (PROTONIX) 40 MG tablet Take 1 tablet (40 mg total) by mouth daily. 30 tablet 3   predniSONE (DELTASONE) 10 MG tablet Take 2 tablets (20 mg) by mouth daily for 1 month (11/24 - 12/24) then decrease to 1 tablet (10 mg) by mouth daily until follow up with pulmonology     sodium bicarbonate 650 MG tablet Take 650 mg by mouth daily.     SYMBICORT 80-4.5 MCG/ACT inhaler Inhale 2 puffs into the lungs daily as needed (for shortness of breath).     Vitamin D, Ergocalciferol, (DRISDOL) 1.25 MG (50000 UNIT) CAPS capsule TAKE 1  CAPSULE BY MOUTH EVERY 7 DAYS 5 capsule 0   cyclobenzaprine (FLEXERIL) 10 MG tablet Take 1 tablet (10 mg total) by mouth 3 (three) times daily as needed for muscle spasms. 15 tablet 0   docusate sodium (COLACE) 100 MG capsule Take 100 mg by mouth daily as needed for mild constipation.     Current Facility-Administered Medications  Medication Dose Route Frequency Provider Last Rate Last Admin   0.9 %  sodium chloride infusion  500 mL Intravenous Once Jamale Spangler, Lajuan Lines, MD        Allergies as  of 11/24/2022   (No Known Allergies)    Family History  Problem Relation Age of Onset   Diabetes Mother    Hypertension Father    Lupus Sister    Colon cancer Neg Hx    Esophageal cancer Neg Hx    Stomach cancer Neg Hx    Rectal cancer Neg Hx     Social History   Socioeconomic History   Marital status: Single    Spouse name: Not on file   Number of children: Not on file   Years of education: Not on file   Highest education level: Not on file  Occupational History   Not on file  Tobacco Use   Smoking status: Former    Types: Cigarettes   Smokeless tobacco: Never  Vaping Use   Vaping Use: Never used  Substance and Sexual Activity   Alcohol use: Not Currently    Alcohol/week: 1.0 standard drink of alcohol    Types: 1 Cans of beer per week    Comment: occasionally   Drug use: Not Currently    Types: Marijuana   Sexual activity: Yes    Birth control/protection: None  Other Topics Concern   Not on file  Social History Narrative   Not on file   Social Determinants of Health   Financial Resource Strain: Not on file  Food Insecurity: Not on file  Transportation Needs: Not on file  Physical Activity: Not on file  Stress: Not on file  Social Connections: Not on file  Intimate Partner Violence: Not on file    Physical Exam: Vital signs in last 24 hours: '@BP'$  139/81   Pulse 85   Temp 99.5 F (37.5 C)   Ht 6' (1.829 m)   Wt 141 lb (64 kg)   SpO2 94%   BMI 19.12 kg/m  GEN:  NAD EYE: Sclerae anicteric ENT: MMM CV: Non-tachycardic Pulm: CTA b/l GI: Soft, NT/ND NEURO:  Alert & Oriented x 3   Zenovia Jarred, MD [facility]  11/24/2022 2:33 PM

## 2022-11-24 NOTE — Op Note (Signed)
[facility] Patient Name: Shawn Adams Procedure Date: 11/24/2022 2:51 PM MRN: [id number] Endoscopist: Jerene Bears , MD, [id number] Age: 47 Referring MD:  Date of Birth: 1975-12-23 Gender: Male Account #: [id number] Procedure:                Colonoscopy Indications:              Screening for colorectal malignant neoplasm, This                            is the patient's first colonoscopy Medicines:                Monitored Anesthesia Care Procedure:                Pre-Anesthesia Assessment:                           - Prior to the procedure, a History and Physical                            was performed, and patient medications and                            allergies were reviewed. The patient's tolerance of                            previous anesthesia was also reviewed. The risks                            and benefits of the procedure and the sedation                            options and risks were discussed with the patient.                            All questions were answered, and informed consent                            was obtained. Prior Anticoagulants: The patient has                            taken no anticoagulant or antiplatelet agents. ASA                            Grade Assessment: III - A patient with severe                            systemic disease. After reviewing the risks and                            benefits, the patient was deemed in satisfactory                            condition to undergo the procedure.  After obtaining informed consent, the colonoscope                            was passed under direct vision. Throughout the                            procedure, the patient's blood pressure, pulse, and                            oxygen saturations were monitored continuously. The                            Olympus CF-[id number] SN [id number] was introduced through                            the anus and advanced  to the cecum, identified by                            appendiceal orifice and ileocecal valve. The                            colonoscopy was performed without difficulty. The                            patient tolerated the procedure well. The quality                            of the bowel preparation was good. The ileocecal                            valve, appendiceal orifice, and rectum were                            photographed. Scope In: 2:54:02 PM Scope Out: 3:07:55 PM Scope Withdrawal Time: 0 hours 8 minutes 12 seconds  Total Procedure Duration: 0 hours 13 minutes 53 seconds  Findings:                 The digital rectal exam was normal.                           The entire examined colon appeared normal on direct                            and retroflexion views. Complications:            No immediate complications. Estimated Blood Loss:     Estimated blood loss: none. Impression:               - The entire examined colon is normal on direct and                            retroflexion views.                           - No specimens collected. Recommendation:           -  Patient has a contact number available for                            emergencies. The signs and symptoms of potential                            delayed complications were discussed with the                            patient. Return to normal activities tomorrow.                            Written discharge instructions were provided to the                            patient.                           - Resume previous diet.                           - Continue present medications.                           - Repeat colonoscopy in 10 years for screening                            purposes. Jerene Bears, MD 11/24/2022 3:11:34 PM This report has been signed electronically.

## 2022-11-24 NOTE — Progress Notes (Signed)
Called to room to assist during endoscopic procedure.  Patient ID and intended procedure confirmed with present staff. Received instructions for my participation in the procedure from the performing physician.  

## 2022-11-24 NOTE — Patient Instructions (Addendum)
Recommendation- Patient has a contact number available for                            emergencies. The signs and symptoms of potential                            delayed complications were discussed with the                            patient. Return to normal activities tomorrow.                            Written discharge instructions were provided to the                            patient.                           - Resume previous diet.                           - Continue present medications including                            pantoprazole 40 mg once daily.                           - Await pathology results. Recommendation- Patient has a contact number available for                            emergencies. The signs and symptoms of potential                            delayed complications were discussed with the                            patient. Return to normal activities tomorrow.                            Written discharge instructions were provided to the                            patient.                           - Resume previous diet.                           - Continue present medications.                           - Repeat colonoscopy in 10 years for screening                            purposes.  Handout on Gastritis given.  YOU HAD AN ENDOSCOPIC PROCEDURE TODAY AT THE [facility]:   Refer to the procedure report that was given to you for any specific questions about what was found during the examination.  If the procedure report does not answer your questions, please call your gastroenterologist to clarify.  If you requested that your care partner not be given the details of your procedure findings, then the procedure report has been included in a sealed envelope for you to review at your convenience later.  YOU SHOULD EXPECT: Some feelings of bloating in the abdomen. Passage of more gas than usual.  Walking can help get rid of the air that was put into  your GI tract during the procedure and reduce the bloating. If you had a lower endoscopy (such as a colonoscopy or flexible sigmoidoscopy) you may notice spotting of blood in your stool or on the toilet paper. If you underwent a bowel prep for your procedure, you may not have a normal bowel movement for a few days.  Please Note:  You might notice some irritation and congestion in your nose or some drainage.  This is from the oxygen used during your procedure.  There is no need for concern and it should clear up in a day or so.  SYMPTOMS TO REPORT IMMEDIATELY:  Following lower endoscopy (colonoscopy or flexible sigmoidoscopy):  Excessive amounts of blood in the stool  Significant tenderness or worsening of abdominal pains  Swelling of the abdomen that is new, acute  Fever of 100F or higher  Following upper endoscopy (EGD)  Vomiting of blood or coffee ground material  New chest pain or pain under the shoulder blades  Painful or persistently difficult swallowing  New shortness of breath  Fever of 100F or higher  Black, tarry-looking stools  For urgent or emergent issues, a gastroenterologist can be reached at any hour by calling [phone]. Do not use MyChart messaging for urgent concerns.    DIET:  We do recommend a small meal at first, but then you may proceed to your regular diet.  Drink plenty of fluids but you should avoid alcoholic beverages for 24 hours.  ACTIVITY:  You should plan to take it easy for the rest of today and you should NOT DRIVE or use heavy machinery until tomorrow (because of the sedation medicines used during the test).    FOLLOW UP: Our staff will call the number listed on your records the next business day following your procedure.  We will call around 7:15- 8:00 am to check on you and address any questions or concerns that you may have regarding the information given to you following your procedure. If we do not reach you, we will leave a message.     If  any biopsies were taken you will be contacted by phone or by letter within the next 1-3 weeks.  Please call us at [phone] if you have not heard about the biopsies in 3 weeks.    SIGNATURES/CONFIDENTIALITY: You and/or your care partner have signed paperwork which will be entered into your electronic medical record.  These signatures attest to the fact that that the information above on your After Visit Summary has been reviewed and is understood.  Full responsibility of the confidentiality of this discharge information lies with you and/or your care-partner.

## 2022-11-24 NOTE — Telephone Encounter (Signed)
PT is calling because he needs a doctor's note for being at procedure today at 230pm/ Please advise

## 2022-11-25 ENCOUNTER — Telehealth: Payer: Self-pay

## 2022-11-25 NOTE — Telephone Encounter (Signed)
  Follow up Call-     11/24/2022    2:12 PM  Call back number  Post procedure Call Back phone  # [phone]  Permission to leave phone message Yes     Patient questions:  Do you have a fever, pain , or abdominal swelling? No. Pain Score  0 *  Have you tolerated food without any problems? Yes.    Have you been able to return to your normal activities? Yes.    Do you have any questions about your discharge instructions: Diet   No. Medications  No. Follow up visit  No.  Do you have questions or concerns about your Care? No.  Actions: * If pain score is 4 or above: No action needed, pain <4.

## 2022-11-26 ENCOUNTER — Other Ambulatory Visit: Payer: Self-pay | Admitting: Nurse Practitioner

## 2022-12-01 ENCOUNTER — Encounter: Payer: Self-pay | Admitting: Internal Medicine

## 2022-12-05 DIAGNOSIS — J4550 Severe persistent asthma, uncomplicated: Secondary | ICD-10-CM | POA: Diagnosis not present

## 2022-12-08 ENCOUNTER — Telehealth: Payer: Self-pay | Admitting: Nurse Practitioner

## 2022-12-08 DIAGNOSIS — D571 Sickle-cell disease without crisis: Secondary | ICD-10-CM | POA: Diagnosis not present

## 2022-12-08 DIAGNOSIS — G8929 Other chronic pain: Secondary | ICD-10-CM | POA: Diagnosis not present

## 2022-12-08 DIAGNOSIS — E559 Vitamin D deficiency, unspecified: Secondary | ICD-10-CM | POA: Diagnosis not present

## 2022-12-08 DIAGNOSIS — T8089XA Other complications following infusion, transfusion and therapeutic injection, initial encounter: Secondary | ICD-10-CM | POA: Diagnosis not present

## 2022-12-08 NOTE — Telephone Encounter (Signed)
Called patient to schedule Medicare Annual Wellness Visit (AWV). Left message for patient to call back and schedule Medicare Annual Wellness Visit (AWV).  Last date of AWV:  AWVI eligible as of 09/27/2011  Please schedule an AWVI appointment at any time with Annual Wellness Visit.  If any questions, please contact me at [phone].   Thank you,  Cannon Ball Direct dial  [phone]

## 2023-01-04 DIAGNOSIS — H5213 Myopia, bilateral: Secondary | ICD-10-CM | POA: Diagnosis not present

## 2023-01-09 ENCOUNTER — Ambulatory Visit: Payer: Medicare HMO | Admitting: Nurse Practitioner

## 2023-01-20 ENCOUNTER — Ambulatory Visit: Payer: Medicare HMO | Admitting: Nurse Practitioner

## 2023-01-24 ENCOUNTER — Telehealth: Payer: Self-pay | Admitting: Nurse Practitioner

## 2023-01-24 NOTE — Telephone Encounter (Signed)
Called patient to schedule Medicare Annual Wellness Visit (AWV). Left message for patient to call back and schedule Medicare Annual Wellness Visit (AWV).  Last date of AWV: due 09/27/2011 awvi per [facility]  Please schedule an appointment at any time with Abby, NHA. .  If any questions, please contact me at [phone].  Thank you,  Judeth Cornfield,  AMB Clinical Support [facility] AWV Program Direct Dial ??[phone]

## 2023-02-08 ENCOUNTER — Telehealth: Payer: Self-pay | Admitting: Nurse Practitioner

## 2023-02-08 NOTE — Telephone Encounter (Signed)
Called patient to schedule Medicare Annual Wellness Visit (AWV). Left message for patient to call back and schedule Medicare Annual Wellness Visit (AWV).  Last date of AWV: due 09/27/2011 awvi per [facility]  Please schedule an appointment at any time with Abby, NHA. .  If any questions, please contact me at [phone].  Thank you,  Stephanie,  AMB Clinical Support [facility] AWV Program Direct Dial ??[phone]    

## 2023-02-22 ENCOUNTER — Emergency Department (HOSPITAL_COMMUNITY): Payer: Medicare HMO

## 2023-02-22 ENCOUNTER — Other Ambulatory Visit: Payer: Self-pay

## 2023-02-22 ENCOUNTER — Encounter (HOSPITAL_COMMUNITY): Payer: Self-pay

## 2023-02-22 ENCOUNTER — Inpatient Hospital Stay (HOSPITAL_COMMUNITY)
Admission: EM | Admit: 2023-02-22 | Discharge: 2023-02-25 | DRG: 812 | Disposition: A | Discharge status: Home / Self Care | Payer: Medicare HMO | Attending: Internal Medicine | Admitting: Internal Medicine

## 2023-02-22 DIAGNOSIS — N200 Calculus of kidney: Secondary | ICD-10-CM | POA: Diagnosis present

## 2023-02-22 DIAGNOSIS — Z7951 Long term (current) use of inhaled steroids: Secondary | ICD-10-CM

## 2023-02-22 DIAGNOSIS — G894 Chronic pain syndrome: Secondary | ICD-10-CM | POA: Diagnosis not present

## 2023-02-22 DIAGNOSIS — Z8249 Family history of ischemic heart disease and other diseases of the circulatory system: Secondary | ICD-10-CM

## 2023-02-22 DIAGNOSIS — E559 Vitamin D deficiency, unspecified: Secondary | ICD-10-CM | POA: Diagnosis present

## 2023-02-22 DIAGNOSIS — N179 Acute kidney failure, unspecified: Secondary | ICD-10-CM | POA: Diagnosis present

## 2023-02-22 DIAGNOSIS — Z832 Family history of diseases of the blood and blood-forming organs and certain disorders involving the immune mechanism: Secondary | ICD-10-CM

## 2023-02-22 DIAGNOSIS — K529 Noninfective gastroenteritis and colitis, unspecified: Secondary | ICD-10-CM | POA: Diagnosis not present

## 2023-02-22 DIAGNOSIS — D869 Sarcoidosis, unspecified: Secondary | ICD-10-CM | POA: Diagnosis present

## 2023-02-22 DIAGNOSIS — D631 Anemia in chronic kidney disease: Secondary | ICD-10-CM | POA: Diagnosis present

## 2023-02-22 DIAGNOSIS — D649 Anemia, unspecified: Secondary | ICD-10-CM | POA: Diagnosis not present

## 2023-02-22 DIAGNOSIS — Z833 Family history of diabetes mellitus: Secondary | ICD-10-CM

## 2023-02-22 DIAGNOSIS — R197 Diarrhea, unspecified: Secondary | ICD-10-CM | POA: Diagnosis not present

## 2023-02-22 DIAGNOSIS — Z79899 Other long term (current) drug therapy: Secondary | ICD-10-CM

## 2023-02-22 DIAGNOSIS — D5700 Hb-SS disease with crisis, unspecified: Principal | ICD-10-CM | POA: Diagnosis present

## 2023-02-22 DIAGNOSIS — N182 Chronic kidney disease, stage 2 (mild): Secondary | ICD-10-CM | POA: Diagnosis present

## 2023-02-22 DIAGNOSIS — R160 Hepatomegaly, not elsewhere classified: Secondary | ICD-10-CM | POA: Diagnosis not present

## 2023-02-22 DIAGNOSIS — E872 Acidosis, unspecified: Secondary | ICD-10-CM | POA: Diagnosis present

## 2023-02-22 DIAGNOSIS — N1832 Chronic kidney disease, stage 3b: Secondary | ICD-10-CM | POA: Diagnosis present

## 2023-02-22 DIAGNOSIS — K8020 Calculus of gallbladder without cholecystitis without obstruction: Secondary | ICD-10-CM | POA: Diagnosis present

## 2023-02-22 DIAGNOSIS — R109 Unspecified abdominal pain: Secondary | ICD-10-CM | POA: Diagnosis not present

## 2023-02-22 DIAGNOSIS — Z87891 Personal history of nicotine dependence: Secondary | ICD-10-CM | POA: Diagnosis not present

## 2023-02-22 DIAGNOSIS — D860 Sarcoidosis of lung: Secondary | ICD-10-CM | POA: Diagnosis present

## 2023-02-22 DIAGNOSIS — D571 Sickle-cell disease without crisis: Principal | ICD-10-CM | POA: Diagnosis present

## 2023-02-22 DIAGNOSIS — E860 Dehydration: Secondary | ICD-10-CM | POA: Diagnosis not present

## 2023-02-22 DIAGNOSIS — D638 Anemia in other chronic diseases classified elsewhere: Secondary | ICD-10-CM | POA: Diagnosis present

## 2023-02-22 DIAGNOSIS — R9431 Abnormal electrocardiogram [ECG] [EKG]: Secondary | ICD-10-CM | POA: Diagnosis not present

## 2023-02-22 LAB — URINALYSIS, ROUTINE W REFLEX MICROSCOPIC
Bacteria, UA: NONE SEEN
Bilirubin Urine: NEGATIVE
Glucose, UA: NEGATIVE mg/dL
Hgb urine dipstick: NEGATIVE
Ketones, ur: NEGATIVE mg/dL
Leukocytes,Ua: NEGATIVE
Nitrite: NEGATIVE
Protein, ur: 30 mg/dL — AB
Specific Gravity, Urine: 1.01 (ref 1.005–1.030)
pH: 6 (ref 5.0–8.0)

## 2023-02-22 LAB — CBC WITH DIFFERENTIAL/PLATELET
Abs Immature Granulocytes: 0.04 10*3/uL (ref 0.00–0.07)
Basophils Absolute: 0 10*3/uL (ref 0.0–0.1)
Basophils Relative: 1 %
Eosinophils Absolute: 0.2 10*3/uL (ref 0.0–0.5)
Eosinophils Relative: 3 %
HCT: 15.2 % — ABNORMAL LOW (ref 39.0–52.0)
Hemoglobin: 5.5 g/dL — CL (ref 13.0–17.0)
Immature Granulocytes: 1 %
Lymphocytes Relative: 21 %
Lymphs Abs: 1.2 10*3/uL (ref 0.7–4.0)
MCH: 34.2 pg — ABNORMAL HIGH (ref 26.0–34.0)
MCHC: 36.2 g/dL — ABNORMAL HIGH (ref 30.0–36.0)
MCV: 94.4 fL (ref 80.0–100.0)
Monocytes Absolute: 0.5 10*3/uL (ref 0.1–1.0)
Monocytes Relative: 8 %
Neutro Abs: 3.8 10*3/uL (ref 1.7–7.7)
Neutrophils Relative %: 66 %
Platelets: 349 10*3/uL (ref 150–400)
RBC: 1.61 MIL/uL — ABNORMAL LOW (ref 4.22–5.81)
RDW: 21.9 % — ABNORMAL HIGH (ref 11.5–15.5)
WBC: 5.7 10*3/uL (ref 4.0–10.5)
nRBC: 3.2 % — ABNORMAL HIGH (ref 0.0–0.2)

## 2023-02-22 LAB — COMPREHENSIVE METABOLIC PANEL
ALT: 23 U/L (ref 0–44)
AST: 36 U/L (ref 15–41)
Albumin: 3.7 g/dL (ref 3.5–5.0)
Alkaline Phosphatase: 66 U/L (ref 38–126)
Anion gap: 9 (ref 5–15)
BUN: 31 mg/dL — ABNORMAL HIGH (ref 6–20)
CO2: 15 mmol/L — ABNORMAL LOW (ref 22–32)
Calcium: 8.5 mg/dL — ABNORMAL LOW (ref 8.9–10.3)
Chloride: 113 mmol/L — ABNORMAL HIGH (ref 98–111)
Creatinine, Ser: 2.3 mg/dL — ABNORMAL HIGH (ref 0.61–1.24)
GFR, Estimated: 35 mL/min — ABNORMAL LOW (ref >60)
Glucose, Bld: 94 mg/dL (ref 70–99)
Potassium: 4.2 mmol/L (ref 3.5–5.1)
Sodium: 137 mmol/L (ref 135–145)
Total Bilirubin: 1.5 mg/dL — ABNORMAL HIGH (ref 0.3–1.2)
Total Protein: 7.2 g/dL (ref 6.5–8.1)

## 2023-02-22 LAB — RETICULOCYTES
Immature Retic Fract: 30.3 % — ABNORMAL HIGH (ref 2.3–15.9)
RBC.: 1.6 MIL/uL — ABNORMAL LOW (ref 4.22–5.81)
Retic Count, Absolute: 125 10*3/uL (ref 19.0–186.0)
Retic Ct Pct: 7.8 % — ABNORMAL HIGH (ref 0.4–3.1)

## 2023-02-22 LAB — PREPARE RBC (CROSSMATCH)

## 2023-02-22 LAB — LIPASE, BLOOD: Lipase: 57 U/L — ABNORMAL HIGH (ref 11–51)

## 2023-02-22 LAB — POC OCCULT BLOOD, ED: Fecal Occult Bld: NEGATIVE

## 2023-02-22 LAB — TYPE AND SCREEN: ABO/RH(D): B POS

## 2023-02-22 MED ORDER — SODIUM BICARBONATE 650 MG PO TABS
650.0000 mg | ORAL_TABLET | Freq: Three times a day (TID) | ORAL | Status: DC
  Administered 2023-02-22 – 2023-02-25 (×8): 650 mg via ORAL
  Filled 2023-02-22 (×8): qty 1

## 2023-02-22 MED ORDER — SODIUM CHLORIDE 0.9 % IV SOLN: INTRAVENOUS | Status: DC

## 2023-02-22 MED ORDER — ACETAMINOPHEN 650 MG RE SUPP: 650.0000 mg | Freq: Four times a day (QID) | RECTAL | Status: DC | PRN

## 2023-02-22 MED ORDER — OXYCODONE HCL 5 MG PO TABS
5.0000 mg | ORAL_TABLET | ORAL | Status: DC | PRN
  Administered 2023-02-22: 5 mg via ORAL
  Filled 2023-02-22: qty 1

## 2023-02-22 MED ORDER — SODIUM CHLORIDE 0.9 % IV BOLUS
1000.0000 mL | Freq: Once | INTRAVENOUS | Status: AC
  Administered 2023-02-22: 1000 mL via INTRAVENOUS

## 2023-02-22 MED ORDER — DOCUSATE SODIUM 100 MG PO CAPS
100.0000 mg | ORAL_CAPSULE | Freq: Two times a day (BID) | ORAL | Status: DC
  Administered 2023-02-23: 100 mg via ORAL
  Filled 2023-02-22: qty 1

## 2023-02-22 MED ORDER — ONDANSETRON HCL 4 MG PO TABS: 4.0000 mg | ORAL_TABLET | Freq: Four times a day (QID) | ORAL | Status: DC | PRN

## 2023-02-22 MED ORDER — ACETAMINOPHEN 325 MG PO TABS
650.0000 mg | ORAL_TABLET | Freq: Four times a day (QID) | ORAL | Status: DC | PRN
  Administered 2023-02-22: 650 mg via ORAL
  Filled 2023-02-22: qty 2

## 2023-02-22 MED ORDER — DIPHENHYDRAMINE HCL 25 MG PO CAPS
25.0000 mg | ORAL_CAPSULE | ORAL | Status: AC
  Administered 2023-02-22: 25 mg via ORAL
  Filled 2023-02-22: qty 1

## 2023-02-22 MED ORDER — SODIUM CHLORIDE 0.9% IV SOLUTION: Freq: Once | INTRAVENOUS | Status: DC

## 2023-02-22 MED ORDER — SENNA 8.6 MG PO TABS
1.0000 | ORAL_TABLET | Freq: Two times a day (BID) | ORAL | Status: DC
  Administered 2023-02-23: 8.6 mg via ORAL
  Filled 2023-02-22: qty 1

## 2023-02-22 MED ORDER — OXYCODONE HCL 5 MG PO TABS
5.0000 mg | ORAL_TABLET | Freq: Once | ORAL | Status: AC
  Administered 2023-02-22: 5 mg via ORAL
  Filled 2023-02-22: qty 1

## 2023-02-22 MED ORDER — ONDANSETRON HCL 4 MG/2ML IJ SOLN: 4.0000 mg | Freq: Four times a day (QID) | INTRAMUSCULAR | Status: DC | PRN

## 2023-02-22 NOTE — Progress Notes (Signed)
Pt c/o 7/10 pain, generalized aching and cramping, unrelieved with oxy 5mg  PO.  Anthoney Harada NP made aware, additional does ordered and medication ordered to premedicate for blood transfusion.  Will cont to monitor.

## 2023-02-22 NOTE — ED Notes (Signed)
ED Provider at bedside. 

## 2023-02-22 NOTE — H&P (Addendum)
History and Physical    Shawn Adams ZOX:[id number] DOB: 1976/03/26 DOA: 02/22/2023  PCP: Ivonne Andrew, NP   Patient coming from:  Home  I have personally briefly reviewed patient's old medical records in [facility]  Chief Complaint: Chronic diarrhea and generalized fatigue and weakness  HPI: Shawn Adams is a 47 y.o. male with PMH significant for sickle cell disease, sarcoidosis, CKD stage IIIb, asthma, history of brain aneurysm, chronic diarrhea , chronic pain syndrome, recurrent blood transfusions presented in the ED with worsening of fatigue and tiredness.  Patient reports having chronic diarrhea for several years,  has been following up with gastroenterologist.  Patient reports having watery bowel movements several times a day, states he could not counted, denies any fever, nausea, vomiting, blood in the stools.  Patient reports feeling very weak,  tired and fatigue.  He reports his baseline hemoglobin remains between 7 and 8 and he gets blood transfusion when his hemoglobin drops below 6.  Patient thinks he might be requiring blood transfusion, states he has antibody to the blood group so he needs special blood transfusion.  ED Course: He was hemodynamically stable. Blood pressure 113/76, HR 76, RR 15, temp 98.6, SpO2 100% on room air Labs include sodium 137, potassium 4.2, chloride 113, bicarb 15, glucose 94, BUN 31, creatinine 2.30, calcium 8.5, anion gap 9, alkaline phosphatase 66, albumin 3.7, lipase 57, AST 36, ALT 23, total protein 7.2, total bilirubin 1.5, WBC 5.7, hemoglobin 5.5, hematocrit 15.2, MCV 94.4, platelet 349, reticulocyte count 7.8, immature reticulocyte fraction 30.3, stool for occult blood negative.  CT A/P: No CT evidence for acute intra-abdominal or pelvic abnormality. Cardiomegaly. Gallstone.Hepatomegaly Punctate nonobstructing right kidney stone  Review of Systems:   Review of Systems  Constitutional: Negative.   HENT: Negative.    Eyes: Negative.    Respiratory: Negative.    Cardiovascular: Negative.   Gastrointestinal:  Positive for nausea.  Genitourinary: Negative.   Musculoskeletal:  Positive for back pain.  Skin: Negative.   Neurological:  Positive for weakness.       Generalized weakness, fatigue, tiredness  Endo/Heme/Allergies: Negative.   Psychiatric/Behavioral: Negative.      Past Medical History:  Diagnosis Date   Aneurysm (HCC)    Pt states happened in 2011, Brain    Asthma    CKD (chronic kidney disease) stage 3, GFR 30-59 ml/min (HCC)    Heart murmur    Pulmonary sarcoidosis (HCC)    Sarcoid    Sickle cell anemia (HCC)     Past Surgical History:  Procedure Laterality Date   LYMPH NODE BIOPSY     NASAL SINUS SURGERY       reports that he has quit smoking. His smoking use included cigarettes. He has never used smokeless tobacco. He reports that he does not currently use alcohol after a past usage of about 1.0 standard drink of alcohol per week. He reports that he does not currently use drugs after having used the following drugs: Marijuana.  No Known Allergies  Family History  Problem Relation Age of Onset   Diabetes Mother    Hypertension Father    Lupus Sister    Colon cancer Neg Hx    Esophageal cancer Neg Hx    Stomach cancer Neg Hx    Rectal cancer Neg Hx     Family history reviewed and not pertinent.  Prior to Admission medications   Medication Sig Start Date End Date Taking? Authorizing Provider  acetaminophen (TYLENOL) 500 MG tablet  Take 500 mg by mouth every 6 (six) hours as needed for moderate pain or fever (pain).    Yes [provider]  albuterol (VENTOLIN HFA) 108 (90 Base) MCG/ACT inhaler Inhale into the lungs. 01/24/20 02/22/23 Yes [provider]  Ascorbic Acid (VITAMIN C) 1000 MG tablet Take 1,000 mg by mouth daily.   Yes [provider]  budesonide-formoterol (SYMBICORT) 160-4.5 MCG/ACT inhaler Inhale 2 puffs into the lungs 2 (two) times daily. 05/10/18  02/22/23 Yes [provider]  cyanocobalamin 50 MCG tablet Take 100 mcg by mouth daily.   Yes [provider]  cyclobenzaprine (FLEXERIL) 10 MG tablet Take 1 tablet (10 mg total) by mouth 3 (three) times daily as needed for muscle spasms. 04/09/18  Yes Street, [place], PA-C  docusate sodium (COLACE) 100 MG capsule Take 100 mg by mouth daily as needed for mild constipation.    [provider]  ergocalciferol (VITAMIN D2) 1.25 MG (50000 UT) capsule Take 50,000 Units by mouth every 30 (thirty) days.  10/24/19   [provider]  folic acid (FOLVITE) 1 MG tablet Take 1 tablet (1 mg total) by mouth daily. 02/09/16   Quentin Angst, MD  hydroxychloroquine (PLAQUENIL) 200 MG tablet Take 200 mg by mouth daily. 01/13/20   [provider]  hydroxyurea (HYDREA) 500 MG capsule Take 2 capsules (1,000 mg total) by mouth daily. May take with food to minimize GI side effects. 02/09/16   [name], Phylliss Blakes, MD  INCRUSE ELLIPTA 62.5 MCG/INH AEPB Inhale 1 puff into the lungs daily as needed (for shortness of breath). 03/17/20   [provider]  Melatonin 10 MG TABS Take 10 mg by mouth at bedtime.    [provider]  ondansetron (ZOFRAN) 4 MG tablet Take by mouth. 11/22/21   [provider]  OXBRYTA 500 MG TABS tablet Take 1,500 mg by mouth at bedtime. 03/03/20   [provider]  Oxycodone HCl 20 MG TABS Take by mouth. 08/23/22   [provider]  pantoprazole (PROTONIX) 40 MG tablet Take 1 tablet (40 mg total) by mouth daily. 11/24/22   Pyrtle, Carie Caddy, MD  predniSONE (DELTASONE) 10 MG tablet Take 2 tablets (20 mg) by mouth daily for 1 month (11/24 - 12/24) then decrease to 1 tablet (10 mg) by mouth daily until follow up with pulmonology 08/20/22   [provider]  sodium bicarbonate 650 MG tablet Take 650 mg by mouth daily. 10/14/19   [provider]  SYMBICORT 80-4.5 MCG/ACT inhaler Inhale 2 puffs into the lungs daily  as needed (for shortness of breath). 11/24/20   [provider]  Vitamin D, Ergocalciferol, (DRISDOL) 1.25 MG (50000 UNIT) CAPS capsule TAKE 1 CAPSULE BY MOUTH EVERY 7 DAYS 11/28/22   Ivonne Andrew, NP    Physical Exam: Vitals:   02/22/23 1421 02/22/23 1452 02/22/23 1858  BP: 117/74 115/70   Pulse: 92 78   Resp: 18 18   Temp: 97.8 F (36.6 C) 98.3 F (36.8 C) 98 F (36.7 C)  TempSrc: Oral Oral Oral  SpO2: 92% 97%   Weight: 63.5 kg    Height: 6' (1.829 m)      Constitutional: Appears comfortable, not in any acute distress.   Vitals:   02/22/23 1421 02/22/23 1452 02/22/23 1858  BP: 117/74 115/70   Pulse: 92 78   Resp: 18 18   Temp: 97.8 F (36.6 C) 98.3 F (36.8 C) 98 F (36.7 C)  TempSrc: Oral Oral Oral  SpO2: 92% 97%   Weight: 63.5 kg    Height: 6' (1.829 m)     Eyes: PERRL, lids and conjunctivae normal ENMT: Mucous membranes are moist.  Posterior pharynx without exudate. Neck: normal, supple, no masses, no thyromegaly Respiratory: CTA bilaterally, no wheezing, no crackles, normal respiratory effort, no accessory muscle use. Cardiovascular: S1-S2 heard, regular rate and rhythm, no murmur. Abdomen: Abdomen is soft, mildly tender, nondistended, bowel sounds present Musculoskeletal: . No joint deformity upper and lower extremities. Good ROM, no contractures. Normal muscle tone.  Skin: no rashes, lesions, ulcers. No induration Neurologic: CN 2-12 grossly intact. Sensation intact, DTR normal. Strength 5/5 in all 4.  Psychiatric: Normal judgment and insight. Alert and oriented x 3. Normal mood.    Labs on Admission: I have personally reviewed following labs and imaging studies  CBC: Recent Labs  Lab 02/22/23 1510  WBC 5.7  NEUTROABS 3.8  HGB 5.5*  HCT 15.2*  MCV 94.4  PLT 349   Basic Metabolic Panel: Recent Labs  Lab 02/22/23 1510  NA 137  K 4.2  CL 113*  CO2 15*  GLUCOSE 94  BUN 31*  CREATININE 2.30*  CALCIUM 8.5*   GFR: Estimated  Creatinine Clearance: 36 mL/min (A) (by C-G formula based on SCr of 2.3 mg/dL (H)). Liver Function Tests: Recent Labs  Lab 02/22/23 1510  AST 36  ALT 23  ALKPHOS 66  BILITOT 1.5*  PROT 7.2  ALBUMIN 3.7   Recent Labs  Lab 02/22/23 1510  LIPASE 57*   No results for input(s): "AMMONIA" in the last 168 hours. Coagulation Profile: No results for input(s): "INR", "PROTIME" in the last 168 hours. Cardiac Enzymes: No results for input(s): "CKTOTAL", "CKMB", "CKMBINDEX", "TROPONINI" in the last 168 hours. BNP (last 3 results) No results for input(s): "PROBNP" in the last 8760 hours. HbA1C: No results for input(s): "HGBA1C" in the last 72 hours. CBG: No results for input(s): "GLUCAP" in the last 168 hours. Lipid Profile: No results for input(s): "CHOL", "HDL", "LDLCALC", "TRIG", "CHOLHDL", "LDLDIRECT" in the last 72 hours. Thyroid Function Tests: No results for input(s): "TSH", "T4TOTAL", "FREET4", "T3FREE", "THYROIDAB" in the last 72 hours. Anemia Panel: Recent Labs    02/22/23 1516  RETICCTPCT 7.8*   Urine analysis:    Component Value Date/Time   COLORURINE YELLOW 02/22/2023 1450   APPEARANCEUR CLEAR 02/22/2023 1450   APPEARANCEUR Clear 10/05/2017 1600   LABSPEC 1.010 02/22/2023 1450   PHURINE 6.0 02/22/2023 1450   GLUCOSEU NEGATIVE 02/22/2023 1450   HGBUR NEGATIVE 02/22/2023 1450   BILIRUBINUR NEGATIVE 02/22/2023 1450   BILIRUBINUR negative 01/07/2022 1249   BILIRUBINUR neg 03/18/2021 1513   BILIRUBINUR Negative 10/05/2017 1600   KETONESUR NEGATIVE 02/22/2023 1450   PROTEINUR 30 (A) 02/22/2023 1450   UROBILINOGEN 0.2 01/07/2022 1249   UROBILINOGEN 0.2 12/08/2017 1339   NITRITE NEGATIVE 02/22/2023 1450   LEUKOCYTESUR NEGATIVE 02/22/2023 1450    Radiological Exams on Admission: CT ABDOMEN PELVIS WO CONTRAST  Result Date: 02/22/2023 CLINICAL DATA:  Abdomen pain diarrhea EXAM: CT ABDOMEN AND PELVIS WITHOUT CONTRAST TECHNIQUE: Multidetector CT imaging of the abdomen  and pelvis was performed following the standard protocol without IV contrast. RADIATION DOSE REDUCTION: This exam was performed according to the departmental dose-optimization program which includes automated exposure control, adjustment of the mA and/or kV according to patient size and/or use of iterative reconstruction technique. COMPARISON:  Ultrasound 10/18/2022, CT 04/07/2020 FINDINGS: Lower chest: Cardiomegaly. No acute airspace disease. Bibasilar scarring. Hepatobiliary: Gallstone. No biliary dilatation. Enlarged liver  measuring 21 cm Pancreas: Unremarkable. No pancreatic ductal dilatation or surrounding inflammatory changes. Spleen: Diffusely calcified and atrophied spleen Adrenals/Urinary Tract: Adrenal glands are normal. Kidneys show no hydronephrosis. Bilateral cortical scarring. Punctate nonobstructing stone in the right kidney. The bladder is unremarkable. Stomach/Bowel: Stomach nonenlarged. No dilated small bowel. No acute bowel wall thickening Vascular/Lymphatic: Nonaneurysmal aorta.  No suspicious lymph nodes. Reproductive: Prostate is unremarkable. Other: Negative for pelvic effusion or free air. Musculoskeletal: No acute osseous abnormality IMPRESSION: 1. No CT evidence for acute intra-abdominal or pelvic abnormality. 2. Cardiomegaly. 3. Gallstone. 4. Hepatomegaly 5. Punctate nonobstructing right kidney stone. Electronically Signed   By: Jasmine Pang M.D.   On: 02/22/2023 16:35    EKG: .  EKG ordered,  please review.  Assessment/Plan Principal Problem:   Symptomatic anemia Active Problems:   Sickle cell anemia (HCC)   CKD (chronic kidney disease) stage 2, GFR 60-89 ml/min   Vitamin D deficiency   Anemia of chronic disease   Sarcoidosis  Symptomatic anemia: Patient presented with worsening diarrhea, generalized fatigue and tiredness. Baseline hemoglobin remains between 7 to 8 g. Hemoglobin on arrival 5.5.  Stool for occult blood negative. There is no obvious visible  bleeding. Transfuse 1 unit PRBC.  Patient reports antibody to the blood group. Monitor H&H.  Chronic diarrhea /Dehydration: Patient presented with worsening of chronic diarrhea. He denies any recent antibiotic use or recent outside travel. He appears dry on exam. Continue IV fluid hydration. Obtain stool for C. difficile, GI panel  Acute on chronic CKD IIIb: Baseline creatinine 1.5-2.0.  Creatinine on admission 2.30.   Likely in the setting of dehydration/worsening diarrhea. Continue IV fluid resuscitation.  Avoid nephrotoxic medications Monitor serum creatinine.  Elevated lipase: Patient had mildly elevated lipase.  Also reports mild abdominal tenderness. CT A&P  showed  no acute abdominal or pelvic abnormality noted. Could be mild pancreatitis.  Continue IV hydration.  Sickle cell disease: Patient follows with Dr. Joseph Art.  Pulmonary sarcoidosis: Patient reports he used to take prednisone but has stopped now taking Plaquenil. Resume home medications when medication reconciliation complete.  Chronic pain syndrome: Patient reports taking oxycodone 20 mg every 4 hours as needed Resume when medication reconciliation complete.  Metabolic acidosis; Continue sodium bicarbonate 3 times daily Likely in the setting of chronic diarrhea.  Gallstone /nephrolithiasis: Patient denied any RUQ pain or flank pain . follow-up outpatient.  DVT prophylaxis: SCDs Code Status: Full code Family Communication: No family at bed side. Disposition Plan:   Status is: Inpatient Remains inpatient appropriate because: Admitted for symptomatic anemia in the setting of sickle cell anemia requiring blood transfusion   Consults called: None Admission status: Inpatient   Willeen Niece MD [facility]  If 7PM-7AM, please contact night-coverage   02/22/2023, 7:02 PM

## 2023-02-22 NOTE — ED Provider Notes (Signed)
Shawn Adams Provider Note   CSN: [id number] Arrival date & time: 02/22/23  1410     History  Chief Complaint  Patient presents with   Diarrhea   Abdominal Pain    Shawn Adams is a 47 y.o. male history of CKD, sickle cell, chronic pain, here presenting with diarrhea and abdominal pain.  Patient states that he has been having chronic diarrhea for the last several years.  Patient had a endoscopy and colonoscopy back in February that was unremarkable.  Patient continues to have loose stools and have 1 diarrhea episode a day.  Patient states that today he started having 2 episodes of diarrhea that is more watery than usual.  Patient was not recently on antibiotics.  Patient has some epigastric pain but no vomiting.  The history is provided by the patient.       Home Medications Prior to Admission medications   Medication Sig Start Date End Date Taking? Authorizing Provider  acetaminophen (TYLENOL) 500 MG tablet Take 500 mg by mouth every 6 (six) hours as needed for moderate pain or fever (pain).     [provider]  albuterol (VENTOLIN HFA) 108 (90 Base) MCG/ACT inhaler Inhale into the lungs. 01/24/20 11/24/22  [provider]  Ascorbic Acid (VITAMIN C) 1000 MG tablet Take 1,000 mg by mouth daily.    [provider]  budesonide-formoterol (SYMBICORT) 160-4.5 MCG/ACT inhaler Inhale 2 puffs into the lungs 2 (two) times daily. 05/10/18 11/24/22  [provider]  cyanocobalamin 50 MCG tablet Take 100 mcg by mouth daily.    [provider]  cyclobenzaprine (FLEXERIL) 10 MG tablet Take 1 tablet (10 mg total) by mouth 3 (three) times daily as needed for muscle spasms. 04/09/18   Street, [place], PA-C  docusate sodium (COLACE) 100 MG capsule Take 100 mg by mouth daily as needed for mild constipation.    [provider]  ergocalciferol (VITAMIN D2) 1.25 MG (50000 UT) capsule Take 50,000 Units by mouth  every 30 (thirty) days.  10/24/19   [provider]  folic acid (FOLVITE) 1 MG tablet Take 1 tablet (1 mg total) by mouth daily. 02/09/16   Quentin Angst, MD  hydroxychloroquine (PLAQUENIL) 200 MG tablet Take 200 mg by mouth daily. 01/13/20   [provider]  hydroxyurea (HYDREA) 500 MG capsule Take 2 capsules (1,000 mg total) by mouth daily. May take with food to minimize GI side effects. 02/09/16   [name], Phylliss Blakes, MD  INCRUSE ELLIPTA 62.5 MCG/INH AEPB Inhale 1 puff into the lungs daily as needed (for shortness of breath). 03/17/20   [provider]  Melatonin 10 MG TABS Take 10 mg by mouth at bedtime.    [provider]  ondansetron (ZOFRAN) 4 MG tablet Take by mouth. 11/22/21   [provider]  OXBRYTA 500 MG TABS tablet Take 1,500 mg by mouth at bedtime. 03/03/20   [provider]  Oxycodone HCl 20 MG TABS Take by mouth. 08/23/22   [provider]  pantoprazole (PROTONIX) 40 MG tablet Take 1 tablet (40 mg total) by mouth daily. 11/24/22   Pyrtle, Carie Caddy, MD  predniSONE (DELTASONE) 10 MG tablet Take 2 tablets (20 mg) by mouth daily for 1 month (11/24 - 12/24) then decrease to 1 tablet (10 mg) by mouth daily until follow up with pulmonology 08/20/22   [provider]  sodium bicarbonate 650 MG tablet Take 650 mg by mouth daily. 10/14/19  [provider]  SYMBICORT 80-4.5 MCG/ACT inhaler Inhale 2 puffs into the lungs daily as needed (for shortness of breath). 11/24/20   [provider]  Vitamin D, Ergocalciferol, (DRISDOL) 1.25 MG (50000 UNIT) CAPS capsule TAKE 1 CAPSULE BY MOUTH EVERY 7 DAYS 11/28/22   Ivonne Andrew, NP      Allergies    Patient has no known allergies.    Review of Systems   Review of Systems  Gastrointestinal:  Positive for abdominal pain and diarrhea.  All other systems reviewed and are negative.   Physical Exam Updated Vital Signs BP 115/70 (BP Location: Left Arm)   Pulse 78    Temp 98.3 F (36.8 C) (Oral)   Resp 18   Ht 6' (1.829 m)   Wt 63.5 kg   SpO2 97%   BMI 18.99 kg/m  Physical Exam Vitals and nursing note reviewed.  Constitutional:      Comments: Slightly dehydrated  HENT:     Head: Normocephalic.     Mouth/Throat:     Pharynx: Oropharynx is clear.  Eyes:     Extraocular Movements: Extraocular movements intact.     Pupils: Pupils are equal, round, and reactive to light.  Cardiovascular:     Rate and Rhythm: Normal rate and regular rhythm.  Abdominal:     General: Abdomen is flat.     Comments: + Epigastric tenderness  Skin:    General: Skin is warm.     Capillary Refill: Capillary refill takes less than 2 seconds.  Neurological:     General: No focal deficit present.     Mental Status: He is oriented to person, place, and time.  Psychiatric:        Mood and Affect: Mood normal.        Behavior: Behavior normal.     ED Results / Procedures / Treatments   Labs (all labs ordered are listed, but only abnormal results are displayed) Labs Reviewed  COMPREHENSIVE METABOLIC PANEL - Abnormal; Notable for the following components:      Result Value   Chloride 113 (*)    CO2 15 (*)    BUN 31 (*)    Creatinine, Ser 2.30 (*)    Calcium 8.5 (*)    Total Bilirubin 1.5 (*)    GFR, Estimated 35 (*)    All other components within normal limits  LIPASE, BLOOD - Abnormal; Notable for the following components:   Lipase 57 (*)    All other components within normal limits  URINALYSIS, ROUTINE W REFLEX MICROSCOPIC - Abnormal; Notable for the following components:   Protein, ur 30 (*)    All other components within normal limits  RETICULOCYTES - Abnormal; Notable for the following components:   Retic Ct Pct 7.8 (*)    RBC. 1.60 (*)    Immature Retic Fract 30.3 (*)    All other components within normal limits  GASTROINTESTINAL PANEL BY PCR, STOOL (REPLACES STOOL CULTURE)  C DIFFICILE QUICK SCREEN W PCR REFLEX    CBC WITH DIFFERENTIAL/PLATELET     EKG None  Radiology No results found.  Procedures Procedures     CRITICAL CARE Performed by: Richardean Canal   Total critical care time: 39 minutes  Critical care time was exclusive of separately billable procedures and treating other patients.  Critical care was necessary to treat or prevent imminent or life-threatening deterioration.  Critical care was time spent personally by me on the following activities: development of treatment plan with  patient and/or surrogate as well as nursing, discussions with consultants, evaluation of patient's response to treatment, examination of patient, obtaining history from patient or surrogate, ordering and performing treatments and interventions, ordering and review of laboratory studies, ordering and review of radiographic studies, pulse oximetry and re-evaluation of patient's condition.  Medications Ordered in ED Medications  sodium chloride 0.9 % bolus 1,000 mL (1,000 mLs Intravenous New Bag/Given 02/22/23 1547)    ED Course/ Medical Decision Making/ A&P                             Medical Decision Making Tradarius Mobilia is a 47 y.o. male here presenting with abdominal pain and diarrhea.  Ongoing for several years but had worsening diarrhea today.  Patient appears dehydrated.  He had colonoscopy and endoscopy recently that was unremarkable.  Never was diagnosed with IBD or IBS.  Plan to get CBC and CMP and CT abdomen pelvis.  If he is able to give a stool sample, will send off for GI pathogen and C. difficile.  5:15 PM Patient has mild AKI with creatinine 2.3.  Patient is also profoundly anemic with hemoglobin 5.5.  Patient's usual hemoglobin is around 7.  He states that he usually gets transfused below 6.  His guaiac is negative with brown stool.  I think this is likely from his sickle cell.  I have added on anemia panel.  Patient states that he has antibodies and they usually have to find special blood for him.  At this point, I ordered 1  unit PRBC and patient will be admitted for transfusion for symptomatic anemia.  Problems Addressed: Diarrhea, unspecified type: acute illness or injury Sickle cell disease without crisis [facility]): acute illness or injury  Amount and/or Complexity of Data Reviewed Labs: ordered. Decision-making details documented in ED Course. Radiology: ordered and independent interpretation performed. Decision-making details documented in ED Course.  Risk Prescription drug management. Decision regarding hospitalization.   Final Clinical Impression(s) / ED Diagnoses Final diagnoses:  None    Rx / DC Orders ED Discharge Orders     None         Charlynne Pander, MD 02/22/23 [phone]

## 2023-02-22 NOTE — ED Triage Notes (Signed)
C/o generalized abd pain and diarrhea x3 years.

## 2023-02-23 DIAGNOSIS — D649 Anemia, unspecified: Secondary | ICD-10-CM | POA: Diagnosis not present

## 2023-02-23 LAB — COMPREHENSIVE METABOLIC PANEL
ALT: 35 U/L (ref 0–44)
AST: 44 U/L — ABNORMAL HIGH (ref 15–41)
Albumin: 3.2 g/dL — ABNORMAL LOW (ref 3.5–5.0)
Alkaline Phosphatase: 72 U/L (ref 38–126)
Anion gap: 6 (ref 5–15)
BUN: 26 mg/dL — ABNORMAL HIGH (ref 6–20)
CO2: 16 mmol/L — ABNORMAL LOW (ref 22–32)
Calcium: 8.2 mg/dL — ABNORMAL LOW (ref 8.9–10.3)
Chloride: 115 mmol/L — ABNORMAL HIGH (ref 98–111)
Creatinine, Ser: 2.22 mg/dL — ABNORMAL HIGH (ref 0.61–1.24)
GFR, Estimated: 36 mL/min — ABNORMAL LOW (ref >60)
Glucose, Bld: 90 mg/dL (ref 70–99)
Potassium: 3.9 mmol/L (ref 3.5–5.1)
Sodium: 137 mmol/L (ref 135–145)
Total Bilirubin: 1.4 mg/dL — ABNORMAL HIGH (ref 0.3–1.2)
Total Protein: 6.4 g/dL — ABNORMAL LOW (ref 6.5–8.1)

## 2023-02-23 LAB — PHOSPHORUS: Phosphorus: 4.7 mg/dL — ABNORMAL HIGH (ref 2.5–4.6)

## 2023-02-23 LAB — CBC
HCT: 14.9 % — ABNORMAL LOW (ref 39.0–52.0)
Hemoglobin: 5.3 g/dL — CL (ref 13.0–17.0)
MCH: 32.9 pg (ref 26.0–34.0)
MCHC: 35.6 g/dL (ref 30.0–36.0)
MCV: 92.5 fL (ref 80.0–100.0)
Platelets: 285 10*3/uL (ref 150–400)
RBC: 1.61 MIL/uL — ABNORMAL LOW (ref 4.22–5.81)
RDW: 21.4 % — ABNORMAL HIGH (ref 11.5–15.5)
WBC: 5.3 10*3/uL (ref 4.0–10.5)
nRBC: 2.6 % — ABNORMAL HIGH (ref 0.0–0.2)

## 2023-02-23 LAB — BPAM RBC
ISSUE DATE / TIME: 202405301151
Unit Type and Rh: 5100

## 2023-02-23 LAB — TYPE AND SCREEN
Donor AG Type: NEGATIVE
Unit division: 0

## 2023-02-23 LAB — LACTATE DEHYDROGENASE: LDH: 435 U/L — ABNORMAL HIGH (ref 98–192)

## 2023-02-23 LAB — HIV ANTIBODY (ROUTINE TESTING W REFLEX): HIV Screen 4th Generation wRfx: NONREACTIVE

## 2023-02-23 LAB — PREPARE RBC (CROSSMATCH)

## 2023-02-23 LAB — MAGNESIUM: Magnesium: 1.8 mg/dL (ref 1.7–2.4)

## 2023-02-23 MED ORDER — HYDROMORPHONE HCL 1 MG/ML IJ SOLN
1.0000 mg | Freq: Four times a day (QID) | INTRAMUSCULAR | Status: DC | PRN
  Administered 2023-02-23 (×2): 1 mg via INTRAVENOUS
  Filled 2023-02-23 (×3): qty 1

## 2023-02-23 MED ORDER — OXYCODONE HCL 5 MG PO TABS
15.0000 mg | ORAL_TABLET | ORAL | Status: DC | PRN
  Administered 2023-02-23 – 2023-02-25 (×3): 15 mg via ORAL
  Filled 2023-02-23 (×3): qty 3

## 2023-02-23 MED ORDER — SODIUM CHLORIDE 0.45 % IV SOLN: INTRAVENOUS | Status: DC

## 2023-02-23 MED ORDER — SODIUM CHLORIDE 0.9% IV SOLUTION: Freq: Once | INTRAVENOUS | Status: AC

## 2023-02-23 NOTE — Progress Notes (Addendum)
Subjective: Shawn Adams is a 47 year old male with a past medical history significant for sickle cell disease, sarcoidosis, chronic kidney disease stage IIIb, asthma, history of brain aneurysm, chronic diarrhea, chronic pain syndrome, and recurrent blood transfusions was admitted for symptomatic anemia in the setting of sickle cell pain.  Patient also endorsed chronic diarrhea on admission. Today, patient's hemoglobin is 5.3, he is status post 1 unit PRBCs.  Patient endorses fatigue.  He denies any dizziness or shortness of breath.  He is complaining of pain primarily to abdomen, low back, and lower extremities that is consistent with his previous pain crisis.  He says that pain intensity is 7/10. He denies any urinary symptoms, nausea, vomiting, diarrhea, or constipation.  Objective:  Vital signs in last 24 hours:  Vitals:   02/23/23 0912 02/23/23 1218 02/23/23 1233 02/23/23 1426  BP: 125/79 121/72 135/67 (!) 142/76  Pulse: 64 72 68 74  Resp: 18 18 16 16   Temp: 97.9 F (36.6 C) 98.2 F (36.8 C) 98.2 F (36.8 C) 98.3 F (36.8 C)  TempSrc: Oral Oral Oral Oral  SpO2: 90%  93% 92%  Weight:      Height:        Intake/Output from previous day:   Intake/Output Summary (Last 24 hours) at 02/23/2023 1739 Last data filed at 02/23/2023 1425 Gross per 24 hour  Intake 1260.33 ml  Output --  Net 1260.33 ml    Physical Exam: General: Alert, awake, oriented x3, in no acute distress.  HEENT: /AT PEERL, EOMI Neck: Trachea midline,  no masses, no thyromegal,y no JVD, no carotid bruit OROPHARYNX:  Moist, No exudate/ erythema/lesions.  Heart: Regular rate and rhythm, without murmurs, rubs, gallops, PMI non-displaced, no heaves or thrills on palpation.  Lungs: Clear to auscultation, no wheezing or rhonchi noted. No increased vocal fremitus resonant to percussion  Abdomen: Soft, nontender, nondistended, positive bowel sounds, no masses no hepatosplenomegaly noted..  Neuro: No focal  neurological deficits noted cranial nerves II through XII grossly intact. DTRs 2+ bilaterally upper and lower extremities. Strength 5 out of 5 in bilateral upper and lower extremities. Musculoskeletal: No warm swelling or erythema around joints, no spinal tenderness noted. Psychiatric: Patient alert and oriented x3, good insight and cognition, good recent to remote recall. Lymph node survey: No cervical axillary or inguinal lymphadenopathy noted.  Lab Results:  Basic Metabolic Panel:    Component Value Date/Time   NA 137 02/23/2023 0612   NA 140 10/10/2022 1342   K 3.9 02/23/2023 0612   CL 115 (H) 02/23/2023 0612   CO2 16 (L) 02/23/2023 0612   BUN 26 (H) 02/23/2023 0612   BUN 25 (H) 10/10/2022 1342   CREATININE 2.22 (H) 02/23/2023 0612   CREATININE 1.39 (H) 05/11/2016 1100   GLUCOSE 90 02/23/2023 0612   CALCIUM 8.2 (L) 02/23/2023 0612   CBC:    Component Value Date/Time   WBC 5.3 02/23/2023 0612   HGB 5.3 (LL) 02/23/2023 0612   HGB 8.8 (L) 10/10/2022 1342   HCT 14.9 (L) 02/23/2023 0612   HCT 23.5 (L) 10/10/2022 1342   PLT 285 02/23/2023 0612   PLT 378 10/10/2022 1342   MCV 92.5 02/23/2023 0612   MCV 104 (H) 10/10/2022 1342   NEUTROABS 3.8 02/22/2023 1510   NEUTROABS 2.5 10/10/2022 1342   LYMPHSABS 1.2 02/22/2023 1510   LYMPHSABS 1.3 10/10/2022 1342   MONOABS 0.5 02/22/2023 1510   EOSABS 0.2 02/22/2023 1510   EOSABS 0.1 10/10/2022 1342   BASOSABS 0.0 02/22/2023 1510  BASOSABS 0.0 10/10/2022 1342    No results found for this or any previous visit (from the past 240 hour(s)).  Studies/Results: CT ABDOMEN PELVIS WO CONTRAST  Result Date: 02/22/2023 CLINICAL DATA:  Abdomen pain diarrhea EXAM: CT ABDOMEN AND PELVIS WITHOUT CONTRAST TECHNIQUE: Multidetector CT imaging of the abdomen and pelvis was performed following the standard protocol without IV contrast. RADIATION DOSE REDUCTION: This exam was performed according to the departmental dose-optimization program which  includes automated exposure control, adjustment of the mA and/or kV according to patient size and/or use of iterative reconstruction technique. COMPARISON:  Ultrasound 10/18/2022, CT 04/07/2020 FINDINGS: Lower chest: Cardiomegaly. No acute airspace disease. Bibasilar scarring. Hepatobiliary: Gallstone. No biliary dilatation. Enlarged liver measuring 21 cm Pancreas: Unremarkable. No pancreatic ductal dilatation or surrounding inflammatory changes. Spleen: Diffusely calcified and atrophied spleen Adrenals/Urinary Tract: Adrenal glands are normal. Kidneys show no hydronephrosis. Bilateral cortical scarring. Punctate nonobstructing stone in the right kidney. The bladder is unremarkable. Stomach/Bowel: Stomach nonenlarged. No dilated small bowel. No acute bowel wall thickening Vascular/Lymphatic: Nonaneurysmal aorta.  No suspicious lymph nodes. Reproductive: Prostate is unremarkable. Other: Negative for pelvic effusion or free air. Musculoskeletal: No acute osseous abnormality IMPRESSION: 1. No CT evidence for acute intra-abdominal or pelvic abnormality. 2. Cardiomegaly. 3. Gallstone. 4. Hepatomegaly 5. Punctate nonobstructing right kidney stone. Electronically Signed   By: Jasmine Pang M.D.   On: 02/22/2023 16:35    Medications: Scheduled Meds:  sodium chloride   Intravenous Once   docusate sodium  100 mg Oral BID   senna  1 tablet Oral BID   sodium bicarbonate  650 mg Oral TID   Continuous Infusions:  sodium chloride 50 mL/hr at 02/23/23 1424   PRN Meds:.acetaminophen **OR** acetaminophen, HYDROmorphone (DILAUDID) injection, ondansetron **OR** ondansetron (ZOFRAN) IV, oxyCODONE  Consultants: none  Procedures: none  Antibiotics: none  Assessment/Plan: Principal Problem:   Symptomatic anemia Active Problems:   Sickle cell anemia (HCC)   CKD (chronic kidney disease) stage 2, GFR 60-89 ml/min   Vitamin D deficiency   Anemia of chronic disease   Sarcoidosis  Symptomatic anemia: Currently,  patient's hemoglobin is 5.3 g/dL.  Suspect some hemodilution, patient was on normal saline at 100 mL/h, which was discontinued.  Start 0.45% saline at 50 mL/h Transfuse additional unit PRBCs.  Follow labs.  Sickle cell disease with pain crisis: Initiate oxycodone 15 mg every 4 hours as needed Dilaudid 1 mg every 6 hours as needed for severe breakthrough pain IV fluids, 0.45% saline at 100 mL/h Hold Toradol due to history of CKD  Chronic diarrhea: Patient states that he has not had loose stools since admission.  No gastrointestinal panel warranted.  Continue to monitor closely.  Acute on chronic CKD 3B: Baseline creatinine 1.5-2.  Improving.  Continue to avoid all nephrotoxins.  Continue fluid resuscitation. Follow labs  Pulmonary sarcoidosis: Patient currently on Plaquenil  Chronic pain syndrome: Continue home medications    Code Status: Full Code Family Communication: N/A Disposition Plan: Not yet ready for discharge  Moorea Boissonneault Rennis Petty  APRN, MSN, FNP-C [facility] [address] [place], [place] [address] [phone]  If 7PM-7AM, please contact night-coverage.  02/23/2023, 5:39 PM  LOS: 1 day

## 2023-02-24 DIAGNOSIS — D649 Anemia, unspecified: Secondary | ICD-10-CM | POA: Diagnosis not present

## 2023-02-24 LAB — CBC WITH DIFFERENTIAL/PLATELET
Abs Immature Granulocytes: 0.06 10*3/uL (ref 0.00–0.07)
Basophils Absolute: 0.1 10*3/uL (ref 0.0–0.1)
Basophils Relative: 1 %
Eosinophils Absolute: 0.3 10*3/uL (ref 0.0–0.5)
Eosinophils Relative: 5 %
HCT: 18.4 % — ABNORMAL LOW (ref 39.0–52.0)
Hemoglobin: 6.7 g/dL — CL (ref 13.0–17.0)
Immature Granulocytes: 1 %
Lymphocytes Relative: 27 %
Lymphs Abs: 1.6 10*3/uL (ref 0.7–4.0)
MCH: 33.2 pg (ref 26.0–34.0)
MCHC: 36.4 g/dL — ABNORMAL HIGH (ref 30.0–36.0)
MCV: 91.1 fL (ref 80.0–100.0)
Monocytes Absolute: 0.5 10*3/uL (ref 0.1–1.0)
Monocytes Relative: 8 %
Neutro Abs: 3.3 10*3/uL (ref 1.7–7.7)
Neutrophils Relative %: 58 %
Platelets: 286 10*3/uL (ref 150–400)
RBC: 2.02 MIL/uL — ABNORMAL LOW (ref 4.22–5.81)
RDW: 21.4 % — ABNORMAL HIGH (ref 11.5–15.5)
WBC: 5.8 10*3/uL (ref 4.0–10.5)
nRBC: 4.3 % — ABNORMAL HIGH (ref 0.0–0.2)

## 2023-02-24 LAB — BASIC METABOLIC PANEL
Anion gap: 7 (ref 5–15)
BUN: 23 mg/dL — ABNORMAL HIGH (ref 6–20)
CO2: 16 mmol/L — ABNORMAL LOW (ref 22–32)
Calcium: 8.3 mg/dL — ABNORMAL LOW (ref 8.9–10.3)
Chloride: 115 mmol/L — ABNORMAL HIGH (ref 98–111)
Creatinine, Ser: 1.85 mg/dL — ABNORMAL HIGH (ref 0.61–1.24)
GFR, Estimated: 45 mL/min — ABNORMAL LOW (ref >60)
Glucose, Bld: 93 mg/dL (ref 70–99)
Potassium: 3.8 mmol/L (ref 3.5–5.1)
Sodium: 138 mmol/L (ref 135–145)

## 2023-02-24 LAB — BPAM RBC: ISSUE DATE / TIME: 202405292247

## 2023-02-24 LAB — TYPE AND SCREEN
Donor AG Type: NEGATIVE
Unit division: 0
Unit division: 0
Unit division: 0

## 2023-02-24 NOTE — Progress Notes (Signed)
Subjective: Shawn Adams is a 47 year old male with a past medical history significant for sickle cell disease, sarcoidosis, chronic kidney disease stage IIIb, asthma, history of brain aneurysm, chronic diarrhea, chronic pain syndrome, and recurrent blood transfusions was admitted for symptomatic anemia in the setting of sickle cell pain.  Patient also endorsed chronic diarrhea on admission. Patient states that pain is primarily to right arm, chest, and lower extremities.  He rates his pain at 7/10.  He denies any urinary symptoms, nausea, vomiting, diarrhea, or constipation.  Objective:  Vital signs in last 24 hours:  Vitals:   02/23/23 2008 02/24/23 0412 02/24/23 0927 02/24/23 1402  BP: 135/75 111/65 132/81 (!) 141/80  Pulse: (!) 57 62 80 75  Resp: 18 18    Temp: 97.7 F (36.5 C) 97.6 F (36.4 C) 98.7 F (37.1 C) 98.4 F (36.9 C)  TempSrc: Oral Oral Oral Oral  SpO2: 95% 91% (!) 86% 93%  Weight:      Height:        Intake/Output from previous day:   Intake/Output Summary (Last 24 hours) at 02/24/2023 1407 Last data filed at 02/24/2023 1250 Gross per 24 hour  Intake 1265.07 ml  Output --  Net 1265.07 ml    Physical Exam: General: Alert, awake, oriented x3, in no acute distress.  HEENT: /AT PEERL, EOMI Neck: Trachea midline,  no masses, no thyromegal,y no JVD, no carotid bruit OROPHARYNX:  Moist, No exudate/ erythema/lesions.  Heart: Regular rate and rhythm, without murmurs, rubs, gallops, PMI non-displaced, no heaves or thrills on palpation.  Lungs: Clear to auscultation, no wheezing or rhonchi noted. No increased vocal fremitus resonant to percussion  Abdomen: Soft, nontender, nondistended, positive bowel sounds, no masses no hepatosplenomegaly noted..  Neuro: No focal neurological deficits noted cranial nerves II through XII grossly intact. DTRs 2+ bilaterally upper and lower extremities. Strength 5 out of 5 in bilateral upper and lower extremities. Musculoskeletal: No  warm swelling or erythema around joints, no spinal tenderness noted. Psychiatric: Patient alert and oriented x3, good insight and cognition, good recent to remote recall. Lymph node survey: No cervical axillary or inguinal lymphadenopathy noted.  Lab Results:  Basic Metabolic Panel:    Component Value Date/Time   NA 138 02/24/2023 0704   NA 140 10/10/2022 1342   K 3.8 02/24/2023 0704   CL 115 (H) 02/24/2023 0704   CO2 16 (L) 02/24/2023 0704   BUN 23 (H) 02/24/2023 0704   BUN 25 (H) 10/10/2022 1342   CREATININE 1.85 (H) 02/24/2023 0704   CREATININE 1.39 (H) 05/11/2016 1100   GLUCOSE 93 02/24/2023 0704   CALCIUM 8.3 (L) 02/24/2023 0704   CBC:    Component Value Date/Time   WBC 5.8 02/24/2023 0704   HGB 6.7 (LL) 02/24/2023 0704   HGB 8.8 (L) 10/10/2022 1342   HCT 18.4 (L) 02/24/2023 0704   HCT 23.5 (L) 10/10/2022 1342   PLT 286 02/24/2023 0704   PLT 378 10/10/2022 1342   MCV 91.1 02/24/2023 0704   MCV 104 (H) 10/10/2022 1342   NEUTROABS 3.3 02/24/2023 0704   NEUTROABS 2.5 10/10/2022 1342   LYMPHSABS 1.6 02/24/2023 0704   LYMPHSABS 1.3 10/10/2022 1342   MONOABS 0.5 02/24/2023 0704   EOSABS 0.3 02/24/2023 0704   EOSABS 0.1 10/10/2022 1342   BASOSABS 0.1 02/24/2023 0704   BASOSABS 0.0 10/10/2022 1342    No results found for this or any previous visit (from the past 240 hour(s)).  Studies/Results: CT ABDOMEN PELVIS WO CONTRAST  Result  Date: 02/22/2023 CLINICAL DATA:  Abdomen pain diarrhea EXAM: CT ABDOMEN AND PELVIS WITHOUT CONTRAST TECHNIQUE: Multidetector CT imaging of the abdomen and pelvis was performed following the standard protocol without IV contrast. RADIATION DOSE REDUCTION: This exam was performed according to the departmental dose-optimization program which includes automated exposure control, adjustment of the mA and/or kV according to patient size and/or use of iterative reconstruction technique. COMPARISON:  Ultrasound 10/18/2022, CT 04/07/2020 FINDINGS: Lower  chest: Cardiomegaly. No acute airspace disease. Bibasilar scarring. Hepatobiliary: Gallstone. No biliary dilatation. Enlarged liver measuring 21 cm Pancreas: Unremarkable. No pancreatic ductal dilatation or surrounding inflammatory changes. Spleen: Diffusely calcified and atrophied spleen Adrenals/Urinary Tract: Adrenal glands are normal. Kidneys show no hydronephrosis. Bilateral cortical scarring. Punctate nonobstructing stone in the right kidney. The bladder is unremarkable. Stomach/Bowel: Stomach nonenlarged. No dilated small bowel. No acute bowel wall thickening Vascular/Lymphatic: Nonaneurysmal aorta.  No suspicious lymph nodes. Reproductive: Prostate is unremarkable. Other: Negative for pelvic effusion or free air. Musculoskeletal: No acute osseous abnormality IMPRESSION: 1. No CT evidence for acute intra-abdominal or pelvic abnormality. 2. Cardiomegaly. 3. Gallstone. 4. Hepatomegaly 5. Punctate nonobstructing right kidney stone. Electronically Signed   By: Jasmine Pang M.D.   On: 02/22/2023 16:35    Medications: Scheduled Meds:  sodium chloride   Intravenous Once   sodium bicarbonate  650 mg Oral TID   Continuous Infusions:  sodium chloride 50 mL/hr at 02/24/23 0401   PRN Meds:.acetaminophen **OR** acetaminophen, HYDROmorphone (DILAUDID) injection, ondansetron **OR** ondansetron (ZOFRAN) IV, oxyCODONE  Consultants: none  Procedures: none  Antibiotics: none  Assessment/Plan: Principal Problem:   Symptomatic anemia Active Problems:   Sickle cell anemia (HCC)   CKD (chronic kidney disease) stage 2, GFR 60-89 ml/min   Vitamin D deficiency   Anemia of chronic disease   Sarcoidosis  Symptomatic anemia: Patient's hemoglobin has improved to 7.3 g/dL.  He is status post 2 units PRBCs on yesterday.  Patient is not transfuse unless hemoglobin is less than 6.0 g/dL.  Sickle cell disease with pain crisis: Oxycodone 15 mg every 4 hours as needed Dilaudid 1 mg every 6 hours as needed for  severe breakthrough pain IV fluids, 0.45% saline at [facility] Hold Toradol due to history of CKD  Chronic diarrhea: Patient states that he has not had loose stools since admission.  No gastrointestinal panel warranted.  Continue to monitor closely.  Acute on chronic CKD 3B: Baseline creatinine 1.5-2.  Improving.  Continue to avoid all nephrotoxins.  Continue fluid resuscitation. Follow labs  Pulmonary sarcoidosis: Patient currently on Plaquenil  Chronic pain syndrome: Continue home medications    Code Status: Full Code Family Communication: N/A Disposition Plan: Not yet ready for discharge planned for 02/25/2023  Nolon Nations  APRN, MSN, FNP-C [facility] [address] [place], [place] [address] [phone]  If 7PM-7AM, please contact night-coverage.  02/24/2023, 2:07 PM  LOS: 2 days

## 2023-02-24 NOTE — TOC Progression Note (Signed)
Transition of Care [facility]) - Progression Note    Patient Details  Name: Linc Flamenco MRN: [id number] Date of Birth: 12/13/1975  [facility]) CM/SW Contact  Beckie Busing, RN Phone Number:[phone]  02/24/2023, 3:39 PM  Clinical Narrative:    Transition of Care [facility]) - Inpatient Brief Assessment   Patient Details  Name: Merton Osler MRN: [id number] Date of Birth: [birth year]  Transition of Care [facility]) CM/SW Contact:    Beckie Busing, RN Phone Number: 02/24/2023, 3:39 PM   Clinical Narrative: Assessment complete. No TOC needs noted. TOC will continue to follow.    Transition of Care Asessment: Insurance and Status: Insurance coverage has been reviewed Patient has primary care physician: Yes Home environment has been reviewed: Yes from home Prior level of function:: Independent Prior/Current Home Services: No current home services Social Determinants of Health Reivew: SDOH reviewed no interventions necessary Readmission risk has been reviewed: Yes Transition of care needs: no transition of care needs at this time      Barriers to Discharge: Continued Medical Work up  Expected Discharge Plan and Services                                               Social Determinants of Health (SDOH) Interventions SDOH Screenings   Food Insecurity: No Food Insecurity (02/23/2023)  Housing: Low Risk  (02/23/2023)  Transportation Needs: No Transportation Needs (02/23/2023)  Utilities: Not At Risk (02/23/2023)  Depression (PHQ2-9): Low Risk  (09/05/2022)  Tobacco Use: Medium Risk (02/22/2023)    Readmission Risk Interventions    02/24/2023    3:37 PM  Readmission Risk Prevention Plan  Transportation Screening Complete  PCP or Specialist Appt within 5-7 Days Complete  Home Care Screening Complete  Medication Review (RN CM) Complete

## 2023-02-25 DIAGNOSIS — D649 Anemia, unspecified: Secondary | ICD-10-CM | POA: Diagnosis not present

## 2023-02-25 LAB — HEMOGLOBIN: Hemoglobin: 6.7 g/dL — CL (ref 13.0–17.0)

## 2023-02-25 NOTE — Discharge Summary (Signed)
Physician Discharge Summary   Patient: Shawn Adams MRN: [id number] DOB: 28-Jul-1976  Admit date:     02/22/2023  Discharge date: 02/25/2023  Discharge Physician: Lonia Blood   PCP: Ivonne Andrew, NP   Recommendations at discharge:   Follow-up with your PCP and have blood work done within few days  Discharge Diagnoses: Principal Problem:   Symptomatic anemia Active Problems:   Sickle cell anemia (HCC)   CKD (chronic kidney disease) stage 2, GFR 60-89 ml/min   Vitamin D deficiency   Anemia of chronic disease   Sarcoidosis  Resolved Problems:   * No resolved hospital problems. [place] [facility] Course: No notes on file  Assessment and Plan: No notes have been filed under this hospital service. Service: Hospitalist        Consultants: None Procedures performed: Blood transfusions Disposition: Home Diet recommendation:  Regular diet DISCHARGE MEDICATION: Allergies as of 02/25/2023   No Known Allergies      Medication List     TAKE these medications    acetaminophen 500 MG tablet Commonly known as: TYLENOL Take 500 mg by mouth every 6 (six) hours as needed for moderate pain or fever (pain).   albuterol 108 (90 Base) MCG/ACT inhaler Commonly known as: VENTOLIN HFA Inhale into the lungs.   cyanocobalamin 50 MCG tablet Take 100 mcg by mouth daily.   cyclobenzaprine 10 MG tablet Commonly known as: FLEXERIL Take 1 tablet (10 mg total) by mouth 3 (three) times daily as needed for muscle spasms.   docusate sodium 100 MG capsule Commonly known as: COLACE Take 100 mg by mouth daily as needed for mild constipation.   folic acid 1 MG tablet Commonly known as: FOLVITE Take 1 tablet (1 mg total) by mouth daily.   hydroxychloroquine 200 MG tablet Commonly known as: PLAQUENIL Take 200 mg by mouth daily.   hydroxyurea 500 MG capsule Commonly known as: HYDREA Take 2 capsules (1,000 mg total) by mouth daily. May take with food to minimize GI side effects.    Incruse Ellipta 62.5 MCG/ACT Aepb Generic drug: umeclidinium bromide Inhale 1 puff into the lungs daily as needed (for shortness of breath).   losartan 25 MG tablet Commonly known as: COZAAR Take 25 mg by mouth daily.   Melatonin 10 MG Tabs Take 10 mg by mouth at bedtime.   ondansetron 4 MG tablet Commonly known as: ZOFRAN Take 4 mg by mouth every 8 (eight) hours as needed for vomiting or nausea.   Oxbryta 500 MG Tabs tablet Generic drug: voxelotor Take 1,500 mg by mouth at bedtime.   Oxycodone HCl 20 MG Tabs Take 1 tablet by mouth daily as needed (For pain).   pantoprazole 40 MG tablet Commonly known as: PROTONIX Take 1 tablet (40 mg total) by mouth daily.   sodium bicarbonate 650 MG tablet Take 650 mg by mouth daily.   Symbicort 160-4.5 MCG/ACT inhaler Generic drug: budesonide-formoterol Inhale 2 puffs into the lungs 2 (two) times daily.   Symbicort 80-4.5 MCG/ACT inhaler Generic drug: budesonide-formoterol Inhale 2 puffs into the lungs daily as needed (for shortness of breath).   vitamin C 1000 MG tablet Take 1,000 mg by mouth daily.   Vitamin D (Ergocalciferol) 1.25 MG (50000 UNIT) Caps capsule Commonly known as: DRISDOL TAKE 1 CAPSULE BY MOUTH EVERY 7 DAYS        Discharge Exam: Filed Weights   02/22/23 1421  Weight: 63.5 kg   Constitutional: NAD, calm, comfortable Eyes: PERRL, lids and conjunctivae normal ENMT: Mucous membranes  are moist. Posterior pharynx clear of any exudate or lesions.Normal dentition.  Neck: normal, supple, no masses, no thyromegaly Respiratory: clear to auscultation bilaterally, no wheezing, no crackles. Normal respiratory effort. No accessory muscle use.  Cardiovascular: Regular rate and rhythm, no murmurs / rubs / gallops. No extremity edema. 2+ pedal pulses. No carotid bruits.  Abdomen: no tenderness, no masses palpated. No hepatosplenomegaly. Bowel sounds positive.  Musculoskeletal: Good range of motion, no joint swelling  or tenderness, Skin: no rashes, lesions, ulcers. No induration Neurologic: CN 2-12 grossly intact. Sensation intact, DTR normal. Strength 5/5 in all 4.  Psychiatric: Normal judgment and insight. Alert and oriented x 3. Normal mood   Condition at discharge: good  The results of significant diagnostics from this hospitalization (including imaging, microbiology, ancillary and laboratory) are listed below for reference.   Imaging Studies: CT ABDOMEN PELVIS WO CONTRAST  Result Date: 02/22/2023 CLINICAL DATA:  Abdomen pain diarrhea EXAM: CT ABDOMEN AND PELVIS WITHOUT CONTRAST TECHNIQUE: Multidetector CT imaging of the abdomen and pelvis was performed following the standard protocol without IV contrast. RADIATION DOSE REDUCTION: This exam was performed according to the departmental dose-optimization program which includes automated exposure control, adjustment of the mA and/or kV according to patient size and/or use of iterative reconstruction technique. COMPARISON:  Ultrasound 10/18/2022, CT 04/07/2020 FINDINGS: Lower chest: Cardiomegaly. No acute airspace disease. Bibasilar scarring. Hepatobiliary: Gallstone. No biliary dilatation. Enlarged liver measuring 21 cm Pancreas: Unremarkable. No pancreatic ductal dilatation or surrounding inflammatory changes. Spleen: Diffusely calcified and atrophied spleen Adrenals/Urinary Tract: Adrenal glands are normal. Kidneys show no hydronephrosis. Bilateral cortical scarring. Punctate nonobstructing stone in the right kidney. The bladder is unremarkable. Stomach/Bowel: Stomach nonenlarged. No dilated small bowel. No acute bowel wall thickening Vascular/Lymphatic: Nonaneurysmal aorta.  No suspicious lymph nodes. Reproductive: Prostate is unremarkable. Other: Negative for pelvic effusion or free air. Musculoskeletal: No acute osseous abnormality IMPRESSION: 1. No CT evidence for acute intra-abdominal or pelvic abnormality. 2. Cardiomegaly. 3. Gallstone. 4. Hepatomegaly 5.  Punctate nonobstructing right kidney stone. Electronically Signed   By: Jasmine Pang M.D.   On: 02/22/2023 16:35    Microbiology: Results for orders placed or performed during the hospital encounter of 10/16/21  Resp Panel by RT-PCR (Flu A&B, Covid) Nasopharyngeal Swab     Status: None   Collection Time: 10/16/21  3:17 AM   Specimen: Nasopharyngeal Swab; Nasopharyngeal(NP) swabs in vial transport medium  Result Value Ref Range Status   SARS Coronavirus 2 by RT PCR NEGATIVE NEGATIVE Final    Comment: (NOTE) SARS-CoV-2 target nucleic acids are NOT DETECTED.  The SARS-CoV-2 RNA is generally detectable in upper respiratory specimens during the acute phase of infection. The lowest concentration of SARS-CoV-2 viral copies this assay can detect is 138 copies/mL. A negative result does not preclude SARS-Cov-2 infection and should not be used as the sole basis for treatment or other patient management decisions. A negative result may occur with  improper specimen collection/handling, submission of specimen other than nasopharyngeal swab, presence of viral mutation(s) within the areas targeted by this assay, and inadequate number of viral copies(<138 copies/mL). A negative result must be combined with clinical observations, patient history, and epidemiological information. The expected result is Negative.  Fact Sheet for Patients:  BloggerCourse.com  Fact Sheet for Healthcare Providers:  SeriousBroker.it  This test is no t yet approved or cleared by the [place] FDA and  has been authorized for detection and/or diagnosis of SARS-CoV-2 by FDA under an Emergency Use Authorization (EUA). This EUA will  remain  in effect (meaning this test can be used) for the duration of the COVID-19 declaration under Section 564(b)(1) of the Act, 21 U.S.C.section 360bbb-3(b)(1), unless the authorization is terminated  or revoked sooner.        Influenza A by PCR NEGATIVE NEGATIVE Final   Influenza B by PCR NEGATIVE NEGATIVE Final    Comment: (NOTE) The Xpert Xpress SARS-CoV-2/FLU/RSV plus assay is intended as an aid in the diagnosis of influenza from Nasopharyngeal swab specimens and should not be used as a sole basis for treatment. Nasal washings and aspirates are unacceptable for Xpert Xpress SARS-CoV-2/FLU/RSV testing.  Fact Sheet for Patients: BloggerCourse.com  Fact Sheet for Healthcare Providers: SeriousBroker.it  This test is not yet approved or cleared by the [place] FDA and has been authorized for detection and/or diagnosis of SARS-CoV-2 by FDA under an Emergency Use Authorization (EUA). This EUA will remain in effect (meaning this test can be used) for the duration of the COVID-19 declaration under Section 564(b)(1) of the Act, 21 U.S.C. section 360bbb-3(b)(1), unless the authorization is terminated or revoked.  Performed at [facility], [address] [place], [place] [address]     Labs: CBC: Recent Labs  Lab 02/22/23 1510 02/23/23 0612 02/24/23 0704  WBC 5.7 5.3 5.8  NEUTROABS 3.8  --  3.3  HGB 5.5* 5.3* 6.7*  HCT 15.2* 14.9* 18.4*  MCV 94.4 92.5 91.1  PLT 349 285 286   Basic Metabolic Panel: Recent Labs  Lab 02/22/23 1510 02/23/23 0612 02/24/23 0704  NA 137 137 138  K 4.2 3.9 3.8  CL 113* 115* 115*  CO2 15* 16* 16*  GLUCOSE 94 90 93  BUN 31* 26* 23*  CREATININE 2.30* 2.22* 1.85*  CALCIUM 8.5* 8.2* 8.3*  MG  --  1.8  --   PHOS  --  4.7*  --    Liver Function Tests: Recent Labs  Lab 02/22/23 1510 02/23/23 0612  AST 36 44*  ALT 23 35  ALKPHOS 66 72  BILITOT 1.5* 1.4*  PROT 7.2 6.4*  ALBUMIN 3.7 3.2*   CBG: No results for input(s): "GLUCAP" in the last 168 hours.  Discharge time spent: greater than 30 minutes.  SignedLonia Blood, MD [facility] 02/25/2023

## 2023-02-25 NOTE — Hospital Course (Signed)
Shawn Adams is a 47 y.o. male with PMH significant for sickle cell disease, sarcoidosis, CKD stage IIIb, asthma, history of brain aneurysm, chronic diarrhea , chronic pain syndrome, recurrent blood transfusions presented in the ED with worsening of fatigue and tiredness.  Patient reports having chronic diarrhea for several years,  has been following up with gastroenterologist.  Patient reports having watery bowel movements several times a day, states he could not counted, denies any fever, nausea, vomiting, blood in the stools.  Patient reports feeling very weak,  tired and fatigue.  He reports his baseline hemoglobin remains between 7 and 8 and he gets blood transfusion when his hemoglobin drops below 6.  Patient thinks he might be requiring blood transfusion, states he has antibody to the blood group so he needs special blood transfusion   Patient was admitted and initiated on transfusion of 2 units of packed red blood cells.  Hemoglobin stayed about 7 g.  He was also treated for sickle cell crisis including Dilaudid pushes oxycodone and IV fluids.  He reported chronic diarrhea which was addressed and no diarrhea at this point.  His renal function remained stable he has a history of pulmonary sarcoidosis and chronic pain syndrome which we will address accordingly.  At this point he is ready for discharge home.  Resume all his chronic home regimen.

## 2023-02-25 NOTE — Progress Notes (Signed)
Patient discharged to home with family, discharge instructions reviewed with patient who verbalized understanding. No new medications. 

## 2023-02-26 LAB — TYPE AND SCREEN
Antibody Screen: NEGATIVE
Donor AG Type: NEGATIVE
Donor AG Type: NEGATIVE

## 2023-02-26 LAB — BPAM RBC
Blood Product Expiration Date: 202406112359
Blood Product Expiration Date: 202406302359
Blood Product Expiration Date: 202407032359
Unit Type and Rh: 5100
Unit Type and Rh: 5100

## 2023-02-27 ENCOUNTER — Telehealth: Payer: Self-pay | Admitting: *Deleted

## 2023-02-27 ENCOUNTER — Encounter: Payer: Self-pay | Admitting: *Deleted

## 2023-02-27 NOTE — Transitions of Care (Post Inpatient/ED Visit) (Signed)
02/27/2023  Name: Shawn Adams MRN: [id number] DOB: 05/14/1976  Today's TOC FU Call Status: Today's TOC FU Call Status:: Successful TOC FU Call Competed TOC FU Call Complete Date: 02/27/23  Transition Care Management Follow-up Telephone Call Date of Discharge: 02/25/23 Discharge Facility: [facility]) Type of Discharge: Inpatient Admission Primary Inpatient Discharge Diagnosis:: sickle cell disease; symptomatic anemia How have you been since you were released from the hospital?: Better ("Doing fine; not having any issues") Any questions or concerns?: No  Items Reviewed: Did you receive and understand the discharge instructions provided?: Yes (thoroughly reviewed with patient who verbalizes good understanding of same) Medications obtained,verified, and reconciled?: Yes (Medications Reviewed) (Full medication reconciliation/ review completed; no concerns or discrepancies identified; confirmed patient obtained/ is taking all newly Rx'd medications as instructed; self-manages medications and denies questions/ concerns around medications today) Any new allergies since your discharge?: No Dietary orders reviewed?: Yes Type of Diet Ordered:: "Regular" Do you have support at home?: Yes People in Home: child(ren), adult Name of Support/Comfort Primary Source: Reports independent in self-care activities; supportive adult daughter resides with patient and assists as/ if needed/ indicated  Medications Reviewed Today: Medications Reviewed Today     Reviewed by Michaela Corner, RN (Registered Nurse) on 02/27/23 at 1613  Med List Status: <None>   Medication Order Taking? Sig Documenting Provider Last Dose Status Informant  acetaminophen (TYLENOL) 500 MG tablet [id number] Yes Take 500 mg by mouth every 6 (six) hours as needed for moderate pain or fever (pain).  [provider] Taking Active Self           Med Note (SATTERFIELD, Genoveva Ill   Wed Feb 22, 2023  6:01 PM)    albuterol  (VENTOLIN HFA) 108 (90 Base) MCG/ACT inhaler [id number]  Inhale into the lungs. [provider]  Expired 02/22/23 2359 Self           Med Note Marilu Favre Feb 27, 2023  4:08 PM) 02/27/23: reports during [facility] call does not have active Rx   Ascorbic Acid (VITAMIN C) 1000 MG tablet [id number] Yes Take 1,000 mg by mouth daily. [provider] Taking Active Self  budesonide-formoterol (SYMBICORT) 160-4.5 MCG/ACT inhaler [id number]  Inhale 2 puffs into the lungs 2 (two) times daily. [provider]  Expired 02/22/23 2359 Self           Med Note (SATTERFIELD, Genoveva Ill   Wed Feb 22, 2023  6:01 PM)    cyanocobalamin 50 MCG tablet [id number] Yes Take 100 mcg by mouth daily. [provider] Taking Active Self  cyclobenzaprine (FLEXERIL) 10 MG tablet [id number] Yes Take 1 tablet (10 mg total) by mouth 3 (three) times daily as needed for muscle spasms. [address], [place], [place] Taking Active Self           Med Note (SATTERFIELD, Genoveva Ill   Wed Feb 22, 2023  6:09 PM) Still has on hand   docusate sodium (COLACE) 100 MG capsule [id number] Yes Take 100 mg by mouth daily as needed for mild constipation. [provider] Taking Active Self           Med Note (SATTERFIELD, Genoveva Ill   Wed Feb 22, 2023  6:09 PM) Still has on hand   folic acid (FOLVITE) 1 MG tablet [id number] Yes Take 1 tablet (1 mg total) by mouth daily. Quentin Angst, MD Taking Active Self  hydroxychloroquine (PLAQUENIL) 200 MG tablet [id number] Yes Take 200 mg by mouth daily. [provider] Taking Active Self  hydroxyurea (HYDREA) 500 MG capsule [id number] Yes Take 2 capsules (1,000 mg total) by mouth daily. May take with food to minimize GI side effects. Quentin Angst, MD Taking Active Self           Med Note Montez Morita, CRYSTAL D   Thu Sep 13, 2018  5:52 AM)    INCRUSE ELLIPTA 62.5 MCG/INH AEPB [id number] Yes Inhale 1 puff into the lungs daily as needed (for shortness of breath). [provider] Taking Active Self  losartan (COZAAR) 25 MG tablet [id number] Yes Take 25 mg by mouth daily. [provider] Taking Active Self  Melatonin 10 MG TABS [id number] No Take 10 mg by mouth at bedtime.  Patient not taking: Reported on 02/27/2023   [provider] Not Taking Active Self           Med Note (SATTERFIELD, Genoveva Ill   Wed Feb 22, 2023  6:02 PM)    ondansetron (ZOFRAN) 4 MG tablet [id number] Yes Take 4 mg by mouth every 8 (eight) hours as needed for vomiting or nausea. [provider] Taking Active Self  OXBRYTA 500 MG TABS tablet [id number] Yes Take 1,500 mg by mouth at bedtime. [provider] Taking Active Self  Oxycodone HCl 20 MG TABS [id number] Yes Take 1 tablet by mouth daily as needed (For pain). [provider] Taking Active Self  pantoprazole (PROTONIX) 40 MG tablet [id number] Yes Take 1 tablet (40 mg total) by mouth daily. Beverley Fiedler, MD Taking Active Self  sodium bicarbonate 650 MG tablet [id number] Yes Take 650 mg by mouth daily. [provider] Taking Active Self  SYMBICORT 80-4.5 MCG/ACT inhaler [id number] Yes Inhale 2 puffs into the lungs daily as needed (for shortness of breath). [provider] Taking Active Self  Vitamin D, Ergocalciferol, (DRISDOL) 1.25 MG (50000 UNIT) CAPS capsule [id number] Yes TAKE 1 CAPSULE BY MOUTH EVERY 7 DAYS Ivonne Andrew, NP Taking Active Self            Home Care and Equipment/Supplies: Were Home Health Services Ordered?: No Any new equipment or medical supplies ordered?: No  Functional Questionnaire: Do you need assistance with bathing/showering or dressing?: No Do you need assistance with meal preparation?: No Do you need assistance with eating?: No Do you have difficulty maintaining continence: No Do you need assistance with getting out of bed/getting out of a chair/moving?: No Do you have difficulty managing or taking your medications?: No  Follow up  appointments reviewed: PCP Follow-up appointment confirmed?: Yes (care coordination outreach in real-time with scheduling care guide to successfully schedule hospital follow up PCP appointment 03/02/23) Date of PCP follow-up appointment?: 03/02/23 Follow-up Provider: PCP Specialist Hospital Follow-up appointment confirmed?: NA (verified not indicated per hospital discharging provider discharge notes) Do you need transportation to your follow-up appointment?: No Do you understand care options if your condition(s) worsen?: Yes-patient verbalized understanding  SDOH Interventions Today    Flowsheet Row Most Recent Value  SDOH Interventions   Food Insecurity Interventions Intervention Not Indicated  Transportation Interventions Intervention Not Indicated  [drives self]      TOC Interventions Today    Flowsheet Row Most Recent Value  TOC Interventions   TOC Interventions Discussed/Reviewed TOC Interventions Discussed, Arranged PCP follow up within 7 days/Care Guide scheduled      Interventions Today    Flowsheet Row Most Recent Value  Chronic Disease   Chronic disease during today's visit Other  [sickle cell disease]  General Interventions   General Interventions Discussed/Reviewed General Interventions Discussed, Doctor Visits, Durable Medical Equipment (DME)  Doctor Visits Discussed/Reviewed PCP, Doctor Visits Discussed  Durable Medical Equipment (DME) Other  [verified currently does not require assistive devices]  PCP/Specialist Visits Compliance with follow-up visit  Nutrition Interventions   Nutrition Discussed/Reviewed Nutrition Discussed  Pharmacy Interventions   Pharmacy Dicussed/Reviewed Pharmacy Topics Discussed  [Full medication review with updating medication list in EHR per patient report]      Caryl Pina, RN, BSN, CCRN Alumnus RN CM Care Coordination/ Transition of Care- [facility] Care Management [phone]: direct office

## 2023-03-02 ENCOUNTER — Ambulatory Visit (INDEPENDENT_AMBULATORY_CARE_PROVIDER_SITE_OTHER): Payer: Medicare HMO | Admitting: Nurse Practitioner

## 2023-03-02 ENCOUNTER — Encounter: Payer: Self-pay | Admitting: Nurse Practitioner

## 2023-03-02 VITALS — BP 117/65 | HR 75 | Temp 98.4°F | Ht 72.0 in | Wt 127.8 lb

## 2023-03-02 DIAGNOSIS — D5700 Hb-SS disease with crisis, unspecified: Secondary | ICD-10-CM

## 2023-03-02 NOTE — Assessment & Plan Note (Signed)
-   CBC - Basic Metabolic Panel  Follow up:  Follow up in 3 months

## 2023-03-02 NOTE — Progress Notes (Signed)
@Patient  ID: Shawn Adams, male    DOB: [birth year], 47 y.o.   MRN: [id number]  Chief Complaint  Patient presents with   Follow-up    Hospital follow up.     Referring provider: Ivonne Andrew, NP   HPI  Patient presents today for hospital follow-up.  He was in the hospital on 02/22/23 for sickle cell crisis.  He did have to get a blood transfusion.  We will recheck labs today.  Patient states that he is feeling much improved.  Denies f/c/s, n/v/d, hemoptysis, PND, leg swelling Denies chest pain or edema      No Known Allergies  Immunization History  Administered Date(s) Administered   HIB (PRP-T) 09/11/2013   Influenza,inj,Quad PF,6+ Mos 06/21/2014, 07/08/2015, 07/17/2017, 06/21/2018, 09/30/2019, 07/22/2021, 09/05/2022   Influenza,inj,quad, With Preservative 05/11/2016   Influenza-Unspecified 07/10/2018   Meningococcal B, OMV 11/26/2018, 01/24/2020   Meningococcal Conjugate 09/11/2013, 11/26/2018, 11/26/2018   PFIZER(Purple Top)SARS-COV-2 Vaccination 12/15/2019, 12/30/2019   Pfizer Covid-19 Vaccine Bivalent Booster 90yrs & up 07/30/2021   Pneumococcal Conjugate-13 11/19/2015   Pneumococcal Polysaccharide-23 10/02/2009, 10/23/2017   Tdap 07/08/2015   Unspecified SARS-COV-2 Vaccination 12/15/2019, 12/30/2019    Past Medical History:  Diagnosis Date   Aneurysm (HCC)    Pt states happened in 2011, Brain    Asthma    CKD (chronic kidney disease) stage 3, GFR 30-59 ml/min (HCC)    Heart murmur    Pulmonary sarcoidosis (HCC)    Sarcoid    Sickle cell anemia (HCC)     Tobacco History: Social History   Tobacco Use  Smoking Status Former   Types: Cigarettes  Smokeless Tobacco Never   Counseling given: Not Answered   Outpatient Encounter Medications as of 03/02/2023  Medication Sig   acetaminophen (TYLENOL) 500 MG tablet Take 500 mg by mouth every 6 (six) hours as needed for moderate pain or fever (pain).    Ascorbic Acid (VITAMIN C) 1000 MG tablet Take 1,000  mg by mouth daily.   budesonide-formoterol (SYMBICORT) 160-4.5 MCG/ACT inhaler Inhale 2 puffs into the lungs 2 (two) times daily.   cyanocobalamin 50 MCG tablet Take 100 mcg by mouth daily.   cyclobenzaprine (FLEXERIL) 10 MG tablet Take 1 tablet (10 mg total) by mouth 3 (three) times daily as needed for muscle spasms.   docusate sodium (COLACE) 100 MG capsule Take 100 mg by mouth daily as needed for mild constipation.   folic acid (FOLVITE) 1 MG tablet Take 1 tablet (1 mg total) by mouth daily.   hydroxychloroquine (PLAQUENIL) 200 MG tablet Take 200 mg by mouth daily.   hydroxyurea (HYDREA) 500 MG capsule Take 2 capsules (1,000 mg total) by mouth daily. May take with food to minimize GI side effects.   INCRUSE ELLIPTA 62.5 MCG/INH AEPB Inhale 1 puff into the lungs daily as needed (for shortness of breath).   losartan (COZAAR) 25 MG tablet Take 25 mg by mouth daily.   ondansetron (ZOFRAN) 4 MG tablet Take 4 mg by mouth every 8 (eight) hours as needed for vomiting or nausea.   OXBRYTA 500 MG TABS tablet Take 1,500 mg by mouth at bedtime.   Oxycodone HCl 20 MG TABS Take 1 tablet by mouth daily as needed (For pain).   pantoprazole (PROTONIX) 40 MG tablet Take 1 tablet (40 mg total) by mouth daily.   sodium bicarbonate 650 MG tablet Take 650 mg by mouth daily.   SYMBICORT 80-4.5 MCG/ACT inhaler Inhale 2 puffs into the lungs daily as needed (for shortness  of breath).   Vitamin D, Ergocalciferol, (DRISDOL) 1.25 MG (50000 UNIT) CAPS capsule TAKE 1 CAPSULE BY MOUTH EVERY 7 DAYS   albuterol (VENTOLIN HFA) 108 (90 Base) MCG/ACT inhaler Inhale into the lungs.   Melatonin 10 MG TABS Take 10 mg by mouth at bedtime. (Patient not taking: Reported on 02/27/2023)   No facility-administered encounter medications on file as of 03/02/2023.     Review of Systems  Review of Systems  Constitutional: Negative.   HENT: Negative.    Cardiovascular: Negative.   Gastrointestinal: Negative.   Allergic/Immunologic:  Negative.   Neurological: Negative.   Psychiatric/Behavioral: Negative.         Physical Exam  BP 117/65   Pulse 75   Temp 98.4 F (36.9 C)   Ht 6' (1.829 m)   Wt 127 lb 12.8 oz (58 kg)   SpO2 98%   BMI 17.33 kg/m   Wt Readings from Last 5 Encounters:  03/02/23 127 lb 12.8 oz (58 kg)  02/22/23 140 lb (63.5 kg)  11/24/22 141 lb (64 kg)  10/12/22 141 lb 6 oz (64.1 kg)  10/10/22 142 lb 6.4 oz (64.6 kg)     Physical Exam Vitals and nursing note reviewed.  Constitutional:      General: He is not in acute distress.    Appearance: He is well-developed.  Cardiovascular:     Rate and Rhythm: Normal rate and regular rhythm.  Pulmonary:     Effort: Pulmonary effort is normal.     Breath sounds: Normal breath sounds.  Skin:    General: Skin is warm and dry.  Neurological:     Mental Status: He is alert and oriented to person, place, and time.      Lab Results:  CBC    Component Value Date/Time   WBC 5.8 02/24/2023 0704   RBC 2.02 (L) 02/24/2023 0704   HGB 6.7 (LL) 02/25/2023 0939   HGB 8.8 (L) 10/10/2022 1342   HCT 18.4 (L) 02/24/2023 0704   HCT 23.5 (L) 10/10/2022 1342   PLT 286 02/24/2023 0704   PLT 378 10/10/2022 1342   MCV 91.1 02/24/2023 0704   MCV 104 (H) 10/10/2022 1342   MCH 33.2 02/24/2023 0704   MCHC 36.4 (H) 02/24/2023 0704   RDW 21.4 (H) 02/24/2023 0704   RDW 20.8 (H) 10/10/2022 1342   LYMPHSABS 1.6 02/24/2023 0704   LYMPHSABS 1.3 10/10/2022 1342   MONOABS 0.5 02/24/2023 0704   EOSABS 0.3 02/24/2023 0704   EOSABS 0.1 10/10/2022 1342   BASOSABS 0.1 02/24/2023 0704   BASOSABS 0.0 10/10/2022 1342    BMET    Component Value Date/Time   NA 138 02/24/2023 0704   NA 140 10/10/2022 1342   K 3.8 02/24/2023 0704   CL 115 (H) 02/24/2023 0704   CO2 16 (L) 02/24/2023 0704   GLUCOSE 93 02/24/2023 0704   BUN 23 (H) 02/24/2023 0704   BUN 25 (H) 10/10/2022 1342   CREATININE 1.85 (H) 02/24/2023 0704   CREATININE 1.39 (H) 05/11/2016 1100   CALCIUM  8.3 (L) 02/24/2023 0704   GFRNONAA 45 (L) 02/24/2023 0704   GFRNONAA 63 05/11/2016 1100   GFRAA 18 (L) 04/12/2020 0641   GFRAA 73 05/11/2016 1100    BNP No results found for: "BNP"  ProBNP No results found for: "PROBNP"  Imaging: CT ABDOMEN PELVIS WO CONTRAST  Result Date: 02/22/2023 CLINICAL DATA:  Abdomen pain diarrhea EXAM: CT ABDOMEN AND PELVIS WITHOUT CONTRAST TECHNIQUE: Multidetector CT imaging of the abdomen  and pelvis was performed following the standard protocol without IV contrast. RADIATION DOSE REDUCTION: This exam was performed according to the departmental dose-optimization program which includes automated exposure control, adjustment of the mA and/or kV according to patient size and/or use of iterative reconstruction technique. COMPARISON:  Ultrasound 10/18/2022, CT 04/07/2020 FINDINGS: Lower chest: Cardiomegaly. No acute airspace disease. Bibasilar scarring. Hepatobiliary: Gallstone. No biliary dilatation. Enlarged liver measuring 21 cm Pancreas: Unremarkable. No pancreatic ductal dilatation or surrounding inflammatory changes. Spleen: Diffusely calcified and atrophied spleen Adrenals/Urinary Tract: Adrenal glands are normal. Kidneys show no hydronephrosis. Bilateral cortical scarring. Punctate nonobstructing stone in the right kidney. The bladder is unremarkable. Stomach/Bowel: Stomach nonenlarged. No dilated small bowel. No acute bowel wall thickening Vascular/Lymphatic: Nonaneurysmal aorta.  No suspicious lymph nodes. Reproductive: Prostate is unremarkable. Other: Negative for pelvic effusion or free air. Musculoskeletal: No acute osseous abnormality IMPRESSION: 1. No CT evidence for acute intra-abdominal or pelvic abnormality. 2. Cardiomegaly. 3. Gallstone. 4. Hepatomegaly 5. Punctate nonobstructing right kidney stone. Electronically Signed   By: Jasmine Pang M.D.   On: 02/22/2023 16:35     Assessment & Plan:   Sickle cell anemia (HCC) - CBC - Basic Metabolic  Panel  Follow up:  Follow up in 3 months     Ivonne Andrew, NP 03/02/2023

## 2023-03-02 NOTE — Patient Instructions (Signed)
1. Sickle cell disease with crisis (HCC)  - CBC - Basic Metabolic Panel  Follow up:  Follow up in 3 months

## 2023-03-03 LAB — BASIC METABOLIC PANEL
BUN/Creatinine Ratio: 14 (ref 9–20)
BUN: 28 mg/dL — ABNORMAL HIGH (ref 6–24)
CO2: 17 mmol/L — ABNORMAL LOW (ref 20–29)
Calcium: 9.1 mg/dL (ref 8.7–10.2)
Chloride: 110 mmol/L — ABNORMAL HIGH (ref 96–106)
Creatinine, Ser: 1.96 mg/dL — ABNORMAL HIGH (ref 0.76–1.27)
Glucose: 96 mg/dL (ref 70–99)
Potassium: 4 mmol/L (ref 3.5–5.2)
Sodium: 141 mmol/L (ref 134–144)
eGFR: 42 mL/min/{1.73_m2} — ABNORMAL LOW (ref >59)

## 2023-03-03 LAB — CBC
Hematocrit: 19.6 % — ABNORMAL LOW (ref 37.5–51.0)
Hemoglobin: 7 g/dL — CL (ref 13.0–17.7)
MCH: 33.3 pg — ABNORMAL HIGH (ref 26.6–33.0)
MCHC: 35.7 g/dL (ref 31.5–35.7)
MCV: 93 fL (ref 79–97)
NRBC: 3 % — ABNORMAL HIGH (ref 0–0)
Platelets: 371 10*3/uL (ref 150–450)
RBC: 2.1 x10E6/uL — CL (ref 4.14–5.80)
RDW: 18.1 % — ABNORMAL HIGH (ref 11.6–15.4)
WBC: 5.2 10*3/uL (ref 3.4–10.8)

## 2023-03-09 DIAGNOSIS — D571 Sickle-cell disease without crisis: Secondary | ICD-10-CM | POA: Diagnosis not present

## 2023-03-09 DIAGNOSIS — G8929 Other chronic pain: Secondary | ICD-10-CM | POA: Diagnosis not present

## 2023-03-09 DIAGNOSIS — E559 Vitamin D deficiency, unspecified: Secondary | ICD-10-CM | POA: Diagnosis not present

## 2023-03-09 DIAGNOSIS — T8089XA Other complications following infusion, transfusion and therapeutic injection, initial encounter: Secondary | ICD-10-CM | POA: Diagnosis not present

## 2023-03-23 ENCOUNTER — Ambulatory Visit (INDEPENDENT_AMBULATORY_CARE_PROVIDER_SITE_OTHER): Payer: Medicare HMO

## 2023-03-23 VITALS — Ht 73.0 in | Wt 140.0 lb

## 2023-03-23 DIAGNOSIS — Z1159 Encounter for screening for other viral diseases: Secondary | ICD-10-CM

## 2023-03-23 DIAGNOSIS — Z0000 Encounter for general adult medical examination without abnormal findings: Secondary | ICD-10-CM | POA: Diagnosis not present

## 2023-03-23 NOTE — Patient Instructions (Signed)
[name] , Thank you for taking time to come for your Medicare Wellness Visit. I appreciate your ongoing commitment to your health goals. Please review the following plan we discussed and let me know if I can assist you in the future.   These are the goals we discussed:  Goals   None     This is a list of the screening recommended for you and due dates:  Health Maintenance  Topic Date Due   Medicare Annual Wellness Visit  Never done   Hepatitis C Screening  Never done   COVID-19 Vaccine (6 - 2023-24 season) 09/22/2023*   Flu Shot  04/27/2023   DTaP/Tdap/Td vaccine (2 - Td or Tdap) 07/07/2025   Colon Cancer Screening  11/23/2032   HIV Screening  Completed   HPV Vaccine  Aged Out   Cologuard (Stool DNA test)  Discontinued  *Topic was postponed. The date shown is not the original due date.    Advanced directives: Advance directive discussed with you today. Even though you declined this today, please call our office should you change your mind, and we can give you the proper paperwork for you to fill out. Advance care planning is a way to make decisions about medical care that fits your values in case you are ever unable to make these decisions for yourself.  Information on Advanced Care Planning can be found at [facility] Advance Health Care Directives Advance Health Care Directives ([email or web address]    Conditions/risks identified:  Hepatitis C Screening has been ordered. Please have that drawn.   Managing Pain Without Opioids Opioids are strong medicines used to treat moderate to severe pain. For some people, especially those who have long-term (chronic) pain, opioids may not be the best choice for pain management due to: Side effects like nausea, constipation, and sleepiness. The risk of addiction (opioid use disorder). The longer you take opioids, the greater your risk of addiction. Pain that lasts for more than 3 months is called chronic pain. Managing chronic  pain usually requires more than one approach and is often provided by a team of health care providers working together (multidisciplinary approach). Pain management may be done at a pain management center or pain clinic. How to manage pain without the use of opioids Use non-opioid medicines Non-opioid medicines for pain may include: Over-the-counter or prescription non-steroidal anti-inflammatory drugs (NSAIDs). These may be the first medicines used for pain. They work well for muscle and bone pain, and they reduce swelling. Acetaminophen. This over-the-counter medicine may work well for milder pain but not swelling. Antidepressants. These may be used to treat chronic pain. A certain type of antidepressant (tricyclics) is often used. These medicines are given in lower doses for pain than when used for depression. Anticonvulsants. These are usually used to treat seizures but may also reduce nerve (neuropathic) pain. Muscle relaxants. These relieve pain caused by sudden muscle tightening (spasms). You may also use a pain medicine that is applied to the skin as a patch, cream, or gel (topical analgesic), such as a numbing medicine. These may cause fewer side effects than medicines taken by mouth. Do certain therapies as directed Some therapies can help with pain management. They include: Physical therapy. You will do exercises to gain strength and flexibility. A physical therapist may teach you exercises to move and stretch parts of your body that are weak, stiff, or painful. You can learn these exercises at physical therapy visits and practice them at home. Physical therapy  may also involve: Massage. Heat wraps or applying heat or cold to affected areas. Electrical signals that interrupt pain signals (transcutaneous electrical nerve stimulation, TENS). Weak lasers that reduce pain and swelling (low-level laser therapy). Signals from your body that help you learn to regulate pain  (biofeedback). Occupational therapy. This helps you to learn ways to function at home and work with less pain. Recreational therapy. This involves trying new activities or hobbies, such as a physical activity or drawing. Mental health therapy, including: Cognitive behavioral therapy (CBT). This helps you learn coping skills for dealing with pain. Acceptance and commitment therapy (ACT) to change the way you think and react to pain. Relaxation therapies, including muscle relaxation exercises and mindfulness-based stress reduction. Pain management counseling. This may be individual, family, or group counseling.  Receive medical treatments Medical treatments for pain management include: Nerve block injections. These may include a pain blocker and anti-inflammatory medicines. You may have injections: Near the spine to relieve chronic back or neck pain. Into joints to relieve back or joint pain. Into nerve areas that supply a painful area to relieve body pain. Into muscles (trigger point injections) to relieve some painful muscle conditions. A medical device placed near your spine to help block pain signals and relieve nerve pain or chronic back pain (spinal cord stimulation device). Acupuncture. Follow these instructions at home Medicines Take over-the-counter and prescription medicines only as told by your health care provider. If you are taking pain medicine, ask your health care providers about possible side effects to watch out for. Do not drive or use heavy machinery while taking prescription opioid pain medicine. Lifestyle  Do not use drugs or alcohol to reduce pain. If you drink alcohol, limit how much you have to: 0-1 drink a day for women who are not pregnant. 0-2 drinks a day for men. Know how much alcohol is in a drink. In the [place] one drink equals one 12 oz bottle of beer (355 mL), one 5 oz glass of wine (148 mL), or one 1 oz glass of hard liquor (44 mL). Do not use any  products that contain nicotine or tobacco. These products include cigarettes, chewing tobacco, and vaping devices, such as e-cigarettes. If you need help quitting, ask your health care provider. Eat a healthy diet and maintain a healthy weight. Poor diet and excess weight may make pain worse. Eat foods that are high in fiber. These include fresh fruits and vegetables, whole grains, and beans. Limit foods that are high in fat and processed sugars, such as fried and sweet foods. Exercise regularly. Exercise lowers stress and may help relieve pain. Ask your health care provider what activities and exercises are safe for you. If your health care provider approves, join an exercise class that combines movement and stress reduction. Examples include yoga and tai chi. Get enough sleep. Lack of sleep may make pain worse. Lower stress as much as possible. Practice stress reduction techniques as told by your therapist. General instructions Work with all your pain management providers to find the treatments that work best for you. You are an important member of your pain management team. There are many things you can do to reduce pain on your own. Consider joining an online or in-person support group for people who have chronic pain. Keep all follow-up visits. This is important. Where to find more information You can find more information about managing pain without opioids from: American Academy of Pain Medicine: painmed.org Institute for Chronic Pain: instituteforchronicpain.org American Chronic  Pain Association: theacpa.org Contact a health care provider if: You have side effects from pain medicine. Your pain gets worse or does not get better with treatments or home therapy. You are struggling with anxiety or depression. Summary Many types of pain can be managed without opioids. Chronic pain may respond better to pain management without opioids. Pain is best managed when you and a team of health care  providers work together. Pain management without opioids may include non-opioid medicines, medical treatments, physical therapy, mental health therapy, and lifestyle changes. Tell your health care providers if your pain gets worse or is not being managed well enough. This information is not intended to replace advice given to you by your health care provider. Make sure you discuss any questions you have with your health care provider. Document Revised: 12/23/2020 Document Reviewed: 12/23/2020 Elsevier Patient Education  2024 Elsevier Inc.   Next appointment: VIRTUAL/ TELEPHONE VISIT Follow up in one year for your annual wellness visit  March 28, 2024 at 9:30am telephone visit   Preventive Care 40-64 Years, Male Preventive care refers to lifestyle choices and visits with your health care provider that can promote health and wellness. What does preventive care include? A yearly physical exam. This is also called an annual well check. Dental exams once or twice a year. Routine eye exams. Ask your health care provider how often you should have your eyes checked. Personal lifestyle choices, including: Daily care of your teeth and gums. Regular physical activity. Eating a healthy diet. Avoiding tobacco and drug use. Limiting alcohol use. Practicing safe sex. Taking low-dose aspirin every day starting at age 61. What happens during an annual well check? The services and screenings done by your health care provider during your annual well check will depend on your age, overall health, lifestyle risk factors, and family history of disease. Counseling  Your health care provider may ask you questions about your: Alcohol use. Tobacco use. Drug use. Emotional well-being. Home and relationship well-being. Sexual activity. Eating habits. Work and work Astronomer. Screening  You may have the following tests or measurements: Height, weight, and BMI. Blood pressure. Lipid and cholesterol  levels. These may be checked every 5 years, or more frequently if you are over 51 years old. Skin check. Lung cancer screening. You may have this screening every year starting at age 4 if you have a 30-pack-year history of smoking and currently smoke or have quit within the past 15 years. Fecal occult blood test (FOBT) of the stool. You may have this test every year starting at age 12. Flexible sigmoidoscopy or colonoscopy. You may have a sigmoidoscopy every 5 years or a colonoscopy every 10 years starting at age 68. Prostate cancer screening. Recommendations will vary depending on your family history and other risks. Hepatitis C blood test. Hepatitis B blood test. Sexually transmitted disease (STD) testing. Diabetes screening. This is done by checking your blood sugar (glucose) after you have not eaten for a while (fasting). You may have this done every 1-3 years. Discuss your test results, treatment options, and if necessary, the need for more tests with your health care provider. Vaccines  Your health care provider may recommend certain vaccines, such as: Influenza vaccine. This is recommended every year. Tetanus, diphtheria, and acellular pertussis (Tdap, Td) vaccine. You may need a Td booster every 10 years. Zoster vaccine. You may need this after age 14. Pneumococcal 13-valent conjugate (PCV13) vaccine. You may need this if you have certain conditions and have not been vaccinated.  Pneumococcal polysaccharide (PPSV23) vaccine. You may need one or two doses if you smoke cigarettes or if you have certain conditions. Talk to your health care provider about which screenings and vaccines you need and how often you need them. This information is not intended to replace advice given to you by your health care provider. Make sure you discuss any questions you have with your health care provider. Document Released: 10/09/2015 Document Revised: 06/01/2016 Document Reviewed: 07/14/2015 Elsevier  Interactive Patient Education  2017 [employer].  Fall Prevention in the Home Falls can cause injuries. They can happen to people of all ages. There are many things you can do to make your home safe and to help prevent falls. What can I do on the outside of my home? Regularly fix the edges of walkways and driveways and fix any cracks. Remove anything that might make you trip as you walk through a door, such as a raised step or threshold. Trim any bushes or trees on the path to your home. Use bright outdoor lighting. Clear any walking paths of anything that might make someone trip, such as rocks or tools. Regularly check to see if handrails are loose or broken. Make sure that both sides of any steps have handrails. Any raised decks and porches should have guardrails on the edges. Have any leaves, snow, or ice cleared regularly. Use sand or salt on walking paths during winter. Clean up any spills in your garage right away. This includes oil or grease spills. What can I do in the bathroom? Use night lights. Install grab bars by the toilet and in the tub and shower. Do not use towel bars as grab bars. Use non-skid mats or decals in the tub or shower. If you need to sit down in the shower, use a plastic, non-slip stool. Keep the floor dry. Clean up any water that spills on the floor as soon as it happens. Remove soap buildup in the tub or shower regularly. Attach bath mats securely with double-sided non-slip rug tape. Do not have throw rugs and other things on the floor that can make you trip. What can I do in the bedroom? Use night lights. Make sure that you have a light by your bed that is easy to reach. Do not use any sheets or blankets that are too big for your bed. They should not hang down onto the floor. Have a firm chair that has side arms. You can use this for support while you get dressed. Do not have throw rugs and other things on the floor that can make you trip. What can I do  in the kitchen? Clean up any spills right away. Avoid walking on wet floors. Keep items that you use a lot in easy-to-reach places. If you need to reach something above you, use a strong step stool that has a grab bar. Keep electrical cords out of the way. Do not use floor polish or wax that makes floors slippery. If you must use wax, use non-skid floor wax. Do not have throw rugs and other things on the floor that can make you trip. What can I do with my stairs? Do not leave any items on the stairs. Make sure that there are handrails on both sides of the stairs and use them. Fix handrails that are broken or loose. Make sure that handrails are as long as the stairways. Check any carpeting to make sure that it is firmly attached to the stairs. Fix any carpet that  is loose or worn. Avoid having throw rugs at the top or bottom of the stairs. If you do have throw rugs, attach them to the floor with carpet tape. Make sure that you have a light switch at the top of the stairs and the bottom of the stairs. If you do not have them, ask someone to add them for you. What else can I do to help prevent falls? Wear shoes that: Do not have high heels. Have rubber bottoms. Are comfortable and fit you well. Are closed at the toe. Do not wear sandals. If you use a stepladder: Make sure that it is fully opened. Do not climb a closed stepladder. Make sure that both sides of the stepladder are locked into place. Ask someone to hold it for you, if possible. Clearly mark and make sure that you can see: Any grab bars or handrails. First and last steps. Where the edge of each step is. Use tools that help you move around (mobility aids) if they are needed. These include: Canes. Walkers. Scooters. Crutches. Turn on the lights when you go into a dark area. Replace any light bulbs as soon as they burn out. Set up your furniture so you have a clear path. Avoid moving your furniture around. If any of your  floors are uneven, fix them. If there are any pets around you, be aware of where they are. Review your medicines with your doctor. Some medicines can make you feel dizzy. This can increase your chance of falling. Ask your doctor what other things that you can do to help prevent falls. This information is not intended to replace advice given to you by your health care provider. Make sure you discuss any questions you have with your health care provider. Document Released: 07/09/2009 Document Revised: 02/18/2016 Document Reviewed: 10/17/2014 Elsevier Interactive Patient Education  2017 [employer].

## 2023-03-23 NOTE — Progress Notes (Signed)
Subjective:   Shawn Adams is a 47 y.o. male who presents for an Initial Medicare Annual Wellness Visit.  Visit Complete: Virtual  I connected with  Shawn Adams on 03/23/23 by a audio enabled telemedicine application and verified that I am speaking with the correct person using two identifiers.  Patient Location: Home  Provider Location: Home Office  I discussed the limitations of evaluation and management by telemedicine. The patient expressed understanding and agreed to proceed.  Patient Medicare AWV questionnaire was completed by the patient on n/a; I have confirmed that all information answered by patient is correct and no changes since this date.  Review of Systems     Cardiac Risk Factors include: male gender;Other (see comment);hypertension, Risk factor comments: sickle cell disease     Objective:    Today's Vitals   03/23/23 1057  Weight: 140 lb (63.5 kg)  Height: 6\' 1"  (1.854 m)   Body mass index is 18.47 kg/m.     03/23/2023   11:06 AM 02/22/2023    2:27 PM 10/16/2021    1:00 AM 07/11/2020   12:08 AM 04/07/2020    1:43 PM 09/26/2019    5:59 PM 08/01/2019    8:46 PM  Advanced Directives  Does Patient Have a Medical Advance Directive? No No No No No No No  Would patient like information on creating a medical advance directive? No - Patient declined No - Patient declined No - Patient declined No - Patient declined No - Patient declined      Current Medications (verified) Outpatient Encounter Medications as of 03/23/2023  Medication Sig   acetaminophen (TYLENOL) 500 MG tablet Take 500 mg by mouth every 6 (six) hours as needed for moderate pain or fever (pain).    albuterol (VENTOLIN HFA) 108 (90 Base) MCG/ACT inhaler Inhale into the lungs.   Ascorbic Acid (VITAMIN C) 1000 MG tablet Take 1,000 mg by mouth daily.   budesonide-formoterol (SYMBICORT) 160-4.5 MCG/ACT inhaler Inhale 2 puffs into the lungs 2 (two) times daily.   cyanocobalamin 50 MCG tablet Take 100  mcg by mouth daily.   cyclobenzaprine (FLEXERIL) 10 MG tablet Take 1 tablet (10 mg total) by mouth 3 (three) times daily as needed for muscle spasms.   docusate sodium (COLACE) 100 MG capsule Take 100 mg by mouth daily as needed for mild constipation.   folic acid (FOLVITE) 1 MG tablet Take 1 tablet (1 mg total) by mouth daily.   hydroxychloroquine (PLAQUENIL) 200 MG tablet Take 200 mg by mouth daily.   hydroxyurea (HYDREA) 500 MG capsule Take 2 capsules (1,000 mg total) by mouth daily. May take with food to minimize GI side effects.   INCRUSE ELLIPTA 62.5 MCG/INH AEPB Inhale 1 puff into the lungs daily as needed (for shortness of breath).   losartan (COZAAR) 25 MG tablet Take 25 mg by mouth daily.   ondansetron (ZOFRAN) 4 MG tablet Take 4 mg by mouth every 8 (eight) hours as needed for vomiting or nausea.   OXBRYTA 500 MG TABS tablet Take 1,500 mg by mouth at bedtime.   Oxycodone HCl 20 MG TABS Take 1 tablet by mouth daily as needed (For pain).   pantoprazole (PROTONIX) 40 MG tablet Take 1 tablet (40 mg total) by mouth daily.   sodium bicarbonate 650 MG tablet Take 650 mg by mouth daily.   SYMBICORT 80-4.5 MCG/ACT inhaler Inhale 2 puffs into the lungs daily as needed (for shortness of breath).   Vitamin D, Ergocalciferol, (DRISDOL) 1.25 MG (50000  UNIT) CAPS capsule TAKE 1 CAPSULE BY MOUTH EVERY 7 DAYS   Melatonin 10 MG TABS Take 10 mg by mouth at bedtime. (Patient not taking: Reported on 02/27/2023)   No facility-administered encounter medications on file as of 03/23/2023.    Allergies (verified) Patient has no known allergies.   History: Past Medical History:  Diagnosis Date   Aneurysm (HCC)    Pt states happened in 2011, Brain    Asthma    CKD (chronic kidney disease) stage 3, GFR 30-59 ml/min (HCC)    Heart murmur    Pulmonary sarcoidosis (HCC)    Sarcoid    Sickle cell anemia (HCC)    Past Surgical History:  Procedure Laterality Date   LYMPH NODE BIOPSY     NASAL SINUS SURGERY      Family History  Problem Relation Age of Onset   Diabetes Mother    Hypertension Father    Lupus Sister    Colon cancer Neg Hx    Esophageal cancer Neg Hx    Stomach cancer Neg Hx    Rectal cancer Neg Hx    Social History   Socioeconomic History   Marital status: Single    Spouse name: Not on file   Number of children: Not on file   Years of education: Not on file   Highest education level: Not on file  Occupational History   Not on file  Tobacco Use   Smoking status: Former    Types: Cigarettes   Smokeless tobacco: Never  Vaping Use   Vaping Use: Never used  Substance and Sexual Activity   Alcohol use: Not Currently    Alcohol/week: 1.0 standard drink of alcohol    Types: 1 Cans of beer per week    Comment: occasionally   Drug use: Not Currently    Types: Marijuana   Sexual activity: Yes    Birth control/protection: None  Other Topics Concern   Not on file  Social History Narrative   Not on file   Social Determinants of Health   Financial Resource Strain: Low Risk  (03/23/2023)   Overall Financial Resource Strain (CARDIA)    Difficulty of Paying Living Expenses: Not hard at all  Food Insecurity: No Food Insecurity (03/23/2023)   Hunger Vital Sign    Worried About Running Out of Food in the Last Year: Never true    Ran Out of Food in the Last Year: Never true  Transportation Needs: No Transportation Needs (03/23/2023)   PRAPARE - Administrator, Civil Service (Medical): No    Lack of Transportation (Non-Medical): No  Physical Activity: Sufficiently Active (03/23/2023)   Exercise Vital Sign    Days of Exercise per Week: 7 days    Minutes of Exercise per Session: 30 min  Stress: No Stress Concern Present (03/23/2023)   [employer] of Occupational Health - Occupational Stress Questionnaire    Feeling of Stress : Not at all  Social Connections: Socially Integrated (03/23/2023)   Social Connection and Isolation Panel [NHANES]    Frequency of  Communication with Friends and Family: More than three times a week    Frequency of Social Gatherings with Friends and Family: More than three times a week    Attends Religious Services: More than 4 times per year    Active Member of [employer] or Organizations: Yes    Attends Engineer, structural: More than 4 times per year    Marital Status: Married    Tobacco  Counseling Counseling given: Yes   Clinical Intake:  Pre-visit preparation completed: Yes  Pain : No/denies pain     BMI - recorded: 18.47 Nutritional Status: BMI <19  Underweight Nutritional Risks: None Diabetes: No  How often do you need to have someone help you when you read instructions, pamphlets, or other written materials from your doctor or pharmacy?: 1 - Never  Interpreter Needed?: No  Information entered by :: Abby Lynleigh Kovack, CMA   Activities of Daily Living    03/23/2023   10:59 AM 02/23/2023    3:10 AM  In your present state of health, do you have any difficulty performing the following activities:  Hearing? 0 0  Vision? 0 0  Difficulty concentrating or making decisions? 0 0  Walking or climbing stairs? 0 0  Dressing or bathing? 0 0  Doing errands, shopping? 0 0  Preparing Food and eating ? N   Using the Toilet? N   In the past six months, have you accidently leaked urine? N   Do you have problems with loss of bowel control? N   Managing your Medications? N   Managing your Finances? N   Housekeeping or managing your Housekeeping? N     Patient Care Team: Ivonne Andrew, NP as PCP - General (Pulmonary Disease)  Indicate any recent Medical Services you may have received from other than [facility] providers in the past year (date may be approximate).     Assessment:   This is a routine wellness examination for [facility].  Hearing/Vision screen Hearing Screening - Comments:: Patient denies any hearing difficulties.    Dietary issues and exercise activities discussed:     Goals Addressed              This Visit's Progress    Patient Stated       Patient states his goal is to get healthier        Depression Screen    03/23/2023   11:03 AM 03/02/2023    1:28 PM 09/05/2022    2:44 PM 01/07/2022   11:47 AM 07/09/2021    3:10 PM 03/18/2021    2:16 PM 05/28/2020    2:26 PM  PHQ 2/9 Scores  PHQ - 2 Score 0 0 0 0 0 0 0  Exception Documentation       Medical reason    Fall Risk    03/23/2023   11:06 AM 03/02/2023    1:28 PM 07/08/2022   11:16 AM 01/07/2022   11:47 AM 07/09/2021    3:10 PM  Fall Risk   Falls in the past year? 0 0 0 0 0  Number falls in past yr: 0 0 0 0 0  Injury with Fall? 0 0 0 0 0  Risk for fall due to : No Fall Risks No Fall Risks  No Fall Risks   Follow up Falls prevention discussed Falls evaluation completed Falls evaluation completed Falls evaluation completed     MEDICARE RISK AT HOME:  Medicare Risk at Home - 03/23/23 1106     Any stairs in or around the home? No    If so, are there any without handrails? No    Home free of loose throw rugs in walkways, pet beds, electrical cords, etc? Yes    Adequate lighting in your home to reduce risk of falls? Yes    Life alert? No    Use of a cane, walker or w/c? No    Grab bars in the bathroom?  Yes    Shower chair or bench in shower? Yes    Elevated toilet seat or a handicapped toilet? No             TIMED UP AND GO:  Was the test performed? No    Cognitive Function:        03/23/2023   11:07 AM  6CIT Screen  What Year? 0 points  What month? 0 points  What time? 0 points  Count back from 20 0 points  Months in reverse 0 points  Repeat phrase 0 points  Total Score 0 points    Immunizations Immunization History  Administered Date(s) Administered   HIB (PRP-T) 09/11/2013   Influenza,inj,Quad PF,6+ Mos 06/21/2014, 07/08/2015, 07/17/2017, 06/21/2018, 09/30/2019, 07/22/2021, 09/05/2022   Influenza,inj,quad, With Preservative 05/11/2016   Influenza-Unspecified 07/10/2018    Meningococcal B, OMV 11/26/2018, 01/24/2020   Meningococcal Conjugate 09/11/2013, 11/26/2018, 11/26/2018   PFIZER(Purple Top)SARS-COV-2 Vaccination 12/15/2019, 12/30/2019   Pfizer Covid-19 Vaccine Bivalent Booster 16yrs & up 07/30/2021   Pneumococcal Conjugate-13 11/19/2015   Pneumococcal Polysaccharide-23 10/02/2009, 10/23/2017   Tdap 07/08/2015   Unspecified SARS-COV-2 Vaccination 12/15/2019, 12/30/2019    TDAP status: Up to date  Flu Vaccine status: Up to date  Pneumococcal vaccine status: NOT AGE APPROPRIATE FOR THIS PATIENT  Covid-19 vaccine status: Information provided on how to obtain vaccines.   Qualifies for Shingles Vaccine? No    Screening Tests Health Maintenance  Topic Date Due   Medicare Annual Wellness (AWV)  Never done   Hepatitis C Screening  Never done   COVID-19 Vaccine (6 - 2023-24 season) 09/22/2023 (Originally 05/27/2022)   INFLUENZA VACCINE  04/27/2023   DTaP/Tdap/Td (2 - Td or Tdap) 07/07/2025   Colonoscopy  11/23/2032   HIV Screening  Completed   HPV VACCINES  Aged Out   Fecal DNA (Cologuard)  Discontinued    Health Maintenance  Health Maintenance Due  Topic Date Due   Medicare Annual Wellness (AWV)  Never done   Hepatitis C Screening  Never done    Colorectal cancer screening: Type of screening: Colonoscopy. Completed 11/24/2022. Repeat every 10 years  Lung Cancer Screening: (Low Dose CT Chest recommended if Age 44-80 years, 20 pack-year currently smoking OR have quit w/in 15years.) does not qualify.   Lung Cancer Screening Referral: n/a  Additional Screening:  Hepatitis C Screening: does qualify; Ordered 03/23/2023  Vision Screening: Recommended annual ophthalmology exams for early detection of glaucoma and other disorders of the eye. Is the patient up to date with their annual eye exam?  Yes  Who is the provider or what is the name of the office in which the patient attends annual eye exams? [facility] If pt is not established with a provider,  would they like to be referred to a provider to establish care? No .   Dental Screening: Recommended annual dental exams for proper oral hygiene  Diabetic Foot Exam: na  Community Resource Referral / Chronic Care Management: CRR required this visit?  No   CCM required this visit?  No    Plan:     I have personally reviewed and noted the following in the patient's chart:   Medical and social history Use of alcohol, tobacco or illicit drugs  Current medications and supplements including opioid prescriptions. Patient is currently taking opioid prescriptions. Information provided to patient regarding non-opioid alternatives. Patient advised to discuss non-opioid treatment plan with their provider. Functional ability and status Nutritional status Physical activity Advanced directives List of other physicians Hospitalizations, surgeries,  and ER visits in previous 12 months Vitals Screenings to include cognitive, depression, and falls Referrals and appointments  In addition, I have reviewed and discussed with patient certain preventive protocols, quality metrics, and best practice recommendations. A written personalized care plan for preventive services as well as general preventive health recommendations were provided to patient.   Any medications not marked as taking were not mentioned by the patient (or their caregiver if applicable) when reconciling the medications.  Because this visit was a virtual/telehealth visit,  certain criteria was not obtained, such a blood pressure, CBG if patient is a diabetic, and timed up and go.    Jordan Hawks Tarence Searcy, CMA   03/23/2023   After Visit Summary: (MyChart) Due to this being a telephonic visit, the after visit summary with patients personalized plan was offered to patient via MyChart   Nurse Notes: Hep C ordered

## 2023-04-25 DIAGNOSIS — D571 Sickle-cell disease without crisis: Secondary | ICD-10-CM | POA: Diagnosis not present

## 2023-04-29 DIAGNOSIS — F4323 Adjustment disorder with mixed anxiety and depressed mood: Secondary | ICD-10-CM | POA: Diagnosis not present

## 2023-05-16 DIAGNOSIS — H52209 Unspecified astigmatism, unspecified eye: Secondary | ICD-10-CM | POA: Diagnosis not present

## 2023-05-16 DIAGNOSIS — H5213 Myopia, bilateral: Secondary | ICD-10-CM | POA: Diagnosis not present

## 2023-05-18 DIAGNOSIS — N1832 Chronic kidney disease, stage 3b: Secondary | ICD-10-CM | POA: Diagnosis not present

## 2023-06-02 ENCOUNTER — Encounter: Payer: Self-pay | Admitting: Nurse Practitioner

## 2023-06-02 ENCOUNTER — Ambulatory Visit (INDEPENDENT_AMBULATORY_CARE_PROVIDER_SITE_OTHER): Payer: Medicare HMO | Admitting: Nurse Practitioner

## 2023-06-02 VITALS — BP 127/72 | HR 88 | Ht 73.0 in | Wt 125.0 lb

## 2023-06-02 DIAGNOSIS — D571 Sickle-cell disease without crisis: Secondary | ICD-10-CM

## 2023-06-02 DIAGNOSIS — Z23 Encounter for immunization: Secondary | ICD-10-CM

## 2023-06-02 NOTE — Patient Instructions (Addendum)
1. Need for influenza vaccination  - Flu vaccine trivalent PF, 6mos and older(Flulaval,Afluria,Fluarix,Fluzone)  2. Hb-SS disease without crisis (HCC)  - Sickle Cell Panel    Follow up:  Follow up in 3 months

## 2023-06-02 NOTE — Progress Notes (Signed)
@Patient  ID: Shawn Adams, male    DOB: 08-24-1976, 46 y.o.   MRN: [id number]  Chief Complaint  Patient presents with   Follow-up    Referring provider: Ivonne Andrew, NP   HPI  Shawn Adams 47 y.o. male  has a past medical history of Aneurysm (HCC), Heart murmur, Sarcoid, and Sickle cell anemia (HCC). To the [facility] for abdominal pain.    Patient presents today for a follow-up visit.  He is seen by pulmonary through [facility] for sarcoidosis and he is also seen through hematology through [facility] for sickle cell disease.  Overall patient has been doing well with no new issues or concerns.  Denies f/c/s, n/v/d, hemoptysis, PND, leg swelling Denies chest pain or edema     No Known Allergies  Immunization History  Administered Date(s) Administered   HIB (PRP-T) 09/11/2013   Influenza, Seasonal, Injecte, Preservative Fre 06/02/2023   Influenza,inj,Quad PF,6+ Mos 06/21/2014, 07/08/2015, 07/17/2017, 06/21/2018, 09/30/2019, 07/22/2021, 09/05/2022   Influenza,inj,quad, With Preservative 05/11/2016   Influenza-Unspecified 07/10/2018   Meningococcal B, OMV 11/26/2018, 01/24/2020   Meningococcal Conjugate 09/11/2013, 11/26/2018, 11/26/2018   PFIZER(Purple Top)SARS-COV-2 Vaccination 12/15/2019, 12/30/2019   Pfizer Covid-19 Vaccine Bivalent Booster 54yrs & up 07/30/2021   Pneumococcal Conjugate-13 11/19/2015   Pneumococcal Polysaccharide-23 10/02/2009, 10/23/2017   Tdap 07/08/2015   Unspecified SARS-COV-2 Vaccination 12/15/2019, 12/30/2019    Past Medical History:  Diagnosis Date   Aneurysm (HCC)    Pt states happened in 2011, Brain    Asthma    CKD (chronic kidney disease) stage 3, GFR 30-59 ml/min (HCC)    Heart murmur    Pulmonary sarcoidosis (HCC)    Sarcoid    Sickle cell anemia (HCC)     Tobacco History: Social History   Tobacco Use  Smoking Status Former   Types: Cigarettes  Smokeless Tobacco Never   Counseling given: Not Answered   Outpatient  Encounter Medications as of 06/02/2023  Medication Sig   acetaminophen (TYLENOL) 500 MG tablet Take 500 mg by mouth every 6 (six) hours as needed for moderate pain or fever (pain).    Ascorbic Acid (VITAMIN C) 1000 MG tablet Take 1,000 mg by mouth daily.   cyanocobalamin 50 MCG tablet Take 100 mcg by mouth daily.   cyclobenzaprine (FLEXERIL) 10 MG tablet Take 1 tablet (10 mg total) by mouth 3 (three) times daily as needed for muscle spasms.   docusate sodium (COLACE) 100 MG capsule Take 100 mg by mouth daily as needed for mild constipation.   folic acid (FOLVITE) 1 MG tablet Take 1 tablet (1 mg total) by mouth daily.   hydroxychloroquine (PLAQUENIL) 200 MG tablet Take 200 mg by mouth daily.   hydroxyurea (HYDREA) 500 MG capsule Take 2 capsules (1,000 mg total) by mouth daily. May take with food to minimize GI side effects.   INCRUSE ELLIPTA 62.5 MCG/INH AEPB Inhale 1 puff into the lungs daily as needed (for shortness of breath).   losartan (COZAAR) 25 MG tablet Take 25 mg by mouth daily.   Melatonin 10 MG TABS Take 10 mg by mouth at bedtime.   ondansetron (ZOFRAN) 4 MG tablet Take 4 mg by mouth every 8 (eight) hours as needed for vomiting or nausea.   OXBRYTA 500 MG TABS tablet Take 1,500 mg by mouth at bedtime.   Oxycodone HCl 20 MG TABS Take 1 tablet by mouth daily as needed (For pain).   pantoprazole (PROTONIX) 40 MG tablet Take 1 tablet (40 mg total) by mouth daily.  sodium bicarbonate 650 MG tablet Take 650 mg by mouth daily.   SYMBICORT 80-4.5 MCG/ACT inhaler Inhale 2 puffs into the lungs daily as needed (for shortness of breath).   Vitamin D, Ergocalciferol, (DRISDOL) 1.25 MG (50000 UNIT) CAPS capsule TAKE 1 CAPSULE BY MOUTH EVERY 7 DAYS   albuterol (VENTOLIN HFA) 108 (90 Base) MCG/ACT inhaler Inhale into the lungs.   budesonide-formoterol (SYMBICORT) 160-4.5 MCG/ACT inhaler Inhale 2 puffs into the lungs 2 (two) times daily.   No facility-administered encounter medications on file as of  06/02/2023.     Review of Systems  Review of Systems  Constitutional: Negative.   HENT: Negative.    Cardiovascular: Negative.   Gastrointestinal: Negative.   Allergic/Immunologic: Negative.   Neurological: Negative.   Psychiatric/Behavioral: Negative.         Physical Exam  BP 127/72 (BP Location: Right Arm, Patient Position: Sitting)   Pulse 88   Ht 6\' 1"  (1.854 m)   Wt 125 lb (56.7 kg)   SpO2 94%   BMI 16.49 kg/m   Wt Readings from Last 5 Encounters:  06/02/23 125 lb (56.7 kg)  03/23/23 140 lb (63.5 kg)  03/02/23 127 lb 12.8 oz (58 kg)  02/22/23 140 lb (63.5 kg)  11/24/22 141 lb (64 kg)     Physical Exam Vitals and nursing note reviewed.  Constitutional:      General: He is not in acute distress.    Appearance: He is well-developed.  Cardiovascular:     Rate and Rhythm: Normal rate and regular rhythm.  Pulmonary:     Effort: Pulmonary effort is normal.     Breath sounds: Normal breath sounds.  Skin:    General: Skin is warm and dry.  Neurological:     Mental Status: He is alert and oriented to person, place, and time.      Lab Results:  CBC    Component Value Date/Time   WBC 5.2 03/02/2023 1354   WBC 5.8 02/24/2023 0704   RBC 2.10 (LL) 03/02/2023 1354   RBC 2.02 (L) 02/24/2023 0704   HGB 7.0 (LL) 03/02/2023 1354   HCT 19.6 (L) 03/02/2023 1354   PLT 371 03/02/2023 1354   MCV 93 03/02/2023 1354   MCH 33.3 (H) 03/02/2023 1354   MCH 33.2 02/24/2023 0704   MCHC 35.7 03/02/2023 1354   MCHC 36.4 (H) 02/24/2023 0704   RDW 18.1 (H) 03/02/2023 1354   LYMPHSABS 1.6 02/24/2023 0704   LYMPHSABS 1.3 10/10/2022 1342   MONOABS 0.5 02/24/2023 0704   EOSABS 0.3 02/24/2023 0704   EOSABS 0.1 10/10/2022 1342   BASOSABS 0.1 02/24/2023 0704   BASOSABS 0.0 10/10/2022 1342    BMET    Component Value Date/Time   NA 141 03/02/2023 1354   K 4.0 03/02/2023 1354   CL 110 (H) 03/02/2023 1354   CO2 17 (L) 03/02/2023 1354   GLUCOSE 96 03/02/2023 1354    GLUCOSE 93 02/24/2023 0704   BUN 28 (H) 03/02/2023 1354   CREATININE 1.96 (H) 03/02/2023 1354   CREATININE 1.39 (H) 05/11/2016 1100   CALCIUM 9.1 03/02/2023 1354   GFRNONAA 45 (L) 02/24/2023 0704   GFRNONAA 63 05/11/2016 1100   GFRAA 18 (L) 04/12/2020 0641   GFRAA 73 05/11/2016 1100      Assessment & Plan:   Need for immunization against influenza - Flu vaccine trivalent PF, 6mos and older(Flulaval,Afluria,Fluarix,Fluzone)  2. Hb-SS disease without crisis (HCC)  - Sickle Cell Panel    Follow up:  Follow  up in 3 months     Ivonne Andrew, NP 06/02/2023

## 2023-06-02 NOTE — Assessment & Plan Note (Signed)
-   Flu vaccine trivalent PF, 6mos and older(Flulaval,Afluria,Fluarix,Fluzone)  2. Hb-SS disease without crisis (HCC)  - Sickle Cell Panel    Follow up:  Follow up in 3 months

## 2023-06-08 DIAGNOSIS — E559 Vitamin D deficiency, unspecified: Secondary | ICD-10-CM | POA: Diagnosis not present

## 2023-06-08 DIAGNOSIS — T8089XA Other complications following infusion, transfusion and therapeutic injection, initial encounter: Secondary | ICD-10-CM | POA: Diagnosis not present

## 2023-06-08 DIAGNOSIS — D571 Sickle-cell disease without crisis: Secondary | ICD-10-CM | POA: Diagnosis not present

## 2023-06-08 DIAGNOSIS — G8929 Other chronic pain: Secondary | ICD-10-CM | POA: Diagnosis not present

## 2023-06-08 LAB — CMP14+CBC/D/PLT+FER+RETIC+V...
Basophils Absolute: 0 10*3/uL (ref 0.0–0.2)
Basos: 1 %
EOS (ABSOLUTE): 0.2 10*3/uL (ref 0.0–0.4)
Eos: 3 %
Hematocrit: 19.6 % — ABNORMAL LOW (ref 37.5–51.0)
Hemoglobin: 7.2 g/dL — ABNORMAL LOW (ref 13.0–17.7)
Immature Grans (Abs): 0.1 10*3/uL (ref 0.0–0.1)
Immature Granulocytes: 2 %
Lymphocytes Absolute: 1.4 10*3/uL (ref 0.7–3.1)
Lymphs: 22 %
MCH: 36.5 pg — ABNORMAL HIGH (ref 26.6–33.0)
MCHC: 36.7 g/dL — ABNORMAL HIGH (ref 31.5–35.7)
MCV: 100 fL — ABNORMAL HIGH (ref 79–97)
Monocytes Absolute: 0.4 10*3/uL (ref 0.1–0.9)
Monocytes: 6 %
NRBC: 3 % — ABNORMAL HIGH (ref 0–0)
Neutrophils Absolute: 4.3 10*3/uL (ref 1.4–7.0)
Neutrophils: 66 %
Platelets: 380 10*3/uL (ref 150–450)
RBC: 1.97 x10E6/uL — CL (ref 4.14–5.80)
RDW: 19.7 % — ABNORMAL HIGH (ref 11.6–15.4)
Retic Ct Pct: 7.8 % — ABNORMAL HIGH (ref 0.6–2.6)
Vit D, 25-Hydroxy: 31.2 ng/mL (ref 30.0–100.0)
WBC: 6.4 10*3/uL (ref 3.4–10.8)

## 2023-06-19 DIAGNOSIS — D571 Sickle-cell disease without crisis: Secondary | ICD-10-CM | POA: Diagnosis not present

## 2023-06-19 DIAGNOSIS — N1832 Chronic kidney disease, stage 3b: Secondary | ICD-10-CM | POA: Diagnosis not present

## 2023-06-19 DIAGNOSIS — Z87891 Personal history of nicotine dependence: Secondary | ICD-10-CM | POA: Diagnosis not present

## 2023-06-19 DIAGNOSIS — D869 Sarcoidosis, unspecified: Secondary | ICD-10-CM | POA: Diagnosis not present

## 2023-06-19 DIAGNOSIS — N529 Male erectile dysfunction, unspecified: Secondary | ICD-10-CM | POA: Diagnosis not present

## 2023-06-21 DIAGNOSIS — N209 Urinary calculus, unspecified: Secondary | ICD-10-CM | POA: Diagnosis not present

## 2023-06-21 DIAGNOSIS — J4550 Severe persistent asthma, uncomplicated: Secondary | ICD-10-CM | POA: Diagnosis not present

## 2023-06-21 DIAGNOSIS — D869 Sarcoidosis, unspecified: Secondary | ICD-10-CM | POA: Diagnosis not present

## 2023-06-21 DIAGNOSIS — N521 Erectile dysfunction due to diseases classified elsewhere: Secondary | ICD-10-CM | POA: Diagnosis not present

## 2023-06-21 DIAGNOSIS — Z87438 Personal history of other diseases of male genital organs: Secondary | ICD-10-CM | POA: Diagnosis not present

## 2023-06-21 DIAGNOSIS — N1832 Chronic kidney disease, stage 3b: Secondary | ICD-10-CM | POA: Diagnosis not present

## 2023-06-23 ENCOUNTER — Telehealth: Payer: Self-pay | Admitting: Nurse Practitioner

## 2023-06-23 NOTE — Telephone Encounter (Signed)
LVM for patient to call the [facility] to confirm he is taking [place] and find out who is managing this medication if he is taking. Sent patient the following Mychart message:  Dear Patients,  Voxelotor/Oxbryta has been removed from the market. We are shocked and saddened too and will let you know as we learn more about this. Please do not stop your medication right away, we would like you to taper this over 5-7 days, and we will recheck your blood work and see you soon!   Please expect a call from [place] to discuss other available options for managing sickle cell anemia.   Your care team at the [facility]

## 2023-07-26 DIAGNOSIS — D571 Sickle-cell disease without crisis: Secondary | ICD-10-CM | POA: Diagnosis not present

## 2023-07-28 DIAGNOSIS — D571 Sickle-cell disease without crisis: Secondary | ICD-10-CM | POA: Diagnosis not present

## 2023-07-28 DIAGNOSIS — D500 Iron deficiency anemia secondary to blood loss (chronic): Secondary | ICD-10-CM | POA: Diagnosis not present

## 2023-08-09 ENCOUNTER — Encounter: Payer: Self-pay | Admitting: Nurse Practitioner

## 2023-08-09 ENCOUNTER — Other Ambulatory Visit: Payer: Self-pay | Admitting: Nurse Practitioner

## 2023-08-09 ENCOUNTER — Ambulatory Visit (INDEPENDENT_AMBULATORY_CARE_PROVIDER_SITE_OTHER): Payer: Self-pay | Admitting: Nurse Practitioner

## 2023-08-09 ENCOUNTER — Other Ambulatory Visit: Payer: Self-pay

## 2023-08-09 VITALS — BP 129/58 | HR 85 | Wt 137.0 lb

## 2023-08-09 DIAGNOSIS — Z113 Encounter for screening for infections with a predominantly sexual mode of transmission: Secondary | ICD-10-CM | POA: Diagnosis not present

## 2023-08-09 MED ORDER — ALBUTEROL SULFATE HFA 108 (90 BASE) MCG/ACT IN AERS: 1.0000 | INHALATION_SPRAY | Freq: Four times a day (QID) | RESPIRATORY_TRACT | 2 refills | Status: DC | PRN

## 2023-08-09 NOTE — Telephone Encounter (Signed)
Please refill.

## 2023-08-09 NOTE — Assessment & Plan Note (Signed)
Need to maintain 1 sexual partner discussed  - HepB+HepC+HIV Panel - RPR - Chlamydia/Gonococcus/Trichomonas, NAA

## 2023-08-09 NOTE — Patient Instructions (Signed)

## 2023-08-09 NOTE — Progress Notes (Signed)
Acute Office Visit  Subjective:     Patient ID: Shawn Adams, male    DOB: [birth year], 47 y.o.   MRN: [id number]  No chief complaint on file.   HPI Mr Fellner  has a past medical history of Aneurysm (HCC), Asthma, CKD (chronic kidney disease) stage 3, GFR 30-59 ml/min (HCC), Heart murmur, Pulmonary sarcoidosis (HCC), Sarcoid, and Sickle cell anemia (HCC). Patient is in today for STI testing.  He denies fever, chest pain, shortness of breath abdominal pain, dysuria, penile discharge, rashes.  Stated that he has been maintaining 1 sexual partner  Review of Systems  Constitutional:  Negative for appetite change, chills, fatigue and fever.  HENT:  Negative for congestion, postnasal drip, rhinorrhea and sneezing.   Respiratory:  Negative for cough, shortness of breath and wheezing.   Cardiovascular:  Negative for chest pain, palpitations and leg swelling.  Gastrointestinal:  Negative for abdominal pain, constipation, nausea and vomiting.  Genitourinary:  Negative for difficulty urinating, dysuria, flank pain and frequency.  Musculoskeletal:  Negative for arthralgias, back pain, joint swelling and myalgias.  Skin:  Negative for color change, pallor, rash and wound.  Neurological:  Negative for dizziness, facial asymmetry, weakness, numbness and headaches.  Psychiatric/Behavioral:  Negative for behavioral problems, confusion, self-injury and suicidal ideas.         Objective:    BP (!) 129/58   Pulse 85   Wt 137 lb (62.1 kg)   SpO2 97%   BMI 18.07 kg/m    Physical Exam Vitals and nursing note reviewed.  Constitutional:      General: He is not in acute distress.    Appearance: Normal appearance. He is not ill-appearing, toxic-appearing or diaphoretic.  HENT:     Mouth/Throat:     Mouth: Mucous membranes are moist.     Pharynx: Oropharynx is clear. No oropharyngeal exudate or posterior oropharyngeal erythema.  Eyes:     General: No scleral icterus.       Right eye: No  discharge.        Left eye: No discharge.     Extraocular Movements: Extraocular movements intact.     Conjunctiva/sclera: Conjunctivae normal.  Cardiovascular:     Rate and Rhythm: Normal rate and regular rhythm.     Pulses: Normal pulses.     Heart sounds: Normal heart sounds. No murmur heard.    No friction rub. No gallop.  Pulmonary:     Effort: Pulmonary effort is normal. No respiratory distress.     Breath sounds: Normal breath sounds. No stridor. No wheezing, rhonchi or rales.  Chest:     Chest wall: No tenderness.  Abdominal:     General: There is no distension.     Palpations: Abdomen is soft.     Tenderness: There is no abdominal tenderness. There is no right CVA tenderness, left CVA tenderness or guarding.  Musculoskeletal:        General: No swelling, tenderness, deformity or signs of injury.     Right lower leg: No edema.     Left lower leg: No edema.  Skin:    General: Skin is warm and dry.     Capillary Refill: Capillary refill takes less than 2 seconds.     Coloration: Skin is not jaundiced or pale.     Findings: No bruising, erythema or lesion.  Neurological:     Mental Status: He is alert and oriented to person, place, and time.     Motor: No weakness.  Coordination: Coordination normal.     Gait: Gait normal.  Psychiatric:        Mood and Affect: Mood normal.        Behavior: Behavior normal.        Thought Content: Thought content normal.        Judgment: Judgment normal.     No results found for any visits on 08/09/23.      Assessment & Plan:   Problem List Items Addressed This Visit       Other   Screening examination for STI - Primary    Need to maintain 1 sexual partner discussed  - HepB+HepC+HIV Panel - RPR - Chlamydia/Gonococcus/Trichomonas, NAA       Relevant Orders   HepB+HepC+HIV Panel   RPR   Chlamydia/Gonococcus/Trichomonas, NAA    No orders of the defined types were placed in this encounter.   No follow-ups on  file.  Donell Beers, FNP

## 2023-08-10 ENCOUNTER — Telehealth: Payer: Self-pay

## 2023-08-10 DIAGNOSIS — N182 Chronic kidney disease, stage 2 (mild): Secondary | ICD-10-CM

## 2023-08-11 ENCOUNTER — Telehealth: Payer: Self-pay | Admitting: *Deleted

## 2023-08-11 NOTE — Progress Notes (Signed)
  Care Coordination   Note   08/11/2023 Name: Shawn Adams MRN: [id number] DOB: 05/09/1976  [name] is a 47 y.o. year old male who sees Ivonne Andrew, NP for primary care. I reached out to [facility] by phone today to offer care coordination services.  [name] was given information about Care Coordination services today including:   The Care Coordination services include support from the care team which includes your Nurse Coordinator, Clinical Social Worker, or Pharmacist.  The Care Coordination team is here to help remove barriers to the health concerns and goals most important to you. Care Coordination services are voluntary, and the patient may decline or stop services at any time by request to their care team member.   Care Coordination Consent Status: Patient agreed to services and verbal consent obtained.   Follow up plan:  Telephone appointment with care coordination team member scheduled for:  08/15/2023 and 08/22/2023  Encounter Outcome:  Patient Scheduled from referral   [name], [facility] Care Coordination Care Guide Direct Dial: [phone]

## 2023-08-12 LAB — CHLAMYDIA/GONOCOCCUS/TRICHOMONAS, NAA
Chlamydia by NAA: NEGATIVE
Gonococcus by NAA: NEGATIVE
Trich vag by NAA: NEGATIVE

## 2023-08-12 LAB — HEPB+HEPC+HIV PANEL
HIV Screen 4th Generation wRfx: NONREACTIVE
Hep B C IgM: NEGATIVE
Hep B Core Total Ab: NEGATIVE
Hep B E Ab: NONREACTIVE
Hep B E Ag: NEGATIVE
Hep B Surface Ab, Qual: NONREACTIVE
Hep C Virus Ab: NONREACTIVE
Hepatitis B Surface Ag: NEGATIVE

## 2023-08-12 LAB — RPR: RPR Ser Ql: NONREACTIVE

## 2023-08-15 ENCOUNTER — Ambulatory Visit: Payer: Self-pay

## 2023-08-15 NOTE — Patient Instructions (Signed)
Visit Information  Thank you for taking time to visit with me today. Please don't hesitate to contact me if I can be of assistance to you.   Following are the goals we discussed today:   Patient needs are being met and will contact provider if he needs assistance.    If you are experiencing a Mental Health or Behavioral Health Crisis or need someone to talk to, please call 911  Patient verbalizes understanding of instructions and care plan provided today and agrees to view in MyChart. Active MyChart status and patient understanding of how to access instructions and care plan via MyChart confirmed with patient.     No further follow up required: Patient does not request a follow up visit.  Lysle Morales, BSW Social Worker [phone]

## 2023-08-15 NOTE — Patient Outreach (Signed)
  Care Coordination   Initial Visit Note   08/15/2023 Name: Shawn Adams MRN: [id number] DOB: 10/31/1975  Shawn Adams is a 47 y.o. year old male who sees Ivonne Andrew, NP for primary care. I spoke with  Shawn Adams by phone today.  What matters to the patients health and wellness today?  Patient is stable and will contact provider if SDOH needs occur.      Goals Addressed             This Visit's Progress    Care Coordination Activities       Interventions Today    Flowsheet Row Most Recent Value  Chronic Disease   Chronic disease during today's visit Chronic Kidney Disease/End Stage Renal Disease (ESRD)  General Interventions   General Interventions Discussed/Reviewed General Interventions Discussed, General Interventions Reviewed  [Pt reports no concerns at this time. Pt previously had issues with housing but has been resolved. Pt is stable w/food, transportation, housing, finances and relationships.]              SDOH assessments and interventions completed:  Yes  SDOH Interventions Today    Flowsheet Row Most Recent Value  SDOH Interventions   Food Insecurity Interventions Intervention Not Indicated, Other (Comment)  [Foodstamps]  Housing Interventions Intervention Not Indicated  [Has stable housing and lives with Godmother]  Transportation Interventions Intervention Not Indicated, Other (Comment)  [Has his own car to drive]  Utilities Interventions Intervention Not Indicated  Financial Strain Interventions Intervention Not Indicated        Care Coordination Interventions:  Yes, provided   Follow up plan: No further intervention required.   Encounter Outcome:  Patient Visit Completed

## 2023-08-22 ENCOUNTER — Ambulatory Visit: Payer: Self-pay

## 2023-08-22 DIAGNOSIS — D869 Sarcoidosis, unspecified: Secondary | ICD-10-CM

## 2023-08-22 DIAGNOSIS — J42 Unspecified chronic bronchitis: Secondary | ICD-10-CM

## 2023-08-22 DIAGNOSIS — D5700 Hb-SS disease with crisis, unspecified: Secondary | ICD-10-CM

## 2023-08-22 NOTE — Patient Outreach (Signed)
  Care Coordination   Initial Visit Note   08/22/2023 Name: Bertil Skates MRN: [id number] DOB: [birth year]  Darriel Mandia is a 47 y.o. year old male who sees Ivonne Andrew, NP for primary care. I spoke with  [name] by phone today.  What matters to the patients health and wellness today?  Referral from primary provider. History of severe persistent asthma, sarcoidosis, sickle cell anemia Medications reviewed. Patient reports he uses albuterol inhaler about tid and he is not using any other inhaler. Pulmonologist Lolita Rieger, FNP with [facility]- last seen 06/21/23, per office visit note patient to take Symbicort 2 puffs bid and albuterol as needed 2-4 puffs, use with spacer. Patient states he has not been using any other inhaler but the albuterol as needed. He expresses his sickle cell disease is controlled. Declines education material on any disease process.   Goals Addressed             This Visit's Progress    Assist with Health Management       Interventions Today    Flowsheet Row Most Recent Value  Chronic Disease   Chronic disease during today's visit Chronic Kidney Disease/End Stage Renal Disease (ESRD), Other  [severe persistent asthma, sarcoidosis, sickle cell disease]  General Interventions   General Interventions Discussed/Reviewed General Interventions Discussed, Doctor Visits  [Evaluation of current treatment plan for health condition and patient's adherence to plan.]  Doctor Visits Discussed/Reviewed Doctor Visits Discussed  [reviewed upcoming follow up appointments]  Education Interventions   Education Provided Provided Education  Provided Verbal Education On Nutrition, When to see the doctor, Medication  [advised to take medications as prescribed, attend provider visits as scheduled, eat healthy, stay hydrated]  Nutrition Interventions   Nutrition Discussed/Reviewed Nutrition Discussed  Pharmacy Interventions   Pharmacy Dicussed/Reviewed Pharmacy Topics  Discussed, Referral to Pharmacist  Referral to Pharmacist Drug interaction/side effects  [medication reconciliation]            SDOH assessments and interventions completed:  Yes  SDOH Interventions Today    Flowsheet Row Most Recent Value  SDOH Interventions   Stress Interventions Intervention Not Indicated     Care Coordination Interventions:  Yes, provided   Follow up plan: Follow up call scheduled for 09/04/23    Encounter Outcome:  Patient Visit Completed   Kathyrn Sheriff, RN, MSN, BSN, CCM Care Management Coordinator [phone]

## 2023-08-22 NOTE — Patient Instructions (Signed)
Visit Information  Thank you for taking time to visit with me today. Please don't hesitate to contact me if I can be of assistance to you.   Following are the goals we discussed today:  Continue to take medications as prescribed. Continue to attend provider visits as scheduled Continue to eat healthy, lean meats, vegetables, fruits, avoid saturated and transfats Contact provider with health questions or concerns as needed   Our next appointment is by telephone on 09/04/23 at 2:00 pm  Please call the care guide team at [phone] if you need to cancel or reschedule your appointment.   If you are experiencing a Mental Health or Behavioral Health Crisis or need someone to talk to, please call the Suicide and Crisis Lifeline: 988 call the [place] National Suicide Prevention Lifeline: (380)664-1458 or TTY: (510)363-4844 TTY 820-746-6041) to talk to a trained counselor call 1-800-273-TALK (toll free, 24 hour hotline)   Kathyrn Sheriff, RN, MSN, BSN, CCM Care Management Coordinator [phone]

## 2023-08-23 ENCOUNTER — Telehealth: Payer: Self-pay

## 2023-08-23 NOTE — Progress Notes (Signed)
   Care Guide Note  08/23/2023 Name: Shawn Adams MRN: [id number] DOB: 1976-08-01  Referred by: Ivonne Andrew, NP Reason for referral : Care Coordination (Outreach to schedule with Pharm d )   Shawn Adams is a 47 y.o. year old male who is a primary care patient of Ivonne Andrew, NP. Sequoyah Clyatt was referred to the pharmacist for assistance related to Sickle Cell Disease.    An unsuccessful telephone outreach was attempted today to contact the patient who was referred to the pharmacy team for assistance with medication management. Additional attempts will be made to contact the patient.   Penne Lash , RMA     [facility]  [facility] Guide  Direct Dial: [phone]  Website: [email or web address]

## 2023-08-28 NOTE — Progress Notes (Signed)
   Care Guide Note  08/28/2023 Name: Shawn Adams MRN: [id number] DOB: Jun 08, 1976  Referred by: Ivonne Andrew, NP Reason for referral : Care Coordination (Outreach to schedule with Pharm d )   Shawn Adams is a 47 y.o. year old male who is a primary care patient of Ivonne Andrew, NP. Shawn Adams was referred to the pharmacist for assistance related to Pulmonary Disease and Sickle Cell Disease.    Successful contact was made with the patient to discuss pharmacy services including being ready for the pharmacist to call at least 5 minutes before the scheduled appointment time, to have medication bottles and any blood sugar or blood pressure readings ready for review. The patient agreed to meet with the pharmacist via with the pharmacist via telephone visit on (date/time).  09/28/2023  Shawn Adams , RMA     [place]  [facility], [facility] Guide  Direct Dial: [phone]  Website: [email or web address]

## 2023-08-29 DIAGNOSIS — Z114 Encounter for screening for human immunodeficiency virus [HIV]: Secondary | ICD-10-CM | POA: Diagnosis not present

## 2023-08-29 DIAGNOSIS — Z113 Encounter for screening for infections with a predominantly sexual mode of transmission: Secondary | ICD-10-CM | POA: Diagnosis not present

## 2023-09-01 ENCOUNTER — Encounter: Payer: Self-pay | Admitting: Nurse Practitioner

## 2023-09-01 ENCOUNTER — Ambulatory Visit (INDEPENDENT_AMBULATORY_CARE_PROVIDER_SITE_OTHER): Payer: Medicare HMO | Admitting: Nurse Practitioner

## 2023-09-01 VITALS — BP 132/81 | HR 71 | Wt 139.8 lb

## 2023-09-01 DIAGNOSIS — D571 Sickle-cell disease without crisis: Secondary | ICD-10-CM | POA: Diagnosis not present

## 2023-09-01 NOTE — Patient Instructions (Signed)
1. Hb-SS disease without crisis ([place])  - Sickle Cell Panel    Follow up:  Follow up in 3 months

## 2023-09-01 NOTE — Progress Notes (Signed)
Subjective   Patient ID: Shawn Adams, male    DOB: 1976-03-07, 47 y.o.   MRN: [id number]  Chief Complaint  Patient presents with   Medical Management of Chronic Issues    Patient wanting to go over STI/STD results     Referring provider: Ivonne Andrew, NP  Shawn Adams is a 47 y.o. male with Past Medical History: No date: Aneurysm (HCC)     Comment:  Pt states happened in 2011, Brain  No date: Asthma No date: CKD (chronic kidney disease) stage 3, GFR 30-59 ml/min (HCC) No date: Heart murmur No date: Pulmonary sarcoidosis (HCC) No date: Sarcoid No date: Sickle cell anemia (HCC)   HPI  Patient presents today for a follow-up visit.  He is seen by pulmonary through [facility] for sarcoidosis and he is also seen through hematology through [facility] for sickle cell disease.  Overall patient has been doing well with no new issues or concerns.  Denies f/c/s, n/v/d, hemoptysis, PND, leg swelling. Denies chest pain or edema.   No Known Allergies  Immunization History  Administered Date(s) Administered   Dtap, Unspecified 09/08/1976, 12/29/1976, 02/23/1977, 03/08/1978, 07/16/1980, 10/02/2013   Fluzone Influenza virus vaccine,trivalent (IIV3), split virus 07/19/2012, 09/06/2013, 05/11/2016   HIB (PRP-T) 09/11/2013   Hep A / Hep B 05/27/2009   Influenza, Seasonal, Injecte, Preservative Fre 06/02/2023   Influenza,inj,Quad PF,6+ Mos 06/21/2014, 07/08/2015, 05/11/2016, 07/17/2017, 06/21/2018, 09/30/2019, 07/22/2021, 09/05/2022   Influenza,inj,quad, With Preservative 05/11/2016   Influenza-Unspecified 07/10/2018   MMR 11/09/1977, 10/04/2013   Meningococcal B, OMV 11/26/2018, 01/24/2020   Meningococcal Conjugate 09/11/2013, 11/26/2018, 11/26/2018   PFIZER(Purple Top)SARS-COV-2 Vaccination 12/06/2019, 12/15/2019, 12/30/2019   Pfizer Covid-19 Vaccine Bivalent Booster 25yrs & up 07/30/2021   Pneumococcal Conjugate-13 11/19/2015   Pneumococcal Polysaccharide-23 10/02/2009,  10/23/2017   Polio, Unspecified 09/08/1976, 12/29/1976, 03/08/1978, 07/16/1980   Tdap 07/08/2015   Unspecified SARS-COV-2 Vaccination 12/15/2019, 12/30/2019    Tobacco History: Social History   Tobacco Use  Smoking Status Former   Types: Cigarettes  Smokeless Tobacco Never   Counseling given: Not Answered   Outpatient Encounter Medications as of 09/01/2023  Medication Sig   acetaminophen (TYLENOL) 500 MG tablet Take 500 mg by mouth every 6 (six) hours as needed for moderate pain or fever (pain).    albuterol (VENTOLIN HFA) 108 (90 Base) MCG/ACT inhaler Inhale 1-2 puffs into the lungs every 6 (six) hours as needed for wheezing or shortness of breath.   Ascorbic Acid (VITAMIN C) 1000 MG tablet Take 1,000 mg by mouth daily.   cyanocobalamin 50 MCG tablet Take 100 mcg by mouth daily.   cyclobenzaprine (FLEXERIL) 10 MG tablet Take 1 tablet (10 mg total) by mouth 3 (three) times daily as needed for muscle spasms.   docusate sodium (COLACE) 100 MG capsule Take 100 mg by mouth daily as needed for mild constipation.   folic acid (FOLVITE) 1 MG tablet Take 1 tablet (1 mg total) by mouth daily.   hydroxychloroquine (PLAQUENIL) 200 MG tablet Take 200 mg by mouth daily.   hydroxyurea (HYDREA) 500 MG capsule Take 2 capsules (1,000 mg total) by mouth daily. May take with food to minimize GI side effects.   INCRUSE ELLIPTA 62.5 MCG/INH AEPB Inhale 1 puff into the lungs daily as needed (for shortness of breath).   losartan (COZAAR) 25 MG tablet Take 25 mg by mouth daily.   Melatonin 10 MG TABS Take 10 mg by mouth at bedtime.   ondansetron (ZOFRAN) 4 MG tablet Take 4 mg by  mouth every 8 (eight) hours as needed for vomiting or nausea.   OXBRYTA 500 MG TABS tablet Take 1,500 mg by mouth at bedtime.   Oxycodone HCl 20 MG TABS Take 1 tablet by mouth daily as needed (For pain).   pantoprazole (PROTONIX) 40 MG tablet Take 1 tablet (40 mg total) by mouth daily.   sodium bicarbonate 650 MG tablet Take 650 mg  by mouth daily.   SYMBICORT 80-4.5 MCG/ACT inhaler Inhale 2 puffs into the lungs daily as needed (for shortness of breath).   Vitamin D, Ergocalciferol, (DRISDOL) 1.25 MG (50000 UNIT) CAPS capsule TAKE 1 CAPSULE BY MOUTH EVERY 7 DAYS   budesonide-formoterol (SYMBICORT) 160-4.5 MCG/ACT inhaler Inhale 2 puffs into the lungs 2 (two) times daily.   No facility-administered encounter medications on file as of 09/01/2023.    Review of Systems  Review of Systems  Constitutional: Negative.   HENT: Negative.    Cardiovascular: Negative.   Gastrointestinal: Negative.   Allergic/Immunologic: Negative.   Neurological: Negative.   Psychiatric/Behavioral: Negative.       Objective:   BP 132/81 (BP Location: Left Arm, Patient Position: Sitting, Cuff Size: Normal)   Pulse 71   Wt 139 lb 12.8 oz (63.4 kg)   SpO2 95%   BMI 18.44 kg/m   Wt Readings from Last 5 Encounters:  09/01/23 139 lb 12.8 oz (63.4 kg)  08/09/23 137 lb (62.1 kg)  06/02/23 125 lb (56.7 kg)  03/23/23 140 lb (63.5 kg)  03/02/23 127 lb 12.8 oz (58 kg)     Physical Exam Vitals and nursing note reviewed.  Constitutional:      General: He is not in acute distress.    Appearance: He is well-developed.  Cardiovascular:     Rate and Rhythm: Normal rate and regular rhythm.  Pulmonary:     Effort: Pulmonary effort is normal.     Breath sounds: Normal breath sounds.  Skin:    General: Skin is warm and dry.  Neurological:     Mental Status: He is alert and oriented to person, place, and time.       Assessment & Plan:   Hb-SS disease without crisis (HCC) -     Sickle Cell Panel     Return in about 3 months (around 11/30/2023).   Ivonne Andrew, NP 09/01/2023

## 2023-09-02 LAB — CMP14+CBC/D/PLT+FER+RETIC+V...
ALT: 23 [IU]/L (ref 0–44)
AST: 45 [IU]/L — ABNORMAL HIGH (ref 0–40)
Albumin: 3.7 g/dL — ABNORMAL LOW (ref 4.1–5.1)
Alkaline Phosphatase: 127 [IU]/L — ABNORMAL HIGH (ref 44–121)
BUN/Creatinine Ratio: 10 (ref 9–20)
BUN: 27 mg/dL — ABNORMAL HIGH (ref 6–24)
Basophils Absolute: 0.1 10*3/uL (ref 0.0–0.2)
Basos: 1 %
Bilirubin Total: 1.2 mg/dL (ref 0.0–1.2)
CO2: 17 mmol/L — ABNORMAL LOW (ref 20–29)
Calcium: 10.3 mg/dL — ABNORMAL HIGH (ref 8.7–10.2)
Chloride: 109 mmol/L — ABNORMAL HIGH (ref 96–106)
Creatinine, Ser: 2.59 mg/dL — ABNORMAL HIGH (ref 0.76–1.27)
EOS (ABSOLUTE): 0.2 10*3/uL (ref 0.0–0.4)
Eos: 3 %
Ferritin: 3302 ng/mL — ABNORMAL HIGH (ref 30–400)
Globulin, Total: 3.5 g/dL (ref 1.5–4.5)
Glucose: 107 mg/dL — ABNORMAL HIGH (ref 70–99)
Hematocrit: 18.5 % — ABNORMAL LOW (ref 37.5–51.0)
Hemoglobin: 6.2 g/dL — CL (ref 13.0–17.7)
Immature Grans (Abs): 0.1 10*3/uL (ref 0.0–0.1)
Immature Granulocytes: 1 %
Lymphocytes Absolute: 1.4 10*3/uL (ref 0.7–3.1)
Lymphs: 21 %
MCH: 33.3 pg — ABNORMAL HIGH (ref 26.6–33.0)
MCHC: 33.5 g/dL (ref 31.5–35.7)
MCV: 100 fL — ABNORMAL HIGH (ref 79–97)
Monocytes Absolute: 0.6 10*3/uL (ref 0.1–0.9)
Monocytes: 9 %
NRBC: 4 % — ABNORMAL HIGH (ref 0–0)
Neutrophils Absolute: 4.2 10*3/uL (ref 1.4–7.0)
Neutrophils: 65 %
Platelets: 443 10*3/uL (ref 150–450)
Potassium: 4.3 mmol/L (ref 3.5–5.2)
RBC: 1.86 x10E6/uL — CL (ref 4.14–5.80)
RDW: 19.4 % — ABNORMAL HIGH (ref 11.6–15.4)
Retic Ct Pct: 12.4 % — ABNORMAL HIGH (ref 0.6–2.6)
Sodium: 139 mmol/L (ref 134–144)
Total Protein: 7.2 g/dL (ref 6.0–8.5)
Vit D, 25-Hydroxy: 35 ng/mL (ref 30.0–100.0)
WBC: 6.4 10*3/uL (ref 3.4–10.8)
eGFR: 30 mL/min/{1.73_m2} — ABNORMAL LOW (ref >59)

## 2023-09-04 ENCOUNTER — Emergency Department (HOSPITAL_COMMUNITY): Payer: Medicare HMO

## 2023-09-04 ENCOUNTER — Encounter (HOSPITAL_COMMUNITY): Payer: Self-pay | Admitting: Family Medicine

## 2023-09-04 ENCOUNTER — Other Ambulatory Visit: Payer: Self-pay

## 2023-09-04 ENCOUNTER — Inpatient Hospital Stay (HOSPITAL_COMMUNITY)
Admission: EM | Admit: 2023-09-04 | Discharge: 2023-09-08 | DRG: 812 | Disposition: A | Discharge status: Home / Self Care | Payer: Medicare HMO | Attending: Internal Medicine | Admitting: Internal Medicine

## 2023-09-04 DIAGNOSIS — I129 Hypertensive chronic kidney disease with stage 1 through stage 4 chronic kidney disease, or unspecified chronic kidney disease: Secondary | ICD-10-CM | POA: Diagnosis present

## 2023-09-04 DIAGNOSIS — Z8269 Family history of other diseases of the musculoskeletal system and connective tissue: Secondary | ICD-10-CM

## 2023-09-04 DIAGNOSIS — Z79899 Other long term (current) drug therapy: Secondary | ICD-10-CM

## 2023-09-04 DIAGNOSIS — D860 Sarcoidosis of lung: Secondary | ICD-10-CM | POA: Diagnosis present

## 2023-09-04 DIAGNOSIS — Z7951 Long term (current) use of inhaled steroids: Secondary | ICD-10-CM | POA: Diagnosis not present

## 2023-09-04 DIAGNOSIS — Z9889 Other specified postprocedural states: Secondary | ICD-10-CM

## 2023-09-04 DIAGNOSIS — D638 Anemia in other chronic diseases classified elsewhere: Secondary | ICD-10-CM | POA: Diagnosis present

## 2023-09-04 DIAGNOSIS — J45909 Unspecified asthma, uncomplicated: Secondary | ICD-10-CM | POA: Diagnosis present

## 2023-09-04 DIAGNOSIS — N1832 Chronic kidney disease, stage 3b: Secondary | ICD-10-CM | POA: Diagnosis present

## 2023-09-04 DIAGNOSIS — Z833 Family history of diabetes mellitus: Secondary | ICD-10-CM | POA: Diagnosis not present

## 2023-09-04 DIAGNOSIS — G894 Chronic pain syndrome: Secondary | ICD-10-CM | POA: Diagnosis present

## 2023-09-04 DIAGNOSIS — R590 Localized enlarged lymph nodes: Secondary | ICD-10-CM | POA: Diagnosis not present

## 2023-09-04 DIAGNOSIS — R079 Chest pain, unspecified: Secondary | ICD-10-CM | POA: Diagnosis not present

## 2023-09-04 DIAGNOSIS — D5700 Hb-SS disease with crisis, unspecified: Secondary | ICD-10-CM | POA: Diagnosis not present

## 2023-09-04 DIAGNOSIS — I10 Essential (primary) hypertension: Secondary | ICD-10-CM | POA: Diagnosis not present

## 2023-09-04 DIAGNOSIS — Z8679 Personal history of other diseases of the circulatory system: Secondary | ICD-10-CM

## 2023-09-04 DIAGNOSIS — Z87891 Personal history of nicotine dependence: Secondary | ICD-10-CM

## 2023-09-04 DIAGNOSIS — N179 Acute kidney failure, unspecified: Secondary | ICD-10-CM | POA: Diagnosis present

## 2023-09-04 DIAGNOSIS — Z8249 Family history of ischemic heart disease and other diseases of the circulatory system: Secondary | ICD-10-CM | POA: Diagnosis not present

## 2023-09-04 LAB — CBC WITH DIFFERENTIAL/PLATELET
Abs Immature Granulocytes: 0.07 10*3/uL (ref 0.00–0.07)
Basophils Absolute: 0 10*3/uL (ref 0.0–0.1)
Basophils Relative: 1 %
Eosinophils Absolute: 0.2 10*3/uL (ref 0.0–0.5)
Eosinophils Relative: 3 %
HCT: 14.7 % — ABNORMAL LOW (ref 39.0–52.0)
Hemoglobin: 5.4 g/dL — CL (ref 13.0–17.0)
Immature Granulocytes: 1 %
Lymphocytes Relative: 21 %
Lymphs Abs: 1.6 10*3/uL (ref 0.7–4.0)
MCH: 33.8 pg (ref 26.0–34.0)
MCHC: 36.7 g/dL — ABNORMAL HIGH (ref 30.0–36.0)
MCV: 91.9 fL (ref 80.0–100.0)
Monocytes Absolute: 0.5 10*3/uL (ref 0.1–1.0)
Monocytes Relative: 6 %
Neutro Abs: 5.1 10*3/uL (ref 1.7–7.7)
Neutrophils Relative %: 68 %
Platelets: 394 10*3/uL (ref 150–400)
RBC: 1.6 MIL/uL — ABNORMAL LOW (ref 4.22–5.81)
RDW: 22.4 % — ABNORMAL HIGH (ref 11.5–15.5)
WBC: 7.4 10*3/uL (ref 4.0–10.5)
nRBC: 2 % — ABNORMAL HIGH (ref 0.0–0.2)

## 2023-09-04 LAB — COMPREHENSIVE METABOLIC PANEL
ALT: 26 U/L (ref 0–44)
AST: 50 U/L — ABNORMAL HIGH (ref 15–41)
Albumin: 3.5 g/dL (ref 3.5–5.0)
Alkaline Phosphatase: 90 U/L (ref 38–126)
Anion gap: 8 (ref 5–15)
BUN: 38 mg/dL — ABNORMAL HIGH (ref 6–20)
CO2: 19 mmol/L — ABNORMAL LOW (ref 22–32)
Calcium: 10.6 mg/dL — ABNORMAL HIGH (ref 8.9–10.3)
Chloride: 111 mmol/L (ref 98–111)
Creatinine, Ser: 2.55 mg/dL — ABNORMAL HIGH (ref 0.61–1.24)
GFR, Estimated: 30 mL/min — ABNORMAL LOW (ref >60)
Glucose, Bld: 92 mg/dL (ref 70–99)
Potassium: 4.5 mmol/L (ref 3.5–5.1)
Sodium: 138 mmol/L (ref 135–145)
Total Bilirubin: 1.4 mg/dL — ABNORMAL HIGH (ref <1.2)
Total Protein: 8 g/dL (ref 6.5–8.1)

## 2023-09-04 LAB — RETICULOCYTES
Immature Retic Fract: 27.6 % — ABNORMAL HIGH (ref 2.3–15.9)
RBC.: 1.6 MIL/uL — ABNORMAL LOW (ref 4.22–5.81)
Retic Count, Absolute: 162 10*3/uL (ref 19.0–186.0)
Retic Ct Pct: 10.1 % — ABNORMAL HIGH (ref 0.4–3.1)

## 2023-09-04 LAB — PREPARE RBC (CROSSMATCH)

## 2023-09-04 MED ORDER — SODIUM CHLORIDE 0.9 % IV SOLN
12.5000 mg | Freq: Once | INTRAVENOUS | Status: AC
  Administered 2023-09-04: 12.5 mg via INTRAVENOUS
  Filled 2023-09-04 (×2): qty 0.25

## 2023-09-04 MED ORDER — OXYCODONE HCL 5 MG PO TABS: 15.0000 mg | ORAL_TABLET | Freq: Once | ORAL | Status: DC

## 2023-09-04 MED ORDER — HYDROMORPHONE HCL 2 MG/ML IJ SOLN
2.0000 mg | INTRAMUSCULAR | Status: AC
  Administered 2023-09-04: 2 mg via INTRAVENOUS
  Filled 2023-09-04: qty 1

## 2023-09-04 MED ORDER — SODIUM CHLORIDE 0.9% FLUSH: 9.0000 mL | INTRAVENOUS | Status: DC | PRN

## 2023-09-04 MED ORDER — POLYETHYLENE GLYCOL 3350 17 G PO PACK: 17.0000 g | PACK | Freq: Every day | ORAL | Status: DC | PRN

## 2023-09-04 MED ORDER — HYDROMORPHONE HCL 1 MG/ML IJ SOLN: 1.0000 mg | INTRAMUSCULAR | Status: DC | PRN

## 2023-09-04 MED ORDER — HYDROMORPHONE 1 MG/ML IV SOLN
INTRAVENOUS | Status: DC
  Administered 2023-09-04: 30 mg via INTRAVENOUS
  Administered 2023-09-05: 0.1 mg via INTRAVENOUS
  Administered 2023-09-08 (×2): 30 mg via INTRAVENOUS
  Filled 2023-09-04 (×2): qty 30

## 2023-09-04 MED ORDER — ONDANSETRON HCL 4 MG/2ML IJ SOLN: 4.0000 mg | INTRAMUSCULAR | Status: DC | PRN

## 2023-09-04 MED ORDER — DIPHENHYDRAMINE HCL 25 MG PO CAPS: 25.0000 mg | ORAL_CAPSULE | ORAL | Status: DC | PRN

## 2023-09-04 MED ORDER — SODIUM CHLORIDE 0.9% IV SOLUTION: Freq: Once | INTRAVENOUS | Status: DC

## 2023-09-04 MED ORDER — ONDANSETRON HCL 4 MG/2ML IJ SOLN
4.0000 mg | Freq: Four times a day (QID) | INTRAMUSCULAR | Status: DC | PRN
Start: 2023-09-04 — End: 2023-09-08

## 2023-09-04 MED ORDER — NALOXONE HCL 0.4 MG/ML IJ SOLN: 0.4000 mg | INTRAMUSCULAR | Status: DC | PRN

## 2023-09-04 MED ORDER — SENNOSIDES-DOCUSATE SODIUM 8.6-50 MG PO TABS
1.0000 | ORAL_TABLET | Freq: Two times a day (BID) | ORAL | Status: DC
  Administered 2023-09-06 – 2023-09-08 (×3): 1 via ORAL
  Filled 2023-09-04 (×5): qty 1

## 2023-09-04 MED ORDER — HEPARIN SODIUM (PORCINE) 5000 UNIT/ML IJ SOLN
5000.0000 [IU] | Freq: Three times a day (TID) | INTRAMUSCULAR | Status: DC
  Administered 2023-09-04 – 2023-09-08 (×12): 5000 [IU] via SUBCUTANEOUS
  Filled 2023-09-04 (×12): qty 1

## 2023-09-04 NOTE — ED Provider Triage Note (Signed)
Emergency Medicine Provider Triage Evaluation Note  Shawn Adams , a 47 y.o. male  was evaluated in triage.  Pt complains of sickle cell crisis.  Review of Systems  Positive: CP, back pain Negative: Abd pain, NVD, SOB  Physical Exam  BP 128/80 (BP Location: Left Arm)   Pulse 88   Temp 97.6 F (36.4 C) (Oral)   Ht 6' (1.829 m)   Wt 63.5 kg   SpO2 100%   BMI 18.99 kg/m  Gen:   Awake, no distress   Resp:  Normal effort  MSK:   Moves extremities without difficulty  Other:  No abd tenderness  Medical Decision Making  Medically screening exam initiated at 3:06 PM.  Appropriate orders placed.  Shawn Adams was informed that the remainder of the evaluation will be completed by another provider, this initial triage assessment does not replace that evaluation, and the importance of remaining in the ED until their evaluation is complete.   Patient reporting to emergency room with sickle cell pain crisis.  Has tried his at home medications pain is still not well-controlled.  Has not had recent sickle cell pain crisis last was in May.  Patient denies any cardiac history.  Denies Cough or URI-like symptoms.    Smitty Knudsen, PA-C 09/04/23 1510

## 2023-09-04 NOTE — ED Provider Notes (Addendum)
[place] EMERGENCY DEPARTMENT AT [facility] Provider Note   CSN: [id number] Arrival date & time: 09/04/23  1337     History  Chief Complaint  Patient presents with   Sickle Cell Pain Crisis    Shawn Adams is a 47 y.o. male.   Sickle Cell Pain Crisis    47 year old male with medical history significant for sickle cell disease who presents to the emergency department with concern for sickle cell pain.  The patient states that he has had back pain for the past few days that has not been alleviated by his home opiate pain medication.  On chart review his normal hemoglobin is between 7 and 8 and he has had to have blood transfusions normally when his hemoglobin drops below 6.  He denies any rectal bleeding, no dark sticky stools.  He denies any abdominal pain, nausea or vomiting, denies any chest pain, shortness of breath, cough fever or chills.  Home Medications Prior to Admission medications   Medication Sig Start Date End Date Taking? Authorizing Provider  acetaminophen (TYLENOL) 500 MG tablet Take 500 mg by mouth every 6 (six) hours as needed for moderate pain or fever (pain).     [provider]  albuterol (VENTOLIN HFA) 108 (90 Base) MCG/ACT inhaler Inhale 1-2 puffs into the lungs every 6 (six) hours as needed for wheezing or shortness of breath. 08/09/23   Ivonne Andrew, NP  Ascorbic Acid (VITAMIN C) 1000 MG tablet Take 1,000 mg by mouth daily.    [provider]  budesonide-formoterol (SYMBICORT) 160-4.5 MCG/ACT inhaler Inhale 2 puffs into the lungs 2 (two) times daily. 05/10/18 08/09/23  [provider]  cyanocobalamin 50 MCG tablet Take 100 mcg by mouth daily.    [provider]  cyclobenzaprine (FLEXERIL) 10 MG tablet Take 1 tablet (10 mg total) by mouth 3 (three) times daily as needed for muscle spasms. 04/09/18   Street, [place], PA-C  docusate sodium (COLACE) 100 MG capsule Take 100 mg by mouth daily as needed for mild  constipation.    [provider]  folic acid (FOLVITE) 1 MG tablet Take 1 tablet (1 mg total) by mouth daily. 02/09/16   Quentin Angst, MD  hydroxychloroquine (PLAQUENIL) 200 MG tablet Take 200 mg by mouth daily. 01/13/20   [provider]  hydroxyurea (HYDREA) 500 MG capsule Take 2 capsules (1,000 mg total) by mouth daily. May take with food to minimize GI side effects. 02/09/16   [name], Phylliss Blakes, MD  INCRUSE ELLIPTA 62.5 MCG/INH AEPB Inhale 1 puff into the lungs daily as needed (for shortness of breath). 03/17/20   [provider]  losartan (COZAAR) 25 MG tablet Take 25 mg by mouth daily.    [provider]  Melatonin 10 MG TABS Take 10 mg by mouth at bedtime.    [provider]  ondansetron (ZOFRAN) 4 MG tablet Take 4 mg by mouth every 8 (eight) hours as needed for vomiting or nausea. 11/22/21   [provider]  OXBRYTA 500 MG TABS tablet Take 1,500 mg by mouth at bedtime. 03/03/20   [provider]  Oxycodone HCl 20 MG TABS Take 1 tablet by mouth daily as needed (For pain). 08/23/22   [provider]  pantoprazole (PROTONIX) 40 MG tablet Take 1 tablet (40 mg total) by mouth daily. 11/24/22   Pyrtle, Carie Caddy, MD  sodium bicarbonate 650 MG tablet Take 650 mg by mouth daily. 10/14/19   [provider]  SYMBICORT 80-4.5 MCG/ACT inhaler Inhale 2 puffs into the lungs daily as needed (for shortness of breath). 11/24/20   [provider]  Vitamin D, Ergocalciferol, (DRISDOL) 1.25 MG (50000 UNIT) CAPS capsule TAKE 1 CAPSULE BY MOUTH EVERY 7 DAYS 11/28/22   Ivonne Andrew, NP      Allergies    Patient has no known allergies.    Review of Systems   Review of Systems  All other systems reviewed and are negative.   Physical Exam Updated Vital Signs BP 137/64   Pulse 93   Temp 97.6 F (36.4 C) (Oral)   Resp 20   Ht 6' (1.829 m)   Wt 63.5 kg   SpO2 97%   BMI 18.99 kg/m  Physical Exam Vitals and nursing  note reviewed.  Constitutional:      General: He is not in acute distress.    Appearance: He is well-developed.  HENT:     Head: Normocephalic and atraumatic.  Eyes:     Conjunctiva/sclera: Conjunctivae normal.  Cardiovascular:     Rate and Rhythm: Normal rate and regular rhythm.  Pulmonary:     Effort: Pulmonary effort is normal. No respiratory distress.     Breath sounds: Normal breath sounds.  Abdominal:     Palpations: Abdomen is soft.     Tenderness: There is no abdominal tenderness.  Musculoskeletal:        General: No swelling.     Cervical back: Neck supple.  Skin:    General: Skin is warm and dry.     Capillary Refill: Capillary refill takes less than 2 seconds.  Neurological:     Mental Status: He is alert.  Psychiatric:        Mood and Affect: Mood normal.     ED Results / Procedures / Treatments   Labs (all labs ordered are listed, but only abnormal results are displayed) Labs Reviewed  COMPREHENSIVE METABOLIC PANEL - Abnormal; Notable for the following components:      Result Value   CO2 19 (*)    BUN 38 (*)    Creatinine, Ser 2.55 (*)    Calcium 10.6 (*)    AST 50 (*)    Total Bilirubin 1.4 (*)    GFR, Estimated 30 (*)    All other components within normal limits  CBC WITH DIFFERENTIAL/PLATELET - Abnormal; Notable for the following components:   RBC 1.60 (*)    Hemoglobin 5.4 (*)    HCT 14.7 (*)    MCHC 36.7 (*)    RDW 22.4 (*)    nRBC 2.0 (*)    All other components within normal limits  RETICULOCYTES - Abnormal; Notable for the following components:   Retic Ct Pct 10.1 (*)    RBC. 1.60 (*)    Immature Retic Fract 27.6 (*)    All other components within normal limits  TYPE AND SCREEN  PREPARE RBC (CROSSMATCH)    EKG EKG Interpretation Date/Time:  Monday September 04 2023 15:24:29 EST Ventricular Rate:  75 PR Interval:  191 QRS Duration:  100 QT Interval:  399 QTC Calculation: 446 R Axis:   23  Text Interpretation: Sinus rhythm  Abnormal R-wave progression, early transition Left ventricular hypertrophy No significant change since last tracing Confirmed by Ernie Avena ([id number]) on 09/04/2023 4:35:38 PM  Radiology DG Chest 2 View  Result Date: 09/04/2023 CLINICAL DATA:  Chest pain EXAM: CHEST - 2 VIEW COMPARISON:  Chest radiograph 12/07/2020 FINDINGS: Stable cardiac and mediastinal contours. Persistent  bilateral hilar adenopathy. No large area pulmonary consolidation. Persistent interstitial prominence compatible with history of sarcoid. No pleural effusion or pneumothorax. IMPRESSION: Persistent bilateral hilar adenopathy. No acute process. Electronically Signed   By: Annia Belt M.D.   On: 09/04/2023 16:46    Procedures .Critical Care  Performed by: Ernie Avena, MD Authorized by: Ernie Avena, MD   Critical care provider statement:    Critical care time (minutes):  30   Critical care was time spent personally by me on the following activities:  Development of treatment plan with patient or surrogate, discussions with consultants, evaluation of patient's response to treatment, examination of patient, ordering and review of laboratory studies, ordering and review of radiographic studies, ordering and performing treatments and interventions, pulse oximetry, re-evaluation of patient's condition and review of old charts   Care discussed with: admitting provider       Medications Ordered in ED Medications  oxyCODONE (Oxy IR/ROXICODONE) immediate release tablet 15 mg (has no administration in time range)  ondansetron (ZOFRAN) injection 4 mg (has no administration in time range)  0.9 %  sodium chloride infusion (Manually program via Guardrails IV Fluids) (has no administration in time range)  HYDROmorphone (DILAUDID) injection 2 mg (2 mg Intravenous Given 09/04/23 1532)  HYDROmorphone (DILAUDID) injection 2 mg (2 mg Intravenous Given 09/04/23 1603)  HYDROmorphone (DILAUDID) injection 2 mg (2 mg Intravenous Given 09/04/23  1629)  diphenhydrAMINE (BENADRYL) 12.5 mg in sodium chloride 0.9 % 50 mL IVPB (12.5 mg Intravenous New Bag/Given 09/04/23 1603)    ED Course/ Medical Decision Making/ A&P                                 Medical Decision Making Amount and/or Complexity of Data Reviewed Labs: ordered.  Risk Prescription drug management. Decision regarding hospitalization.    47 year old male with medical history significant for sickle cell disease who presents to the emergency department with concern for sickle cell pain.  The patient states that he has had back pain for the past few days that has not been alleviated by his home opiate pain medication.  On chart review his normal hemoglobin is between 7 and 8 and he has had to have blood transfusions normally when his hemoglobin drops below 6.  He denies any rectal bleeding, no dark sticky stools.  He denies any abdominal pain, nausea or vomiting, denies any chest pain, shortness of breath, cough fever or chills.  On arrival, the patient was Was vitally stable, afebrile, not tachycardic or tachypneic, sinus rhythm noted on cardiac telemetry.  Patient presenting with back pain in the setting of his known sickle cell disease consistent with known previous episodes of sickle cell pain crises.  On arrival, the patient's hemoglobin was found to be 5.4.  IV access was obtained and the patient was administered IV Dilaudid in addition to IV Benadryl.  The patient was consented for blood transfusion.  Additional laboratory evaluation revealed acute  on chronic disease with a serum creatinine of 2.55 from a baseline of around 2-2.3.  CXR: No active disease  Given the patient's acute anemia, concern for sickle cell pain crisis, hospitalist medicine consulted for admission, Dr. Antionette Char accepting.   Final Clinical Impression(s) / ED Diagnoses Final diagnoses:  Sickle cell pain crisis [facility])    Rx / DC Orders ED Discharge Orders     None         Ernie Avena,  MD 09/04/23  5573    Ernie Avena, MD 09/04/23 [phone]

## 2023-09-04 NOTE — ED Triage Notes (Signed)
Pt arrived via POV. C/o SCC for 2x days. Pain mainly in back. Home meds not effective  AOx4

## 2023-09-04 NOTE — ED Notes (Signed)
ED TO INPATIENT HANDOFF REPORT  ED Nurse Name and Phone #: Thamas Jaegers Name/Age/Gender Shawn Adams 47 y.o. male Room/Bed: WA19/WA19  Code Status   Code Status: Full Code  Home/SNF/Other Home Patient oriented to: self, place, time, and situation Is this baseline? Yes   Triage Complete: Triage complete  Chief Complaint Sickle cell crisis (HCC) [D57.00]  Triage Note Pt arrived via POV. C/o SCC for 2x days. Pain mainly in back. Home meds not effective  AOx4   Allergies No Known Allergies  Level of Care/Admitting Diagnosis ED Disposition     ED Disposition  Admit   Condition  --   Comment  Hospital Area: [facility] [[id number]]  Level of Care: Med-Surg [16]  May admit patient to [facility] or [facility] if equivalent level of care is available:: No  Covid Evaluation: Asymptomatic - no recent exposure (last 10 days) testing not required  Diagnosis: Sickle cell crisis [facility]) [[id number]]  Admitting Physician: Briscoe Deutscher [[id number]]  Attending Physician: Briscoe Deutscher [[id number]]  Certification:: I certify this patient will need inpatient services for at least 2 midnights  Expected Medical Readiness: 09/06/2023          B Medical/Surgery History Past Medical History:  Diagnosis Date   Aneurysm (HCC)    Pt states happened in 2011, Brain    Asthma    CKD (chronic kidney disease) stage 3, GFR 30-59 ml/min (HCC)    Heart murmur    Pulmonary sarcoidosis (HCC)    Sarcoid    Sickle cell anemia (HCC)    Past Surgical History:  Procedure Laterality Date   LYMPH NODE BIOPSY     NASAL SINUS SURGERY       A IV Location/Drains/Wounds Patient Lines/Drains/Airways Status     Active Line/Drains/Airways     Name Placement date Placement time Site Days   Peripheral IV 09/04/23 22 G Left Antecubital 09/04/23  1529  Antecubital  less than 1            Intake/Output Last 24 hours  Intake/Output Summary (Last 24 hours) at 09/04/2023 1734 Last  data filed at 09/04/2023 1733 Gross per 24 hour  Intake 50 ml  Output --  Net 50 ml    Labs/Imaging Results for orders placed or performed during the hospital encounter of 09/04/23 (from the past 48 hour(s))  Comprehensive metabolic panel     Status: Abnormal   Collection Time: 09/04/23  3:20 PM  Result Value Ref Range   Sodium 138 135 - 145 mmol/L   Potassium 4.5 3.5 - 5.1 mmol/L   Chloride 111 98 - 111 mmol/L   CO2 19 (L) 22 - 32 mmol/L   Glucose, Bld 92 70 - 99 mg/dL    Comment: Glucose reference range applies only to samples taken after fasting for at least 8 hours.   BUN 38 (H) 6 - 20 mg/dL   Creatinine, Ser 4.01 (H) 0.61 - 1.24 mg/dL   Calcium 02.7 (H) 8.9 - 10.3 mg/dL   Total Protein 8.0 6.5 - 8.1 g/dL   Albumin 3.5 3.5 - 5.0 g/dL   AST 50 (H) 15 - 41 U/L   ALT 26 0 - 44 U/L   Alkaline Phosphatase 90 38 - 126 U/L   Total Bilirubin 1.4 (H) <1.2 mg/dL   GFR, Estimated 30 (L) >60 mL/min    Comment: (NOTE) Calculated using the CKD-EPI Creatinine Equation (2021)    Anion gap 8 5 - 15  Comment: Performed at [facility], [address], [place], [place] [address]  CBC with Differential     Status: Abnormal   Collection Time: 09/04/23  3:20 PM  Result Value Ref Range   WBC 7.4 4.0 - 10.5 K/uL   RBC 1.60 (L) 4.22 - 5.81 MIL/uL   Hemoglobin 5.4 (LL) 13.0 - 17.0 g/dL    Comment: This critical result has verified and been called to [facility], RN by Pattricia Boss on 12 09 2024 at 1601, and has been read back.    HCT 14.7 (L) 39.0 - 52.0 %   MCV 91.9 80.0 - 100.0 fL   MCH 33.8 26.0 - 34.0 pg   MCHC 36.7 (H) 30.0 - 36.0 g/dL   RDW 69.6 (H) 29.5 - 28.4 %   Platelets 394 150 - 400 K/uL   nRBC 2.0 (H) 0.0 - 0.2 %   Neutrophils Relative % 68 %   Neutro Abs 5.1 1.7 - 7.7 K/uL   Lymphocytes Relative 21 %   Lymphs Abs 1.6 0.7 - 4.0 K/uL   Monocytes Relative 6 %   Monocytes Absolute 0.5 0.1 - 1.0 K/uL   Eosinophils Relative 3 %   Eosinophils Absolute 0.2 0.0 -  0.5 K/uL   Basophils Relative 1 %   Basophils Absolute 0.0 0.0 - 0.1 K/uL   Immature Granulocytes 1 %   Abs Immature Granulocytes 0.07 0.00 - 0.07 K/uL   Sickle Cells PRESENT    Target Cells PRESENT     Comment: Performed at [facility], [address], [place], [place] [address]  Reticulocytes     Status: Abnormal   Collection Time: 09/04/23  3:20 PM  Result Value Ref Range   Retic Ct Pct 10.1 (H) 0.4 - 3.1 %    Comment: REPEATED TO VERIFY CONFIRMED BY MANUAL DILUTION    RBC. 1.60 (L) 4.22 - 5.81 MIL/uL   Retic Count, Absolute 162.0 19.0 - 186.0 K/uL   Immature Retic Fract 27.6 (H) 2.3 - 15.9 %    Comment: Performed at [facility], [address], [place], [place] [address]  Type and screen [facility]     Status: None (Preliminary result)   Collection Time: 09/04/23  4:29 PM  Result Value Ref Range   ABO/RH(D) PENDING    Antibody Screen PENDING    Sample Expiration      09/07/2023,2359 Performed at [facility], [address], [place], [place] [address]   Prepare RBC (crossmatch)     Status: None   Collection Time: 09/04/23  4:29 PM  Result Value Ref Range   Order Confirmation      ORDER PROCESSED BY BLOOD BANK Performed at [facility], [address] [place], [place] [address]    DG Chest 2 View  Result Date: 09/04/2023 CLINICAL DATA:  Chest pain EXAM: CHEST - 2 VIEW COMPARISON:  Chest radiograph 12/07/2020 FINDINGS: Stable cardiac and mediastinal contours. Persistent bilateral hilar adenopathy. No large area pulmonary consolidation. Persistent interstitial prominence compatible with history of sarcoid. No pleural effusion or pneumothorax. IMPRESSION: Persistent bilateral hilar adenopathy. No acute process. Electronically Signed   By: Annia Belt M.D.   On: 09/04/2023 16:46    Pending Labs Unresulted Labs (From admission, onward)    None       Vitals/Pain Today's  Vitals   09/04/23 1600 09/04/23 1602 09/04/23 1626 09/04/23 1630  BP: 129/67   137/64  Pulse: 82  93  Resp: 17   20  Temp:      TempSrc:      SpO2: 97%   97%  Weight:      Height:      PainSc:  8  8      Isolation Precautions No active isolations  Medications Medications  0.9 %  sodium chloride infusion (Manually program via Guardrails IV Fluids) (has no administration in time range)  senna-docusate (Senokot-S) tablet 1 tablet (has no administration in time range)  polyethylene glycol (MIRALAX / GLYCOLAX) packet 17 g (has no administration in time range)  naloxone (NARCAN) injection 0.4 mg (has no administration in time range)    And  sodium chloride flush (NS) 0.9 % injection 9 mL (has no administration in time range)  ondansetron (ZOFRAN) injection 4 mg (has no administration in time range)  diphenhydrAMINE (BENADRYL) capsule 25 mg (has no administration in time range)  heparin injection 5,000 Units (has no administration in time range)  HYDROmorphone (DILAUDID) 1 mg/mL PCA injection (has no administration in time range)  HYDROmorphone (DILAUDID) injection 1 mg (has no administration in time range)  HYDROmorphone (DILAUDID) injection 2 mg (2 mg Intravenous Given 09/04/23 1532)  HYDROmorphone (DILAUDID) injection 2 mg (2 mg Intravenous Given 09/04/23 1603)  HYDROmorphone (DILAUDID) injection 2 mg (2 mg Intravenous Given 09/04/23 1629)  diphenhydrAMINE (BENADRYL) 12.5 mg in sodium chloride 0.9 % 50 mL IVPB (0 mg Intravenous Stopped 09/04/23 1733)    Mobility walks     Focused Assessments     R Recommendations: See Admitting Provider Note  Report given to:   Additional Notes:

## 2023-09-04 NOTE — H&P (Signed)
History and Physical    Shawn Adams FAO:[id number] DOB: 16-Oct-1975 DOA: 09/04/2023  PCP: Ivonne Andrew, NP   Patient coming from: Home   Chief Complaint: Pain   HPI: Shawn Adams is a 47 y.o. male with medical history significant for sickle cell anemia, sarcoidosis, asthma, hypertension, CKD 3B, and chronic pain who presents with severe pain, particularly in his back.  Patient reports that he began developing increased pain 5 days ago.  Pain is primarily in his back and similar to prior pain crises.  He is not finding any relief with his home medications.  He denies chest pain, cough, or fever.  ED Course: Upon arrival to the ED, patient is found to be afebrile and saturating well initially on room air with normal RR, normal HR, and stable blood pressure.  Labs are most notable for creatinine 2.55 and hemoglobin 5.4.  1 unit RBC was ordered for transfusion and the patient was treated with 3 doses of IV Dilaudid.  Review of Systems:  All other systems reviewed and apart from HPI, are negative.  Past Medical History:  Diagnosis Date   Aneurysm (HCC)    Pt states happened in 2011, Brain    Asthma    CKD (chronic kidney disease) stage 3, GFR 30-59 ml/min (HCC)    Heart murmur    Pulmonary sarcoidosis (HCC)    Sarcoid    Sickle cell anemia (HCC)     Past Surgical History:  Procedure Laterality Date   LYMPH NODE BIOPSY     NASAL SINUS SURGERY      Social History:   reports that he has quit smoking. His smoking use included cigarettes. He has never used smokeless tobacco. He reports that he does not currently use alcohol after a past usage of about 1.0 standard drink of alcohol per week. He reports that he does not currently use drugs after having used the following drugs: Marijuana.  No Known Allergies  Family History  Problem Relation Age of Onset   Diabetes Mother    Hypertension Father    Lupus Sister    Colon cancer Neg Hx    Esophageal cancer Neg Hx    Stomach  cancer Neg Hx    Rectal cancer Neg Hx      Prior to Admission medications   Medication Sig Start Date End Date Taking? Authorizing Provider  acetaminophen (TYLENOL) 500 MG tablet Take 500 mg by mouth every 6 (six) hours as needed for moderate pain or fever (pain).     [provider]  albuterol (VENTOLIN HFA) 108 (90 Base) MCG/ACT inhaler Inhale 1-2 puffs into the lungs every 6 (six) hours as needed for wheezing or shortness of breath. 08/09/23   Ivonne Andrew, NP  Ascorbic Acid (VITAMIN C) 1000 MG tablet Take 1,000 mg by mouth daily.    [provider]  budesonide-formoterol (SYMBICORT) 160-4.5 MCG/ACT inhaler Inhale 2 puffs into the lungs 2 (two) times daily. 05/10/18 09/04/23  [provider]  cyanocobalamin 50 MCG tablet Take 100 mcg by mouth daily.    [provider]  cyclobenzaprine (FLEXERIL) 10 MG tablet Take 1 tablet (10 mg total) by mouth 3 (three) times daily as needed for muscle spasms. 04/09/18   Street, [place], PA-C  docusate sodium (COLACE) 100 MG capsule Take 100 mg by mouth daily as needed for mild constipation.    [provider]  folic acid (FOLVITE) 1 MG tablet Take 1 tablet (1 mg total) by mouth daily. 02/09/16  Quentin Angst, MD  hydroxychloroquine (PLAQUENIL) 200 MG tablet Take 200 mg by mouth daily. 01/13/20   [provider]  hydroxyurea (HYDREA) 500 MG capsule Take 2 capsules (1,000 mg total) by mouth daily. May take with food to minimize GI side effects. 02/09/16   [name], Phylliss Blakes, MD  INCRUSE ELLIPTA 62.5 MCG/INH AEPB Inhale 1 puff into the lungs daily as needed (for shortness of breath). 03/17/20   [provider]  losartan (COZAAR) 25 MG tablet Take 25 mg by mouth daily.    [provider]  Melatonin 10 MG TABS Take 10 mg by mouth at bedtime.    [provider]  ondansetron (ZOFRAN) 4 MG tablet Take 4 mg by mouth every 8 (eight) hours as needed for vomiting or nausea. 11/22/21    [provider]  OXBRYTA 500 MG TABS tablet Take 1,500 mg by mouth at bedtime. 03/03/20   [provider]  Oxycodone HCl 20 MG TABS Take 1 tablet by mouth daily as needed (For pain). 08/23/22   [provider]  pantoprazole (PROTONIX) 40 MG tablet Take 1 tablet (40 mg total) by mouth daily. 11/24/22   Pyrtle, Carie Caddy, MD  sodium bicarbonate 650 MG tablet Take 650 mg by mouth daily. 10/14/19   [provider]  SYMBICORT 80-4.5 MCG/ACT inhaler Inhale 2 puffs into the lungs daily as needed (for shortness of breath). 11/24/20   [provider]  Vitamin D, Ergocalciferol, (DRISDOL) 1.25 MG (50000 UNIT) CAPS capsule TAKE 1 CAPSULE BY MOUTH EVERY 7 DAYS 11/28/22   Ivonne Andrew, NP    Physical Exam: Vitals:   09/04/23 1536 09/04/23 1540 09/04/23 1600 09/04/23 1630  BP:   129/67 137/64  Pulse: 83 82 82 93  Resp: 12 13 17 20   Temp:      TempSrc:      SpO2: (!) 84% 97% 97% 97%  Weight:      Height:        Constitutional: NAD, calm  Eyes: PERTLA, lids and conjunctivae normal ENMT: Mucous membranes are moist. Posterior pharynx clear of any exudate or lesions.   Neck: supple, no masses  Respiratory: no wheezing, no crackles. No accessory muscle use.  Cardiovascular: S1 & S2 heard, regular rate and rhythm. No extremity edema.   Abdomen: No distension, no tenderness, soft. Bowel sounds active.  Musculoskeletal: no clubbing / cyanosis. No joint deformity upper and lower extremities.   Skin: no significant rashes, lesions, ulcers. Warm, dry, well-perfused. Neurologic: CN 2-12 grossly intact. Moving all extremities. Alert and oriented.  Psychiatric: Pleasant. Cooperative.    Labs and Imaging on Admission: I have personally reviewed following labs and imaging studies  CBC: Recent Labs  Lab 09/01/23 1348 09/04/23 1520  WBC 6.4 7.4  NEUTROABS 4.2 5.1  HGB 6.2* 5.4*  HCT 18.5* 14.7*  MCV 100* 91.9  PLT 443 394   Basic Metabolic Panel: Recent Labs   Lab 09/01/23 1348 09/04/23 1520  NA 139 138  K 4.3 4.5  CL 109* 111  CO2 17* 19*  GLUCOSE 107* 92  BUN 27* 38*  CREATININE 2.59* 2.55*  CALCIUM 10.3* 10.6*   GFR: Estimated Creatinine Clearance: 32.2 mL/min (A) (by C-G formula based on SCr of 2.55 mg/dL (H)). Liver Function Tests: Recent Labs  Lab 09/01/23 1348 09/04/23 1520  AST 45* 50*  ALT 23 26  ALKPHOS 127* 90  BILITOT 1.2 1.4*  PROT 7.2 8.0  ALBUMIN 3.7* 3.5   No results for input(s): "  LIPASE", "AMYLASE" in the last 168 hours. No results for input(s): "AMMONIA" in the last 168 hours. Coagulation Profile: No results for input(s): "INR", "PROTIME" in the last 168 hours. Cardiac Enzymes: No results for input(s): "CKTOTAL", "CKMB", "CKMBINDEX", "TROPONINI" in the last 168 hours. BNP (last 3 results) No results for input(s): "PROBNP" in the last 8760 hours. HbA1C: No results for input(s): "HGBA1C" in the last 72 hours. CBG: No results for input(s): "GLUCAP" in the last 168 hours. Lipid Profile: No results for input(s): "CHOL", "HDL", "LDLCALC", "TRIG", "CHOLHDL", "LDLDIRECT" in the last 72 hours. Thyroid Function Tests: No results for input(s): "TSH", "T4TOTAL", "FREET4", "T3FREE", "THYROIDAB" in the last 72 hours. Anemia Panel: Recent Labs    09/04/23 1520  RETICCTPCT 10.1*   Urine analysis:    Component Value Date/Time   COLORURINE YELLOW 02/22/2023 1450   APPEARANCEUR CLEAR 02/22/2023 1450   APPEARANCEUR Clear 10/05/2017 1600   LABSPEC 1.010 02/22/2023 1450   PHURINE 6.0 02/22/2023 1450   GLUCOSEU NEGATIVE 02/22/2023 1450   HGBUR NEGATIVE 02/22/2023 1450   BILIRUBINUR NEGATIVE 02/22/2023 1450   BILIRUBINUR negative 01/07/2022 1249   BILIRUBINUR neg 03/18/2021 1513   BILIRUBINUR Negative 10/05/2017 1600   KETONESUR NEGATIVE 02/22/2023 1450   PROTEINUR 30 (A) 02/22/2023 1450   UROBILINOGEN 0.2 01/07/2022 1249   UROBILINOGEN 0.2 12/08/2017 1339   NITRITE NEGATIVE 02/22/2023 1450   LEUKOCYTESUR  NEGATIVE 02/22/2023 1450   Sepsis Labs: @LABRCNTIP (procalcitonin:4,lacticidven:4) )No results found for this or any previous visit (from the past 240 hour(s)).   Radiological Exams on Admission: DG Chest 2 View  Result Date: 09/04/2023 CLINICAL DATA:  Chest pain EXAM: CHEST - 2 VIEW COMPARISON:  Chest radiograph 12/07/2020 FINDINGS: Stable cardiac and mediastinal contours. Persistent bilateral hilar adenopathy. No large area pulmonary consolidation. Persistent interstitial prominence compatible with history of sarcoid. No pleural effusion or pneumothorax. IMPRESSION: Persistent bilateral hilar adenopathy. No acute process. Electronically Signed   By: Annia Belt M.D.   On: 09/04/2023 16:46    EKG: Independently reviewed. Sinus rhythm, LVH.   Assessment/Plan   1. Sickle cell anemia with pain crisis  - Transfuse 1 unit RBC, start Dilaudid PCA, hold Hydrea, avoid NSAID given AKI on CKD    2. AKI superimposed on CKD IIIB  - Likely prerenal in setting of acute on chronic anemia  - Hold losartan, transfuse 1 unit RBC, renally-dose medications    3. Hypertension  - Hold losartan in light of AKI - Treat as-needed only for now    4. Asthma; pulmonary sarcoidosis  - Not in exacerbation  - Continue ICS-LABA and as-needed albuterol     DVT prophylaxis: sq heparin  Code Status: Full  Level of Care: Level of care: Med-Surg Family Communication: None present   Disposition Plan:  Patient is from: home  Anticipated d/c is to: home Anticipated d/c date is: 09/07/23  Patient currently: Pending pain-control, stable H&H  Consults called: Non e Admission status: Inpatient     Briscoe Deutscher, MD [facility]  09/04/2023, 4:58 PM

## 2023-09-04 NOTE — ED Notes (Signed)
Pt placed on 2L [place] due to O2 dropping after pain meds

## 2023-09-05 DIAGNOSIS — D5700 Hb-SS disease with crisis, unspecified: Secondary | ICD-10-CM | POA: Diagnosis not present

## 2023-09-05 LAB — BASIC METABOLIC PANEL
Anion gap: 9 (ref 5–15)
BUN: 41 mg/dL — ABNORMAL HIGH (ref 6–20)
CO2: 19 mmol/L — ABNORMAL LOW (ref 22–32)
Calcium: 9.7 mg/dL (ref 8.9–10.3)
Chloride: 106 mmol/L (ref 98–111)
Creatinine, Ser: 2.73 mg/dL — ABNORMAL HIGH (ref 0.61–1.24)
GFR, Estimated: 28 mL/min — ABNORMAL LOW (ref >60)
Glucose, Bld: 91 mg/dL (ref 70–99)
Potassium: 4.3 mmol/L (ref 3.5–5.1)
Sodium: 134 mmol/L — ABNORMAL LOW (ref 135–145)

## 2023-09-05 LAB — CBC WITH DIFFERENTIAL/PLATELET
Abs Immature Granulocytes: 0.05 10*3/uL (ref 0.00–0.07)
Basophils Absolute: 0 10*3/uL (ref 0.0–0.1)
Basophils Relative: 0 %
Eosinophils Absolute: 0.3 10*3/uL (ref 0.0–0.5)
Eosinophils Relative: 4 %
HCT: 15.3 % — ABNORMAL LOW (ref 39.0–52.0)
Hemoglobin: 5.4 g/dL — CL (ref 13.0–17.0)
Immature Granulocytes: 1 %
Lymphocytes Relative: 25 %
Lymphs Abs: 1.8 10*3/uL (ref 0.7–4.0)
MCH: 30 pg (ref 26.0–34.0)
MCHC: 35.3 g/dL (ref 30.0–36.0)
MCV: 85 fL (ref 80.0–100.0)
Monocytes Absolute: 0.6 10*3/uL (ref 0.1–1.0)
Monocytes Relative: 8 %
Neutro Abs: 4.5 10*3/uL (ref 1.7–7.7)
Neutrophils Relative %: 62 %
Platelets: 318 10*3/uL (ref 150–400)
RBC: 1.8 MIL/uL — ABNORMAL LOW (ref 4.22–5.81)
RDW: 25.9 % — ABNORMAL HIGH (ref 11.5–15.5)
WBC: 7.2 10*3/uL (ref 4.0–10.5)
nRBC: 2.4 % — ABNORMAL HIGH (ref 0.0–0.2)

## 2023-09-05 NOTE — Progress Notes (Signed)
Subjective: Shawn Bergren is a 47 year old male with a medical history significant for sickle cell anemia, sarcoidosis, asthma, hypertension, CKD 3B, and chronic pain syndrome that was admitted with sickle cell pain crisis.  Today, patient's hemoglobin is 5.4 g/dL.  He continues to have pain mostly to his low back and lower extremities.  He rates his pain an 8/10. He denies headache, shortness of breath, urinary symptoms, nausea, vomiting, or diarrhea.  Objective:  Vital signs in last 24 hours:  Vitals:   09/05/23 0203 09/05/23 0434 09/05/23 0456 09/05/23 0900  BP: 127/76  120/75   Pulse: 69  74   Resp: 18 (!) 9 19 18   Temp: 98 F (36.7 C)  98.5 F (36.9 C)   TempSrc:      SpO2: 100% 99% 99% 100%  Weight:      Height:        Intake/Output from previous day:   Intake/Output Summary (Last 24 hours) at 09/05/2023 1610 Last data filed at 09/05/2023 0455 Gross per 24 hour  Intake 715 ml  Output --  Net 715 ml    Physical Exam: General: Alert, awake, oriented x3, in no acute distress.  HEENT: [place]/AT PEERL, EOMI Neck: Trachea midline,  no masses, no thyromegal,y no JVD, no carotid bruit OROPHARYNX:  Moist, No exudate/ erythema/lesions.  Heart: Regular rate and rhythm, without murmurs, rubs, gallops, PMI non-displaced, no heaves or thrills on palpation.  Lungs: Clear to auscultation, no wheezing or rhonchi noted. No increased vocal fremitus resonant to percussion  Abdomen: Soft, nontender, nondistended, positive bowel sounds, no masses no hepatosplenomegaly noted..  Neuro: No focal neurological deficits noted cranial nerves II through XII grossly intact. DTRs 2+ bilaterally upper and lower extremities. Strength 5 out of 5 in bilateral upper and lower extremities. Musculoskeletal: No warm swelling or erythema around joints, no spinal tenderness noted. Psychiatric: Patient alert and oriented x3, good insight and cognition, good recent to remote recall. Lymph node survey: No cervical  axillary or inguinal lymphadenopathy noted.  Lab Results:  Basic Metabolic Panel:    Component Value Date/Time   NA 138 09/04/2023 1520   NA 139 09/01/2023 1348   K 4.5 09/04/2023 1520   CL 111 09/04/2023 1520   CO2 19 (L) 09/04/2023 1520   BUN 38 (H) 09/04/2023 1520   BUN 27 (H) 09/01/2023 1348   CREATININE 2.55 (H) 09/04/2023 1520   CREATININE 1.39 (H) 05/11/2016 1100   GLUCOSE 92 09/04/2023 1520   CALCIUM 10.6 (H) 09/04/2023 1520   CBC:    Component Value Date/Time   WBC 7.4 09/04/2023 1520   HGB 5.4 (LL) 09/04/2023 1520   HGB 6.2 (LL) 09/01/2023 1348   HCT 14.7 (L) 09/04/2023 1520   HCT 18.5 (L) 09/01/2023 1348   PLT 394 09/04/2023 1520   PLT 443 09/01/2023 1348   MCV 91.9 09/04/2023 1520   MCV 100 (H) 09/01/2023 1348   NEUTROABS 5.1 09/04/2023 1520   NEUTROABS 4.2 09/01/2023 1348   LYMPHSABS 1.6 09/04/2023 1520   LYMPHSABS 1.4 09/01/2023 1348   MONOABS 0.5 09/04/2023 1520   EOSABS 0.2 09/04/2023 1520   EOSABS 0.2 09/01/2023 1348   BASOSABS 0.0 09/04/2023 1520   BASOSABS 0.1 09/01/2023 1348    No results found for this or any previous visit (from the past 240 hour(s)).  Studies/Results: DG Chest 2 View  Result Date: 09/04/2023 CLINICAL DATA:  Chest pain EXAM: CHEST - 2 VIEW COMPARISON:  Chest radiograph 12/07/2020 FINDINGS: Stable cardiac and mediastinal contours. Persistent bilateral  hilar adenopathy. No large area pulmonary consolidation. Persistent interstitial prominence compatible with history of sarcoid. No pleural effusion or pneumothorax. IMPRESSION: Persistent bilateral hilar adenopathy. No acute process. Electronically Signed   By: Annia Belt M.D.   On: 09/04/2023 16:46    Medications: Scheduled Meds:  sodium chloride   Intravenous Once   heparin  5,000 Units Subcutaneous Q8H   HYDROmorphone   Intravenous Q4H   senna-docusate  1 tablet Oral BID   Continuous Infusions: PRN Meds:.diphenhydrAMINE, HYDROmorphone (DILAUDID) injection, naloxone  **AND** sodium chloride flush, ondansetron (ZOFRAN) IV, polyethylene glycol  Consultants: none  Procedures: none  Antibiotics: none  Assessment/Plan: Principal Problem:   Sickle cell crisis (HCC) Active Problems:   Acute renal failure superimposed on stage 3b chronic kidney disease (HCC)   Pulmonary sarcoidosis (HCC)   Persistent asthma without complication   Chronic pain syndrome  Sickle cell disease with pain crisis: Decrease IV fluids to KVO Continue IV Dilaudid PCA without changes Monitor vital signs very closely, reevaluate pain scale regularly, and supplemental oxygen as needed.  Anemia of chronic disease: Hemoglobin 5.4 g/dL.  Transfused 2 units PRBCs.  Will follow labs in AM.  AKI superimposed on CKD 3B: Continue to hold losartan.  Follow labs closely.  Repeat in AM.  Hypertension: Stable.  Continue to treat.  Asthma: Pulmonary sarcoidosis: Stable.  Continue ICS/LABA and as needed albuterol  Code Status: Full Code Family Communication: N/A Disposition Plan: Not yet ready for discharge    If 7PM-7AM, please contact night-coverage.  09/05/2023, 9:37 AM  LOS: 1 day

## 2023-09-05 NOTE — TOC Initial Note (Signed)
Transition of Care [facility]) - Initial/Assessment Note    Patient Details  Name: Shawn Adams MRN: [id number] Date of Birth: April 12, 1976  Transition of Care [facility]) CM/SW Contact:    Lanier Clam, RN Phone Number: 09/05/2023, 12:32 PM  Clinical Narrative: d/c plan home.                  Expected Discharge Plan: Home/Self Care Barriers to Discharge: Continued Medical Work up   Patient Goals and CMS Choice            Expected Discharge Plan and Services                                              Prior Living Arrangements/Services                       Activities of Daily Living   ADL Screening (condition at time of admission) Independently performs ADLs?: Yes (appropriate for developmental age) Is the patient deaf or have difficulty hearing?: No Does the patient have difficulty seeing, even when wearing glasses/contacts?: No Does the patient have difficulty concentrating, remembering, or making decisions?: No  Permission Sought/Granted                  Emotional Assessment              Admission diagnosis:  Sickle cell crisis (HCC) [D57.00] Sickle cell pain crisis (HCC) [D57.00] Patient Active Problem List   Diagnosis Date Noted   Screening examination for STI 08/09/2023   Need for influenza vaccination 06/02/2023   Dental caries 09/05/2022   Need for immunization against influenza 09/05/2022   Chronic abdominal pain 07/08/2022   Chronic bronchitis, unspecified chronic bronchitis type (HCC) 03/18/2021   Cough 12/07/2020   History of COVID-19 12/07/2020   SOB (shortness of breath)    COVID-19 07/11/2020   Chest pain    Abdominal pain    Sepsis due to Escherichia coli (E. coli) (HCC) 04/09/2020   Sepsis with acute renal failure without septic shock (HCC)    Sickle cell anemia with pain (HCC) 04/07/2020   Chronic pain syndrome    Pulmonary sarcoidosis (HCC) 07/06/2018   Persistent asthma without complication 07/06/2018    Headache 05/03/2016   Anemia of chronic disease 05/03/2016   Headache disorder    Vitamin D deficiency 02/09/2016   Heart murmur 02/09/2016   Recurrent occipital headache 10/15/2015   History of aneurysm 10/13/2015   Left hip pain 07/30/2015   Left ankle pain 07/08/2015   Sickle cell crisis (HCC) 03/08/2015   Hb-SS disease without crisis (HCC) 01/29/2015   Sickle cell anemia with crisis (HCC) 12/07/2014   Sickle cell anemia (HCC) 06/20/2014   Acute renal failure superimposed on stage 3b chronic kidney disease (HCC) 06/20/2014   Leukocytosis 06/20/2014   Sickle cell pain crisis (HCC) 06/20/2014   Hypercalcemia 06/20/2014   Symptomatic anemia 05/31/2014   Acute renal failure superimposed on chronic kidney disease (HCC) 05/31/2014   PCP:  Ivonne Andrew, NP Pharmacy:   [id number] [place], [place] - [address] AT [facility] & [address] [place] [address] Phone: [phone] Fax: [phone]  [facility] [address] [place], [place] - [address] [place] [address] Phone: [phone]  Fax: [phone]     Social Determinants of Health (SDOH) Social History: SDOH Screenings   Food Insecurity: No Food Insecurity (09/04/2023)  Housing: Low Risk  (09/04/2023)  Transportation Needs: No Transportation Needs (09/04/2023)  Utilities: Not At Risk (09/04/2023)  Alcohol Screen: Low Risk  (03/23/2023)  Depression (PHQ2-9): Low Risk  (09/01/2023)  Financial Resource Strain: Low Risk  (08/15/2023)  Physical Activity: Sufficiently Active (03/23/2023)  Social Connections: Socially Integrated (03/23/2023)  Stress: No Stress Concern Present (08/22/2023)  Tobacco Use: Medium Risk (09/04/2023)   SDOH Interventions:     Readmission Risk Interventions    02/24/2023    3:37 PM  Readmission Risk Prevention Plan  Transportation Screening Complete  PCP or Specialist Appt within 5-7 Days Complete  Home  Care Screening Complete  Medication Review (RN CM) Complete

## 2023-09-05 NOTE — Plan of Care (Signed)

## 2023-09-06 LAB — CBC
HCT: 15.4 % — ABNORMAL LOW (ref 39.0–52.0)
Hemoglobin: 5.6 g/dL — CL (ref 13.0–17.0)
MCH: 31.1 pg (ref 26.0–34.0)
MCHC: 36.4 g/dL — ABNORMAL HIGH (ref 30.0–36.0)
MCV: 85.6 fL (ref 80.0–100.0)
Platelets: 294 10*3/uL (ref 150–400)
RBC: 1.8 MIL/uL — ABNORMAL LOW (ref 4.22–5.81)
RDW: 26.5 % — ABNORMAL HIGH (ref 11.5–15.5)
WBC: 7.3 10*3/uL (ref 4.0–10.5)
nRBC: 3 % — ABNORMAL HIGH (ref 0.0–0.2)

## 2023-09-06 LAB — BASIC METABOLIC PANEL
Anion gap: 7 (ref 5–15)
BUN: 40 mg/dL — ABNORMAL HIGH (ref 6–20)
CO2: 20 mmol/L — ABNORMAL LOW (ref 22–32)
Calcium: 10.3 mg/dL (ref 8.9–10.3)
Chloride: 106 mmol/L (ref 98–111)
Creatinine, Ser: 2.76 mg/dL — ABNORMAL HIGH (ref 0.61–1.24)
GFR, Estimated: 28 mL/min — ABNORMAL LOW (ref >60)
Glucose, Bld: 99 mg/dL (ref 70–99)
Potassium: 4.3 mmol/L (ref 3.5–5.1)
Sodium: 133 mmol/L — ABNORMAL LOW (ref 135–145)

## 2023-09-06 LAB — PREPARE RBC (CROSSMATCH)

## 2023-09-06 MED ORDER — ACETAMINOPHEN 325 MG PO TABS
650.0000 mg | ORAL_TABLET | Freq: Once | ORAL | Status: AC
  Administered 2023-09-06: 650 mg via ORAL
  Filled 2023-09-06: qty 2

## 2023-09-06 MED ORDER — SODIUM CHLORIDE 0.9% IV SOLUTION: Freq: Once | INTRAVENOUS | Status: AC

## 2023-09-06 NOTE — Progress Notes (Signed)
Subjective: Shawn Adams is a 47 year old male with a medical history significant for sickle cell anemia, sarcoidosis, asthma, hypertension, CKD 3B, and chronic pain syndrome that was admitted with sickle cell pain crisis.  Patient's hemoglobin is 5.4 g/dL, PRBCs currently infusing.  He continues to have pain mostly to his low back and lower extremities.  He rates his pain an 8/10. He denies headache, shortness of breath, urinary symptoms, nausea, vomiting, or diarrhea.  Objective:  Vital signs in last 24 hours:  Vitals:   09/06/23 0308 09/06/23 0430 09/06/23 0437 09/06/23 0500  BP: 131/82  134/72 139/80  Pulse: 89  93 98  Resp: 16 16 16 16   Temp: 99.5 F (37.5 C)  (!) 100.4 F (38 C) 98 F (36.7 C)  TempSrc: Oral  Oral Oral  SpO2: 93% 93% 92% 92%  Weight:      Height:        Intake/Output from previous day:   Intake/Output Summary (Last 24 hours) at 09/06/2023 0733 Last data filed at 09/06/2023 0451 Gross per 24 hour  Intake 665 ml  Output 2350 ml  Net -1685 ml    Physical Exam: General: Alert, awake, oriented x3, in no acute distress.  HEENT: [place]/AT PEERL, EOMI Neck: Trachea midline,  no masses, no thyromegal,y no JVD, no carotid bruit OROPHARYNX:  Moist, No exudate/ erythema/lesions.  Heart: Regular rate and rhythm, without murmurs, rubs, gallops, PMI non-displaced, no heaves or thrills on palpation.  Lungs: Clear to auscultation, no wheezing or rhonchi noted. No increased vocal fremitus resonant to percussion  Abdomen: Soft, nontender, nondistended, positive bowel sounds, no masses no hepatosplenomegaly noted..  Neuro: No focal neurological deficits noted cranial nerves II through XII grossly intact. DTRs 2+ bilaterally upper and lower extremities. Strength 5 out of 5 in bilateral upper and lower extremities. Musculoskeletal: No warm swelling or erythema around joints, no spinal tenderness noted. Psychiatric: Patient alert and oriented x3, good insight and cognition,  good recent to remote recall. Lymph node survey: No cervical axillary or inguinal lymphadenopathy noted.  Lab Results:  Basic Metabolic Panel:    Component Value Date/Time   NA 133 (L) 09/06/2023 0049   NA 139 09/01/2023 1348   K 4.3 09/06/2023 0049   CL 106 09/06/2023 0049   CO2 20 (L) 09/06/2023 0049   BUN 40 (H) 09/06/2023 0049   BUN 27 (H) 09/01/2023 1348   CREATININE 2.76 (H) 09/06/2023 0049   CREATININE 1.39 (H) 05/11/2016 1100   GLUCOSE 99 09/06/2023 0049   CALCIUM 10.3 09/06/2023 0049   CBC:    Component Value Date/Time   WBC 7.3 09/06/2023 0049   HGB 5.6 (LL) 09/06/2023 0049   HGB 6.2 (LL) 09/01/2023 1348   HCT 15.4 (L) 09/06/2023 0049   HCT 18.5 (L) 09/01/2023 1348   PLT 294 09/06/2023 0049   PLT 443 09/01/2023 1348   MCV 85.6 09/06/2023 0049   MCV 100 (H) 09/01/2023 1348   NEUTROABS 4.5 09/05/2023 0951   NEUTROABS 4.2 09/01/2023 1348   LYMPHSABS 1.8 09/05/2023 0951   LYMPHSABS 1.4 09/01/2023 1348   MONOABS 0.6 09/05/2023 0951   EOSABS 0.3 09/05/2023 0951   EOSABS 0.2 09/01/2023 1348   BASOSABS 0.0 09/05/2023 0951   BASOSABS 0.1 09/01/2023 1348    No results found for this or any previous visit (from the past 240 hour(s)).  Studies/Results: DG Chest 2 View  Result Date: 09/04/2023 CLINICAL DATA:  Chest pain EXAM: CHEST - 2 VIEW COMPARISON:  Chest radiograph 12/07/2020 FINDINGS: Stable  cardiac and mediastinal contours. Persistent bilateral hilar adenopathy. No large area pulmonary consolidation. Persistent interstitial prominence compatible with history of sarcoid. No pleural effusion or pneumothorax. IMPRESSION: Persistent bilateral hilar adenopathy. No acute process. Electronically Signed   By: Annia Belt M.D.   On: 09/04/2023 16:46    Medications: Scheduled Meds:  sodium chloride   Intravenous Once   heparin  5,000 Units Subcutaneous Q8H   HYDROmorphone   Intravenous Q4H   senna-docusate  1 tablet Oral BID   Continuous Infusions: PRN  Meds:.diphenhydrAMINE, HYDROmorphone (DILAUDID) injection, naloxone **AND** sodium chloride flush, ondansetron (ZOFRAN) IV, polyethylene glycol  Consultants: none  Procedures: none  Antibiotics: none  Assessment/Plan: Principal Problem:   Sickle cell crisis (HCC) Active Problems:   Acute renal failure superimposed on stage 3b chronic kidney disease (HCC)   Pulmonary sarcoidosis (HCC)   Persistent asthma without complication   Chronic pain syndrome  Sickle cell disease with pain crisis: Decrease IV fluids to KVO Continue IV Dilaudid PCA without changes Monitor vital signs very closely, reevaluate pain scale regularly, and supplemental oxygen as needed.  Anemia of chronic disease: Hemoglobin 5.4 g/dL.  Transfuse 2 units PRBCs.  Will follow labs in AM.  AKI superimposed on CKD 3B: Stable. Continue to hold losartan.  Follow labs closely.  Repeat in AM.  Hypertension: Stable.  Continue to treat.  Asthma: Pulmonary sarcoidosis: Stable.  Continue ICS/LABA and as needed albuterol  Code Status: Full Code Family Communication: N/A Disposition Plan: Not yet ready for discharge  Nolon Nations  APRN, MSN, FNP-C [facility] [address] [place], [place] [address] [phone]   If 7PM-7AM, please contact night-coverage.  09/06/2023, 7:33 AM  LOS: 2 days

## 2023-09-07 DIAGNOSIS — D5700 Hb-SS disease with crisis, unspecified: Secondary | ICD-10-CM | POA: Diagnosis not present

## 2023-09-07 LAB — TYPE AND SCREEN
ABO/RH(D): B POS
Antibody Screen: NEGATIVE
Unit division: 0
Unit division: 0

## 2023-09-07 LAB — BASIC METABOLIC PANEL
Anion gap: 8 (ref 5–15)
BUN: 37 mg/dL — ABNORMAL HIGH (ref 6–20)
CO2: 19 mmol/L — ABNORMAL LOW (ref 22–32)
Calcium: 10.7 mg/dL — ABNORMAL HIGH (ref 8.9–10.3)
Chloride: 109 mmol/L (ref 98–111)
Creatinine, Ser: 2.48 mg/dL — ABNORMAL HIGH (ref 0.61–1.24)
GFR, Estimated: 31 mL/min — ABNORMAL LOW (ref >60)
Glucose, Bld: 94 mg/dL (ref 70–99)
Potassium: 4.3 mmol/L (ref 3.5–5.1)
Sodium: 136 mmol/L (ref 135–145)

## 2023-09-07 LAB — CBC WITH DIFFERENTIAL/PLATELET
Abs Immature Granulocytes: 0.03 10*3/uL (ref 0.00–0.07)
Basophils Absolute: 0.1 10*3/uL (ref 0.0–0.1)
Basophils Relative: 1 %
Eosinophils Absolute: 0.3 10*3/uL (ref 0.0–0.5)
Eosinophils Relative: 5 %
HCT: 20.7 % — ABNORMAL LOW (ref 39.0–52.0)
Hemoglobin: 7.2 g/dL — ABNORMAL LOW (ref 13.0–17.0)
Immature Granulocytes: 1 %
Lymphocytes Relative: 35 %
Lymphs Abs: 1.9 10*3/uL (ref 0.7–4.0)
MCH: 29.6 pg (ref 26.0–34.0)
MCHC: 34.8 g/dL (ref 30.0–36.0)
MCV: 85.2 fL (ref 80.0–100.0)
Monocytes Absolute: 0.5 10*3/uL (ref 0.1–1.0)
Monocytes Relative: 10 %
Neutro Abs: 2.6 10*3/uL (ref 1.7–7.7)
Neutrophils Relative %: 48 %
Platelets: 355 10*3/uL (ref 150–400)
RBC: 2.43 MIL/uL — ABNORMAL LOW (ref 4.22–5.81)
RDW: 24.7 % — ABNORMAL HIGH (ref 11.5–15.5)
WBC: 5.4 10*3/uL (ref 4.0–10.5)
nRBC: 3.5 % — ABNORMAL HIGH (ref 0.0–0.2)

## 2023-09-07 LAB — BPAM RBC
Blood Product Expiration Date: 202501072359
Blood Product Expiration Date: 202501182359
ISSUE DATE / TIME: 202412100003
ISSUE DATE / TIME: 202412110446
Unit Type and Rh: 202501072359
Unit Type and Rh: 5100
Unit Type and Rh: 5100

## 2023-09-07 MED ORDER — OXYCODONE HCL 5 MG PO TABS: 10.0000 mg | ORAL_TABLET | ORAL | Status: DC | PRN

## 2023-09-07 NOTE — Progress Notes (Addendum)
Subjective: Shawn Adams is a 47 year old male with a medical history significant for sickle cell anemia, sarcoidosis, asthma, hypertension, CKD 3B, and chronic pain syndrome that was admitted with sickle cell pain crisis.  Patient continues to complain of pain primarily to low back and lower extremities. He rates pain as 9/10.   He denies headache, shortness of breath, urinary symptoms, nausea, vomiting, or diarrhea.  Objective:  Vital signs in last 24 hours:  Vitals:   09/07/23 0431 09/07/23 0748 09/07/23 0748 09/07/23 1211  BP: (!) 154/134  (!) 149/65 137/66  Pulse: 78  78 72  Resp: 16 11 20 18   Temp: 98.3 F (36.8 C)  98 F (36.7 C) 98.7 F (37.1 C)  TempSrc:   Oral Oral  SpO2: 95% 95% 95% 96%  Weight:      Height:        Intake/Output from previous day:   Intake/Output Summary (Last 24 hours) at 09/07/2023 1349 Last data filed at 09/07/2023 1300 Gross per 24 hour  Intake 270.1 ml  Output 2475 ml  Net -2204.9 ml    Physical Exam: General: Alert, awake, oriented x3, in no acute distress.  HEENT: [place]/AT PEERL, EOMI Neck: Trachea midline,  no masses, no thyromegal,y no JVD, no carotid bruit OROPHARYNX:  Moist, No exudate/ erythema/lesions.  Heart: Regular rate and rhythm, without murmurs, rubs, gallops, PMI non-displaced, no heaves or thrills on palpation.  Lungs: Clear to auscultation, no wheezing or rhonchi noted. No increased vocal fremitus resonant to percussion  Abdomen: Soft, nontender, nondistended, positive bowel sounds, no masses no hepatosplenomegaly noted..  Neuro: No focal neurological deficits noted cranial nerves II through XII grossly intact. DTRs 2+ bilaterally upper and lower extremities. Strength 5 out of 5 in bilateral upper and lower extremities. Musculoskeletal: No warm swelling or erythema around joints, no spinal tenderness noted. Psychiatric: Patient alert and oriented x3, good insight and cognition, good recent to remote recall. Lymph node  survey: No cervical axillary or inguinal lymphadenopathy noted.  Lab Results:  Basic Metabolic Panel:    Component Value Date/Time   NA 133 (L) 09/06/2023 0049   NA 139 09/01/2023 1348   K 4.3 09/06/2023 0049   CL 106 09/06/2023 0049   CO2 20 (L) 09/06/2023 0049   BUN 40 (H) 09/06/2023 0049   BUN 27 (H) 09/01/2023 1348   CREATININE 2.76 (H) 09/06/2023 0049   CREATININE 1.39 (H) 05/11/2016 1100   GLUCOSE 99 09/06/2023 0049   CALCIUM 10.3 09/06/2023 0049   CBC:    Component Value Date/Time   WBC 7.3 09/06/2023 0049   HGB 5.6 (LL) 09/06/2023 0049   HGB 6.2 (LL) 09/01/2023 1348   HCT 15.4 (L) 09/06/2023 0049   HCT 18.5 (L) 09/01/2023 1348   PLT 294 09/06/2023 0049   PLT 443 09/01/2023 1348   MCV 85.6 09/06/2023 0049   MCV 100 (H) 09/01/2023 1348   NEUTROABS 4.5 09/05/2023 0951   NEUTROABS 4.2 09/01/2023 1348   LYMPHSABS 1.8 09/05/2023 0951   LYMPHSABS 1.4 09/01/2023 1348   MONOABS 0.6 09/05/2023 0951   EOSABS 0.3 09/05/2023 0951   EOSABS 0.2 09/01/2023 1348   BASOSABS 0.0 09/05/2023 0951   BASOSABS 0.1 09/01/2023 1348    No results found for this or any previous visit (from the past 240 hours).  Studies/Results: No results found.  Medications: Scheduled Meds:  sodium chloride   Intravenous Once   heparin  5,000 Units Subcutaneous Q8H   HYDROmorphone   Intravenous Q4H   senna-docusate  1 tablet Oral BID   Continuous Infusions: PRN Meds:.diphenhydrAMINE, HYDROmorphone (DILAUDID) injection, naloxone **AND** sodium chloride flush, ondansetron (ZOFRAN) IV, polyethylene glycol  Consultants: none  Procedures: none  Antibiotics: none  Assessment/Plan: Principal Problem:   Sickle cell crisis (HCC) Active Problems:   Acute renal failure superimposed on stage 3b chronic kidney disease (HCC)   Pulmonary sarcoidosis (HCC)   Persistent asthma without complication   Chronic pain syndrome  Sickle cell disease with pain crisis: Continue IV Dilaudid PCA without  changes Monitor vital signs very closely, reevaluate pain scale regularly, and supplemental oxygen as needed.  Anemia of chronic disease: Hemoglobin 7.2 g/dL.  S/p 2 units PRBCs.  Will follow labs in am.   AKI superimposed on CKD 3B: Stable.Improving.  Continue to hold losartan.  Follow labs closely.  Repeat in AM.  Hypertension: Stable.  Continue to treat.  Asthma: Pulmonary sarcoidosis: Stable.  Continue ICS/LABA and as needed albuterol  Code Status: Full Code Family Communication: N/A Disposition Plan: Not yet ready for discharge  Nolon Nations  APRN, MSN, FNP-C [facility] [address] [place], [place] [address] [phone]   If 7PM-7AM, please contact night-coverage.  09/07/2023, 1:49 PM  LOS: 3 days

## 2023-09-08 DIAGNOSIS — D5700 Hb-SS disease with crisis, unspecified: Secondary | ICD-10-CM | POA: Diagnosis not present

## 2023-09-08 LAB — CBC WITH DIFFERENTIAL/PLATELET
Abs Immature Granulocytes: 0.04 10*3/uL (ref 0.00–0.07)
Basophils Absolute: 0 10*3/uL (ref 0.0–0.1)
Basophils Relative: 1 %
Eosinophils Absolute: 0.3 10*3/uL (ref 0.0–0.5)
Eosinophils Relative: 6 %
HCT: 19.9 % — ABNORMAL LOW (ref 39.0–52.0)
Hemoglobin: 6.7 g/dL — CL (ref 13.0–17.0)
Immature Granulocytes: 1 %
Lymphocytes Relative: 26 %
Lymphs Abs: 1.5 10*3/uL (ref 0.7–4.0)
MCH: 29.4 pg (ref 26.0–34.0)
MCHC: 33.7 g/dL (ref 30.0–36.0)
MCV: 87.3 fL (ref 80.0–100.0)
Monocytes Absolute: 0.6 10*3/uL (ref 0.1–1.0)
Monocytes Relative: 10 %
Neutro Abs: 3.4 10*3/uL (ref 1.7–7.7)
Neutrophils Relative %: 56 %
Platelet Morphology: NORMAL
Platelets: 310 10*3/uL (ref 150–400)
RBC: 2.28 MIL/uL — ABNORMAL LOW (ref 4.22–5.81)
RDW: 24.1 % — ABNORMAL HIGH (ref 11.5–15.5)
WBC: 6 10*3/uL (ref 4.0–10.5)
nRBC: 2.2 % — ABNORMAL HIGH (ref 0.0–0.2)

## 2023-09-08 LAB — COMPREHENSIVE METABOLIC PANEL
ALT: 25 U/L (ref 0–44)
AST: 46 U/L — ABNORMAL HIGH (ref 15–41)
Albumin: 3.3 g/dL — ABNORMAL LOW (ref 3.5–5.0)
Alkaline Phosphatase: 93 U/L (ref 38–126)
Anion gap: 8 (ref 5–15)
BUN: 39 mg/dL — ABNORMAL HIGH (ref 6–20)
CO2: 19 mmol/L — ABNORMAL LOW (ref 22–32)
Calcium: 10.8 mg/dL — ABNORMAL HIGH (ref 8.9–10.3)
Chloride: 107 mmol/L (ref 98–111)
Creatinine, Ser: 2.53 mg/dL — ABNORMAL HIGH (ref 0.61–1.24)
GFR, Estimated: 31 mL/min — ABNORMAL LOW (ref >60)
Glucose, Bld: 94 mg/dL (ref 70–99)
Potassium: 4.2 mmol/L (ref 3.5–5.1)
Sodium: 134 mmol/L — ABNORMAL LOW (ref 135–145)
Total Bilirubin: 1.5 mg/dL — ABNORMAL HIGH (ref <1.2)
Total Protein: 7.3 g/dL (ref 6.5–8.1)

## 2023-09-08 LAB — LACTATE DEHYDROGENASE: LDH: 386 U/L — ABNORMAL HIGH (ref 98–192)

## 2023-09-08 MED ORDER — LOSARTAN POTASSIUM 25 MG PO TABS
25.0000 mg | ORAL_TABLET | Freq: Every day | ORAL | Status: DC
  Administered 2023-09-08: 25 mg via ORAL
  Filled 2023-09-08: qty 1

## 2023-09-08 NOTE — Plan of Care (Signed)
  Problem: Self-Care: Goal: Ability to incorporate actions that prevent/reduce pain crisis will improve Outcome: Progressing   Problem: Tissue Perfusion: Goal: Complications related to inadequate tissue perfusion will be avoided or minimized Outcome: Progressing   Problem: Respiratory: Goal: Pulmonary complications will be avoided or minimized Outcome: Progressing   Problem: Education: Goal: Knowledge of General Education information will improve Description: Including pain rating scale, medication(s)/side effects and non-pharmacologic comfort measures Outcome: Progressing   Problem: Nutrition: Goal: Adequate nutrition will be maintained Outcome: Progressing   Problem: Skin Integrity: Goal: Risk for impaired skin integrity will decrease Outcome: Progressing

## 2023-09-08 NOTE — Plan of Care (Addendum)
  Problem: Self-Care: Goal: Ability to incorporate actions that prevent/reduce pain crisis will improve Outcome: Progressing   Problem: Tissue Perfusion: Goal: Complications related to inadequate tissue perfusion will be avoided or minimized Outcome: Progressing   Problem: Respiratory: Goal: Pulmonary complications will be avoided or minimized Outcome: Progressing   Problem: Education: Goal: Knowledge of General Education information will improve Description: Including pain rating scale, medication(s)/side effects and non-pharmacologic comfort measures Outcome: Progressing   Problem: Nutrition: Goal: Adequate nutrition will be maintained Outcome: Progressing   Problem: Skin Integrity: Goal: Risk for impaired skin integrity will decrease Outcome: Progressing     AVS was printed with, reviewed with  and given to the patient. The patient verbalized understanding its content. The vital sign was taken and it was normal and the patient was in a stable condition during the discharge. Marland Kitchen

## 2023-09-08 NOTE — Plan of Care (Signed)
  Problem: Self-Care: Goal: Ability to incorporate actions that prevent/reduce pain crisis will improve Outcome: Progressing   Problem: Bowel/Gastric: Goal: Gut motility will be maintained Outcome: Progressing   Problem: Health Behavior/Discharge Planning: Goal: Ability to manage health-related needs will improve Outcome: Progressing   Problem: Activity: Goal: Risk for activity intolerance will decrease Outcome: Progressing   Problem: Coping: Goal: Level of anxiety will decrease Outcome: Progressing   Problem: Pain Management: Goal: General experience of comfort will improve Outcome: Progressing   Problem: Skin Integrity: Goal: Risk for impaired skin integrity will decrease Outcome: Progressing

## 2023-09-08 NOTE — TOC Transition Note (Signed)
Transition of Care [facility]) - Discharge Note   Patient Details  Name: Shawn Adams MRN: [id number] Date of Birth: [birth year]  Transition of Care [facility]) CM/SW Contact:  Lanier Clam, RN Phone Number: 09/08/2023, 2:45 PM   Clinical Narrative: d/c home.No needs.      Final next level of care: Home/Self Care Barriers to Discharge: No Barriers Identified   Patient Goals and CMS Choice            Discharge Placement                       Discharge Plan and Services Additional resources added to the After Visit Summary for                                       Social Drivers of Health (SDOH) Interventions SDOH Screenings   Food Insecurity: No Food Insecurity (09/04/2023)  Housing: Low Risk  (09/04/2023)  Transportation Needs: No Transportation Needs (09/04/2023)  Utilities: Not At Risk (09/04/2023)  Alcohol Screen: Low Risk  (03/23/2023)  Depression (PHQ2-9): Low Risk  (09/01/2023)  Financial Resource Strain: Low Risk  (08/15/2023)  Physical Activity: Sufficiently Active (03/23/2023)  Social Connections: Socially Integrated (03/23/2023)  Stress: No Stress Concern Present (08/22/2023)  Tobacco Use: Medium Risk (09/04/2023)     Readmission Risk Interventions    02/24/2023    3:37 PM  Readmission Risk Prevention Plan  Transportation Screening Complete  PCP or Specialist Appt within 5-7 Days Complete  Home Care Screening Complete  Medication Review (RN CM) Complete

## 2023-09-08 NOTE — Discharge Summary (Signed)
Physician Discharge Summary  Shawn Adams WUJ:[id number] DOB: November 14, 1975 DOA: 09/04/2023  PCP: Ivonne Andrew, NP  Admit date: 09/04/2023  Discharge date: 09/08/2023  Discharge Diagnoses:  Principal Problem:   Sickle cell crisis (HCC) Active Problems:   Acute renal failure superimposed on stage 3b chronic kidney disease (HCC)   Pulmonary sarcoidosis (HCC)   Persistent asthma without complication   Chronic pain syndrome   Discharge Condition: Stable  Disposition:   Follow-up Information     Ivonne Andrew, NP. Schedule an appointment as soon as possible for a visit in 1 week(s).   Specialties: Pulmonary Disease, Endocrinology Contact information: [address] [place] [address] [phone]                Pt is discharged home in good condition and is to follow up with Ivonne Andrew, NP this week to have labs evaluated. Shawn Adams is instructed to increase activity slowly and balance with rest for the next few days, and use prescribed medication to complete treatment of pain  Diet: Regular Wt Readings from Last 3 Encounters:  09/04/23 63.5 kg  09/01/23 63.4 kg  08/09/23 62.1 kg    History of present illness:  Shawn Adams is a 47 y.o. male with medical history significant for sickle cell anemia, sarcoidosis, asthma, hypertension, CKD 3B, and chronic pain who presents with severe pain, particularly in his back.   Patient reports that he began developing increased pain 5 days ago.  Pain is primarily in his back and similar to prior pain crises.  He is not finding any relief with his home medications.  He denies chest pain, cough, or fever.   ED Course: Upon arrival to the ED, patient is found to be afebrile and saturating well initially on room air with normal RR, normal HR, and stable blood pressure.  Labs are most notable for creatinine 2.55 and hemoglobin 5.4.   1 unit RBC was ordered for transfusion and the patient was treated with 3 doses  of IV Dilaudid.   Hospital Course:  Patient was admitted for sickle cell anemia with pain crisis, AKI superimposed on CKD 3B and managed appropriately with blood transfusion, IVF, IV Dilaudid via PCA as well as other adjunct therapies per sickle cell pain management protocols.  Toradol was contraindicated due to CKD.  Patient's hemoglobin dropped to another 5.4 for which she was transfused with total of 2 units packed red blood cells with improvement in hemoglobin to 7.2 which is consistent with his baseline.  Serum creatinine trended down towards baseline.  His hemoglobin remained stable.  Pain slowly returned to baseline.  As at today, patient is tolerating p.o. intake with no restrictions and ambulating well with no significant pain.  Patient requested to be discharged, saying his pain can be managed at home.  Patient has been hemodynamically stable throughout his admission. Patient was therefore discharged home today in a hemodynamically stable condition.   Taekwon will follow-up with PCP within 1 week of this discharge. Labron was counseled extensively about nonpharmacologic means of pain management, patient verbalized understanding and was appreciative of  the care received during this admission.   We discussed the need for good hydration, monitoring of hydration status, avoidance of heat, cold, stress, and infection triggers. We discussed the need to be adherent with taking Hydrea and other home medications. Patient was reminded of the need to seek medical attention immediately if any symptom of bleeding, anemia, or infection occurs.  Discharge Exam:  Vitals:   09/08/23 0857 09/08/23 1156  BP:    Pulse:    Resp: 12 14  Temp:    SpO2: 97% 95%   Vitals:   09/08/23 0500 09/08/23 0804 09/08/23 0857 09/08/23 1156  BP:  (!) 145/115    Pulse:  83    Resp: 16 16 12 14   Temp:  98.4 F (36.9 C)    TempSrc:      SpO2: 98% 97% 97% 95%  Weight:      Height:       General appearance : Awake, alert,  not in any distress. Speech Clear. Not toxic looking HEENT: Atraumatic and Normocephalic, pupils equally reactive to light and accomodation Neck: Supple, no JVD. No cervical lymphadenopathy.  Chest: Good air entry bilaterally, no added sounds  CVS: S1 S2 regular, no murmurs.  Abdomen: Bowel sounds present, Non tender and not distended with no gaurding, rigidity or rebound. Extremities: B/L Lower Ext shows no edema, both legs are warm to touch Neurology: Awake alert, and oriented X 3, CN II-XII intact, Non focal Skin: No Rash  Discharge Instructions  Discharge Instructions     Diet - low sodium heart healthy   Complete by: As directed    Increase activity slowly   Complete by: As directed       Allergies as of 09/08/2023   No Known Allergies      Medication List     TAKE these medications    acetaminophen 500 MG tablet Commonly known as: TYLENOL Take 500 mg by mouth every 6 (six) hours as needed for moderate pain or fever (pain).   albuterol 108 (90 Base) MCG/ACT inhaler Commonly known as: VENTOLIN HFA Inhale 1-2 puffs into the lungs every 6 (six) hours as needed for wheezing or shortness of breath.   cyanocobalamin 50 MCG tablet Take 100 mcg by mouth daily.   docusate sodium 100 MG capsule Commonly known as: COLACE Take 100 mg by mouth daily as needed for mild constipation.   hydroxychloroquine 200 MG tablet Commonly known as: PLAQUENIL Take 200 mg by mouth daily.   hydroxyurea 500 MG capsule Commonly known as: HYDREA Take 2 capsules (1,000 mg total) by mouth daily. May take with food to minimize GI side effects.   losartan 25 MG tablet Commonly known as: COZAAR Take 25 mg by mouth daily.   Melatonin 10 MG Tabs Take 10 mg by mouth at bedtime.   ondansetron 4 MG tablet Commonly known as: ZOFRAN Take 4 mg by mouth every 8 (eight) hours as needed for vomiting or nausea.   Oxycodone HCl 20 MG Tabs Take 1 tablet by mouth daily as needed (For pain).    pantoprazole 40 MG tablet Commonly known as: PROTONIX Take 1 tablet (40 mg total) by mouth daily.   sodium bicarbonate 650 MG tablet Take 650 mg by mouth daily.   Symbicort 160-4.5 MCG/ACT inhaler Generic drug: budesonide-formoterol Inhale 2 puffs into the lungs 2 (two) times daily.   VITAMIN C PO Take 1 tablet by mouth daily.   Vitamin D (Ergocalciferol) 1.25 MG (50000 UNIT) Caps capsule Commonly known as: DRISDOL TAKE 1 CAPSULE BY MOUTH EVERY 7 DAYS        The results of significant diagnostics from this hospitalization (including imaging, microbiology, ancillary and laboratory) are listed below for reference.    Significant Diagnostic Studies: DG Chest 2 View Result Date: 09/04/2023 CLINICAL DATA:  Chest pain EXAM: CHEST - 2 VIEW COMPARISON:  Chest radiograph 12/07/2020 FINDINGS: Stable  cardiac and mediastinal contours. Persistent bilateral hilar adenopathy. No large area pulmonary consolidation. Persistent interstitial prominence compatible with history of sarcoid. No pleural effusion or pneumothorax. IMPRESSION: Persistent bilateral hilar adenopathy. No acute process. Electronically Signed   By: Annia Belt M.D.   On: 09/04/2023 16:46    Microbiology: No results found for this or any previous visit (from the past 240 hours).   Labs: Basic Metabolic Panel: Recent Labs  Lab 09/04/23 1520 09/05/23 0951 09/06/23 0049 09/07/23 1427 09/08/23 0654  NA 138 134* 133* 136 134*  K 4.5 4.3 4.3 4.3 4.2  CL 111 106 106 109 107  CO2 19* 19* 20* 19* 19*  GLUCOSE 92 91 99 94 94  BUN 38* 41* 40* 37* 39*  CREATININE 2.55* 2.73* 2.76* 2.48* 2.53*  CALCIUM 10.6* 9.7 10.3 10.7* 10.8*   Liver Function Tests: Recent Labs  Lab 09/04/23 1520 09/08/23 0654  AST 50* 46*  ALT 26 25  ALKPHOS 90 93  BILITOT 1.4* 1.5*  PROT 8.0 7.3  ALBUMIN 3.5 3.3*   No results for input(s): "LIPASE", "AMYLASE" in the last 168 hours. No results for input(s): "AMMONIA" in the last 168  hours. CBC: Recent Labs  Lab 09/04/23 1520 09/05/23 0951 09/06/23 0049 09/07/23 1427 09/08/23 0654  WBC 7.4 7.2 7.3 5.4 6.0  NEUTROABS 5.1 4.5  --  2.6 3.4  HGB 5.4* 5.4* 5.6* 7.2* 6.7*  HCT 14.7* 15.3* 15.4* 20.7* 19.9*  MCV 91.9 85.0 85.6 85.2 87.3  PLT 394 318 294 355 310   Cardiac Enzymes: No results for input(s): "CKTOTAL", "CKMB", "CKMBINDEX", "TROPONINI" in the last 168 hours. BNP: Invalid input(s): "POCBNP" CBG: No results for input(s): "GLUCAP" in the last 168 hours.  Time coordinating discharge: 50 minutes  Signed:  Daray Polgar  [facility] 09/08/2023, 2:30 PM

## 2023-09-11 ENCOUNTER — Telehealth: Payer: Self-pay

## 2023-09-11 NOTE — Transitions of Care (Post Inpatient/ED Visit) (Signed)
   09/11/2023  Name: Shawn Adams MRN: [id number] DOB: [birth year]  Today's TOC FU Call Status: Today's TOC FU Call Status:: Unsuccessful Call (1st Attempt) Unsuccessful Call (1st Attempt) Date: 09/11/23  Attempted to reach the patient regarding the most recent Inpatient/ED visit.  Follow Up Plan: Additional outreach attempts will be made to reach the patient to complete the Transitions of Care (Post Inpatient/ED visit) call.   Wilho Sharpley Daphine Deutscher BSN, RN RN Care Manager   Transitions of Care [facility] Direct Dial Number:  [phone]

## 2023-09-12 ENCOUNTER — Telehealth: Payer: Self-pay

## 2023-09-12 NOTE — Transitions of Care (Post Inpatient/ED Visit) (Signed)
09/12/2023  Name: Shawn Adams MRN: [id number] DOB: 30-Dec-1975  Today's TOC FU Call Status: Today's TOC FU Call Status:: Successful TOC FU Call Completed TOC FU Call Complete Date: 09/12/23 Patient's Name and Date of Birth confirmed.  Transition Care Management Follow-up Telephone Call Date of Discharge: 09/08/23 Discharge Facility: [facility]) Type of Discharge: Inpatient Admission How have you been since you were released from the hospital?: Better ("My pain is much better and if I have any, my pain medicine helps") Any questions or concerns?: No  Items Reviewed: Did you receive and understand the discharge instructions provided?: Yes Medications obtained,verified, and reconciled?: Yes (Medications Reviewed) Any new allergies since your discharge?: No Dietary orders reviewed?: Yes Type of Diet Ordered:: Low Sodium Do you have support at home?: Yes People in Home: spouse  Medications Reviewed Today: Medications Reviewed Today     Reviewed by Wyline Mood, RN (Case Manager) on 09/12/23 at 1154  Med List Status: <None>   Medication Order Taking? Sig Documenting Provider Last Dose Status Informant  acetaminophen (TYLENOL) 500 MG tablet [id number] Yes Take 500 mg by mouth every 6 (six) hours as needed for moderate pain or fever (pain).  [provider] Taking Active Self, Pharmacy Records           Med Note (SATTERFIELD, Genoveva Ill   Wed Feb 22, 2023  6:01 PM)    albuterol (VENTOLIN HFA) 108 (90 Base) MCG/ACT inhaler [id number] Yes Inhale 1-2 puffs into the lungs every 6 (six) hours as needed for wheezing or shortness of breath. Ivonne Andrew, NP Taking Active Self, Pharmacy Records  Ascorbic Acid (VITAMIN C PO) [id number] Yes Take 1 tablet by mouth daily. [provider] Taking Active Self, Pharmacy Records  budesonide-formoterol [facility]) 160-4.5 MCG/ACT inhaler [id number]  Inhale 2 puffs into the lungs 2 (two) times daily. [provider]   Expired 09/04/23 2359 Self, Pharmacy Records           Med Note Wyline Mood   Tue Sep 12, 2023 11:54 AM) 12/17 - Patient states he is taking. AM  cyanocobalamin 50 MCG tablet [id number] Yes Take 100 mcg by mouth daily. [provider] Taking Active Self, Pharmacy Records  docusate sodium (COLACE) 100 MG capsule [id number] Yes Take 100 mg by mouth daily as needed for mild constipation. [provider] Taking Active Self, Pharmacy Records           Med Note Tobias Alexander Sep 04, 2023  4:56 PM)    hydroxychloroquine (PLAQUENIL) 200 MG tablet [id number] No Take 200 mg by mouth daily.  Patient not taking: Reported on 09/12/2023   [provider] Not Taking Active Self, Pharmacy Records  hydroxyurea (HYDREA) 500 MG capsule [id number] Yes Take 2 capsules (1,000 mg total) by mouth daily. May take with food to minimize GI side effects. Quentin Angst, MD Taking Active Self, Pharmacy Records           Med Note Montez Morita, [place] D   Thu Sep 13, 2018  5:52 AM)    losartan (COZAAR) 25 MG tablet [id number] Yes Take 25 mg by mouth daily. [provider] Taking Active Self, Pharmacy Records  Melatonin 10 MG TABS [id number] Yes Take 10 mg by mouth at bedtime. [provider] Taking Active Self, Pharmacy Records           Med Note (SATTERFIELD, Genoveva Ill   Wed Feb 22, 2023  6:02 PM)    ondansetron (ZOFRAN) 4 MG  tablet [id number] Yes Take 4 mg by mouth every 8 (eight) hours as needed for vomiting or nausea. [provider] Taking Active Self, Pharmacy Records  Oxycodone HCl 20 MG TABS [id number] Yes Take 1 tablet by mouth daily as needed (For pain). [provider] Taking Active Self, Pharmacy Records  pantoprazole (PROTONIX) 40 MG tablet [id number] Yes Take 1 tablet (40 mg total) by mouth daily. Beverley Fiedler, MD Taking Active Self, Pharmacy Records  sodium bicarbonate 650 MG tablet [id number] Yes Take 650 mg by mouth daily. [provider] Taking Active Self, Pharmacy Records  Vitamin D, Ergocalciferol, (DRISDOL) 1.25 MG (50000 UNIT) CAPS capsule [id number] Yes TAKE 1 CAPSULE BY MOUTH EVERY 7 DAYS Ivonne Andrew, NP Taking Active Self, Pharmacy Records           Med Note Tobias Alexander Sep 04, 2023  8:56 PM) Pt dose not have a set day he dose this on, recently ran out and in need of a refill            Home Care and Equipment/Supplies: Were Home Health Services Ordered?: No Any new equipment or medical supplies ordered?: No  Functional Questionnaire: Do you need assistance with bathing/showering or dressing?: No Do you need assistance with meal preparation?: No Do you need assistance with eating?: No Do you have difficulty maintaining continence: No Do you need assistance with getting out of bed/getting out of a chair/moving?: No Do you have difficulty managing or taking your medications?: No  Follow up appointments reviewed: PCP Follow-up appointment confirmed?: Yes Follow-up Provider: Angus Seller - Patient thought he had one for next week but said he would make sure to schedule it, if not. Declined TOC appt. assist. Specialist Hospital Follow-up appointment confirmed?: NA Do you need transportation to your follow-up appointment?: No Do you understand care options if your condition(s) worsen?: Yes-patient verbalized understanding  SDOH Interventions Today    Flowsheet Row Most Recent Value  SDOH Interventions   Food Insecurity Interventions Intervention Not Indicated  Housing Interventions Intervention Not Indicated  Transportation Interventions Intervention Not Indicated  Utilities Interventions Intervention Not Indicated     TOC Interventions discussed/reviewed:   Reviewed upcoming doctor appointments and importance of keeping scheduled appointments.  Patient will follow-up with PCP to ensure he has a follow-up appointment within a week of his discharge.  Decline TOC  appointment making assistance.   Patient verbal education was provided and verbalized understanding of  30 Day Program, Hospital discharge summary instructions including post-discharge diet and activity orders:  Activity: As tolerated increasing slowly;  home medications, their functions and potential side-effects, condition-specific self-management and  when to notify provider for questions, concerns, or changes in condition.  No acute needs identified at this time. No further follow-up requested.  Raia Amico Daphine Deutscher BSN, RN RN Care Manager   Transitions of Care [facility] Direct Dial Number:  [phone]

## 2023-09-28 ENCOUNTER — Other Ambulatory Visit (INDEPENDENT_AMBULATORY_CARE_PROVIDER_SITE_OTHER): Payer: Medicare HMO | Admitting: Pharmacist

## 2023-09-28 DIAGNOSIS — D860 Sarcoidosis of lung: Secondary | ICD-10-CM

## 2023-09-28 DIAGNOSIS — J45909 Unspecified asthma, uncomplicated: Secondary | ICD-10-CM

## 2023-09-28 NOTE — Progress Notes (Signed)
 09/28/2023 Name: Shawn Adams MRN: [id number] DOB: 1976/06/18  Chief Complaint  Patient presents with   Medication Management    Shawn Adams is a 48 y.o. year old male who presented for a telephone visit.   They were referred to the pharmacist by their Case Management Team  for assistance in managing  asthma/sarcoidosis .    Subjective:  Care Team: Primary Care Provider: Oley Bascom RAMAN, NP ; Next Scheduled Visit: 11/30/23  Medication Access/Adherence  Current Pharmacy:  [id number] [place], [place] - [address] AT [facility] OF [facility] & [address] [place] [address] Phone: [phone] Fax: [phone]  [facility] [place], [place] [address] [place] [address] Phone: [phone] Fax: [phone]   Patient reports affordability concerns with their medications: No  Patient reports access/transportation concerns to their pharmacy: No  Patient reports adherence concerns with their medications:  No    Reported to RN CM that he had been taking albuterol  daily and Symbicort as needed   Asthma/sarcoidosis  Current medications: Symbicort 160/4.5 mcg - taking PRN; albuterol  HFA - taking daily  Reports 0 exacerbations in the past year  Current medication access support: no concerns, Medicare + Medicaid  Follows up with Pulmonology in March   Objective:  No results found for: HGBA1C  Lab Results  Component Value Date   CREATININE 2.53 (H) 09/08/2023   BUN 39 (H) 09/08/2023   NA 134 (L) 09/08/2023   K 4.2 09/08/2023   CL 107 09/08/2023   CO2 19 (L) 09/08/2023    Lab Results  Component Value Date   TRIG 182 (H) 07/11/2020    Medications Reviewed Today     Reviewed by Rudy Dorothyann DASEN, RPH-CPP (Pharmacist) on 09/28/23 at 1305  Med List Status: <None>   Medication Order Taking? Sig Documenting Provider Last Dose Status Informant  acetaminophen  (TYLENOL ) 500 MG  tablet [id number] Yes Take 500 mg by mouth every 6 (six) hours as needed for moderate pain or fever (pain).  [provider] Taking Active Self, Pharmacy Records           Med Note (S[place]TER[place], TEENA BRAVO   Wed Feb 22, 2023  6:01 PM)    albuterol  (VENTOLIN  HFA) 108 (90 Base) MCG/ACT inhaler [id number] Yes Inhale 1-2 puffs into the lungs every 6 (six) hours as needed for wheezing or shortness of breath. Oley Bascom RAMAN, NP Taking Active Self, Pharmacy Records  Ascorbic Acid  (VITAMIN C  PO) [id number] Yes Take 1 tablet by mouth daily. [provider] Taking Active Self, Pharmacy Records  budesonide-formoterol  [facility]) 160-4.5 MCG/ACT inhaler [id number] Yes Inhale 2 puffs into the lungs 2 (two) times daily. [provider] Taking Active Self, Pharmacy Records           Med Note [place], DOROTHYANN DASEN Schaumann Sep 28, 2023  1:03 PM)    cyanocobalamin  50 MCG tablet [id number] Yes Take 100 mcg by mouth daily. [provider] Taking Active Self, Pharmacy Records  docusate sodium  (COLACE) 100 MG capsule [id number] Yes Take 100 mg by mouth daily as needed for mild constipation. [provider] Taking Active Self, Pharmacy Records           Med Note IDAMAE ALFREIDA LITTIE Pablo Sep 04, 2023  4:56 PM)    folic acid  (FOLVITE ) 400 MCG tablet [id number] Yes Take 400 mcg by mouth daily. [provider]  Active   hydroxychloroquine  (PLAQUENIL ) 200 MG tablet [id number] No Take 200 mg by mouth daily.  Patient not taking: Reported on 09/12/2023   [provider] Not Taking Active Self, Pharmacy Records  hydroxyurea  (HYDREA ) 500 MG capsule [id number] Yes Take 2 capsules (1,000 mg total) by mouth daily. May take with food to minimize GI side effects. [name], [name], MD Taking Active Self, Pharmacy Records           Med Note PECOLA, DELAWARE D   Thu Sep 13, 2018  5:52 AM)    losartan  (COZAAR ) 25 MG tablet [id number] Yes Take 25 mg by mouth daily. [provider] Taking Active Self, Pharmacy Records  Melatonin 10 MG TABS [id number] Yes Take 10 mg by mouth at bedtime. [provider] Taking Active Self, Pharmacy Records           Med Note (SATTERFIELD, TEENA BRAVO   Wed Feb 22, 2023  6:02 PM)    ondansetron  (ZOFRAN ) 4 MG tablet [id number] Yes Take 4 mg by mouth every 8 (eight) hours as needed for vomiting or nausea. [provider] Taking Active Self, Pharmacy Records  Oxycodone  HCl 20 MG TABS [id number] Yes Take 1 tablet by mouth daily as needed (For pain). [provider] Taking Active Self, Pharmacy Records  pantoprazole  (PROTONIX ) 40 MG tablet [id number] Yes Take 1 tablet (40 mg total) by mouth daily. Albertus [name], MD Taking Active Self, Pharmacy Records  sodium bicarbonate  650 MG tablet [id number] Yes Take 650 mg by mouth daily. [provider] Taking Active Self, Pharmacy Records  tadalafil (CIALIS) 10 MG tablet [id number] Yes Take 10 mg by mouth daily as needed for erectile dysfunction. [provider] Taking Active   Vitamin D , Ergocalciferol , (DRISDOL ) 1.25 MG (50000 UNIT) CAPS capsule [id number] No TAKE 1 CAPSULE BY MOUTH EVERY 7 DAYS  Patient not taking: Reported on 09/28/2023   Oley Bascom RAMAN, NP Not Taking Active Self, Pharmacy Records           Med Note [place], Irisa Grimsley T   Thu Sep 28, 2023  1:04 PM)                Assessment/Plan:   Asthma: - Currently controlled but with opportunity for improvement. - Reviewed appropriate inhaler technique. Discussed maintenance vs rescue inhalers. Recommend to take Symbicort 2 puffs twice daily as prescribed, reserve albuterol  for as needed use. Patient verbalizes understanding.   Follow Up Plan: goals of referral met.   Catie IVAR Centers, PharmD, BCACP, CPP Clinical Pharmacist [facility] [phone]

## 2023-09-28 NOTE — Patient Instructions (Addendum)
 [place],   It was great talking with you today!  Take the Symbicort as your maintenance inhaler - 2 puffs twice daily, every day.   Use the albuterol  as needed for any breakthrough symptoms, like wheezing or shortness of breath.   Please reach out with any future questions or concerns!  Catie IVAR Centers, PharmD, BCACP, CPP Clinical Pharmacist [facility] [phone]

## 2023-11-09 DIAGNOSIS — N1832 Chronic kidney disease, stage 3b: Secondary | ICD-10-CM | POA: Diagnosis not present

## 2023-11-09 DIAGNOSIS — D869 Sarcoidosis, unspecified: Secondary | ICD-10-CM | POA: Diagnosis not present

## 2023-11-09 DIAGNOSIS — I272 Pulmonary hypertension, unspecified: Secondary | ICD-10-CM | POA: Diagnosis not present

## 2023-11-09 DIAGNOSIS — I671 Cerebral aneurysm, nonruptured: Secondary | ICD-10-CM | POA: Diagnosis not present

## 2023-11-09 DIAGNOSIS — T8089XA Other complications following infusion, transfusion and therapeutic injection, initial encounter: Secondary | ICD-10-CM | POA: Diagnosis not present

## 2023-11-09 DIAGNOSIS — G8929 Other chronic pain: Secondary | ICD-10-CM | POA: Diagnosis not present

## 2023-11-09 DIAGNOSIS — E559 Vitamin D deficiency, unspecified: Secondary | ICD-10-CM | POA: Diagnosis not present

## 2023-11-09 DIAGNOSIS — D571 Sickle-cell disease without crisis: Secondary | ICD-10-CM | POA: Diagnosis not present

## 2023-11-10 DIAGNOSIS — D500 Iron deficiency anemia secondary to blood loss (chronic): Secondary | ICD-10-CM | POA: Diagnosis not present

## 2023-11-10 DIAGNOSIS — D571 Sickle-cell disease without crisis: Secondary | ICD-10-CM | POA: Diagnosis not present

## 2023-11-10 DIAGNOSIS — N1832 Chronic kidney disease, stage 3b: Secondary | ICD-10-CM | POA: Diagnosis not present

## 2023-11-10 DIAGNOSIS — D631 Anemia in chronic kidney disease: Secondary | ICD-10-CM | POA: Diagnosis not present

## 2023-11-29 DIAGNOSIS — N1832 Chronic kidney disease, stage 3b: Secondary | ICD-10-CM | POA: Diagnosis not present

## 2023-11-30 ENCOUNTER — Ambulatory Visit: Payer: Self-pay | Admitting: Nurse Practitioner

## 2023-12-03 DIAGNOSIS — F4321 Adjustment disorder with depressed mood: Secondary | ICD-10-CM | POA: Diagnosis not present

## 2023-12-06 ENCOUNTER — Telehealth: Payer: Self-pay

## 2023-12-06 NOTE — Telephone Encounter (Unsigned)
 Copied from CRM [id number]. Topic: Clinical - Request for Lab/Test Order >> Dec 06, 2023 12:43 PM Marlow Baars wrote: Reason for CRM: Lovie Macadamia with [facility] called in stating she is in need of additional diagnosis information on date on service 08/09/23. Please assist further by calling [facility] at [phone] and use Reference #[id number]. Please assist further  They want more dx code for the service date 08/09/23.

## 2023-12-16 DIAGNOSIS — J4489 Other specified chronic obstructive pulmonary disease: Secondary | ICD-10-CM | POA: Diagnosis not present

## 2023-12-16 DIAGNOSIS — I129 Hypertensive chronic kidney disease with stage 1 through stage 4 chronic kidney disease, or unspecified chronic kidney disease: Secondary | ICD-10-CM | POA: Diagnosis not present

## 2023-12-16 DIAGNOSIS — Z7951 Long term (current) use of inhaled steroids: Secondary | ICD-10-CM | POA: Diagnosis not present

## 2023-12-16 DIAGNOSIS — D638 Anemia in other chronic diseases classified elsewhere: Secondary | ICD-10-CM | POA: Diagnosis not present

## 2023-12-16 DIAGNOSIS — Z79899 Other long term (current) drug therapy: Secondary | ICD-10-CM | POA: Diagnosis not present

## 2023-12-16 DIAGNOSIS — N183 Chronic kidney disease, stage 3 unspecified: Secondary | ICD-10-CM | POA: Diagnosis not present

## 2023-12-16 DIAGNOSIS — K219 Gastro-esophageal reflux disease without esophagitis: Secondary | ICD-10-CM | POA: Diagnosis not present

## 2023-12-16 DIAGNOSIS — D5700 Hb-SS disease with crisis, unspecified: Secondary | ICD-10-CM | POA: Diagnosis not present

## 2023-12-16 DIAGNOSIS — D62 Acute posthemorrhagic anemia: Secondary | ICD-10-CM | POA: Diagnosis not present

## 2023-12-16 DIAGNOSIS — N189 Chronic kidney disease, unspecified: Secondary | ICD-10-CM | POA: Diagnosis not present

## 2023-12-17 DIAGNOSIS — D5709 Hb-ss disease with crisis with other specified complication: Secondary | ICD-10-CM | POA: Diagnosis not present

## 2023-12-18 DIAGNOSIS — D5709 Hb-ss disease with crisis with other specified complication: Secondary | ICD-10-CM | POA: Diagnosis not present

## 2023-12-19 ENCOUNTER — Telehealth: Payer: Self-pay | Admitting: *Deleted

## 2023-12-19 NOTE — Transitions of Care (Post Inpatient/ED Visit) (Signed)
   12/19/2023  Name: Iven Earnhart MRN: [id number] DOB: 1976/06/19  Today's TOC FU Call Status: Today's TOC FU Call Status:: Unsuccessful Call (1st Attempt) Unsuccessful Call (1st Attempt) Date: 12/19/23  Attempted to reach the patient regarding the most recent Inpatient/ED visit.  Follow Up Plan: Additional outreach attempts will be made to reach the patient to complete the Transitions of Care (Post Inpatient/ED visit) call.   Irving Shows [facility], BSN RN Care Manager/ Transition of Care [place]/ [facility] [phone]

## 2023-12-20 ENCOUNTER — Telehealth: Payer: Self-pay | Admitting: *Deleted

## 2023-12-20 NOTE — Transitions of Care (Post Inpatient/ED Visit) (Signed)
   12/20/2023  Name: Shawn Adams MRN: [id number] DOB: [birth year]  Today's TOC FU Call Status: Today's TOC FU Call Status:: Unsuccessful Call (2nd Attempt) Unsuccessful Call (2nd Attempt) Date: 12/20/23  Attempted to reach the patient regarding the most recent Inpatient/ED visit.  Follow Up Plan: Additional outreach attempts will be made to reach the patient to complete the Transitions of Care (Post Inpatient/ED visit) call.   Irving Shows [facility], BSN RN Care Manager/ Transition of Care [place]/ [facility] [phone]

## 2023-12-21 ENCOUNTER — Telehealth: Payer: Self-pay | Admitting: *Deleted

## 2023-12-21 NOTE — Transitions of Care (Post Inpatient/ED Visit) (Signed)
   12/21/2023  Name: Youssef Footman MRN: [id number] DOB: September 20, 1976  Today's TOC FU Call Status: Today's TOC FU Call Status:: Unsuccessful Call (3rd Attempt) Unsuccessful Call (3rd Attempt) Date: 12/21/23  Attempted to reach the patient regarding the most recent Inpatient/ED visit.  Follow Up Plan: No further outreach attempts will be made at this time. We have been unable to contact the patient.  Irving Shows [facility], BSN RN Care Manager/ Transition of Care [place]/ [facility] [phone]

## 2023-12-23 ENCOUNTER — Other Ambulatory Visit: Payer: Self-pay | Admitting: Internal Medicine

## 2023-12-25 ENCOUNTER — Other Ambulatory Visit: Payer: Self-pay

## 2023-12-25 MED ORDER — PANTOPRAZOLE SODIUM 40 MG PO TBEC: DELAYED_RELEASE_TABLET | ORAL | 0 refills | Status: AC

## 2024-01-01 ENCOUNTER — Encounter: Payer: Self-pay | Admitting: Nurse Practitioner

## 2024-01-01 ENCOUNTER — Ambulatory Visit (INDEPENDENT_AMBULATORY_CARE_PROVIDER_SITE_OTHER): Payer: Self-pay | Admitting: Nurse Practitioner

## 2024-01-01 VITALS — BP 130/82 | HR 85 | Temp 98.1°F | Wt 137.4 lb

## 2024-01-01 DIAGNOSIS — D571 Sickle-cell disease without crisis: Secondary | ICD-10-CM

## 2024-01-01 NOTE — Progress Notes (Signed)
 Subjective   Patient ID: Shawn Adams, male    DOB: [birth year], 48 y.o.   MRN: [id number]  Chief Complaint  Patient presents with   Medical Management of Chronic Issues    Referring provider: Ivonne Andrew, NP  Shawn Adams is a 48 y.o. male with Past Medical History: No date: Aneurysm (HCC)     Comment:  Pt states happened in 2011, Brain  No date: Asthma No date: CKD (chronic kidney disease) stage 3, GFR 30-59 ml/min (HCC) No date: Heart murmur No date: Pulmonary sarcoidosis (HCC) No date: Sarcoid No date: Sickle cell anemia (HCC)   HPI   Patient presents today for a follow-up visit.  He is seen by pulmonary through [facility] for sarcoidosis and he is also seen through hematology through [facility] for sickle cell disease.  Overall patient has been doing well with no new issues or concerns.  Denies f/c/s, n/v/d, hemoptysis, PND, leg swelling. Denies chest pain or edema.     No Known Allergies  Immunization History  Administered Date(s) Administered   Dtap, Unspecified 09/08/1976, 12/29/1976, 02/23/1977, 03/08/1978, 07/16/1980, 10/02/2013   Fluzone Influenza virus vaccine,trivalent (IIV3), split virus 07/19/2012, 09/06/2013, 05/11/2016   HIB (PRP-T) 09/11/2013   Hep A / Hep B 05/27/2009   Influenza, Seasonal, Injecte, Preservative Fre 06/02/2023   Influenza,inj,Quad PF,6+ Mos 06/21/2014, 07/08/2015, 05/11/2016, 07/17/2017, 06/21/2018, 09/30/2019, 07/22/2021, 09/05/2022   Influenza,inj,quad, With Preservative 05/11/2016   Influenza-Unspecified 07/10/2018   MMR 11/09/1977, 10/04/2013   Meningococcal B, OMV 11/26/2018, 01/24/2020   Meningococcal Conjugate 09/11/2013, 11/26/2018, 11/26/2018   PFIZER(Purple Top)SARS-COV-2 Vaccination 12/06/2019, 12/15/2019, 12/30/2019   Pfizer Covid-19 Vaccine Bivalent Booster 35yrs & up 07/30/2021   Pneumococcal Conjugate-13 11/19/2015   Pneumococcal Polysaccharide-23 10/02/2009, 10/23/2017   Polio, Unspecified 09/08/1976,  12/29/1976, 03/08/1978, 07/16/1980   Tdap 07/08/2015   Unspecified SARS-COV-2 Vaccination 12/15/2019, 12/30/2019    Tobacco History: Social History   Tobacco Use  Smoking Status Former   Types: Cigarettes  Smokeless Tobacco Never   Counseling given: Not Answered   Outpatient Encounter Medications as of 01/01/2024  Medication Sig   acetaminophen (TYLENOL) 500 MG tablet Take 500 mg by mouth every 6 (six) hours as needed for moderate pain or fever (pain).    albuterol (VENTOLIN HFA) 108 (90 Base) MCG/ACT inhaler Inhale 1-2 puffs into the lungs every 6 (six) hours as needed for wheezing or shortness of breath.   Ascorbic Acid (VITAMIN C PO) Take 1 tablet by mouth daily.   budesonide-formoterol (SYMBICORT) 160-4.5 MCG/ACT inhaler Inhale 2 puffs into the lungs 2 (two) times daily.   docusate sodium (COLACE) 100 MG capsule Take 100 mg by mouth daily as needed for mild constipation.   folic acid (FOLVITE) 400 MCG tablet Take 400 mcg by mouth daily.   hydroxyurea (HYDREA) 500 MG capsule Take 2 capsules (1,000 mg total) by mouth daily. May take with food to minimize GI side effects.   losartan (COZAAR) 25 MG tablet Take 25 mg by mouth daily.   Melatonin 10 MG TABS Take 10 mg by mouth at bedtime.   ondansetron (ZOFRAN) 4 MG tablet Take 4 mg by mouth every 8 (eight) hours as needed for vomiting or nausea.   Oxycodone HCl 20 MG TABS Take 1 tablet by mouth daily as needed (For pain).   pantoprazole (PROTONIX) 40 MG tablet TAKE 1 TABLET(40 MG) BY MOUTH DAILY-NEEDS APPT FOR FURTHER REFILLS   sodium bicarbonate 650 MG tablet Take 650 mg by mouth daily.   tadalafil (CIALIS) 10 MG  tablet Take 10 mg by mouth daily as needed for erectile dysfunction.   cyanocobalamin 50 MCG tablet Take 100 mcg by mouth daily. (Patient not taking: Reported on 01/01/2024)   hydroxychloroquine (PLAQUENIL) 200 MG tablet Take 200 mg by mouth daily. (Patient not taking: Reported on 09/12/2023)   Vitamin D, Ergocalciferol,  (DRISDOL) 1.25 MG (50000 UNIT) CAPS capsule TAKE 1 CAPSULE BY MOUTH EVERY 7 DAYS (Patient not taking: Reported on 09/28/2023)   No facility-administered encounter medications on file as of 01/01/2024.    Review of Systems  Review of Systems  Constitutional: Negative.   HENT: Negative.    Cardiovascular: Negative.   Gastrointestinal: Negative.   Allergic/Immunologic: Negative.   Neurological: Negative.   Psychiatric/Behavioral: Negative.       Objective:   BP 130/82   Pulse 85   Temp 98.1 F (36.7 C) (Oral)   Wt 137 lb 6.4 oz (62.3 kg)   SpO2 97%   BMI 18.63 kg/m   Wt Readings from Last 5 Encounters:  01/01/24 137 lb 6.4 oz (62.3 kg)  09/04/23 140 lb (63.5 kg)  09/01/23 139 lb 12.8 oz (63.4 kg)  08/09/23 137 lb (62.1 kg)  06/02/23 125 lb (56.7 kg)     Physical Exam Vitals and nursing note reviewed.  Constitutional:      General: He is not in acute distress.    Appearance: He is well-developed.  Cardiovascular:     Rate and Rhythm: Normal rate and regular rhythm.  Pulmonary:     Effort: Pulmonary effort is normal.     Breath sounds: Normal breath sounds.  Skin:    General: Skin is warm and dry.  Neurological:     Mental Status: He is alert and oriented to person, place, and time.       Assessment & Plan:   Sickle cell disease without crisis (HCC) -     Sickle Cell Panel -     ToxAssure Flex 15, Ur     Return in about 3 months (around 04/01/2024).    Ivonne Andrew, NP 01/01/2024

## 2024-01-01 NOTE — Patient Instructions (Signed)
 1. Sickle cell disease without crisis (HCC) (Primary)  - Sickle Cell Panel - ToxAssure Flex 15, Ur

## 2024-01-02 ENCOUNTER — Telehealth: Payer: Self-pay

## 2024-01-02 LAB — CMP14+CBC/D/PLT+FER+RETIC+V...
ALT: 20 IU/L (ref 0–44)
AST: 35 IU/L (ref 0–40)
Albumin: 3.6 g/dL — ABNORMAL LOW (ref 4.1–5.1)
Alkaline Phosphatase: 121 IU/L (ref 44–121)
BUN/Creatinine Ratio: 11 (ref 9–20)
BUN: 26 mg/dL — ABNORMAL HIGH (ref 6–24)
Basophils Absolute: 0 10*3/uL (ref 0.0–0.2)
Basos: 1 %
Bilirubin Total: 1.2 mg/dL (ref 0.0–1.2)
CO2: 15 mmol/L — ABNORMAL LOW (ref 20–29)
Calcium: 10.1 mg/dL (ref 8.7–10.2)
Chloride: 111 mmol/L — ABNORMAL HIGH (ref 96–106)
Creatinine, Ser: 2.42 mg/dL — ABNORMAL HIGH (ref 0.76–1.27)
EOS (ABSOLUTE): 0.4 10*3/uL (ref 0.0–0.4)
Eos: 6 %
Ferritin: 3548 ng/mL — ABNORMAL HIGH (ref 30–400)
Globulin, Total: 3.3 g/dL (ref 1.5–4.5)
Glucose: 123 mg/dL — ABNORMAL HIGH (ref 70–99)
Hematocrit: 16.1 % — CL (ref 37.5–51.0)
Hemoglobin: 5.6 g/dL — CL (ref 13.0–17.7)
Immature Grans (Abs): 0.1 10*3/uL (ref 0.0–0.1)
Immature Granulocytes: 1 %
Lymphocytes Absolute: 1.7 10*3/uL (ref 0.7–3.1)
Lymphs: 26 %
MCH: 32.2 pg (ref 26.6–33.0)
MCHC: 34.8 g/dL (ref 31.5–35.7)
MCV: 93 fL (ref 79–97)
Monocytes Absolute: 0.6 10*3/uL (ref 0.1–0.9)
Monocytes: 9 %
NRBC: 8 % — ABNORMAL HIGH (ref 0–0)
Neutrophils Absolute: 3.8 10*3/uL (ref 1.4–7.0)
Neutrophils: 57 %
Platelets: 543 10*3/uL — ABNORMAL HIGH (ref 150–450)
Potassium: 3.9 mmol/L (ref 3.5–5.2)
RBC: 1.74 x10E6/uL — CL (ref 4.14–5.80)
RDW: 24.7 % — ABNORMAL HIGH (ref 11.6–15.4)
Retic Ct Pct: 16 % — ABNORMAL HIGH (ref 0.6–2.6)
Sodium: 138 mmol/L (ref 134–144)
Total Protein: 6.9 g/dL (ref 6.0–8.5)
Vit D, 25-Hydroxy: 17.4 ng/mL — ABNORMAL LOW (ref 30.0–100.0)
WBC: 6.6 10*3/uL (ref 3.4–10.8)
eGFR: 32 mL/min/{1.73_m2} — ABNORMAL LOW (ref >59)

## 2024-01-02 NOTE — Telephone Encounter (Signed)
 Pt had critical labs Hemoglobin 5.6 and hematocrit  16.1. KH

## 2024-01-04 DIAGNOSIS — N1832 Chronic kidney disease, stage 3b: Secondary | ICD-10-CM | POA: Diagnosis not present

## 2024-01-04 DIAGNOSIS — D571 Sickle-cell disease without crisis: Secondary | ICD-10-CM | POA: Diagnosis not present

## 2024-01-04 DIAGNOSIS — T8089XA Other complications following infusion, transfusion and therapeutic injection, initial encounter: Secondary | ICD-10-CM | POA: Diagnosis not present

## 2024-01-05 LAB — TOXASSURE FLEX 15, UR
6-ACETYLMORPHINE IA: NEGATIVE ng/mL
AMPHETAMINES IA: NEGATIVE ng/mL
BARBITURATES IA: NEGATIVE ng/mL
CANNABINOIDS IA: NEGATIVE ng/mL
COCAINE METABOLITE IA: NEGATIVE ng/mL
Creatinine: 79 mg/dL
ETHYL ALCOHOL Enzymatic: NEGATIVE g/dL
METHADONE IA: NEGATIVE ng/mL
METHADONE MTB IA: NEGATIVE ng/mL
OPIATE CLASS IA: NEGATIVE ng/mL
OXYCODONE CLASS IA: NEGATIVE ng/mL
PHENCYCLIDINE IA: NEGATIVE ng/mL
TAPENTADOL, IA: NEGATIVE ng/mL
TRAMADOL IA: NEGATIVE ng/mL

## 2024-01-10 DIAGNOSIS — D500 Iron deficiency anemia secondary to blood loss (chronic): Secondary | ICD-10-CM | POA: Diagnosis not present

## 2024-01-10 DIAGNOSIS — D571 Sickle-cell disease without crisis: Secondary | ICD-10-CM | POA: Diagnosis not present

## 2024-01-25 DIAGNOSIS — D869 Sarcoidosis, unspecified: Secondary | ICD-10-CM | POA: Diagnosis not present

## 2024-01-25 DIAGNOSIS — J4550 Severe persistent asthma, uncomplicated: Secondary | ICD-10-CM | POA: Diagnosis not present

## 2024-02-08 DIAGNOSIS — E559 Vitamin D deficiency, unspecified: Secondary | ICD-10-CM | POA: Diagnosis not present

## 2024-02-08 DIAGNOSIS — D869 Sarcoidosis, unspecified: Secondary | ICD-10-CM | POA: Diagnosis not present

## 2024-02-08 DIAGNOSIS — D571 Sickle-cell disease without crisis: Secondary | ICD-10-CM | POA: Diagnosis not present

## 2024-02-08 DIAGNOSIS — G8929 Other chronic pain: Secondary | ICD-10-CM | POA: Diagnosis not present

## 2024-02-08 DIAGNOSIS — I671 Cerebral aneurysm, nonruptured: Secondary | ICD-10-CM | POA: Diagnosis not present

## 2024-02-08 DIAGNOSIS — T8089XA Other complications following infusion, transfusion and therapeutic injection, initial encounter: Secondary | ICD-10-CM | POA: Diagnosis not present

## 2024-02-08 DIAGNOSIS — N1832 Chronic kidney disease, stage 3b: Secondary | ICD-10-CM | POA: Diagnosis not present

## 2024-02-09 DIAGNOSIS — D571 Sickle-cell disease without crisis: Secondary | ICD-10-CM | POA: Diagnosis not present

## 2024-02-09 DIAGNOSIS — D508 Other iron deficiency anemias: Secondary | ICD-10-CM | POA: Diagnosis not present

## 2024-02-09 DIAGNOSIS — D500 Iron deficiency anemia secondary to blood loss (chronic): Secondary | ICD-10-CM | POA: Diagnosis not present

## 2024-02-20 ENCOUNTER — Emergency Department (HOSPITAL_COMMUNITY)
Admission: EM | Admit: 2024-02-20 | Discharge: 2024-02-20 | Disposition: A | Discharge status: Home / Self Care | Attending: Emergency Medicine | Admitting: Emergency Medicine

## 2024-02-20 ENCOUNTER — Emergency Department (HOSPITAL_COMMUNITY)

## 2024-02-20 ENCOUNTER — Encounter (HOSPITAL_COMMUNITY): Payer: Self-pay

## 2024-02-20 ENCOUNTER — Other Ambulatory Visit: Payer: Self-pay

## 2024-02-20 DIAGNOSIS — D860 Sarcoidosis of lung: Secondary | ICD-10-CM | POA: Insufficient documentation

## 2024-02-20 DIAGNOSIS — R052 Subacute cough: Secondary | ICD-10-CM

## 2024-02-20 DIAGNOSIS — J45909 Unspecified asthma, uncomplicated: Secondary | ICD-10-CM | POA: Insufficient documentation

## 2024-02-20 DIAGNOSIS — Z859 Personal history of malignant neoplasm, unspecified: Secondary | ICD-10-CM | POA: Diagnosis not present

## 2024-02-20 DIAGNOSIS — R059 Cough, unspecified: Secondary | ICD-10-CM | POA: Diagnosis present

## 2024-02-20 DIAGNOSIS — D869 Sarcoidosis, unspecified: Secondary | ICD-10-CM | POA: Diagnosis not present

## 2024-02-20 DIAGNOSIS — R509 Fever, unspecified: Secondary | ICD-10-CM | POA: Diagnosis present

## 2024-02-20 DIAGNOSIS — N189 Chronic kidney disease, unspecified: Secondary | ICD-10-CM | POA: Insufficient documentation

## 2024-02-20 LAB — RESP PANEL BY RT-PCR (RSV, FLU A&B, COVID)  RVPGX2
Influenza A by PCR: NEGATIVE
Influenza B by PCR: NEGATIVE
Resp Syncytial Virus by PCR: NEGATIVE
SARS Coronavirus 2 by RT PCR: NEGATIVE

## 2024-02-20 MED ORDER — PREDNISONE 10 MG PO TABS
ORAL_TABLET | ORAL | 0 refills | Status: AC
Start: 2024-02-20 — End: 2024-03-28

## 2024-02-20 NOTE — Discharge Instructions (Signed)
 You were seen for cough in the emergency department.  It is likely from your pulmonary sarcoidosis.  At home, please the steroids that we prescribed you.  It is important to taper these when your symptoms start to improve to avoid withdrawals.    Check your MyChart online for the results of any tests that had not resulted by the time you left the emergency department.   Follow-up with your primary doctor in 2-3 days regarding your visit.  Follow-up with your pulmonologist in 1 week.  Return immediately to the emergency department if you experience any of the following: Worsening difficulty breathing, or any other concerning symptoms.    Thank you for visiting our Emergency Department. It was a pleasure taking care of you today.

## 2024-02-20 NOTE — ED Triage Notes (Signed)
 Pt came in for a productive cough that's lasted a month. Pt has not been tested since the cough has started. Pt also c/o back pain.

## 2024-02-20 NOTE — ED Provider Notes (Signed)
 [place] EMERGENCY DEPARTMENT AT [facility] Provider Note   CSN: [id number] Arrival date & time: 02/20/24  1636     History {Add pertinent medical, surgical, social history, OB history to HPI:1} Chief Complaint  Patient presents with   Cough   Back Pain    Macrae Wiegman is a 48 y.o. male.  48 year old male with a history of sickle cell disease complicated by acute chest as well as pulmonary sarcoidosis, asthma, and CKD who presents to the emergency department with cough.  Patient reports that for the past month he has been having a cough.  Initially was having some fevers but none recently.  Says that last night his cough started becoming more productive and decided to come into the emergency department.  Also has had some mild rhinorrhea.  No hemoptysis.  No unilateral leg swelling.  No recent surgeries, travel, history of cancer, or history of DVT/PE.  Does not smoke or vape.       Home Medications Prior to Admission medications   Medication Sig Start Date End Date Taking? Authorizing Provider  acetaminophen  (TYLENOL ) 500 MG tablet Take 500 mg by mouth every 6 (six) hours as needed for moderate pain or fever (pain).     [provider]  albuterol  (VENTOLIN  HFA) 108 (90 Base) MCG/ACT inhaler Inhale 1-2 puffs into the lungs every 6 (six) hours as needed for wheezing or shortness of breath. 08/09/23   Jerrlyn Morel, NP  Ascorbic Acid  (VITAMIN C  PO) Take 1 tablet by mouth daily.    [provider]  budesonide-formoterol  (SYMBICORT) 160-4.5 MCG/ACT inhaler Inhale 2 puffs into the lungs 2 (two) times daily. 05/10/18   [provider]  cyanocobalamin  50 MCG tablet Take 100 mcg by mouth daily. Patient not taking: Reported on 01/01/2024    [provider]  docusate sodium  (COLACE) 100 MG capsule Take 100 mg by mouth daily as needed for mild constipation.    [provider]  folic acid  (FOLVITE ) 400 MCG tablet Take 400 mcg by mouth  daily.    [provider]  hydroxychloroquine  (PLAQUENIL ) 200 MG tablet Take 200 mg by mouth daily. Patient not taking: Reported on 09/12/2023 01/13/20   [provider]  hydroxyurea  (HYDREA ) 500 MG capsule Take 2 capsules (1,000 mg total) by mouth daily. May take with food to minimize GI side effects. 02/09/16   [name], [name], MD  losartan  (COZAAR ) 25 MG tablet Take 25 mg by mouth daily.    [provider]  Melatonin 10 MG TABS Take 10 mg by mouth at bedtime.    [provider]  ondansetron  (ZOFRAN ) 4 MG tablet Take 4 mg by mouth every 8 (eight) hours as needed for vomiting or nausea. 11/22/21   [provider]  Oxycodone  HCl 20 MG TABS Take 1 tablet by mouth daily as needed (For pain). 08/23/22   [provider]  pantoprazole  (PROTONIX ) 40 MG tablet TAKE 1 TABLET(40 MG) BY MOUTH DAILY-NEEDS APPT FOR FURTHER REFILLS 12/25/23   Pyrtle, Amber Bail, MD  sodium bicarbonate  650 MG tablet Take 650 mg by mouth daily. 10/14/19   [provider]  tadalafil (CIALIS) 10 MG tablet Take 10 mg by mouth daily as needed for erectile dysfunction.    [provider]  Vitamin D , Ergocalciferol , (DRISDOL ) 1.25 MG (50000 UNIT) CAPS capsule TAKE 1 CAPSULE BY MOUTH EVERY 7 DAYS Patient not taking: Reported on 09/28/2023 11/28/22   Jerrlyn Morel, NP  Allergies    Patient has no known allergies.    Review of Systems   Review of Systems  Physical Exam Updated Vital Signs BP (!) 113/97 (BP Location: Right Arm)   Pulse 81   Temp 98.3 F (36.8 C) (Oral)   Resp 18   SpO2 100%  Physical Exam Vitals and nursing note reviewed.  Constitutional:      General: He is not in acute distress.    Appearance: He is well-developed.  HENT:     Head: Normocephalic and atraumatic.     Right Ear: External ear normal.     Left Ear: External ear normal.     Nose: Nose normal.  Eyes:     Extraocular Movements: Extraocular movements intact.      Conjunctiva/sclera: Conjunctivae normal.     Pupils: Pupils are equal, round, and reactive to light.  Cardiovascular:     Rate and Rhythm: Normal rate and regular rhythm.     Heart sounds: Normal heart sounds.  Pulmonary:     Effort: Pulmonary effort is normal. No respiratory distress.     Breath sounds: Normal breath sounds.  Musculoskeletal:     Cervical back: Normal range of motion and neck supple.  Skin:    General: Skin is warm and dry.  Neurological:     Mental Status: He is alert. Mental status is at baseline.  Psychiatric:        Mood and Affect: Mood normal.        Behavior: Behavior normal.     ED Results / Procedures / Treatments   Labs (all labs ordered are listed, but only abnormal results are displayed) Labs Reviewed  RESP PANEL BY RT-PCR (RSV, FLU A&B, COVID)  RVPGX2    EKG None  Radiology No results found.  Procedures Procedures  {Document cardiac monitor, telemetry assessment procedure when appropriate:1}  Medications Ordered in ED Medications - No data to display  ED Course/ Medical Decision Making/ A&P   {   Click here for ABCD2, HEART and other calculatorsREFRESH Note before signing :1}                              Medical Decision Making Amount and/or Complexity of Data Reviewed Radiology: ordered.   ***  {Document critical care time when appropriate:1} {Document review of labs and clinical decision tools ie heart score, Chads2Vasc2 etc:1}  {Document your independent review of radiology images, and any outside records:1} {Document your discussion with family members, caretakers, and with consultants:1} {Document social determinants of health affecting pt's care:1} {Document your decision making why or why not admission, treatments were needed:1} Final Clinical Impression(s) / ED Diagnoses Final diagnoses:  None    Rx / DC Orders ED Discharge Orders     None

## 2024-03-01 ENCOUNTER — Emergency Department (HOSPITAL_COMMUNITY)
Admission: EM | Admit: 2024-03-01 | Discharge: 2024-03-01 | Disposition: A | Discharge status: Home / Self Care | Attending: Emergency Medicine | Admitting: Emergency Medicine

## 2024-03-01 ENCOUNTER — Emergency Department (HOSPITAL_COMMUNITY)

## 2024-03-01 ENCOUNTER — Encounter (HOSPITAL_COMMUNITY): Payer: Self-pay

## 2024-03-01 ENCOUNTER — Other Ambulatory Visit: Payer: Self-pay

## 2024-03-01 DIAGNOSIS — N183 Chronic kidney disease, stage 3 unspecified: Secondary | ICD-10-CM | POA: Insufficient documentation

## 2024-03-01 DIAGNOSIS — R9431 Abnormal electrocardiogram [ECG] [EKG]: Secondary | ICD-10-CM | POA: Diagnosis not present

## 2024-03-01 DIAGNOSIS — R0789 Other chest pain: Secondary | ICD-10-CM | POA: Diagnosis not present

## 2024-03-01 DIAGNOSIS — I517 Cardiomegaly: Secondary | ICD-10-CM | POA: Diagnosis not present

## 2024-03-01 DIAGNOSIS — J45909 Unspecified asthma, uncomplicated: Secondary | ICD-10-CM | POA: Insufficient documentation

## 2024-03-01 DIAGNOSIS — Z87891 Personal history of nicotine dependence: Secondary | ICD-10-CM | POA: Diagnosis not present

## 2024-03-01 DIAGNOSIS — D5700 Hb-SS disease with crisis, unspecified: Secondary | ICD-10-CM | POA: Insufficient documentation

## 2024-03-01 DIAGNOSIS — D571 Sickle-cell disease without crisis: Secondary | ICD-10-CM | POA: Diagnosis not present

## 2024-03-01 DIAGNOSIS — R079 Chest pain, unspecified: Secondary | ICD-10-CM | POA: Diagnosis not present

## 2024-03-01 DIAGNOSIS — R0600 Dyspnea, unspecified: Secondary | ICD-10-CM | POA: Diagnosis not present

## 2024-03-01 LAB — COMPREHENSIVE METABOLIC PANEL WITH GFR
ALT: 20 U/L (ref 0–44)
AST: 30 U/L (ref 15–41)
Albumin: 3.3 g/dL — ABNORMAL LOW (ref 3.5–5.0)
Alkaline Phosphatase: 90 U/L (ref 38–126)
Anion gap: 9 (ref 5–15)
BUN: 46 mg/dL — ABNORMAL HIGH (ref 6–20)
CO2: 19 mmol/L — ABNORMAL LOW (ref 22–32)
Calcium: 10 mg/dL (ref 8.9–10.3)
Chloride: 108 mmol/L (ref 98–111)
Creatinine, Ser: 2.24 mg/dL — ABNORMAL HIGH (ref 0.61–1.24)
GFR, Estimated: 35 mL/min — ABNORMAL LOW (ref >60)
Glucose, Bld: 103 mg/dL — ABNORMAL HIGH (ref 70–99)
Potassium: 3.8 mmol/L (ref 3.5–5.1)
Sodium: 136 mmol/L (ref 135–145)
Total Bilirubin: 1.7 mg/dL — ABNORMAL HIGH (ref 0.0–1.2)
Total Protein: 7 g/dL (ref 6.5–8.1)

## 2024-03-01 LAB — CBC WITH DIFFERENTIAL/PLATELET
Abs Immature Granulocytes: 0.17 10*3/uL — ABNORMAL HIGH (ref 0.00–0.07)
Basophils Absolute: 0 10*3/uL (ref 0.0–0.1)
Basophils Relative: 0 %
Eosinophils Absolute: 0 10*3/uL (ref 0.0–0.5)
Eosinophils Relative: 0 %
HCT: 16.8 % — ABNORMAL LOW (ref 39.0–52.0)
Hemoglobin: 6 g/dL — CL (ref 13.0–17.0)
Immature Granulocytes: 2 %
Lymphocytes Relative: 12 %
Lymphs Abs: 1 10*3/uL (ref 0.7–4.0)
MCH: 34.3 pg — ABNORMAL HIGH (ref 26.0–34.0)
MCHC: 35.7 g/dL (ref 30.0–36.0)
MCV: 96 fL (ref 80.0–100.0)
Monocytes Absolute: 0.5 10*3/uL (ref 0.1–1.0)
Monocytes Relative: 6 %
Neutro Abs: 6.6 10*3/uL (ref 1.7–7.7)
Neutrophils Relative %: 80 %
Platelets: 321 10*3/uL (ref 150–400)
RBC: 1.75 MIL/uL — ABNORMAL LOW (ref 4.22–5.81)
RDW: 23.7 % — ABNORMAL HIGH (ref 11.5–15.5)
WBC: 8.3 10*3/uL (ref 4.0–10.5)
nRBC: 6.1 % — ABNORMAL HIGH (ref 0.0–0.2)

## 2024-03-01 LAB — TROPONIN I (HIGH SENSITIVITY): Troponin I (High Sensitivity): 8 ng/L (ref <18)

## 2024-03-01 LAB — RETICULOCYTES
Immature Retic Fract: 21.6 % — ABNORMAL HIGH (ref 2.3–15.9)
RBC.: 1.83 MIL/uL — ABNORMAL LOW (ref 4.22–5.81)
Retic Count, Absolute: 313.8 10*3/uL — ABNORMAL HIGH (ref 19.0–186.0)
Retic Ct Pct: 17.1 % — ABNORMAL HIGH (ref 0.4–3.1)

## 2024-03-01 MED ORDER — HYDROMORPHONE HCL 1 MG/ML IJ SOLN
2.0000 mg | INTRAMUSCULAR | Status: AC
  Administered 2024-03-01: 2 mg via INTRAVENOUS
  Filled 2024-03-01: qty 2

## 2024-03-01 MED ORDER — TRAMADOL HCL 50 MG PO TABS
50.0000 mg | ORAL_TABLET | Freq: Once | ORAL | Status: AC
  Administered 2024-03-01: 50 mg via ORAL
  Filled 2024-03-01: qty 1

## 2024-03-01 MED ORDER — KETOROLAC TROMETHAMINE 15 MG/ML IJ SOLN: 15.0000 mg | INTRAMUSCULAR | Status: DC

## 2024-03-01 NOTE — ED Triage Notes (Signed)
 Pt. Arrives for [facility]. States that it's been going on for a few days, but tonight it has started to take his breath away. C/o bilateral leg and arm pain, and back pain. States that he is also having pain in his chest.

## 2024-03-01 NOTE — ED Notes (Signed)
 Pt ok to d/c, denies dizziness when standing

## 2024-03-01 NOTE — ED Notes (Signed)
 Pt states feeling dizzy when standing, will allow pt to rest 30 mins

## 2024-03-01 NOTE — Discharge Instructions (Signed)
 You were evaluated in the Emergency Department and after careful evaluation, we did not find any emergent condition requiring admission or further testing in the hospital.  Your exam/testing today was overall reassuring.  Follow-up with your hematologist.  Please return to the Emergency Department if you experience any worsening of your condition.  Thank you for allowing us  to be a part of your care.

## 2024-03-01 NOTE — ED Notes (Signed)
 Pt in radiology

## 2024-03-01 NOTE — ED Notes (Signed)
 Pt is maintaining ox sats, mid 90% on room air.  Ok to d/c

## 2024-03-01 NOTE — ED Provider Notes (Signed)
 [facility]GENCY DEPT [facility] Emergency Department Provider Note MRN:  [id number]  Arrival date & time: 03/01/24     Chief Complaint   Sickle Cell Pain Crisis   History of Present Illness   Shawn Adams is a 48 y.o. year-old male with a history of CKD, sickle cell anemia presenting to the ED with chief complaint of sickle cell pain crisis.  Patient endorsing a pain crisis over the past 3 days.  Pain in the back, legs.  Typical areas of pain in the setting of crisis.  Taking his oxycodone  at home but not helping.  Denies fever.  When asked, he does endorse intermittent chest discomfort, no shortness of breath, no abdominal pain.  Review of Systems  A thorough review of systems was obtained and all systems are negative except as noted in the HPI and PMH.   Patient's Health History    Past Medical History:  Diagnosis Date   Aneurysm (HCC)    Pt states happened in 2011, Brain    Asthma    CKD (chronic kidney disease) stage 3, GFR 30-59 ml/min (HCC)    Heart murmur    Pulmonary sarcoidosis (HCC)    Sarcoid    Sickle cell anemia (HCC)     Past Surgical History:  Procedure Laterality Date   LYMPH NODE BIOPSY     NASAL SINUS SURGERY      Family History  Problem Relation Age of Onset   Diabetes Mother    Hypertension Father    Lupus Sister    Colon cancer Neg Hx    Esophageal cancer Neg Hx    Stomach cancer Neg Hx    Rectal cancer Neg Hx     Social History   Socioeconomic History   Marital status: Single    Spouse name: Not on file   Number of children: Not on file   Years of education: Not on file   Highest education level: Not on file  Occupational History   Not on file  Tobacco Use   Smoking status: Former    Types: Cigarettes   Smokeless tobacco: Never  Vaping Use   Vaping status: Never Used  Substance and Sexual Activity   Alcohol use: Not Currently    Alcohol/week: 1.0 standard drink of alcohol    Types: 1 Cans of beer per week    Comment:  occasionally   Drug use: Not Currently    Types: Marijuana   Sexual activity: Yes    Birth control/protection: None  Other Topics Concern   Not on file  Social History Narrative   Not on file   Social Drivers of Health   Financial Resource Strain: Low Risk  (08/15/2023)   Overall Financial Resource Strain (CARDIA)    Difficulty of Paying Living Expenses: Not very hard  Food Insecurity: Low Risk  (12/16/2023)   Received from [facility]   Hunger Vital Sign    Worried About Running Out of Food in the Last Year: Never true    Ran Out of Food in the Last Year: Never true  Transportation Needs: No Transportation Needs (12/16/2023)   Received from [facility]    In the past 12 months, has lack of reliable transportation kept you from medical appointments, meetings, work or from getting things needed for daily living? : No  Physical Activity: Sufficiently Active (03/23/2023)   Exercise Vital Sign    Days of Exercise per Week: 7 days    Minutes of Exercise per  Session: 30 min  Stress: No Stress Concern Present (08/22/2023)   [employer] of Occupational Health - Occupational Stress Questionnaire    Feeling of Stress : Not at all  Social Connections: Socially Integrated (03/23/2023)   Social Connection and Isolation Panel [NHANES]    Frequency of Communication with Friends and Family: More than three times a week    Frequency of Social Gatherings with Friends and Family: More than three times a week    Attends Religious Services: More than 4 times per year    Active Member of [employer] or Organizations: Yes    Attends Banker Meetings: More than 4 times per year    Marital Status: Married  Catering manager Violence: Not At Risk (09/12/2023)   Humiliation, Afraid, Rape, and Kick questionnaire    Fear of Current or Ex-Partner: No    Emotionally Abused: No    Physically Abused: No    Sexually Abused: No     Physical Exam   Vitals:   03/01/24 0435  03/01/24 0445  BP: (!) 162/94 (!) 165/83  Pulse: 61   Resp: 17 14  Temp: (!) 97.5 F (36.4 C)   SpO2: 97%     CONSTITUTIONAL: Well-appearing, NAD NEURO/PSYCH:  Alert and oriented x 3, no focal deficits EYES:  eyes equal and reactive ENT/NECK:  no LAD, no JVD CARDIO: Regular rate, well-perfused, normal S1 and S2 PULM:  CTAB no wheezing or rhonchi GI/GU:  non-distended, non-tender MSK/SPINE:  No gross deformities, no edema SKIN:  no rash, atraumatic   *Additional and/or pertinent findings included in MDM below  Diagnostic and Interventional Summary    EKG Interpretation Date/Time:  Friday March 01 2024 04:41:28 EDT Ventricular Rate:  57 PR Interval:    QRS Duration:  96 QT Interval:  450 QTC Calculation: 439 R Axis:   51  Text Interpretation: Junctional rhythm Abnormal R-wave progression, early transition Left ventricular hypertrophy Inferior infarct, acute (LCx) ST elevation, consider anterior injury Lateral leads are also involved >>> Acute MI <<< Confirmed by Gwenetta Lennert [id number]) on 03/01/2024 6:48:00 AM       Labs Reviewed  CBC WITH DIFFERENTIAL/PLATELET - Abnormal; Notable for the following components:      Result Value   RBC 1.75 (*)    Hemoglobin 6.0 (*)    HCT 16.8 (*)    MCH 34.3 (*)    RDW 23.7 (*)    nRBC 6.1 (*)    Abs Immature Granulocytes 0.17 (*)    All other components within normal limits  RETICULOCYTES - Abnormal; Notable for the following components:   Retic Ct Pct 17.1 (*)    RBC. 1.83 (*)    Retic Count, Absolute 313.8 (*)    Immature Retic Fract 21.6 (*)    All other components within normal limits  COMPREHENSIVE METABOLIC PANEL WITH GFR - Abnormal; Notable for the following components:   CO2 19 (*)    Glucose, Bld 103 (*)    BUN 46 (*)    Creatinine, Ser 2.24 (*)    Albumin 3.3 (*)    Total Bilirubin 1.7 (*)    GFR, Estimated 35 (*)    All other components within normal limits  TROPONIN I (HIGH SENSITIVITY)    DG Chest 2 View  Final  Result      Medications  HYDROmorphone  (DILAUDID ) injection 2 mg (has no administration in time range)  HYDROmorphone  (DILAUDID ) injection 2 mg (2 mg Intravenous Given 03/01/24 0527)  HYDROmorphone  (DILAUDID ) injection 2  mg (2 mg Intravenous Given 03/01/24 0614)  traMADol (ULTRAM) tablet 50 mg (50 mg Oral Given 03/01/24 0526)     Procedures  /  Critical Care Procedures  ED Course and Medical Decision Making  Initial Impression and Ddx Sickle cell pain versus chronic pain.  Does endorse occasional chest pain but overall highly doubt PE or dissection or ACS.  EKG is demonstrating evidence of benign early repolarization similar to prior EKG on record, possibly a bit more pronounced but overall highly doubt acute cardiac event at this time.  Past medical/surgical history that increases complexity of ED encounter: Sickle cell anemia  Interpretation of Diagnostics I personally reviewed the Laboratory Testing and my interpretation is as follows: Anemia and kidney function at or near recent values  Troponin negative, chest x-ray unremarkable  Patient Reassessment and Ultimate Disposition/Management     Patient feeling better, no indication for further testing or admission, appropriate for discharge.  Patient management required discussion with the following services or consulting groups:  None  Complexity of Problems Addressed Acute illness or injury that poses threat of life of bodily function  Additional Data Reviewed and Analyzed Further history obtained from: None  Additional Factors Impacting ED Encounter Risk Use of parenteral controlled substances and Consideration of hospitalization  Merrick Abe. Harless Lien, MD [facility] Emergency Medicine [facility] [email or web address]  Final Clinical Impressions(s) / ED Diagnoses     ICD-10-CM   1. Sickle cell pain crisis [facility])  D57.00       ED Discharge Orders     None        Discharge Instructions Discussed with and  Provided to Patient:     Discharge Instructions      You were evaluated in the Emergency Department and after careful evaluation, we did not find any emergent condition requiring admission or further testing in the hospital.  Your exam/testing today was overall reassuring.  Follow-up with your hematologist.  Please return to the Emergency Department if you experience any worsening of your condition.  Thank you for allowing us  to be a part of your care.      Edson Graces, MD 03/01/24 [phone]

## 2024-03-15 DIAGNOSIS — D508 Other iron deficiency anemias: Secondary | ICD-10-CM | POA: Diagnosis not present

## 2024-03-15 DIAGNOSIS — D500 Iron deficiency anemia secondary to blood loss (chronic): Secondary | ICD-10-CM | POA: Diagnosis not present

## 2024-03-15 DIAGNOSIS — D571 Sickle-cell disease without crisis: Secondary | ICD-10-CM | POA: Diagnosis not present

## 2024-03-28 ENCOUNTER — Ambulatory Visit: Payer: Self-pay

## 2024-03-28 VITALS — Ht 72.0 in | Wt 137.0 lb

## 2024-03-28 DIAGNOSIS — Z0000 Encounter for general adult medical examination without abnormal findings: Secondary | ICD-10-CM | POA: Diagnosis not present

## 2024-03-28 NOTE — Progress Notes (Signed)
 Subjective:   Shawn Adams is a 48 y.o. male who presents for Medicare Annual/Subsequent preventive examination.  Visit Complete: Virtual I connected with  Shawn Adams on 03/28/24 by a audio enabled telemedicine application and verified that I am speaking with the correct person using two identifiers.  Patient Location: Home  Provider Location: Office/Clinic  I discussed the limitations of evaluation and management by telemedicine. The patient expressed understanding and agreed to proceed.  Vital Signs: Because this visit was a virtual/telehealth visit, some criteria may be missing or patient reported. Any vitals not documented were not able to be obtained and vitals that have been documented are patient reported.  Patient Medicare AWV questionnaire was completed by the patient on 03/28/24; I have confirmed that all information answered by patient is correct and no changes since this date.  Cardiac Risk Factors include: Other (see comment);male gender;hypertension     Objective:    Today's Vitals   03/28/24 0927  Weight: 137 lb (62.1 kg)  Height: 6' (1.829 m)   Body mass index is 18.58 kg/m.     03/28/2024    9:41 AM 03/01/2024    4:39 AM 02/20/2024    4:48 PM 09/04/2023    2:10 PM 03/23/2023   11:06 AM 02/22/2023    2:27 PM 10/16/2021    1:00 AM  Advanced Directives  Does Patient Have a Medical Advance Directive? No No No No No No No  Would patient like information on creating a medical advance directive? No - Patient declined No - Patient declined   No - Patient declined No - Patient declined No - Patient declined    Current Medications (verified) Outpatient Encounter Medications as of 03/28/2024  Medication Sig   acetaminophen  (TYLENOL ) 500 MG tablet Take 500 mg by mouth every 6 (six) hours as needed for moderate pain or fever (pain).    albuterol  (VENTOLIN  HFA) 108 (90 Base) MCG/ACT inhaler Inhale 1-2 puffs into the lungs every 6 (six) hours as needed for wheezing or  shortness of breath.   Ascorbic Acid  (VITAMIN C  PO) Take 1 tablet by mouth daily.   budesonide-formoterol  (SYMBICORT) 160-4.5 MCG/ACT inhaler Inhale 2 puffs into the lungs 2 (two) times daily.   cyanocobalamin  50 MCG tablet Take 100 mcg by mouth daily.   docusate sodium  (COLACE) 100 MG capsule Take 100 mg by mouth daily as needed for mild constipation.   folic acid  (FOLVITE ) 400 MCG tablet Take 400 mcg by mouth daily.   hydroxyurea  (HYDREA ) 500 MG capsule Take 2 capsules (1,000 mg total) by mouth daily. May take with food to minimize GI side effects.   losartan  (COZAAR ) 25 MG tablet Take 25 mg by mouth daily.   Melatonin 10 MG TABS Take 10 mg by mouth at bedtime.   ondansetron  (ZOFRAN ) 4 MG tablet Take 4 mg by mouth every 8 (eight) hours as needed for vomiting or nausea.   Oxycodone  HCl 20 MG TABS Take 1 tablet by mouth daily as needed (For pain).   pantoprazole  (PROTONIX ) 40 MG tablet TAKE 1 TABLET(40 MG) BY MOUTH DAILY-NEEDS APPT FOR FURTHER REFILLS   predniSONE  (DELTASONE ) 10 MG tablet Take 2 tablets (20 mg total) by mouth daily for 21 days, THEN 1 tablet (10 mg total) daily for 7 days, THEN 0.5 tablets (5 mg total) daily for 7 days.   sodium bicarbonate  650 MG tablet Take 650 mg by mouth daily.   tadalafil (CIALIS) 10 MG tablet Take 10 mg by mouth daily as needed for  erectile dysfunction.   Vitamin D , Ergocalciferol , (DRISDOL ) 1.25 MG (50000 UNIT) CAPS capsule TAKE 1 CAPSULE BY MOUTH EVERY 7 DAYS   hydroxychloroquine  (PLAQUENIL ) 200 MG tablet Take 200 mg by mouth daily. (Patient not taking: Reported on 03/28/2024)   No facility-administered encounter medications on file as of 03/28/2024.    Allergies (verified) Patient has no known allergies.   History: Past Medical History:  Diagnosis Date   Aneurysm (HCC)    Pt states happened in 2011, Brain    Asthma    CKD (chronic kidney disease) stage 3, GFR 30-59 ml/min (HCC)    Heart murmur    Pulmonary sarcoidosis (HCC)    Sarcoid    Sickle  cell anemia (HCC)    Past Surgical History:  Procedure Laterality Date   LYMPH NODE BIOPSY     NASAL SINUS SURGERY     Family History  Problem Relation Age of Onset   Diabetes Mother    Hypertension Father    Lupus Sister    Colon cancer Neg Hx    Esophageal cancer Neg Hx    Stomach cancer Neg Hx    Rectal cancer Neg Hx    Social History   Socioeconomic History   Marital status: Single    Spouse name: Not on file   Number of children: Not on file   Years of education: Not on file   Highest education level: Not on file  Occupational History   Not on file  Tobacco Use   Smoking status: Former    Types: Cigarettes   Smokeless tobacco: Never  Vaping Use   Vaping status: Never Used  Substance and Sexual Activity   Alcohol use: Not Currently    Alcohol/week: 1.0 standard drink of alcohol    Types: 1 Cans of beer per week    Comment: occasionally   Drug use: Not Currently    Types: Marijuana   Sexual activity: Yes    Birth control/protection: None  Other Topics Concern   Not on file  Social History Narrative   Not on file   Social Drivers of Health   Financial Resource Strain: Low Risk  (03/28/2024)   Overall Financial Resource Strain (CARDIA)    Difficulty of Paying Living Expenses: Not hard at all  Food Insecurity: Food Insecurity Present (03/28/2024)   Hunger Vital Sign    Worried About Running Out of Food in the Last Year: Sometimes true    Ran Out of Food in the Last Year: Sometimes true  Transportation Needs: No Transportation Needs (03/28/2024)   PRAPARE - Administrator, Civil Service (Medical): No    Lack of Transportation (Non-Medical): No  Physical Activity: Inactive (03/28/2024)   Exercise Vital Sign    Days of Exercise per Week: 0 days    Minutes of Exercise per Session: 0 min  Stress: No Stress Concern Present (03/28/2024)   [employer] of Occupational Health - Occupational Stress Questionnaire    Feeling of Stress: Not at all  Social  Connections: Moderately Integrated (03/28/2024)   Social Connection and Isolation Panel    Frequency of Communication with Friends and Family: More than three times a week    Frequency of Social Gatherings with Friends and Family: Once a week    Attends Religious Services: 1 to 4 times per year    Active Member of [employer] or Organizations: No    Attends Banker Meetings: Never    Marital Status: Married    Tobacco  Counseling Counseling given: Not Answered   Clinical Intake:                        Activities of Daily Living    03/28/2024    9:29 AM 09/04/2023    6:48 PM  In your present state of health, do you have any difficulty performing the following activities:  Hearing? 0 0  Vision? 1 0  Comment wear   Difficulty concentrating or making decisions? 1 0  Comment remebering   Walking or climbing stairs? 0   Dressing or bathing? 0   Doing errands, shopping? 0 0  Preparing Food and eating ? N   Using the Toilet? N   In the past six months, have you accidently leaked urine? N   Do you have problems with loss of bowel control? N   Managing your Medications? N   Managing your Finances? N   Housekeeping or managing your Housekeeping? N     Patient Care Team: Oley Bascom RAMAN, NP as PCP - General (Pulmonary Disease)  Indicate any recent Medical Services you may have received from other than [facility] providers in the past year (date may be approximate).     Assessment:   This is a routine wellness examination for [facility].  Hearing/Vision screen No results found.   Goals Addressed             This Visit's Progress    Patient Stated   On track    Patient states his goal is to get healthier        Depression Screen    03/28/2024    9:43 AM 03/28/2024    9:39 AM 09/01/2023    1:21 PM 03/23/2023   11:03 AM 03/02/2023    1:28 PM 09/05/2022    2:44 PM 01/07/2022   11:47 AM  PHQ 2/9 Scores  PHQ - 2 Score 0 0 0 0 0 0 0    Fall Risk    03/28/2024     9:42 AM 09/01/2023    1:21 PM 03/23/2023   11:06 AM 03/02/2023    1:28 PM 07/08/2022   11:16 AM  Fall Risk   Falls in the past year? 0 0 0 0 0  Number falls in past yr: 0 0 0 0 0  Injury with Fall? 0 0 0 0 0  Risk for fall due to : No Fall Risks No Fall Risks No Fall Risks No Fall Risks   Follow up Falls evaluation completed Falls evaluation completed Falls prevention discussed Falls evaluation completed Falls evaluation completed      Data saved with a previous flowsheet row definition    MEDICARE RISK AT HOME: Medicare Risk at Home Any stairs in or around the home?: Yes If so, are there any without handrails?: No Home free of loose throw rugs in walkways, pet beds, electrical cords, etc?: Yes Adequate lighting in your home to reduce risk of falls?: Yes Life alert?: No Use of a cane, walker or w/c?: No Grab bars in the bathroom?: No Shower chair or bench in shower?: No Elevated toilet seat or a handicapped toilet?: No  TIMED UP AND GO:  Was the test performed?  No    Cognitive Function:        03/28/2024    9:43 AM 03/23/2023   11:07 AM  6CIT Screen  What Year? 0 points 0 points  What month? 0 points 0 points  What time? 0 points  0 points  Count back from 20 0 points 0 points  Months in reverse 0 points 0 points  Repeat phrase 0 points 0 points  Total Score 0 points 0 points    Immunizations Immunization History  Administered Date(s) Administered   Dtap, Unspecified 09/08/1976, 12/29/1976, 02/23/1977, 03/08/1978, 07/16/1980, 10/02/2013   Fluzone Influenza virus vaccine,trivalent (IIV3), split virus 07/19/2012, 09/06/2013, 05/11/2016   HIB (PRP-T) 09/11/2013   Hep A / Hep B 05/27/2009   Influenza, Seasonal, Injecte, Preservative Fre 06/02/2023   Influenza,inj,Quad PF,6+ Mos 06/21/2014, 07/08/2015, 05/11/2016, 07/17/2017, 06/21/2018, 09/30/2019, 07/22/2021, 09/05/2022   Influenza,inj,quad, With Preservative 05/11/2016   Influenza-Unspecified 07/10/2018   MMR  11/09/1977, 10/04/2013   Meningococcal B, OMV 11/26/2018, 01/24/2020   Meningococcal Conjugate 09/11/2013, 11/26/2018, 11/26/2018   PFIZER(Purple Top)SARS-COV-2 Vaccination 12/06/2019, 12/15/2019, 12/30/2019   Pfizer Covid-19 Vaccine Bivalent Booster 85yrs & up 07/30/2021   Pneumococcal Conjugate-13 11/19/2015   Pneumococcal Polysaccharide-23 10/02/2009, 10/23/2017   Polio, Unspecified 09/08/1976, 12/29/1976, 03/08/1978, 07/16/1980   Tdap 07/08/2015   Unspecified SARS-COV-2 Vaccination 12/15/2019, 12/30/2019    TDAP status: Up to date  Flu Vaccine status: Up to date  Pneumococcal vaccine status: Up to date  Covid-19 vaccine status: Declined, Education has been provided regarding the importance of this vaccine but patient still declined. Advised may receive this vaccine at local pharmacy or Health Dept.or vaccine clinic. Aware to provide a copy of the vaccination record if obtained from local pharmacy or Health Dept. Verbalized acceptance and understanding.  Qualifies for Shingles Vaccine? No   Zostavax completed No   Shingrix Completed?: No.    Education has been provided regarding the importance of this vaccine. Patient has been advised to call insurance company to determine out of pocket expense if they have not yet received this vaccine. Advised may also receive vaccine at local pharmacy or Health Dept. Verbalized acceptance and understanding.  Screening Tests Health Maintenance  Topic Date Due   Hepatitis B Vaccines (2 of 3 - 19+ 3-dose series) 06/24/2009   Meningococcal B Vaccine (3 of 4 - Increased Risk Bexsero 3-dose series) 01/23/2021   COVID-19 Vaccine (7 - 2024-25 season) 05/28/2023   INFLUENZA VACCINE  04/26/2024   Medicare Annual Wellness (AWV)  03/28/2025   DTaP/Tdap/Td (8 - Td or Tdap) 07/07/2025   Pneumococcal Vaccine 54-18 Years old (4 of 4 - PCV20 or PCV21) 07/09/2026   Colonoscopy  11/23/2032   Hepatitis C Screening  Completed   HIV Screening  Completed   HPV  VACCINES  Aged Out   Fecal DNA (Cologuard)  Discontinued    Health Maintenance  Health Maintenance Due  Topic Date Due   Hepatitis B Vaccines (2 of 3 - 19+ 3-dose series) 06/24/2009   Meningococcal B Vaccine (3 of 4 - Increased Risk Bexsero 3-dose series) 01/23/2021   COVID-19 Vaccine (7 - 2024-25 season) 05/28/2023    Colorectal cancer screening: Type of screening: Colonoscopy. Completed 11/24/22. Repeat every 10 years  Lung Cancer Screening: (Low Dose CT Chest recommended if Age 68-80 years, 20 pack-year currently smoking OR have quit w/in 15years.) does not qualify.   Lung Cancer Screening Referral: Not of age.  Additional Screening:  Hepatitis C Screening: does not qualify; Completed 08/09/23  Vision Screening: Recommended annual ophthalmology exams for early detection of glaucoma and other disorders of the eye. Is the patient up to date with their annual eye exam?  Yes  Who is the provider or what is the name of the office in which the patient attends annual eye exams?  [facility] If pt is not established with a provider, would they like to be referred to a provider to establish care? No .   Dental Screening: Recommended annual dental exams for proper oral hygiene   Community Resource Referral / Chronic Care Management: CRR required this visit?  No   CCM required this visit?  No     Plan:     I have personally reviewed and noted the following in the patient's chart:   Medical and social history Use of alcohol, tobacco or illicit drugs  Current medications and supplements including opioid prescriptions. Patient is currently taking opioid prescriptions. Information provided to patient regarding non-opioid alternatives. Patient advised to discuss non-opioid treatment plan with their provider. Functional ability and status Nutritional status Physical activity Advanced directives List of other physicians Hospitalizations, surgeries, and ER visits in previous 12  months Vitals Screenings to include cognitive, depression, and falls Referrals and appointments  In addition, I have reviewed and discussed with patient certain preventive protocols, quality metrics, and best practice recommendations. A written personalized care plan for preventive services as well as general preventive health recommendations were provided to patient.     Suzen Shove, RMA   03/28/2024   After Visit Summary: (MyChart) Due to this being a telephonic visit, the after visit summary with patients personalized plan was offered to patient via MyChart   Nurse Notes: Thank you for you time.

## 2024-04-03 ENCOUNTER — Ambulatory Visit (INDEPENDENT_AMBULATORY_CARE_PROVIDER_SITE_OTHER): Payer: Self-pay | Admitting: Nurse Practitioner

## 2024-04-03 ENCOUNTER — Encounter: Payer: Self-pay | Admitting: Nurse Practitioner

## 2024-04-03 VITALS — BP 132/77 | HR 108 | Temp 98.4°F | Wt 137.2 lb

## 2024-04-03 DIAGNOSIS — M25531 Pain in right wrist: Secondary | ICD-10-CM

## 2024-04-03 DIAGNOSIS — D571 Sickle-cell disease without crisis: Secondary | ICD-10-CM | POA: Diagnosis not present

## 2024-04-03 NOTE — Progress Notes (Signed)
 Subjective   Patient ID: Shawn Adams, male    DOB: July 07, 1976, 48 y.o.   MRN: [id number]  Chief Complaint  Patient presents with   Medical Management of Chronic Issues    Patient stated that his right wrist been painful x 2 months     Referring provider: Oley Bascom RAMAN, NP  [name] is a 48 y.o. male with Past Medical History: No date: Aneurysm (HCC)     Comment:  Pt states happened in 2011, Brain  No date: Asthma No date: CKD (chronic kidney disease) stage 3, GFR 30-59 ml/min (HCC) No date: Heart murmur No date: Pulmonary sarcoidosis (HCC) No date: Sarcoid No date: Sickle cell anemia (HCC)   HPI  Patient presents today for a follow-up visit. He is seen by pulmonary through [facility] for sarcoidosis and he is also seen through hematology through [facility] for sickle cell disease. Overall patient has been doing well.  He states that he has been having wrist pain for the past 2 months.  He thinks that he may have carpal tunnel syndrome and would like a referral to neurology.  Denies f/c/s, n/v/d, hemoptysis, PND, leg swelling. Denies chest pain or edema.     No Known Allergies  Immunization History  Administered Date(s) Administered   Dtap, Unspecified 09/08/1976, 12/29/1976, 02/23/1977, 03/08/1978, 07/16/1980, 10/02/2013   Fluzone Influenza virus vaccine,trivalent (IIV3), split virus 07/19/2012, 09/06/2013, 05/11/2016   HIB (PRP-T) 09/11/2013   Hep A / Hep B 05/27/2009   Influenza, Seasonal, Injecte, Preservative Fre 06/02/2023   Influenza,inj,Quad PF,6+ Mos 06/21/2014, 07/08/2015, 05/11/2016, 07/17/2017, 06/21/2018, 09/30/2019, 07/22/2021, 09/05/2022   Influenza,inj,quad, With Preservative 05/11/2016   Influenza-Unspecified 07/10/2018   MMR 11/09/1977, 10/04/2013   Meningococcal B, OMV 11/26/2018, 01/24/2020   Meningococcal Conjugate 09/11/2013, 11/26/2018, 11/26/2018   PFIZER(Purple Top)SARS-COV-2 Vaccination 12/06/2019, 12/15/2019, 12/30/2019   Pfizer  Covid-19 Vaccine Bivalent Booster 72yrs & up 07/30/2021   Pneumococcal Conjugate-13 11/19/2015   Pneumococcal Polysaccharide-23 10/02/2009, 10/23/2017   Polio, Unspecified 09/08/1976, 12/29/1976, 03/08/1978, 07/16/1980   Tdap 07/08/2015   Unspecified SARS-COV-2 Vaccination 12/15/2019, 12/30/2019    Tobacco History: Social History   Tobacco Use  Smoking Status Former   Types: Cigarettes  Smokeless Tobacco Never   Counseling given: Not Answered   Outpatient Encounter Medications as of 04/03/2024  Medication Sig   acetaminophen  (TYLENOL ) 500 MG tablet Take 500 mg by mouth every 6 (six) hours as needed for moderate pain or fever (pain).    albuterol  (VENTOLIN  HFA) 108 (90 Base) MCG/ACT inhaler Inhale 1-2 puffs into the lungs every 6 (six) hours as needed for wheezing or shortness of breath.   Ascorbic Acid  (VITAMIN C  PO) Take 1 tablet by mouth daily.   budesonide-formoterol  (SYMBICORT) 160-4.5 MCG/ACT inhaler Inhale 2 puffs into the lungs 2 (two) times daily.   cyanocobalamin  50 MCG tablet Take 100 mcg by mouth daily.   docusate sodium  (COLACE) 100 MG capsule Take 100 mg by mouth daily as needed for mild constipation.   folic acid  (FOLVITE ) 400 MCG tablet Take 400 mcg by mouth daily.   hydroxyurea  (HYDREA ) 500 MG capsule Take 2 capsules (1,000 mg total) by mouth daily. May take with food to minimize GI side effects.   losartan  (COZAAR ) 25 MG tablet Take 25 mg by mouth daily.   ondansetron  (ZOFRAN ) 4 MG tablet Take 4 mg by mouth every 8 (eight) hours as needed for vomiting or nausea.   Oxycodone  HCl 20 MG TABS Take 1 tablet by mouth daily as needed (For pain).  pantoprazole  (PROTONIX ) 40 MG tablet TAKE 1 TABLET(40 MG) BY MOUTH DAILY-NEEDS APPT FOR FURTHER REFILLS   sodium bicarbonate  650 MG tablet Take 650 mg by mouth daily.   tadalafil (CIALIS) 10 MG tablet Take 10 mg by mouth daily as needed for erectile dysfunction.   Vitamin D , Ergocalciferol , (DRISDOL ) 1.25 MG (50000 UNIT) CAPS  capsule TAKE 1 CAPSULE BY MOUTH EVERY 7 DAYS   hydroxychloroquine  (PLAQUENIL ) 200 MG tablet Take 200 mg by mouth daily. (Patient not taking: Reported on 04/03/2024)   Melatonin 10 MG TABS Take 10 mg by mouth at bedtime. (Patient not taking: Reported on 04/03/2024)   No facility-administered encounter medications on file as of 04/03/2024.    Review of Systems  Review of Systems  Constitutional: Negative.   HENT: Negative.    Cardiovascular: Negative.   Gastrointestinal: Negative.   Allergic/Immunologic: Negative.   Neurological: Negative.   Psychiatric/Behavioral: Negative.       Objective:   BP 132/77   Pulse (!) 108   Temp 98.4 F (36.9 C) (Oral)   Wt 137 lb 3.2 oz (62.2 kg)   SpO2 93%   BMI 18.61 kg/m   Wt Readings from Last 5 Encounters:  04/03/24 137 lb 3.2 oz (62.2 kg)  03/28/24 137 lb (62.1 kg)  01/01/24 137 lb 6.4 oz (62.3 kg)  09/04/23 140 lb (63.5 kg)  09/01/23 139 lb 12.8 oz (63.4 kg)     Physical Exam Vitals and nursing note reviewed.  Constitutional:      General: He is not in acute distress.    Appearance: He is well-developed.  Cardiovascular:     Rate and Rhythm: Normal rate and regular rhythm.  Pulmonary:     Effort: Pulmonary effort is normal.     Breath sounds: Normal breath sounds.  Musculoskeletal:     Right wrist: Tenderness present. No swelling. Normal range of motion.       Arms:  Skin:    General: Skin is warm and dry.  Neurological:     Mental Status: He is alert and oriented to person, place, and time.       Assessment & Plan:   Right wrist pain -     Ambulatory referral to Neurology  Sickle cell disease without crisis (HCC) -     Sickle Cell Panel     Return in about 3 months (around 07/04/2024).   Bascom GORMAN Borer, NP 04/03/2024

## 2024-04-04 ENCOUNTER — Telehealth: Payer: Self-pay

## 2024-04-04 ENCOUNTER — Ambulatory Visit: Payer: Self-pay | Admitting: Nurse Practitioner

## 2024-04-04 DIAGNOSIS — N1832 Chronic kidney disease, stage 3b: Secondary | ICD-10-CM | POA: Diagnosis not present

## 2024-04-04 NOTE — Telephone Encounter (Signed)
 No answer and could not lvm. Pt was sent a my chart message and asked to reply to make sure he was reached. KH

## 2024-04-08 LAB — CMP14+CBC/D/PLT+FER+RETIC+V...
ALT: 15 IU/L (ref 0–44)
AST: 23 IU/L (ref 0–40)
Albumin: 3.6 g/dL — ABNORMAL LOW (ref 4.1–5.1)
Alkaline Phosphatase: 78 IU/L (ref 44–121)
BUN/Creatinine Ratio: 17 (ref 9–20)
BUN: 44 mg/dL — ABNORMAL HIGH (ref 6–24)
Basophils Absolute: 0 x10E3/uL (ref 0.0–0.2)
Basos: 1 %
Bilirubin Total: 1.1 mg/dL (ref 0.0–1.2)
Calcium: 9.3 mg/dL (ref 8.7–10.2)
Chloride: 111 mmol/L — ABNORMAL HIGH (ref 96–106)
Creatinine, Ser: 2.63 mg/dL — ABNORMAL HIGH (ref 0.76–1.27)
EOS (ABSOLUTE): 0.2 x10E3/uL (ref 0.0–0.4)
Eos: 4 %
Ferritin: 3353 ng/mL — ABNORMAL HIGH (ref 30–400)
Globulin, Total: 2.9 g/dL (ref 1.5–4.5)
Glucose: 109 mg/dL — ABNORMAL HIGH (ref 70–99)
Hematocrit: 15.1 % — CL (ref 37.5–51.0)
Hemoglobin: 5.1 g/dL — CL (ref 13.0–17.7)
Immature Grans (Abs): 0.3 x10E3/uL — ABNORMAL HIGH (ref 0.0–0.1)
Immature Granulocytes: 5 %
Lymphocytes Absolute: 1.1 x10E3/uL (ref 0.7–3.1)
Lymphs: 16 %
MCH: 32.3 pg (ref 26.6–33.0)
MCHC: 33.8 g/dL (ref 31.5–35.7)
MCV: 96 fL (ref 79–97)
Monocytes Absolute: 0.6 x10E3/uL (ref 0.1–0.9)
Monocytes: 8 %
NRBC: 5 % — ABNORMAL HIGH (ref 0–0)
Neutrophils Absolute: 4.6 x10E3/uL (ref 1.4–7.0)
Neutrophils: 66 %
Platelets: 397 x10E3/uL (ref 150–450)
Potassium: 4.1 mmol/L (ref 3.5–5.2)
RBC: 1.58 x10E6/uL — CL (ref 4.14–5.80)
RDW: 19.8 % — ABNORMAL HIGH (ref 11.6–15.4)
Retic Ct Pct: 11.5 % — ABNORMAL HIGH (ref 0.6–2.6)
Sodium: 138 mmol/L (ref 134–144)
Total Protein: 6.5 g/dL (ref 6.0–8.5)
Vit D, 25-Hydroxy: 18.6 ng/mL — ABNORMAL LOW (ref 30.0–100.0)
WBC: 6.9 x10E3/uL (ref 3.4–10.8)
eGFR: 29 mL/min/1.73 — ABNORMAL LOW (ref >59)

## 2024-04-09 DIAGNOSIS — D571 Sickle-cell disease without crisis: Secondary | ICD-10-CM | POA: Diagnosis not present

## 2024-04-09 DIAGNOSIS — D500 Iron deficiency anemia secondary to blood loss (chronic): Secondary | ICD-10-CM | POA: Diagnosis not present

## 2024-05-09 DIAGNOSIS — D5700 Hb-SS disease with crisis, unspecified: Secondary | ICD-10-CM | POA: Diagnosis not present

## 2024-05-09 DIAGNOSIS — D631 Anemia in chronic kidney disease: Secondary | ICD-10-CM | POA: Diagnosis not present

## 2024-05-09 DIAGNOSIS — D730 Hyposplenism: Secondary | ICD-10-CM | POA: Diagnosis not present

## 2024-05-09 DIAGNOSIS — I671 Cerebral aneurysm, nonruptured: Secondary | ICD-10-CM | POA: Diagnosis not present

## 2024-05-09 DIAGNOSIS — D500 Iron deficiency anemia secondary to blood loss (chronic): Secondary | ICD-10-CM | POA: Diagnosis not present

## 2024-05-09 DIAGNOSIS — E559 Vitamin D deficiency, unspecified: Secondary | ICD-10-CM | POA: Diagnosis not present

## 2024-05-09 DIAGNOSIS — N1832 Chronic kidney disease, stage 3b: Secondary | ICD-10-CM | POA: Diagnosis not present

## 2024-05-09 DIAGNOSIS — T8089XA Other complications following infusion, transfusion and therapeutic injection, initial encounter: Secondary | ICD-10-CM | POA: Diagnosis not present

## 2024-05-09 DIAGNOSIS — D571 Sickle-cell disease without crisis: Secondary | ICD-10-CM | POA: Diagnosis not present

## 2024-05-09 DIAGNOSIS — G8929 Other chronic pain: Secondary | ICD-10-CM | POA: Diagnosis not present

## 2024-05-09 DIAGNOSIS — D869 Sarcoidosis, unspecified: Secondary | ICD-10-CM | POA: Diagnosis not present

## 2024-05-13 ENCOUNTER — Ambulatory Visit

## 2024-05-13 NOTE — Progress Notes (Deleted)
 New Patient Pulmonology Office Visit   Subjective:  Patient ID: Shawn Adams, male    DOB: 23-Feb-1976  MRN: [id number]  Referred by: Yolande Lamar BROCKS, MD  CC: No chief complaint on file.   HPI [name] is a 48 y.o. male with sickle cell disease with chronic anemia and diagnosed Pulm sarcoidosis. Was following [facility].    Eye appointments:  Heart follow up:  CT chest 2018: Bulky hilar and mediastinal adenopathy.  There is a possible tree-in-bud nodularity in the right lower lobe.  No significant other parenchymal lung abnormality. Echo 2018: Normal EF but dilated LV.  Lung Health: Functional status: *** Covid vaccine: *** Influenza vaccine: *** Pneumonococcal vaccine: *** Smoking: *** Occupational exposure/pets:   {PULM QUESTIONNAIRES (Optional):33196}  Allergies: Patient has no known allergies.  Current Outpatient Medications:    acetaminophen  (TYLENOL ) 500 MG tablet, Take 500 mg by mouth every 6 (six) hours as needed for moderate pain or fever (pain). , Disp: , Rfl:    albuterol  (VENTOLIN  HFA) 108 (90 Base) MCG/ACT inhaler, Inhale 1-2 puffs into the lungs every 6 (six) hours as needed for wheezing or shortness of breath., Disp: 8 g, Rfl: 2   Ascorbic Acid  (VITAMIN C  PO), Take 1 tablet by mouth daily., Disp: , Rfl:    budesonide-formoterol  (SYMBICORT) 160-4.5 MCG/ACT inhaler, Inhale 2 puffs into the lungs 2 (two) times daily., Disp: , Rfl:    cyanocobalamin  50 MCG tablet, Take 100 mcg by mouth daily., Disp: , Rfl:    docusate sodium  (COLACE) 100 MG capsule, Take 100 mg by mouth daily as needed for mild constipation., Disp: , Rfl:    folic acid  (FOLVITE ) 400 MCG tablet, Take 400 mcg by mouth daily., Disp: , Rfl:    hydroxychloroquine  (PLAQUENIL ) 200 MG tablet, Take 200 mg by mouth daily. (Patient not taking: Reported on 04/03/2024), Disp: , Rfl:    hydroxyurea  (HYDREA ) 500 MG capsule, Take 2 capsules (1,000 mg total) by mouth daily. May take with food to  minimize GI side effects., Disp: 180 capsule, Rfl: 3   losartan  (COZAAR ) 25 MG tablet, Take 25 mg by mouth daily., Disp: , Rfl:    Melatonin 10 MG TABS, Take 10 mg by mouth at bedtime. (Patient not taking: Reported on 04/03/2024), Disp: , Rfl:    ondansetron  (ZOFRAN ) 4 MG tablet, Take 4 mg by mouth every 8 (eight) hours as needed for vomiting or nausea., Disp: , Rfl:    Oxycodone  HCl 20 MG TABS, Take 1 tablet by mouth daily as needed (For pain)., Disp: , Rfl:    pantoprazole  (PROTONIX ) 40 MG tablet, TAKE 1 TABLET(40 MG) BY MOUTH DAILY-NEEDS APPT FOR FURTHER REFILLS, Disp: 90 tablet, Rfl: 0   sodium bicarbonate  650 MG tablet, Take 650 mg by mouth daily., Disp: , Rfl:    tadalafil (CIALIS) 10 MG tablet, Take 10 mg by mouth daily as needed for erectile dysfunction., Disp: , Rfl:    Vitamin D , Ergocalciferol , (DRISDOL ) 1.25 MG (50000 UNIT) CAPS capsule, TAKE 1 CAPSULE BY MOUTH EVERY 7 DAYS, Disp: 5 capsule, Rfl: 0 Past Medical History:  Diagnosis Date   Aneurysm (HCC)    Pt states happened in 2011, Brain    Asthma    CKD (chronic kidney disease) stage 3, GFR 30-59 ml/min (HCC)    Heart murmur    Pulmonary sarcoidosis (HCC)    Sarcoid    Sickle cell anemia (HCC)    Past Surgical History:  Procedure Laterality Date   LYMPH NODE BIOPSY  NASAL SINUS SURGERY     Family History  Problem Relation Age of Onset   Diabetes Mother    Hypertension Father    Lupus Sister    Colon cancer Neg Hx    Esophageal cancer Neg Hx    Stomach cancer Neg Hx    Rectal cancer Neg Hx    Social History   Socioeconomic History   Marital status: Single    Spouse name: Not on file   Number of children: Not on file   Years of education: Not on file   Highest education level: Not on file  Occupational History   Not on file  Tobacco Use   Smoking status: Former    Types: Cigarettes   Smokeless tobacco: Never  Vaping Use   Vaping status: Never Used  Substance and Sexual Activity   Alcohol use: Not  Currently    Alcohol/week: 1.0 standard drink of alcohol    Types: 1 Cans of beer per week    Comment: occasionally   Drug use: Not Currently    Types: Marijuana   Sexual activity: Yes    Birth control/protection: None  Other Topics Concern   Not on file  Social History Narrative   Not on file   Social Drivers of Health   Financial Resource Strain: Low Risk  (03/28/2024)   Overall Financial Resource Strain (CARDIA)    Difficulty of Paying Living Expenses: Not hard at all  Food Insecurity: Food Insecurity Present (03/28/2024)   Hunger Vital Sign    Worried About Running Out of Food in the Last Year: Sometimes true    Ran Out of Food in the Last Year: Sometimes true  Transportation Needs: No Transportation Needs (03/28/2024)   PRAPARE - Administrator, Civil Service (Medical): No    Lack of Transportation (Non-Medical): No  Physical Activity: Inactive (03/28/2024)   Exercise Vital Sign    Days of Exercise per Week: 0 days    Minutes of Exercise per Session: 0 min  Stress: No Stress Concern Present (03/28/2024)   [employer] of Occupational Health - Occupational Stress Questionnaire    Feeling of Stress: Not at all  Social Connections: Moderately Integrated (03/28/2024)   Social Connection and Isolation Panel    Frequency of Communication with Friends and Family: More than three times a week    Frequency of Social Gatherings with Friends and Family: Once a week    Attends Religious Services: 1 to 4 times per year    Active Member of [employer] or Organizations: No    Attends Banker Meetings: Never    Marital Status: Married  Catering manager Violence: Not At Risk (03/28/2024)   Humiliation, Afraid, Rape, and Kick questionnaire    Fear of Current or Ex-Partner: No    Emotionally Abused: No    Physically Abused: No    Sexually Abused: No       Objective:  There were no vitals taken for this visit. {Pulm Vitals (Optional):32837}  General: Distress/not in  distress patient appearing ill/healthy Lungs: clear to auscultation bilaterally.  Heart: regular rate rhythm, no murmur appreciated.  Abdomen: non tender, non distended. Normal BS.  Neuro: axox***.  ***   Diagnostic Review:  {Labs (Optional):32838}     Assessment & Plan:   Assessment & Plan   No orders of the defined types were placed in this encounter.     No follow-ups on file.   Rekisha Welling, MD

## 2024-05-24 DIAGNOSIS — D5700 Hb-SS disease with crisis, unspecified: Secondary | ICD-10-CM | POA: Diagnosis not present

## 2024-05-24 DIAGNOSIS — E559 Vitamin D deficiency, unspecified: Secondary | ICD-10-CM | POA: Diagnosis not present

## 2024-05-24 DIAGNOSIS — D571 Sickle-cell disease without crisis: Secondary | ICD-10-CM | POA: Diagnosis not present

## 2024-05-24 DIAGNOSIS — D631 Anemia in chronic kidney disease: Secondary | ICD-10-CM | POA: Diagnosis not present

## 2024-05-24 DIAGNOSIS — N1832 Chronic kidney disease, stage 3b: Secondary | ICD-10-CM | POA: Diagnosis not present

## 2024-05-24 DIAGNOSIS — D500 Iron deficiency anemia secondary to blood loss (chronic): Secondary | ICD-10-CM | POA: Diagnosis not present

## 2024-06-07 DIAGNOSIS — D571 Sickle-cell disease without crisis: Secondary | ICD-10-CM | POA: Diagnosis not present

## 2024-06-07 DIAGNOSIS — D508 Other iron deficiency anemias: Secondary | ICD-10-CM | POA: Diagnosis not present

## 2024-06-07 DIAGNOSIS — D500 Iron deficiency anemia secondary to blood loss (chronic): Secondary | ICD-10-CM | POA: Diagnosis not present

## 2024-06-07 DIAGNOSIS — N1832 Chronic kidney disease, stage 3b: Secondary | ICD-10-CM | POA: Diagnosis not present

## 2024-06-07 DIAGNOSIS — D631 Anemia in chronic kidney disease: Secondary | ICD-10-CM | POA: Diagnosis not present

## 2024-06-07 DIAGNOSIS — D5700 Hb-SS disease with crisis, unspecified: Secondary | ICD-10-CM | POA: Diagnosis not present

## 2024-06-17 ENCOUNTER — Ambulatory Visit (INDEPENDENT_AMBULATORY_CARE_PROVIDER_SITE_OTHER)

## 2024-06-17 VITALS — BP 128/72 | HR 84 | Ht 72.0 in | Wt 135.6 lb

## 2024-06-17 DIAGNOSIS — D869 Sarcoidosis, unspecified: Secondary | ICD-10-CM

## 2024-06-17 DIAGNOSIS — J984 Other disorders of lung: Secondary | ICD-10-CM | POA: Diagnosis not present

## 2024-06-17 DIAGNOSIS — J42 Unspecified chronic bronchitis: Secondary | ICD-10-CM | POA: Diagnosis not present

## 2024-06-17 NOTE — Progress Notes (Signed)
 New Patient Pulmonology Office Visit   Subjective:  Patient ID: Shawn Adams, male    DOB: Aug 11, 1976  MRN: [id number]  Referred by: Yolande Lamar BROCKS, MD  CC:  Chief Complaint  Patient presents with   Consult    Referred for pulmonary sarcoidosis.  States he has some SOB that has not worsened and is only with exertion. Also states he has a productive cough that comes and goes with yellow/green phlegm.    HPI [name] is a 48 y.o. male with asthma, COPD, pulmonary sarcoidosis, sickle cell disease with chronic anemia on chronic blood transfusion, CKD: Presents for establishing care for sarcoidosis :  Hx of sarcoid: Since 2012. Not biopsy proven. Stable per patient. Follows Dr Reyes Piles for it at [facility]. PFT in 2023: mixed obstruction and restriction w reduction in DLCO. On symbicort and incruse and prn albuterol . Using it every 4 hours.  Follows Eye and heart doctor.    Lung Health: Functional status: not much ET. Few feets and he is sob.  Smoking: used to smoke. Now quit. 1/2 pk x 1 month. Stopped since 2011.   Test: CT chest 2018: Reviewed by me: Stable scattered nodules both lungs with largest being 6 mm, bulky mediastinal and bilateral hilar lymph nodes which are not calcified.  Per report these changes are similar to 2017.  Chest x-ray June 2025: Reviewed by me: Interstitial changes with hilar prominence likely from bulky adenopathy from before  Echo 2018: EF 55-60% with mildly dilated ventricle.  Allergies: Patient has no known allergies.  History: Past Medical History:  Diagnosis Date   Aneurysm (HCC)    Pt states happened in 2011, Brain    Asthma    CKD (chronic kidney disease) stage 3, GFR 30-59 ml/min (HCC)    Heart murmur    Pulmonary sarcoidosis    Sarcoid    Sickle cell anemia (HCC)    Past Surgical History:  Procedure Laterality Date   LYMPH NODE BIOPSY     NASAL SINUS SURGERY     Family History  Problem Relation Age of Onset   Diabetes  Mother    Hypertension Father    Lupus Sister    Colon cancer Neg Hx    Esophageal cancer Neg Hx    Stomach cancer Neg Hx    Rectal cancer Neg Hx    Social History   Socioeconomic History   Marital status: Single    Spouse name: Not on file   Number of children: Not on file   Years of education: Not on file   Highest education level: Not on file  Occupational History   Not on file  Tobacco Use   Smoking status: Never   Smokeless tobacco: Never  Vaping Use   Vaping status: Never Used  Substance and Sexual Activity   Alcohol use: Not Currently    Alcohol/week: 1.0 standard drink of alcohol    Types: 1 Cans of beer per week    Comment: occasionally   Drug use: Not Currently    Types: Marijuana   Sexual activity: Yes    Birth control/protection: None  Other Topics Concern   Not on file  Social History Narrative   Not on file   Social Drivers of Health   Financial Resource Strain: Low Risk  (03/28/2024)   Overall Financial Resource Strain (CARDIA)    Difficulty of Paying Living Expenses: Not hard at all  Food Insecurity: Food Insecurity Present (03/28/2024)   Hunger Vital Sign  Worried About Programme researcher, broadcasting/film/video in the Last Year: Sometimes true    [employer] of Food in the Last Year: Sometimes true  Transportation Needs: No Transportation Needs (03/28/2024)   PRAPARE - Administrator, Civil Service (Medical): No    Lack of Transportation (Non-Medical): No  Physical Activity: Inactive (03/28/2024)   Exercise Vital Sign    Days of Exercise per Week: 0 days    Minutes of Exercise per Session: 0 min  Stress: No Stress Concern Present (03/28/2024)   [employer] of Occupational Health - Occupational Stress Questionnaire    Feeling of Stress: Not at all  Social Connections: Moderately Integrated (03/28/2024)   Social Connection and Isolation Panel    Frequency of Communication with Friends and Family: More than three times a week    Frequency of Social Gatherings  with Friends and Family: Once a week    Attends Religious Services: 1 to 4 times per year    Active Member of [employer] or Organizations: No    Attends Banker Meetings: Never    Marital Status: Married  Catering manager Violence: Not At Risk (03/28/2024)   Humiliation, Afraid, Rape, and Kick questionnaire    Fear of Current or Ex-Partner: No    Emotionally Abused: No    Physically Abused: No    Sexually Abused: No    Immunization status: Immunization History  Administered Date(s) Administered   Dtap, Unspecified 09/08/1976, 12/29/1976, 02/23/1977, 03/08/1978, 07/16/1980, 10/02/2013   Fluzone Influenza virus vaccine,trivalent (IIV3), split virus 07/19/2012, 09/06/2013, 05/11/2016   HIB (PRP-T) 09/11/2013   Hep A / Hep B 05/27/2009   Influenza, Seasonal, Injecte, Preservative Fre 06/02/2023   Influenza,inj,Quad PF,6+ Mos 06/21/2014, 07/08/2015, 05/11/2016, 07/17/2017, 06/21/2018, 09/30/2019, 07/22/2021, 09/05/2022   Influenza,inj,quad, With Preservative 05/11/2016   Influenza-Unspecified 07/10/2018   MMR 11/09/1977, 10/04/2013   Meningococcal B, OMV 11/26/2018, 01/24/2020   Meningococcal Conjugate 09/11/2013, 11/26/2018, 11/26/2018   PFIZER(Purple Top)SARS-COV-2 Vaccination 12/06/2019, 12/15/2019, 12/30/2019   Pfizer Covid-19 Vaccine Bivalent Booster 7yrs & up 07/30/2021   Pneumococcal Conjugate-13 11/19/2015   Pneumococcal Polysaccharide-23 10/02/2009, 10/23/2017   Polio, Unspecified 09/08/1976, 12/29/1976, 03/08/1978, 07/16/1980   Tdap 07/08/2015   Unspecified SARS-COV-2 Vaccination 12/15/2019, 12/30/2019    Current Meds:  Current Outpatient Medications:    acetaminophen  (TYLENOL ) 500 MG tablet, Take 500 mg by mouth every 6 (six) hours as needed for moderate pain or fever (pain). , Disp: , Rfl:    albuterol  (VENTOLIN  HFA) 108 (90 Base) MCG/ACT inhaler, Inhale 1-2 puffs into the lungs every 6 (six) hours as needed for wheezing or shortness of breath., Disp: 8 g, Rfl:  2   Ascorbic Acid  (VITAMIN C  PO), Take 1 tablet by mouth daily., Disp: , Rfl:    budesonide-formoterol  (SYMBICORT) 160-4.5 MCG/ACT inhaler, Inhale 2 puffs into the lungs 2 (two) times daily., Disp: , Rfl:    cyanocobalamin  50 MCG tablet, Take 100 mcg by mouth daily., Disp: , Rfl:    docusate sodium  (COLACE) 100 MG capsule, Take 100 mg by mouth daily as needed for mild constipation., Disp: , Rfl:    folic acid  (FOLVITE ) 400 MCG tablet, Take 400 mcg by mouth daily., Disp: , Rfl:    hydroxychloroquine  (PLAQUENIL ) 200 MG tablet, Take 200 mg by mouth daily., Disp: , Rfl:    hydroxyurea  (HYDREA ) 500 MG capsule, Take 2 capsules (1,000 mg total) by mouth daily. May take with food to minimize GI side effects., Disp: 180 capsule, Rfl: 3   losartan  (COZAAR ) 25 MG  tablet, Take 25 mg by mouth daily., Disp: , Rfl:    Melatonin 10 MG TABS, Take 10 mg by mouth at bedtime., Disp: , Rfl:    ondansetron  (ZOFRAN ) 4 MG tablet, Take 4 mg by mouth every 8 (eight) hours as needed for vomiting or nausea., Disp: , Rfl:    Oxycodone  HCl 20 MG TABS, Take 1 tablet by mouth daily as needed (For pain)., Disp: , Rfl:    pantoprazole  (PROTONIX ) 40 MG tablet, TAKE 1 TABLET(40 MG) BY MOUTH DAILY-NEEDS APPT FOR FURTHER REFILLS, Disp: 90 tablet, Rfl: 0   sodium bicarbonate  650 MG tablet, Take 650 mg by mouth daily., Disp: , Rfl:    tadalafil (CIALIS) 10 MG tablet, Take 10 mg by mouth daily as needed for erectile dysfunction., Disp: , Rfl:    Vitamin D , Ergocalciferol , (DRISDOL ) 1.25 MG (50000 UNIT) CAPS capsule, TAKE 1 CAPSULE BY MOUTH EVERY 7 DAYS (Patient not taking: Reported on 06/17/2024), Disp: 5 capsule, Rfl: 0      Objective:  BP 128/72   Pulse 84   Ht 6' (1.829 m)   Wt 135 lb 9.6 oz (61.5 kg)   SpO2 98% Comment: RA  BMI 18.39 kg/m  BMI Readings from Last 3 Encounters:  06/17/24 18.39 kg/m  04/03/24 18.61 kg/m  03/28/24 18.58 kg/m   SpO2 Readings from Last 3 Encounters:  06/17/24 98%  04/03/24 93%  03/01/24  96%    General: cachetic looking male in no distress.  Lungs: clear to auscultation bilaterally.  Heart: regular rate rhythm, no murmur appreciated.  Abdomen: non tender, non distended. Normal BS.  Neuro: axox3.  Moving all extremities.    Diagnostic Review:  Last metabolic panel Lab Results  Component Value Date   GLUCOSE 109 (H) 04/03/2024   NA 138 04/03/2024   K 4.1 04/03/2024   CL 111 (H) 04/03/2024   CO2 CANCELED 04/03/2024   BUN 44 (H) 04/03/2024   CREATININE 2.63 (H) 04/03/2024   EGFR 29 (L) 04/03/2024   CALCIUM 9.3 04/03/2024   PHOS 4.7 (H) 02/23/2023   PROT 6.5 04/03/2024   ALBUMIN 3.6 (L) 04/03/2024   LABGLOB 2.9 04/03/2024   AGRATIO 1.4 10/10/2022   BILITOT 1.1 04/03/2024   ALKPHOS 78 04/03/2024   AST 23 04/03/2024   ALT 15 04/03/2024   ANIONGAP 9 03/01/2024       Assessment & Plan:   Assessment & Plan Sarcoidosis Follows Pulmonary at [facility]. Plans to continue to follow with them.  Will defer further sarcoid treatment to Dr Viviano from Pulmonary.  Has opthal and cardio follow up per patient.  Restrictive lung disease Will defer further work up and following his PFTs to Dr Viviano.  Chronic bronchitis, unspecified chronic bronchitis type (HCC) Cont inhalers per Dr Viviano.   No orders of the defined types were placed in this encounter.   No follow up needed. Follow up with regular pulm doctor.   Kelon Easom, MD

## 2024-06-17 NOTE — Assessment & Plan Note (Signed)
 Cont inhalers per Dr Viviano.

## 2024-06-21 DIAGNOSIS — D571 Sickle-cell disease without crisis: Secondary | ICD-10-CM | POA: Diagnosis not present

## 2024-06-21 DIAGNOSIS — D631 Anemia in chronic kidney disease: Secondary | ICD-10-CM | POA: Diagnosis not present

## 2024-06-21 DIAGNOSIS — D5700 Hb-SS disease with crisis, unspecified: Secondary | ICD-10-CM | POA: Diagnosis not present

## 2024-06-21 DIAGNOSIS — D500 Iron deficiency anemia secondary to blood loss (chronic): Secondary | ICD-10-CM | POA: Diagnosis not present

## 2024-06-21 DIAGNOSIS — D508 Other iron deficiency anemias: Secondary | ICD-10-CM | POA: Diagnosis not present

## 2024-06-21 DIAGNOSIS — N1832 Chronic kidney disease, stage 3b: Secondary | ICD-10-CM | POA: Diagnosis not present

## 2024-07-04 ENCOUNTER — Ambulatory Visit (INDEPENDENT_AMBULATORY_CARE_PROVIDER_SITE_OTHER): Payer: Self-pay | Admitting: Nurse Practitioner

## 2024-07-04 ENCOUNTER — Encounter: Payer: Self-pay | Admitting: Nurse Practitioner

## 2024-07-04 VITALS — BP 159/88 | HR 68 | Resp 16 | Wt 134.8 lb

## 2024-07-04 DIAGNOSIS — Z23 Encounter for immunization: Secondary | ICD-10-CM

## 2024-07-04 DIAGNOSIS — D571 Sickle-cell disease without crisis: Secondary | ICD-10-CM

## 2024-07-04 DIAGNOSIS — Z113 Encounter for screening for infections with a predominantly sexual mode of transmission: Secondary | ICD-10-CM | POA: Diagnosis not present

## 2024-07-04 DIAGNOSIS — R1031 Right lower quadrant pain: Secondary | ICD-10-CM

## 2024-07-04 DIAGNOSIS — R10A3 Flank pain, bilateral: Secondary | ICD-10-CM | POA: Diagnosis not present

## 2024-07-04 DIAGNOSIS — R1032 Left lower quadrant pain: Secondary | ICD-10-CM

## 2024-07-04 LAB — POCT URINALYSIS DIP (CLINITEK)
Bilirubin, UA: NEGATIVE
Glucose, UA: NEGATIVE mg/dL
Ketones, POC UA: NEGATIVE mg/dL
Leukocytes, UA: NEGATIVE
Nitrite, UA: NEGATIVE
POC PROTEIN,UA: >=300 — AB
Spec Grav, UA: 1.02 (ref 1.010–1.025)
Urobilinogen, UA: 0.2 U/dL
pH, UA: 6.5 (ref 5.0–8.0)

## 2024-07-04 NOTE — Patient Instructions (Signed)

## 2024-07-04 NOTE — Progress Notes (Signed)
 Subjective   Patient ID: Shawn Adams, male    DOB: [birth year], 48 y.o.   MRN: [id number]  Chief Complaint  Patient presents with   Flank Pain    B/l Comes and goes  Started 2 weeks ago Taking tylenol  and oxycodone  for the pain     Referring provider: Oley Bascom RAMAN, NP  Sherrill Mckamie is a 48 y.o. male with Past Medical History: No date: Aneurysm     Comment:  Pt states happened in 2011, Brain  No date: Asthma No date: CKD (chronic kidney disease) stage 3, GFR 30-59 ml/min (HCC) No date: Heart murmur No date: Pulmonary sarcoidosis No date: Sarcoid No date: Sickle cell anemia (HCC)   HPI   Patient presents today for a follow-up visit.  He is seen by pulmonary through [facility] for sarcoidosis and he is also seen through hematology through [facility] for sickle cell disease. Also followed by nephrology for CKD.  Patient presents today with bilateral flank and groin pain.  He states this has been intermittent for the past few weeks.  He has had this issue in the remote past.  We will order labs today including STD screening and pelvic ultrasound.  Denies f/c/s, n/v/d, hemoptysis, PND, leg swelling. Denies chest pain or edema.    No Known Allergies  Immunization History  Administered Date(s) Administered   Dtap, Unspecified 09/08/1976, 12/29/1976, 02/23/1977, 03/08/1978, 07/16/1980, 10/02/2013   Fluzone Influenza virus vaccine,trivalent (IIV3), split virus 07/19/2012, 09/06/2013, 05/11/2016   HIB (PRP-T) 09/11/2013   Hep A / Hep B 05/27/2009   Influenza, Seasonal, Injecte, Preservative Fre 06/02/2023, 07/04/2024   Influenza,inj,Quad PF,6+ Mos 06/21/2014, 07/08/2015, 05/11/2016, 07/17/2017, 06/21/2018, 09/30/2019, 07/22/2021, 09/05/2022   Influenza,inj,quad, With Preservative 05/11/2016   Influenza-Unspecified 07/10/2018   MMR 11/09/1977, 10/04/2013   Meningococcal B, OMV 11/26/2018, 01/24/2020   Meningococcal Conjugate 09/11/2013, 11/26/2018, 11/26/2018    PFIZER(Purple Top)SARS-COV-2 Vaccination 12/06/2019, 12/15/2019, 12/30/2019   Pfizer Covid-19 Vaccine Bivalent Booster 57yrs & up 07/30/2021   Pneumococcal Conjugate-13 11/19/2015   Pneumococcal Polysaccharide-23 10/02/2009, 10/23/2017   Polio, Unspecified 09/08/1976, 12/29/1976, 03/08/1978, 07/16/1980   Tdap 07/08/2015   Unspecified SARS-COV-2 Vaccination 12/15/2019, 12/30/2019    Tobacco History: Social History   Tobacco Use  Smoking Status Never  Smokeless Tobacco Never   Counseling given: Not Answered   Outpatient Encounter Medications as of 07/04/2024  Medication Sig   acetaminophen  (TYLENOL ) 500 MG tablet Take 500 mg by mouth every 6 (six) hours as needed for moderate pain or fever (pain).    albuterol  (VENTOLIN  HFA) 108 (90 Base) MCG/ACT inhaler Inhale 1-2 puffs into the lungs every 6 (six) hours as needed for wheezing or shortness of breath.   Ascorbic Acid  (VITAMIN C  PO) Take 1 tablet by mouth daily.   budesonide-formoterol  (SYMBICORT) 160-4.5 MCG/ACT inhaler Inhale 2 puffs into the lungs 2 (two) times daily.   cyanocobalamin  50 MCG tablet Take 100 mcg by mouth daily.   docusate sodium  (COLACE) 100 MG capsule Take 100 mg by mouth daily as needed for mild constipation.   folic acid  (FOLVITE ) 400 MCG tablet Take 400 mcg by mouth daily.   hydroxychloroquine  (PLAQUENIL ) 200 MG tablet Take 200 mg by mouth daily.   hydroxyurea  (HYDREA ) 500 MG capsule Take 2 capsules (1,000 mg total) by mouth daily. May take with food to minimize GI side effects.   losartan  (COZAAR ) 25 MG tablet Take 25 mg by mouth daily.   Melatonin 10 MG TABS Take 10 mg by mouth at bedtime.   ondansetron  (  ZOFRAN ) 4 MG tablet Take 4 mg by mouth every 8 (eight) hours as needed for vomiting or nausea.   Oxycodone  HCl 20 MG TABS Take 1 tablet by mouth daily as needed (For pain).   pantoprazole  (PROTONIX ) 40 MG tablet TAKE 1 TABLET(40 MG) BY MOUTH DAILY-NEEDS APPT FOR FURTHER REFILLS   sodium bicarbonate  650 MG  tablet Take 650 mg by mouth daily.   tadalafil (CIALIS) 10 MG tablet Take 10 mg by mouth daily as needed for erectile dysfunction.   Vitamin D , Ergocalciferol , (DRISDOL ) 1.25 MG (50000 UNIT) CAPS capsule TAKE 1 CAPSULE BY MOUTH EVERY 7 DAYS (Patient not taking: Reported on 07/04/2024)   No facility-administered encounter medications on file as of 07/04/2024.    Review of Systems  Review of Systems  HENT: Negative.    Respiratory:  Positive for cough and shortness of breath.      Objective:   BP (!) 159/88 (BP Location: Right Arm, Patient Position: Sitting, Cuff Size: Small)   Pulse 68   Resp 16   Wt 134 lb 12.8 oz (61.1 kg)   SpO2 100%   BMI 18.28 kg/m   Wt Readings from Last 5 Encounters:  07/04/24 134 lb 12.8 oz (61.1 kg)  06/17/24 135 lb 9.6 oz (61.5 kg)  04/03/24 137 lb 3.2 oz (62.2 kg)  03/28/24 137 lb (62.1 kg)  01/01/24 137 lb 6.4 oz (62.3 kg)     Physical Exam Vitals and nursing note reviewed.  Constitutional:      General: He is not in acute distress.    Appearance: He is well-developed.  Cardiovascular:     Rate and Rhythm: Regular rhythm.  Pulmonary:     Effort: Pulmonary effort is normal.     Breath sounds: Normal breath sounds.  Skin:    General: Skin is warm and dry.  Neurological:     Mental Status: He is alert and oriented to person, place, and time.       Assessment & Plan:   Immunization due -     Flu vaccine trivalent PF, 6mos and older(Flulaval,Afluria,Fluarix,Fluzone)  Bilateral flank pain -     POCT URINALYSIS DIP (CLINITEK)  Screen for STD (sexually transmitted disease) -     ToxAssure Flex 15, Ur -     Chlamydia/Gonococcus/Trichomonas, NAA -     RPR+HIV+GC+CT Panel  Sickle cell disease without crisis (HCC) -     Sickle Cell Panel  Bilateral groin pain -     US  PELVIS (TRANSABDOMINAL ONLY)     Return in about 3 months (around 10/04/2024).   Bascom GORMAN Borer, NP 07/04/2024

## 2024-07-05 ENCOUNTER — Ambulatory Visit: Payer: Self-pay | Admitting: Nurse Practitioner

## 2024-07-05 DIAGNOSIS — D571 Sickle-cell disease without crisis: Secondary | ICD-10-CM | POA: Diagnosis not present

## 2024-07-05 DIAGNOSIS — N1832 Chronic kidney disease, stage 3b: Secondary | ICD-10-CM | POA: Diagnosis not present

## 2024-07-05 DIAGNOSIS — D5700 Hb-SS disease with crisis, unspecified: Secondary | ICD-10-CM | POA: Diagnosis not present

## 2024-07-05 DIAGNOSIS — D631 Anemia in chronic kidney disease: Secondary | ICD-10-CM | POA: Diagnosis not present

## 2024-07-05 LAB — CMP14+CBC/D/PLT+FER+RETIC+V...
ALT: 23 IU/L (ref 0–44)
AST: 37 IU/L (ref 0–40)
Albumin: 3.7 g/dL — ABNORMAL LOW (ref 4.1–5.1)
Alkaline Phosphatase: 116 IU/L (ref 47–123)
BUN/Creatinine Ratio: 10 (ref 9–20)
BUN: 26 mg/dL — ABNORMAL HIGH (ref 6–24)
Basophils Absolute: 0.1 x10E3/uL (ref 0.0–0.2)
Basos: 1 %
Bilirubin Total: 1.2 mg/dL (ref 0.0–1.2)
CO2: 18 mmol/L — ABNORMAL LOW (ref 20–29)
Calcium: 10 mg/dL (ref 8.7–10.2)
Chloride: 107 mmol/L — ABNORMAL HIGH (ref 96–106)
Creatinine, Ser: 2.54 mg/dL — ABNORMAL HIGH (ref 0.76–1.27)
EOS (ABSOLUTE): 0.2 x10E3/uL (ref 0.0–0.4)
Eos: 5 %
Ferritin: 4428 ng/mL — ABNORMAL HIGH (ref 30–400)
Globulin, Total: 3.3 g/dL (ref 1.5–4.5)
Glucose: 86 mg/dL (ref 70–99)
Hematocrit: 20.1 % — ABNORMAL LOW (ref 37.5–51.0)
Hemoglobin: 6.5 g/dL — CL (ref 13.0–17.7)
Immature Grans (Abs): 0.1 x10E3/uL (ref 0.0–0.1)
Immature Granulocytes: 1 %
Lymphocytes Absolute: 1.6 x10E3/uL (ref 0.7–3.1)
Lymphs: 37 %
MCH: 31.7 pg (ref 26.6–33.0)
MCHC: 32.3 g/dL (ref 31.5–35.7)
MCV: 98 fL — ABNORMAL HIGH (ref 79–97)
Monocytes Absolute: 0.4 x10E3/uL (ref 0.1–0.9)
Monocytes: 9 %
NRBC: 2 % — ABNORMAL HIGH (ref 0–0)
Neutrophils Absolute: 2.1 x10E3/uL (ref 1.4–7.0)
Neutrophils: 47 %
Platelets: 394 x10E3/uL (ref 150–450)
Potassium: 3.9 mmol/L (ref 3.5–5.2)
RBC: 2.05 x10E6/uL — CL (ref 4.14–5.80)
RDW: 20.2 % — ABNORMAL HIGH (ref 11.6–15.4)
Retic Ct Pct: 9 % — ABNORMAL HIGH (ref 0.6–2.6)
Sodium: 139 mmol/L (ref 134–144)
Total Protein: 7 g/dL (ref 6.0–8.5)
Vit D, 25-Hydroxy: 17.4 ng/mL — ABNORMAL LOW (ref 30.0–100.0)
WBC: 4.4 x10E3/uL (ref 3.4–10.8)
eGFR: 31 mL/min/1.73 — ABNORMAL LOW (ref >59)

## 2024-07-07 LAB — RPR+HIV+GC+CT PANEL
Chlamydia trachomatis, NAA: NEGATIVE
HIV Screen 4th Generation wRfx: NONREACTIVE
Neisseria Gonorrhoeae by PCR: NEGATIVE
RPR Ser Ql: NONREACTIVE

## 2024-07-07 LAB — CHLAMYDIA/GONOCOCCUS/TRICHOMONAS, NAA
Chlamydia by NAA: NEGATIVE
Gonococcus by NAA: NEGATIVE
Trich vag by NAA: NEGATIVE

## 2024-07-08 LAB — TOXASSURE FLEX 15, UR
6-ACETYLMORPHINE IA: NEGATIVE ng/mL
7-aminoclonazepam: NOT DETECTED ng/mg{creat}
AMPHETAMINES IA: NEGATIVE ng/mL
Alpha-hydroxyalprazolam: NOT DETECTED ng/mg{creat}
Alpha-hydroxymidazolam: NOT DETECTED ng/mg{creat}
Alpha-hydroxytriazolam: NOT DETECTED ng/mg{creat}
Alprazolam: NOT DETECTED ng/mg{creat}
BARBITURATES IA: NEGATIVE ng/mL
Buprenorphine: NOT DETECTED ng/mg{creat}
CANNABINOIDS IA: NEGATIVE ng/mL
COCAINE METABOLITE IA: NEGATIVE ng/mL
Clonazepam: NOT DETECTED ng/mg{creat}
Creatinine: 68 mg/dL (ref >=20)
Desalkylflurazepam: NOT DETECTED ng/mg{creat}
Desmethyldiazepam: NOT DETECTED ng/mg{creat}
Desmethylflunitrazepam: NOT DETECTED ng/mg{creat}
Diazepam: NOT DETECTED ng/mg{creat}
ETHYL ALCOHOL Enzymatic: NEGATIVE g/dL
Fentanyl: NOT DETECTED ng/mg{creat}
Flunitrazepam: NOT DETECTED ng/mg{creat}
Lorazepam: NOT DETECTED ng/mg{creat}
METHADONE IA: NEGATIVE ng/mL
METHADONE MTB IA: NEGATIVE ng/mL
Midazolam: NOT DETECTED ng/mg{creat}
Norbuprenorphine: NOT DETECTED ng/mg{creat}
Norfentanyl: NOT DETECTED ng/mg{creat}
OPIATE CLASS IA: NEGATIVE ng/mL
OXYCODONE CLASS IA: NEGATIVE ng/mL
Oxazepam: NOT DETECTED ng/mg{creat}
PHENCYCLIDINE IA: NEGATIVE ng/mL
TAPENTADOL, IA: NEGATIVE ng/mL
TRAMADOL IA: NEGATIVE ng/mL
Temazepam: NOT DETECTED ng/mg{creat}

## 2024-07-08 NOTE — Progress Notes (Signed)
 Pt brother answered the phone and stated Shawn Adams was at work. Advised him to tell his brother to look at his lab results when on Mychart

## 2024-07-10 ENCOUNTER — Telehealth: Payer: Self-pay

## 2024-07-10 NOTE — Telephone Encounter (Signed)
 Spoke with the imaging, advised we were trying to determine cause of pain but they said it was not an appropriate diagnosis.  So told them to rule out hernia.

## 2024-07-10 NOTE — Telephone Encounter (Signed)
 Copied from CRM [id number]. Topic: General - Other >> Jul 09, 2024  9:11 AM Shawn Adams wrote: Reason for CRM: [facility] [place]) Calling regarding a ultrasound in his pelvic area. Needs to know if provider is trying to evaluate a mass on his groin area or a hernia. [phone]. Opt 1 and then Opt 5.

## 2024-07-19 DIAGNOSIS — D571 Sickle-cell disease without crisis: Secondary | ICD-10-CM | POA: Diagnosis not present

## 2024-07-19 DIAGNOSIS — D631 Anemia in chronic kidney disease: Secondary | ICD-10-CM | POA: Diagnosis not present

## 2024-07-19 DIAGNOSIS — D5700 Hb-SS disease with crisis, unspecified: Secondary | ICD-10-CM | POA: Diagnosis not present

## 2024-07-19 DIAGNOSIS — N1832 Chronic kidney disease, stage 3b: Secondary | ICD-10-CM | POA: Diagnosis not present

## 2024-07-19 NOTE — Progress Notes (Signed)
 Pt tolerated well. Discharged alert, oriented, and ambulatory. Clayborne P Burns, RN

## 2024-07-29 ENCOUNTER — Ambulatory Visit: Attending: Internal Medicine | Admitting: Internal Medicine

## 2024-07-29 ENCOUNTER — Encounter: Payer: Self-pay | Admitting: Internal Medicine

## 2024-07-29 ENCOUNTER — Ambulatory Visit: Payer: Self-pay

## 2024-07-29 VITALS — BP 150/77 | HR 78 | Wt 137.0 lb

## 2024-07-29 DIAGNOSIS — R519 Headache, unspecified: Secondary | ICD-10-CM | POA: Diagnosis not present

## 2024-07-29 DIAGNOSIS — I725 Aneurysm of other precerebral arteries: Secondary | ICD-10-CM | POA: Diagnosis not present

## 2024-07-29 DIAGNOSIS — I10 Essential (primary) hypertension: Secondary | ICD-10-CM | POA: Diagnosis not present

## 2024-07-29 MED ORDER — AMLODIPINE BESYLATE 5 MG PO TABS: 5.0000 mg | ORAL_TABLET | Freq: Every day | ORAL | 3 refills | Status: AC

## 2024-07-29 NOTE — Telephone Encounter (Signed)
 FYI Only or Action Required?: FYI only for provider: appointment scheduled on 07/29/24.  Patient was last seen in primary care on 07/04/2024 by Oley Bascom RAMAN, NP.  Called Nurse Triage reporting Headache.  Symptoms began a week ago.  Interventions attempted: OTC medications: tylenol  and Rest, hydration, or home remedies.  Symptoms are: unchanged.  Triage Disposition: See Physician Within 24 Hours  Patient/caregiver understands and will follow disposition?: Yes  Copied from CRM #[id number]. Topic: Clinical - Red Word Triage >> Jul 29, 2024 12:18 PM Shawn Adams wrote: Red Word that prompted transfer to Nurse Triage: Patient says he has a brain aneurysm and is experiencing increased unusual headaches. Dx'd with the aneurysm 10-12 years ago but has not been followed for it in the last 4 years. Reason for Disposition  [1] MODERATE headache (e.g., interferes with normal activities) AND [2] present > 24 hours AND [3] unexplained  (Exceptions: Pain medicines not tried, typical migraine, or headache part of viral illness.)  Answer Assessment - Initial Assessment Questions Additional info: History of aneurysm 10-12 years ago     1. LOCATION: Where does it hurt?      Back of head 2. ONSET: When did the headache start? (e.g., minutes, hours, days)      One week ago  3. PATTERN: Does the pain come and go, or has it been constant since it started?     Intermittent  4. SEVERITY: How bad is the pain? and What does it keep you from doing?  (e.g., Scale 1-10; mild, moderate, or severe)     Throbbing  5. RECURRENT SYMPTOM: Have you ever had headaches before? If Yes, ask: When was the last time? and What happened that time?      Yes-brain aneurysm 6. CAUSE: What do you think is causing the headache?     Unsure  7. MIGRAINE: Have you been diagnosed with migraine headaches? If Yes, ask: Is this headache similar?      A long time ago  8. HEAD INJURY: Has there been any recent  injury to your head?      no 9. OTHER SYMPTOMS: Do you have any other symptoms? (e.g., fever, stiff neck, eye pain, sore throat, cold symptoms)     Chronic back pain  Protocols used: Headache-A-AH

## 2024-07-29 NOTE — Progress Notes (Signed)
 Patient ID: Shawn Adams, male    DOB: 1976-06-03  MRN: [id number]  CC: Headache and Medication Refill   Subjective: Shawn Adams is a 48 y.o. male who presents for UC visit. His concerns today include:  Pt with hx sarcoidosis, SS anemia, persistent asthma, CKD  Discussed the use of AI scribe software for clinical note transcription with the patient, who gave verbal consent to proceed.  History of Present Illness Shawn Adams is a 48 year old male with a history of a brain aneurysm who presents with worsening headaches.  He has been experiencing worsening headaches, described as 'crazy, crazy headaches', that began last week. The headaches are located at the back of his head, where his known aneurysm is located. They occur approximately every six hours and last for about 30 to 45 minutes. He finds relief with Tylenol  and oxycodone , which he uses as needed. Associated symptoms include slight light sensitivity and dizziness, but no nausea, vomiting, or blurred vision. He also experiences numbness in the front of his head and occasionally in his leg and arm.  He reports similar symptoms/headaches earlier this year.  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  He has a history of a basilar artery aneurysm, initially diagnosed in 2013 with imaging studies. The aneurysm was last measured at 3 to 4 millimeters in size during an MRI in February of the previous year. He has been followed by a neurologist and a neurosurgeon at [facility], with the last neurology visit in May 2023 for headaches. Last saw neurosurgeon Dr. Rockey Bryant 11/04/22. MRA at that time showed slight increase in aneurysm arising from mid basilar artery.  Surveillance versus treatment at that time was discussed with patient.  Looks like patient opted for continued surveillance. He has hypertension, for which he takes losartan  25 mg daily. He  misplaced his home blood pressure monitor during a move and has not been regularly checking his blood pressure at home.    Patient Active Problem List   Diagnosis Date Noted   Screening examination for STI 08/09/2023   Need for influenza vaccination 06/02/2023   Dental caries 09/05/2022   Need for immunization against influenza 09/05/2022   Chronic abdominal pain 07/08/2022   Chronic bronchitis, unspecified chronic bronchitis type (HCC) 03/18/2021   Cough 12/07/2020   History of COVID-19 12/07/2020   SOB (shortness of breath)    COVID-19 07/11/2020   Chest pain    Abdominal pain    Sepsis due to Escherichia coli (E. coli) (HCC) 04/09/2020   Sepsis with acute renal failure without septic shock (HCC)    Sickle cell anemia with pain (HCC) 04/07/2020   Chronic pain syndrome    Pulmonary sarcoidosis 07/06/2018   Persistent asthma without complication 07/06/2018   Headache 05/03/2016   Anemia of chronic disease 05/03/2016   Headache disorder    Vitamin D  deficiency 02/09/2016   Heart murmur 02/09/2016   Recurrent occipital headache 10/15/2015   History of aneurysm 10/13/2015   Left hip pain 07/30/2015   Left ankle pain 07/08/2015   Sickle cell crisis (HCC) 03/08/2015   Hb-SS disease without crisis (HCC) 01/29/2015   Sickle cell anemia with crisis (HCC) 12/07/2014   Sickle cell anemia (HCC) 06/20/2014   Acute renal failure superimposed on stage 3b chronic kidney disease (HCC) 06/20/2014   Leukocytosis 06/20/2014   Sickle cell pain crisis (HCC) 06/20/2014   Hypercalcemia 06/20/2014  Symptomatic anemia 05/31/2014   Acute renal failure superimposed on chronic kidney disease 05/31/2014     Current Outpatient Medications on File Prior to Visit  Medication Sig Dispense Refill   acetaminophen  (TYLENOL ) 500 MG tablet Take 500 mg by mouth every 6 (six) hours as needed for moderate pain or fever (pain).      folic acid  (FOLVITE ) 400 MCG tablet Take 400 mcg by mouth daily.      hydroxyurea  (HYDREA ) 500 MG capsule Take 2 capsules (1,000 mg total) by mouth daily. May take with food to minimize GI side effects. 180 capsule 3   Oxycodone  HCl 20 MG TABS Take 1 tablet by mouth daily as needed (For pain).     albuterol  (VENTOLIN  HFA) 108 (90 Base) MCG/ACT inhaler Inhale 1-2 puffs into the lungs every 6 (six) hours as needed for wheezing or shortness of breath. 8 g 2   Ascorbic Acid  (VITAMIN C  PO) Take 1 tablet by mouth daily.     budesonide-formoterol  (SYMBICORT) 160-4.5 MCG/ACT inhaler Inhale 2 puffs into the lungs 2 (two) times daily.     cyanocobalamin  50 MCG tablet Take 100 mcg by mouth daily.     docusate sodium  (COLACE) 100 MG capsule Take 100 mg by mouth daily as needed for mild constipation.     hydroxychloroquine  (PLAQUENIL ) 200 MG tablet Take 200 mg by mouth daily.     losartan  (COZAAR ) 25 MG tablet Take 25 mg by mouth daily.     Melatonin 10 MG TABS Take 10 mg by mouth at bedtime.     ondansetron  (ZOFRAN ) 4 MG tablet Take 4 mg by mouth every 8 (eight) hours as needed for vomiting or nausea.     pantoprazole  (PROTONIX ) 40 MG tablet TAKE 1 TABLET(40 MG) BY MOUTH DAILY-NEEDS APPT FOR FURTHER REFILLS 90 tablet 0   sodium bicarbonate  650 MG tablet Take 650 mg by mouth daily.     tadalafil (CIALIS) 10 MG tablet Take 10 mg by mouth daily as needed for erectile dysfunction.     Vitamin D , Ergocalciferol , (DRISDOL ) 1.25 MG (50000 UNIT) CAPS capsule TAKE 1 CAPSULE BY MOUTH EVERY 7 DAYS (Patient not taking: Reported on 07/04/2024) 5 capsule 0   No current facility-administered medications on file prior to visit.    No Known Allergies  Social History   Socioeconomic History   Marital status: Single    Spouse name: Not on file   Number of children: Not on file   Years of education: Not on file   Highest education level: Not on file  Occupational History   Not on file  Tobacco Use   Smoking status: Never   Smokeless tobacco: Never  Vaping Use   Vaping status: Never  Used  Substance and Sexual Activity   Alcohol use: Not Currently    Alcohol/week: 1.0 standard drink of alcohol    Types: 1 Cans of beer per week    Comment: occasionally   Drug use: Not Currently    Types: Marijuana   Sexual activity: Yes    Birth control/protection: None  Other Topics Concern   Not on file  Social History Narrative   Not on file   Social Drivers of Health   Financial Resource Strain: Low Risk  (03/28/2024)   Overall Financial Resource Strain (CARDIA)    Difficulty of Paying Living Expenses: Not hard at all  Food Insecurity: Food Insecurity Present (03/28/2024)   Hunger Vital Sign    Worried About Running Out of Food in the  Last Year: Sometimes true    Ran Out of Food in the Last Year: Sometimes true  Transportation Needs: No Transportation Needs (03/28/2024)   PRAPARE - Administrator, Civil Service (Medical): No    Lack of Transportation (Non-Medical): No  Physical Activity: Inactive (03/28/2024)   Exercise Vital Sign    Days of Exercise per Week: 0 days    Minutes of Exercise per Session: 0 min  Stress: No Stress Concern Present (03/28/2024)   [employer] of Occupational Health - Occupational Stress Questionnaire    Feeling of Stress: Not at all  Social Connections: Moderately Integrated (03/28/2024)   Social Connection and Isolation Panel    Frequency of Communication with Friends and Family: More than three times a week    Frequency of Social Gatherings with Friends and Family: Once a week    Attends Religious Services: 1 to 4 times per year    Active Member of [employer] or Organizations: No    Attends Banker Meetings: Never    Marital Status: Married  Catering Manager Violence: Not At Risk (03/28/2024)   Humiliation, Afraid, Rape, and Kick questionnaire    Fear of Current or Ex-Partner: No    Emotionally Abused: No    Physically Abused: No    Sexually Abused: No    Family History  Problem Relation Age of Onset   Diabetes Mother     Hypertension Father    Lupus Sister    Colon cancer Neg Hx    Esophageal cancer Neg Hx    Stomach cancer Neg Hx    Rectal cancer Neg Hx     Past Surgical History:  Procedure Laterality Date   LYMPH NODE BIOPSY     NASAL SINUS SURGERY      ROS: Review of Systems Negative except as stated above  PHYSICAL EXAM: BP (!) 150/77   Pulse 78   Wt 137 lb (62.1 kg)   SpO2 95%   BMI 18.58 kg/m   Physical Exam   General appearance - alert, well appearing, and in no distress Mental status - normal mood, behavior, speech, dress, motor activity, and thought processes Chest - clear to auscultation, no wheezes, rales or rhonchi, symmetric air entry Heart - normal rate, regular rhythm, normal S1, S2, no murmurs, rubs, clicks or gallops Neurological - cranial nerves II through XII intact, motor and sensory grossly normal bilaterally, Romberg sign negative, normal gait and station.     Latest Ref Rng & Units 07/04/2024    2:25 PM 04/03/2024    2:09 PM 03/01/2024    6:00 AM  CMP  Glucose 70 - 99 mg/dL 86  890  896   BUN 6 - 24 mg/dL 26  44  46   Creatinine 0.76 - 1.27 mg/dL 7.45  7.36  7.75   Sodium 134 - 144 mmol/L 139  138  136   Potassium 3.5 - 5.2 mmol/L 3.9  4.1  3.8   Chloride 96 - 106 mmol/L 107  111  108   CO2 20 - 29 mmol/L 18  CANCELED  19   Calcium 8.7 - 10.2 mg/dL 89.9  9.3  89.9   Total Protein 6.0 - 8.5 g/dL 7.0  6.5  7.0   Total Bilirubin 0.0 - 1.2 mg/dL 1.2  1.1  1.7   Alkaline Phos 47 - 123 IU/L 116  78  90   AST 0 - 40 IU/L 37  23  30   ALT 0 -  44 IU/L 23  15  20     Lipid Panel     Component Value Date/Time   TRIG 182 (H) 07/11/2020 0315    CBC    Component Value Date/Time   WBC 4.4 07/04/2024 1425   WBC 8.3 03/01/2024 0458   RBC 2.05 (LL) 07/04/2024 1425   RBC 1.75 (L) 03/01/2024 0458   RBC 1.83 (L) 03/01/2024 0458   HGB 6.5 (LL) 07/04/2024 1425   HCT 20.1 (L) 07/04/2024 1425   PLT 394 07/04/2024 1425   MCV 98 (H) 07/04/2024 1425   MCH 31.7  07/04/2024 1425   MCH 34.3 (H) 03/01/2024 0458   MCHC 32.3 07/04/2024 1425   MCHC 35.7 03/01/2024 0458   RDW 20.2 (H) 07/04/2024 1425   LYMPHSABS 1.6 07/04/2024 1425   MONOABS 0.5 03/01/2024 0458   EOSABS 0.2 07/04/2024 1425   BASOSABS 0.1 07/04/2024 1425    ASSESSMENT AND PLAN: 1. Headache disorder (Primary) 2. Basilar artery aneurysm We will try to get him back in with the neurology and neurosurgeon at [facility] as soon as possible in light of increased headaches.  He is due for repeat imaging to assess size of the aneurysm.  Patient states that he has an appointment with his nephrologist at [facility] later this week.  He states that he will ask his nephrologist to help expedite his appointment with the neurology and neurosurgeon. Advised immediate medical attention for thunderclap headache indicating possible rupture. Needs better BP control. Norvasc added.  - Ambulatory referral to Neurosurgery - Ambulatory referral to Neurology  3. Essential hypertension Not at goal. Continue Cozaar  25 mg daily. Add Norvasc 5 mg daily. Patient to purchase a home blood pressure monitoring device so that he can monitor blood pressure at least once a week.  Goal is 130/80 or lower. - amLODipine (NORVASC) 5 MG tablet; Take 1 tablet (5 mg total) by mouth daily.  Dispense: 30 tablet; Refill: 3   Patient was given the opportunity to ask questions.  Patient verbalized understanding of the plan and was able to repeat key elements of the plan.   I spent 34 minutes dedicated to the care of this patient today to include previsit review of chart including last note from his PCP, review of notes and imaging studies from [facility], face-to-face time with patient discussing diagnosis and management and post visit entering of orders.  This documentation was completed using Paediatric nurse.  Any transcriptional errors are unintentional.  Orders Placed This  Encounter  Procedures   Ambulatory referral to Neurosurgery   Ambulatory referral to Neurology     Requested Prescriptions   Signed Prescriptions Disp Refills   amLODipine (NORVASC) 5 MG tablet 30 tablet 3    Sig: Take 1 tablet (5 mg total) by mouth daily.    Return if symptoms worsen or fail to improve.  Barnie Louder, MD, FACP

## 2024-07-29 NOTE — Patient Instructions (Signed)
  VISIT SUMMARY: Today, we discussed your worsening headaches, your history of a brain aneurysm, and your blood pressure management. We also reviewed your chronic kidney disease and the importance of managing your blood pressure to prevent further kidney damage.  YOUR PLAN: -BASILAR ARTERY ANEURYSM: A basilar artery aneurysm is a bulge in one of the arteries at the base of your brain. Given the increased frequency of your headaches, we are concerned about possible changes in the aneurysm. You will be referred to Dr. Rockey Morocco, a neurosurgeon, and Dr. Sherida Danker, a neurologist, at [facility] for further evaluation and management. If you experience a sudden, severe headache, seek immediate medical attention as it could indicate a rupture.  -CHRONIC HEADACHES: Chronic headaches are persistent headaches that occur frequently. Your headaches have worsened recently and are located at the back of your head. We will refer you to a neurologist for further evaluation and management.  -HYPERTENSION, UNCONTROLLED: Hypertension is high blood pressure. Your recent blood pressure readings are higher than the target goal of 130/80. We are prescribing amlodipine 5 mg once daily to help control your blood pressure. Please purchase a home blood pressure monitor to regularly track your readings.   INSTRUCTIONS: Please follow up with Dr. Rockey Morocco and Dr. Sherida Danker at [facility] for your aneurysm and headaches. Additionally, schedule an appointment with a neurologist for your chronic headaches. Start taking amlodipine 5 mg once daily for your blood pressure and purchase a home blood pressure monitor to track your readings regularly. Continue to work with your nephrologist to manage your chronic kidney disease.                      Contains text generated by Abridge.                                 Contains text generated by Abridge.

## 2024-07-30 ENCOUNTER — Encounter (HOSPITAL_COMMUNITY): Payer: Self-pay

## 2024-07-30 ENCOUNTER — Emergency Department (HOSPITAL_COMMUNITY)
Admission: EM | Admit: 2024-07-30 | Discharge: 2024-07-31 | Disposition: A | Discharge status: Home / Self Care | Attending: Emergency Medicine | Admitting: Emergency Medicine

## 2024-07-30 ENCOUNTER — Other Ambulatory Visit: Payer: Self-pay

## 2024-07-30 DIAGNOSIS — R519 Headache, unspecified: Secondary | ICD-10-CM | POA: Diagnosis not present

## 2024-07-30 LAB — CBC WITH DIFFERENTIAL/PLATELET
Abs Immature Granulocytes: 0.03 K/uL (ref 0.00–0.07)
Basophils Absolute: 0.1 K/uL (ref 0.0–0.1)
Basophils Relative: 1 %
Eosinophils Absolute: 0.2 K/uL (ref 0.0–0.5)
Eosinophils Relative: 4 %
HCT: 22.4 % — ABNORMAL LOW (ref 39.0–52.0)
Hemoglobin: 7.4 g/dL — ABNORMAL LOW (ref 13.0–17.0)
Immature Granulocytes: 1 %
Lymphocytes Relative: 35 %
Lymphs Abs: 1.8 K/uL (ref 0.7–4.0)
MCH: 31.9 pg (ref 26.0–34.0)
MCHC: 33 g/dL (ref 30.0–36.0)
MCV: 96.6 fL (ref 80.0–100.0)
Monocytes Absolute: 0.5 K/uL (ref 0.1–1.0)
Monocytes Relative: 9 %
Neutro Abs: 2.6 K/uL (ref 1.7–7.7)
Neutrophils Relative %: 50 %
Platelets: 413 K/uL — ABNORMAL HIGH (ref 150–400)
RBC: 2.32 MIL/uL — ABNORMAL LOW (ref 4.22–5.81)
RDW: 20 % — ABNORMAL HIGH (ref 11.5–15.5)
WBC: 5.2 K/uL (ref 4.0–10.5)
nRBC: 2.9 % — ABNORMAL HIGH (ref 0.0–0.2)

## 2024-07-30 LAB — MAGNESIUM: Magnesium: 2 mg/dL (ref 1.7–2.4)

## 2024-07-30 LAB — BASIC METABOLIC PANEL WITH GFR
Anion gap: 7 (ref 5–15)
BUN: 23 mg/dL — ABNORMAL HIGH (ref 6–20)
CO2: 25 mmol/L (ref 22–32)
Calcium: 10 mg/dL (ref 8.9–10.3)
Chloride: 106 mmol/L (ref 98–111)
Creatinine, Ser: 2.6 mg/dL — ABNORMAL HIGH (ref 0.61–1.24)
GFR, Estimated: 29 mL/min — ABNORMAL LOW (ref >60)
Glucose, Bld: 89 mg/dL (ref 70–99)
Potassium: 3.9 mmol/L (ref 3.5–5.1)
Sodium: 138 mmol/L (ref 135–145)

## 2024-07-30 MED ORDER — MORPHINE SULFATE (PF) 4 MG/ML IV SOLN
INTRAVENOUS | Status: AC
  Filled 2024-07-30: qty 1

## 2024-07-30 MED ORDER — ONDANSETRON HCL 4 MG/2ML IJ SOLN
INTRAMUSCULAR | Status: AC
  Filled 2024-07-30: qty 2

## 2024-07-30 MED ORDER — ONDANSETRON HCL 4 MG/2ML IJ SOLN
4.0000 mg | Freq: Once | INTRAMUSCULAR | Status: AC
Start: 2024-07-30 — End: 2024-07-30
  Administered 2024-07-30: 4 mg via INTRAVENOUS

## 2024-07-30 MED ORDER — MORPHINE SULFATE (PF) 4 MG/ML IV SOLN
4.0000 mg | Freq: Once | INTRAVENOUS | Status: AC
  Administered 2024-07-30: 4 mg via INTRAVENOUS

## 2024-07-30 NOTE — Telephone Encounter (Signed)
 Pt was seen by Dr. Vicci yesterday. Kh

## 2024-07-30 NOTE — ED Provider Triage Note (Signed)
 Emergency Medicine Provider Triage Evaluation Note  Shawn Adams , a 48 y.o. male  was evaluated in triage.  Pt complains of intermittent HA over past week. No light sensitivity nor loss of vision. Tylenol  and ibuprofen  improve symptoms. Hx of aneurysms. Last saw [facility] neuro in 2024   Review of Systems  Positive: See hpi Negative: Fevers, neck pain  Physical Exam  BP (!) 169/83 (BP Location: Left Arm)   Pulse 89   Temp 98.1 F (36.7 C) (Oral)   Resp 18   Ht 6' (1.829 m)   Wt 62.1 kg   SpO2 95%   BMI 18.58 kg/m  Gen:   Awake, no distress   Resp:  Normal effort  MSK:   Moves extremities without difficulty  Other:    Medical Decision Making  Medically screening exam initiated at 6:49 PM.  Appropriate orders placed.  Shawn Adams was informed that the remainder of the evaluation will be completed by another provider, this initial triage assessment does not replace that evaluation, and the importance of remaining in the ED until their evaluation is complete.  Labs and CTA ordered   Shawn Adams, [place] 07/30/24 8143

## 2024-07-30 NOTE — ED Provider Notes (Signed)
 Shawn Adams Provider Note   CSN: [id number] Arrival date & time: 07/30/24  1718     Patient presents with: Headache   Shawn Adams is a 48 y.o. male.   HPI   48 year old male with past medical history of basilar artery aneurysm, follows with neurosurgery at [facility] presents emergency department with headache.  Patient states he was following with neurosurgery as an outpatient for frequent headaches.  Outpatient plan was for MRI imaging every 2 years for measuring/monitoring of aneurysm.  However he developed a headache over the past week, worsened over the past couple days.  Denies any focal neurologic deficit.  Denies any fever or neck stiffness.  Does have history of sickle cell and sarcoid that he is currently under treatment for.  Prior to Admission medications   Medication Sig Start Date End Date Taking? Authorizing Provider  acetaminophen  (TYLENOL ) 500 MG tablet Take 500 mg by mouth every 6 (six) hours as needed for moderate pain or fever (pain).     [provider]  albuterol  (VENTOLIN  HFA) 108 (90 Base) MCG/ACT inhaler Inhale 1-2 puffs into the lungs every 6 (six) hours as needed for wheezing or shortness of breath. 08/09/23   Nichols, Tonya S, NP  amLODipine (NORVASC) 5 MG tablet Take 1 tablet (5 mg total) by mouth daily. 07/29/24   Vicci Barnie NOVAK, MD  Ascorbic Acid  (VITAMIN C  PO) Take 1 tablet by mouth daily.    [provider]  budesonide-formoterol  (SYMBICORT) 160-4.5 MCG/ACT inhaler Inhale 2 puffs into the lungs 2 (two) times daily. 05/10/18   [provider]  cyanocobalamin  50 MCG tablet Take 100 mcg by mouth daily.    [provider]  docusate sodium  (COLACE) 100 MG capsule Take 100 mg by mouth daily as needed for mild constipation.    [provider]  folic acid  (FOLVITE ) 400 MCG tablet Take 400 mcg by mouth daily.    [provider]  hydroxychloroquine  (PLAQUENIL ) 200 MG  tablet Take 200 mg by mouth daily. 01/13/20   [provider]  hydroxyurea  (HYDREA ) 500 MG capsule Take 2 capsules (1,000 mg total) by mouth daily. May take with food to minimize GI side effects. 02/09/16   [name], [name], MD  losartan  (COZAAR ) 25 MG tablet Take 25 mg by mouth daily.    [provider]  Melatonin 10 MG TABS Take 10 mg by mouth at bedtime.    [provider]  ondansetron  (ZOFRAN ) 4 MG tablet Take 4 mg by mouth every 8 (eight) hours as needed for vomiting or nausea. 11/22/21   [provider]  Oxycodone  HCl 20 MG TABS Take 1 tablet by mouth daily as needed (For pain). 08/23/22   [provider]  pantoprazole  (PROTONIX ) 40 MG tablet TAKE 1 TABLET(40 MG) BY MOUTH DAILY-NEEDS APPT FOR FURTHER REFILLS 12/25/23   Pyrtle, [name], MD  sodium bicarbonate  650 MG tablet Take 650 mg by mouth daily. 10/14/19   [provider]  tadalafil (CIALIS) 10 MG tablet Take 10 mg by mouth daily as needed for erectile dysfunction.    [provider]  Vitamin D , Ergocalciferol , (DRISDOL ) 1.25 MG (50000 UNIT) CAPS capsule TAKE 1 CAPSULE BY MOUTH EVERY 7 DAYS Patient not taking: Reported on 07/04/2024 11/28/22   Nichols, Tonya S, NP    Allergies: Patient has no known allergies.    Review of Systems  Constitutional:  Negative for fever.  Eyes:  Negative for visual disturbance.  Respiratory:  Negative for shortness of breath.   Cardiovascular:  Negative for chest pain.  Gastrointestinal:  Negative for abdominal pain, diarrhea and vomiting.  Musculoskeletal:  Negative for neck stiffness.  Skin:  Negative for rash.  Neurological:  Positive for headaches. Negative for dizziness, facial asymmetry, speech difficulty and weakness.    Updated Vital Signs BP 105/79 (BP Location: Right Arm)   Pulse 70   Temp 98.2 F (36.8 C) (Oral)   Resp 18   Ht 6' (1.829 m)   Wt 62.1 kg   SpO2 100%   BMI 18.58 kg/m   Physical Exam Vitals and nursing note  reviewed.  Constitutional:      General: He is not in acute distress.    Appearance: Normal appearance.  HENT:     Head: Normocephalic.     Mouth/Throat:     Mouth: Mucous membranes are moist.  Eyes:     General: No visual field deficit.    Extraocular Movements: Extraocular movements intact.     Pupils: Pupils are equal, round, and reactive to light.  Cardiovascular:     Rate and Rhythm: Normal rate.  Pulmonary:     Effort: Pulmonary effort is normal. No respiratory distress.  Abdominal:     Palpations: Abdomen is soft.     Tenderness: There is no abdominal tenderness.  Musculoskeletal:     Cervical back: Neck supple.  Skin:    General: Skin is warm.  Neurological:     Mental Status: He is alert and oriented to person, place, and time. Mental status is at baseline.     Cranial Nerves: No cranial nerve deficit, dysarthria or facial asymmetry.  Psychiatric:        Mood and Affect: Mood normal.     (all labs ordered are listed, but only abnormal results are displayed) Labs Reviewed  BASIC METABOLIC PANEL WITH GFR - Abnormal; Notable for the following components:      Result Value   BUN 23 (*)    Creatinine, Ser 2.60 (*)    GFR, Estimated 29 (*)    All other components within normal limits  CBC WITH DIFFERENTIAL/PLATELET - Abnormal; Notable for the following components:   RBC 2.32 (*)    Hemoglobin 7.4 (*)    HCT 22.4 (*)    RDW 20.0 (*)    Platelets 413 (*)    nRBC 2.9 (*)    All other components within normal limits  MAGNESIUM  CBC WITH DIFFERENTIAL/PLATELET    EKG: None  Radiology: No results found.   Procedures   Medications Ordered in the ED  morphine  (PF) 4 MG/ML injection 4 mg (4 mg Intravenous Given 07/30/24 2159)  ondansetron  (ZOFRAN ) injection 4 mg (4 mg Intravenous Given 07/30/24 2159)                                    Medical Decision Making Amount and/or Complexity of Data Reviewed Radiology: ordered.  Risk Prescription drug  management.   48 year old male presents emergency department with ongoing headache.  History of basilar artery aneurysm, follows with neurosurgery Dr. Fargen at [facility].  Last seen February/2024 with an MRA.  Plan for outpatient evaluation/imaging every 2 years for monitoring.  Presents now with worsening headache.  No associated neurosymptoms.  No fever or neck stiffness.  Vitals are normal stable on arrival, he is neuro baseline.  Blood work is baseline for the patient with ongoing AKI.  Not suitable for CT contrast.  Patient will be treated symptomatically for headache and will require emergent transfer to [facility], ER for MRI imaging and further evaluation of aneurysm.  Recommended transfer by transport.  Dr. Patt is excepting.  Patient stable at time of decision to transfer.     Final diagnoses:  None    ED Discharge Orders     None          Bari Roxie HERO, DO 07/30/24 2315

## 2024-07-30 NOTE — ED Triage Notes (Signed)
 Pt to er, pt states that he has a hx of brain aneurism, states that he hasn't had it checked in the past two years, states that he is here for a headache.  States that he has had the headache off and on for the past week, denies vomiting.

## 2024-07-30 NOTE — ED Notes (Signed)
 [facility] called.

## 2024-07-31 ENCOUNTER — Emergency Department (HOSPITAL_COMMUNITY)

## 2024-07-31 ENCOUNTER — Telehealth: Payer: Self-pay | Admitting: Internal Medicine

## 2024-07-31 DIAGNOSIS — D869 Sarcoidosis, unspecified: Secondary | ICD-10-CM | POA: Diagnosis not present

## 2024-07-31 DIAGNOSIS — I671 Cerebral aneurysm, nonruptured: Secondary | ICD-10-CM | POA: Diagnosis not present

## 2024-07-31 DIAGNOSIS — J4550 Severe persistent asthma, uncomplicated: Secondary | ICD-10-CM | POA: Diagnosis not present

## 2024-07-31 DIAGNOSIS — Z743 Need for continuous supervision: Secondary | ICD-10-CM | POA: Diagnosis not present

## 2024-07-31 DIAGNOSIS — R519 Headache, unspecified: Secondary | ICD-10-CM | POA: Diagnosis not present

## 2024-07-31 DIAGNOSIS — I10 Essential (primary) hypertension: Secondary | ICD-10-CM | POA: Diagnosis not present

## 2024-07-31 DIAGNOSIS — G4489 Other headache syndrome: Secondary | ICD-10-CM | POA: Diagnosis not present

## 2024-07-31 MED ORDER — GADOBUTROL 1 MMOL/ML IV SOLN
6.0000 mL | Freq: Once | INTRAVENOUS | Status: AC | PRN
  Administered 2024-07-31: 6 mL via INTRAVENOUS

## 2024-07-31 NOTE — Telephone Encounter (Signed)
-----   Message from Altamese RAMAN sent at 07/30/2024 12:28 PM EST ----- Regarding: RE: Neurology and Neurosurgeon referrals Good Afternoon  Noted  Referrals faxed to  [facility] ----- Message ----- From: Vicci Barnie NOVAK, MD Sent: 07/29/2024   5:19 PM EST To: Altamese FORBES Adler Subject: Neurology and Neurosurgeon referrals           Please try to get him in at [facility] ASAP.

## 2024-07-31 NOTE — Discharge Instructions (Signed)
 The MRI that we performed does not show any new problems.  Please follow-up with your neurosurgeon.

## 2024-07-31 NOTE — ED Notes (Signed)
 Pt BIB [facility], hx of aneurysm and sickle cell, 7/10 headache . PT transferred from [facility] to get MRI at [facility].  Vitals: 164/86 manual BP HR 66 O2 93%

## 2024-07-31 NOTE — ED Notes (Signed)
 Per Dr Nels request, Spoke with Bernardino in MRI, who will push MRA images to [facility], where he is followed

## 2024-07-31 NOTE — ED Notes (Signed)
 [facility] took patient to [facility].

## 2024-08-07 DIAGNOSIS — R519 Headache, unspecified: Secondary | ICD-10-CM | POA: Diagnosis not present

## 2024-08-08 ENCOUNTER — Other Ambulatory Visit: Payer: Self-pay | Admitting: Nurse Practitioner

## 2024-08-11 ENCOUNTER — Telehealth: Payer: Self-pay | Admitting: Internal Medicine

## 2024-08-11 NOTE — Telephone Encounter (Signed)
-----   Message from Altamese RAMAN sent at 08/09/2024  9:59 AM EST ----- Regarding: RE: Neurology and Neurosurgeon referrals Gm  both referrals  was sent  to  [facility] on  11/4 ----- Message ----- From: Vicci Barnie NOVAK, MD Sent: 07/29/2024   5:19 PM EST To: Altamese FORBES Adler Subject: Neurology and Neurosurgeon referrals           Please try to get him in at [facility] ASAP.

## 2024-08-16 DIAGNOSIS — D5700 Hb-SS disease with crisis, unspecified: Secondary | ICD-10-CM | POA: Diagnosis not present

## 2024-08-16 DIAGNOSIS — D571 Sickle-cell disease without crisis: Secondary | ICD-10-CM | POA: Diagnosis not present

## 2024-08-16 DIAGNOSIS — N1832 Chronic kidney disease, stage 3b: Secondary | ICD-10-CM | POA: Diagnosis not present

## 2024-09-18 ENCOUNTER — Other Ambulatory Visit: Payer: Self-pay | Admitting: Internal Medicine

## 2024-10-04 ENCOUNTER — Ambulatory Visit: Payer: Self-pay | Admitting: Nurse Practitioner

## 2024-10-23 ENCOUNTER — Ambulatory Visit: Payer: Self-pay | Admitting: Nurse Practitioner

## 2024-10-23 DIAGNOSIS — I10 Essential (primary) hypertension: Secondary | ICD-10-CM

## 2024-11-21 ENCOUNTER — Ambulatory Visit: Admitting: Nurse Practitioner

## 2024-12-02 ENCOUNTER — Encounter: Payer: Self-pay | Admitting: Nurse Practitioner
